# Patient Record
Sex: Male | Born: 1935 | Race: White | Hispanic: No | Smoking: Former smoker
Health system: Southern US, Community
[De-identification: ages and names within clinical notes are randomized; demographics above are authoritative.]

## PROBLEM LIST (undated history)

## (undated) DIAGNOSIS — F0390 Unspecified dementia without behavioral disturbance: Secondary | ICD-10-CM

## (undated) DIAGNOSIS — J449 Chronic obstructive pulmonary disease, unspecified: Secondary | ICD-10-CM

## (undated) DIAGNOSIS — I350 Nonrheumatic aortic (valve) stenosis: Secondary | ICD-10-CM

## (undated) DIAGNOSIS — N400 Enlarged prostate without lower urinary tract symptoms: Secondary | ICD-10-CM

## (undated) DIAGNOSIS — I4819 Other persistent atrial fibrillation: Secondary | ICD-10-CM

## (undated) DIAGNOSIS — H9190 Unspecified hearing loss, unspecified ear: Secondary | ICD-10-CM

## (undated) DIAGNOSIS — I10 Essential (primary) hypertension: Secondary | ICD-10-CM

## (undated) DIAGNOSIS — I4891 Unspecified atrial fibrillation: Secondary | ICD-10-CM

## (undated) HISTORY — PX: OTHER SURGICAL HISTORY: SHX169

## (undated) HISTORY — DX: Unspecified atrial fibrillation: I48.91

## (undated) HISTORY — DX: Nonrheumatic aortic (valve) stenosis: I35.0

## (undated) HISTORY — DX: Chronic obstructive pulmonary disease, unspecified: J44.9

## (undated) HISTORY — PX: TONSILLECTOMY: SUR1361

## (undated) HISTORY — DX: Unspecified hearing loss, unspecified ear: H91.90

---

## 2006-10-10 HISTORY — PX: CARDIOVERSION: SHX1299

## 2007-03-14 ENCOUNTER — Ambulatory Visit: Admission: RE | Admit: 2007-03-14 | Discharge: 2007-03-14 | Payer: Self-pay | Admitting: Otolaryngology

## 2007-04-18 ENCOUNTER — Ambulatory Visit (HOSPITAL_COMMUNITY): Admission: RE | Admit: 2007-04-18 | Discharge: 2007-04-18 | Payer: Self-pay | Admitting: Cardiology

## 2011-02-22 NOTE — Op Note (Signed)
NAMESAURABH, HETTICH NO.:  [id number]   MEDICAL RECORD NO.:  [id number]          PATIENT TYPE:  OIB   LOCATION:  2858                         FACILITY:  [facility]   PHYSICIAN:  Cristy Hilts. Jacinto Halim, MD       DATE OF BIRTH:  1935-05-17   DATE OF PROCEDURE:  04/18/2007  DATE OF DISCHARGE:                               OPERATIVE REPORT   PROCEDURE PERFORMED:  Direct current cardioversion.   INDICATION:  Mr. Gertie Baron is a 76 year old gentleman with history of  atrial fibrillation of unknown duration.  This was new diagnosis to this  gentleman.  History of hyperlipidemia who was has been ruled out for  myocardial ischemia by a stress test.  He has normal ejection fraction.  He was brought to the outpatient center for elective direct current  cardioversion after greater than 4 weeks of therapeutic INR.   PROCEDURE:  Using the 350 mg of intravenous Pentothal attaining deep  sedation, direct current cardioversion was performed x4.  Initially a  synchronized biphasic defibrillator was utilized delivering 100 joules  x1, and 20 joules x2, then 150 joules x1.  It was unsuccessful in spite  of these four attempts.  Hence, the patient was left alone in the atrial  fibrillation.  The patient tolerated the procedure.  No evident  complications.      Cristy Hilts. Jacinto Halim, MD  Electronically Signed     JRG/MEDQ  D:  04/18/2007  T:  04/18/2007  Job:  [id number]   cc:   Aida Puffer

## 2012-07-27 ENCOUNTER — Ambulatory Visit (INDEPENDENT_AMBULATORY_CARE_PROVIDER_SITE_OTHER): Payer: MEDICARE | Admitting: General Surgery

## 2012-07-27 ENCOUNTER — Other Ambulatory Visit (INDEPENDENT_AMBULATORY_CARE_PROVIDER_SITE_OTHER): Payer: Self-pay

## 2012-07-27 ENCOUNTER — Encounter (INDEPENDENT_AMBULATORY_CARE_PROVIDER_SITE_OTHER): Payer: Self-pay | Admitting: General Surgery

## 2012-07-27 VITALS — BP 118/82 | HR 72 | Temp 97.2°F | Resp 12 | Ht 72.0 in | Wt 236.0 lb

## 2012-07-27 DIAGNOSIS — K429 Umbilical hernia without obstruction or gangrene: Secondary | ICD-10-CM

## 2012-07-27 DIAGNOSIS — I4821 Permanent atrial fibrillation: Secondary | ICD-10-CM | POA: Insufficient documentation

## 2012-07-27 DIAGNOSIS — I4891 Unspecified atrial fibrillation: Secondary | ICD-10-CM

## 2012-07-27 HISTORY — DX: Umbilical hernia without obstruction or gangrene: K42.9

## 2012-07-27 NOTE — Patient Instructions (Addendum)
Scheduling will contact you once we receive clearance for surgery

## 2012-07-27 NOTE — Progress Notes (Signed)
Patient ID: Warren Knock., male   DOB: 1935/07/08, 77 y.o.   MRN: [id number]  Chief Complaint  Patient presents with  . Umbilical Hernia    HPI Warren Camino. is a 77 y.o. male.  Chief complaint umbilical hernia HPI Patient has a many year history of umbilical hernia.  Recently, it has begun to bother him.  He has had episodes where he has increased pain and firmness at the site.  Once, he had an episode of vomiting. He is usually able to push it back in throughout the day.  No other recent complaints. Past Medical History  Diagnosis Date  . Atrial fibrillation     No past surgical history on file.  No family history on file.  Social History History  Substance Use Topics  . Smoking status: Not on file  . Smokeless tobacco: Not on file  . Alcohol Use: Not on file    No Known Allergies  Current Outpatient Prescriptions  Medication Sig Dispense Refill  . warfarin (COUMADIN) 5 MG tablet 5 mg daily.         Review of Systems Review of Systems  Constitutional: Negative for fever, chills and unexpected weight change.  HENT: Negative for hearing loss, congestion, sore throat, trouble swallowing and voice change.   Eyes: Negative for visual disturbance.  Respiratory: Negative for cough and wheezing.   Cardiovascular: Negative for chest pain, palpitations and leg swelling.       Atrial fibrillation  Gastrointestinal: Positive for nausea, vomiting and abdominal pain. Negative for diarrhea, constipation, blood in stool, abdominal distention, anal bleeding and rectal pain.       See HPI  Genitourinary: Negative for hematuria and difficulty urinating.  Musculoskeletal: Negative for arthralgias.  Skin: Negative for rash and wound.  Neurological: Negative for seizures, syncope, weakness and headaches.  Hematological: Negative for adenopathy. Does not bruise/bleed easily.  Psychiatric/Behavioral: Negative for confusion.    Blood pressure 118/82, pulse 72, temperature  97.2 F (36.2 C), temperature source Oral, resp. rate 12, height 6' (1.829 m), weight 236 lb (107.049 kg).  Physical Exam Physical Exam  Constitutional: He is oriented to person, place, and time. He appears well-developed and well-nourished.  HENT:  Head: Normocephalic and atraumatic.  Mouth/Throat: No oropharyngeal exudate.  Eyes: Pupils are equal, round, and reactive to light. No scleral icterus.  Neck: Normal range of motion. Neck supple. No tracheal deviation present.  Cardiovascular: Normal heart sounds.   No murmur heard.      Irregular irregular rhythm  Pulmonary/Chest: Effort normal and breath sounds normal. No stridor. No respiratory distress. He has no wheezes. He has no rales.  Abdominal: Soft. Bowel sounds are normal. He exhibits no distension. There is no tenderness. There is no rebound.       Large umbilical hernia extends above umbilicus, reduces without pain, spontaneously recurs  Musculoskeletal: Normal range of motion.  Neurological: He is alert and oriented to person, place, and time.  Skin: Skin is warm.    Data Reviewed N/A  Assessment    Large umbilical hernia, increasingly symptomatic    Plan    I have offered umbilical hernia repair with mesh. Procedure, risks, and benefits D/W patient.  He is agreeable.  He will need to stop his Coumadin for 5 days prior to surgery.  We need clearance to do that from Dr. Clarene Duke, his primary care MD.  Dr. Jacinto Halim is his cardiologist but Dr. Clarene Duke is following his coumadin.  He is agreeable and  I answered his questions.       Warren Wagner E 07/27/2012, 8:42 AM

## 2012-07-30 ENCOUNTER — Telehealth (INDEPENDENT_AMBULATORY_CARE_PROVIDER_SITE_OTHER): Payer: Self-pay

## 2012-07-30 NOTE — Telephone Encounter (Signed)
I let the pt know Dr Clarene Duke gave the ok to stop his Coumadin 5 days prior to surgery.  I notified Dr Janee Morn to make sure he is ok to schedule.

## 2012-07-31 ENCOUNTER — Encounter (INDEPENDENT_AMBULATORY_CARE_PROVIDER_SITE_OTHER): Payer: Self-pay

## 2012-08-02 ENCOUNTER — Encounter (HOSPITAL_COMMUNITY): Payer: Self-pay | Admitting: Pharmacy Technician

## 2012-08-13 ENCOUNTER — Encounter (HOSPITAL_COMMUNITY): Admission: RE | Admit: 2012-08-13 | Payer: Medicare Other | Source: Ambulatory Visit

## 2012-08-17 ENCOUNTER — Encounter (HOSPITAL_COMMUNITY): Admission: RE | Payer: Self-pay | Source: Ambulatory Visit

## 2012-08-17 ENCOUNTER — Ambulatory Visit (HOSPITAL_COMMUNITY): Admission: RE | Admit: 2012-08-17 | Payer: Medicare Other | Source: Ambulatory Visit | Admitting: General Surgery

## 2012-08-17 SURGERY — REPAIR, HERNIA, UMBILICAL, ADULT
Anesthesia: General

## 2012-08-31 ENCOUNTER — Encounter (HOSPITAL_COMMUNITY): Payer: Self-pay

## 2012-08-31 ENCOUNTER — Ambulatory Visit (HOSPITAL_COMMUNITY)
Admission: RE | Admit: 2012-08-31 | Discharge: 2012-08-31 | Disposition: A | Discharge status: Home / Self Care | Payer: Medicare Other | Source: Ambulatory Visit | Attending: Anesthesiology | Admitting: Anesthesiology

## 2012-08-31 ENCOUNTER — Encounter (HOSPITAL_COMMUNITY)
Admission: RE | Admit: 2012-08-31 | Discharge: 2012-08-31 | Disposition: A | Discharge status: Home / Self Care | Payer: Medicare Other | Source: Ambulatory Visit | Attending: General Surgery | Admitting: General Surgery

## 2012-08-31 DIAGNOSIS — Z01818 Encounter for other preprocedural examination: Secondary | ICD-10-CM | POA: Insufficient documentation

## 2012-08-31 LAB — BASIC METABOLIC PANEL
CO2: 29 mEq/L (ref 19–32)
Chloride: 102 mEq/L (ref 96–112)
GFR calc Af Amer: 79 mL/min — ABNORMAL LOW (ref >90)
Potassium: 4.7 mEq/L (ref 3.5–5.1)
Sodium: 141 mEq/L (ref 135–145)

## 2012-08-31 LAB — CBC
HCT: 49.3 % (ref 39.0–52.0)
Hemoglobin: 16.6 g/dL (ref 13.0–17.0)
RBC: 5.51 MIL/uL (ref 4.22–5.81)
RDW: 12.6 % (ref 11.5–15.5)
WBC: 9.2 10*3/uL (ref 4.0–10.5)

## 2012-08-31 LAB — APTT: aPTT: 25 seconds (ref 24–37)

## 2012-08-31 LAB — PROTIME-INR: INR: 0.95 (ref 0.00–1.49)

## 2012-08-31 LAB — SURGICAL PCR SCREEN
MRSA, PCR: NEGATIVE
Staphylococcus aureus: NEGATIVE

## 2012-08-31 MED ORDER — CHLORHEXIDINE GLUCONATE 4 % EX LIQD: 1.0000 "application " | Freq: Once | CUTANEOUS | Status: DC

## 2012-08-31 NOTE — Progress Notes (Addendum)
Note from dr Jacinto Halim from 08 cardioversion. Not seen bry dr Jacinto Halim since. Followed by dr Fayrene Fearing little with respects to coumadin. Clearance by dr little in epic to stop coumadin 5 days prior to surgery. Chart given to anesthesia to review

## 2012-08-31 NOTE — Pre-Procedure Instructions (Addendum)
20 Warren Wagner.  08/31/2012   Your procedure is scheduled on:  09/10/12  Report to [facility] at 530 AM.  Call this number if you have problems the morning of surgery: [phone]   Remember:   Do not eat foodor drink:After Midnight.    Take these medicines the morning of surgery with A SIP OF WATER STOP coumadin per dr ,fish oil, multi vit 11.25.13   Do not wear jewelry,   Do not wear lotions, powders, or perfumes. You may not wear deodorant.  Do not shave 48 hours prior to surgery. Men may shave face and neck.  Do not bring valuables to the hospital.  Contacts, dentures or bridgework may not be worn into surgery.  Leave suitcase in the car. After surgery it may be brought to your room.  For patients admitted to the hospital, checkout time is 11:00 AM the day of discharge.   Patients discharged the day of surgery will not be allowed to drive home.  Name and phone number of your driver:  Tamera Reason [phone]  Special Instructions: Shower using CHG 2 nights before surgery and the night before surgery.  If you shower the day of surgery use CHG.  Use special wash - you have one bottle of CHG for all showers.  You should use approximately 1/3 of the bottle for each shower.   Please read over the following fact sheets that you were given: Pain Booklet, Coughing and Deep Breathing and MRSA Information

## 2012-09-03 NOTE — Consult Note (Signed)
Anesthesia chart review: Patient is a 77 year old male scheduled for repair of a large umbilical hernia by Dr. Janee Morn on 09/10/2012. History includes chronic atrial fibrillation diagnosed in 2008 s/p unsuccessful DCCV (Dr. Jacinto Halim), former smoker, obesity, hearing loss.  PCP is Dr. Aida Puffer.  Patient told his PAT RN that he is no longer followed by a Cardiologist.  Preoperative labs noted.  CXR on 08/31/12 showed: No acute cardiopulmonary disease. Stable mild enlargement of the cardiopericardial silhouette and ill- defined left basilar opacity which may reflect a prominent epicardial fat pad versus chronic atelectasis/scarring.   EKG on 08/31/12 showed afib, occasional PVC, anteroseptal infarct (age undetermined).  Overall, I think his EKG is stable when compared to his EKG on 11/28/11 from [facility].  By Dr. Verl Dicker notes from 04/18/07, patient was diagnosed with afib in 2008.  He subsequently had a work-up that revealed no myocardial ischemia and a normal EF.   Patient's afib is chronic and currently rate controlled, no ischemic cause was found at the time of his diagnosis in 2008.  Patient is now followed by his PCP Dr. Clarene Duke who cleared him for this procedure.  If no significant change in his status then anticipate patient can proceed as planned.    Shonna Chock, PA-C 09/03/12 [phone]

## 2012-09-09 MED ORDER — CEFAZOLIN SODIUM-DEXTROSE 2-3 GM-% IV SOLR
2.0000 g | INTRAVENOUS | Status: AC
  Administered 2012-09-10: 2 g via INTRAVENOUS
  Filled 2012-09-09: qty 50

## 2012-09-10 ENCOUNTER — Encounter (HOSPITAL_COMMUNITY): Payer: Self-pay | Admitting: Vascular Surgery

## 2012-09-10 ENCOUNTER — Encounter (HOSPITAL_COMMUNITY)
Admission: RE | Disposition: A | Discharge status: Home / Self Care | Payer: Self-pay | Source: Ambulatory Visit | Attending: General Surgery

## 2012-09-10 ENCOUNTER — Encounter (HOSPITAL_COMMUNITY): Payer: Self-pay | Admitting: General Practice

## 2012-09-10 ENCOUNTER — Ambulatory Visit (HOSPITAL_COMMUNITY): Payer: Medicare Other | Admitting: Vascular Surgery

## 2012-09-10 ENCOUNTER — Ambulatory Visit (HOSPITAL_COMMUNITY)
Admission: RE | Admit: 2012-09-10 | Discharge: 2012-09-11 | Disposition: A | Discharge status: Home / Self Care | Payer: Medicare Other | Source: Ambulatory Visit | Attending: General Surgery | Admitting: General Surgery

## 2012-09-10 DIAGNOSIS — Y9210 Unspecified residential institution as the place of occurrence of the external cause: Secondary | ICD-10-CM | POA: Insufficient documentation

## 2012-09-10 DIAGNOSIS — K420 Umbilical hernia with obstruction, without gangrene: Secondary | ICD-10-CM | POA: Insufficient documentation

## 2012-09-10 DIAGNOSIS — Z7901 Long term (current) use of anticoagulants: Secondary | ICD-10-CM | POA: Insufficient documentation

## 2012-09-10 DIAGNOSIS — K429 Umbilical hernia without obstruction or gangrene: Secondary | ICD-10-CM

## 2012-09-10 DIAGNOSIS — Y838 Other surgical procedures as the cause of abnormal reaction of the patient, or of later complication, without mention of misadventure at the time of the procedure: Secondary | ICD-10-CM | POA: Insufficient documentation

## 2012-09-10 DIAGNOSIS — I4891 Unspecified atrial fibrillation: Secondary | ICD-10-CM | POA: Insufficient documentation

## 2012-09-10 DIAGNOSIS — IMO0002 Reserved for concepts with insufficient information to code with codable children: Secondary | ICD-10-CM | POA: Insufficient documentation

## 2012-09-10 HISTORY — PX: INSERTION OF MESH: SHX5868

## 2012-09-10 HISTORY — PX: HERNIA REPAIR: SHX51

## 2012-09-10 HISTORY — PX: UMBILICAL HERNIA REPAIR: SHX196

## 2012-09-10 SURGERY — REPAIR, HERNIA, UMBILICAL, ADULT
Anesthesia: General | Site: Abdomen | Wound class: Clean

## 2012-09-10 MED ORDER — ROCURONIUM BROMIDE 100 MG/10ML IV SOLN
INTRAVENOUS | Status: DC | PRN
  Administered 2012-09-10: 50 mg via INTRAVENOUS

## 2012-09-10 MED ORDER — ONDANSETRON HCL 4 MG/2ML IJ SOLN: 4.0000 mg | Freq: Four times a day (QID) | INTRAMUSCULAR | Status: DC | PRN

## 2012-09-10 MED ORDER — FENTANYL CITRATE 0.05 MG/ML IJ SOLN
INTRAMUSCULAR | Status: DC | PRN
  Administered 2012-09-10: 100 ug via INTRAVENOUS

## 2012-09-10 MED ORDER — EPHEDRINE SULFATE 50 MG/ML IJ SOLN
INTRAMUSCULAR | Status: DC | PRN
  Administered 2012-09-10: 10 mg via INTRAVENOUS

## 2012-09-10 MED ORDER — MIDAZOLAM HCL 5 MG/5ML IJ SOLN
INTRAMUSCULAR | Status: DC | PRN
  Administered 2012-09-10 (×2): 1 mg via INTRAVENOUS

## 2012-09-10 MED ORDER — WARFARIN SODIUM 5 MG PO TABS
5.0000 mg | ORAL_TABLET | Freq: Every day | ORAL | Status: DC
Start: 2012-09-10 — End: 2012-09-11
  Administered 2012-09-10: 5 mg via ORAL
  Filled 2012-09-10 (×3): qty 1

## 2012-09-10 MED ORDER — BUPIVACAINE-EPINEPHRINE 0.5% -1:200000 IJ SOLN
INTRAMUSCULAR | Status: DC | PRN
  Administered 2012-09-10: 10 mL

## 2012-09-10 MED ORDER — GLYCOPYRROLATE 0.2 MG/ML IJ SOLN
INTRAMUSCULAR | Status: DC | PRN
  Administered 2012-09-10: .6 mg via INTRAVENOUS

## 2012-09-10 MED ORDER — ONDANSETRON HCL 4 MG/2ML IJ SOLN
INTRAMUSCULAR | Status: DC | PRN
  Administered 2012-09-10: 4 mg via INTRAVENOUS

## 2012-09-10 MED ORDER — LIDOCAINE HCL (CARDIAC) 20 MG/ML IV SOLN
INTRAVENOUS | Status: DC | PRN
  Administered 2012-09-10: 80 mg via INTRAVENOUS

## 2012-09-10 MED ORDER — OXYCODONE-ACETAMINOPHEN 5-325 MG PO TABS: 1.0000 | ORAL_TABLET | ORAL | Status: DC | PRN

## 2012-09-10 MED ORDER — 0.9 % SODIUM CHLORIDE (POUR BTL) OPTIME
TOPICAL | Status: DC | PRN
  Administered 2012-09-10: 1000 mL

## 2012-09-10 MED ORDER — OXYCODONE HCL 5 MG/5ML PO SOLN: 5.0000 mg | Freq: Once | ORAL | Status: DC | PRN

## 2012-09-10 MED ORDER — PROPOFOL 10 MG/ML IV BOLUS
INTRAVENOUS | Status: DC | PRN
  Administered 2012-09-10: 50 mg via INTRAVENOUS

## 2012-09-10 MED ORDER — HYDROMORPHONE HCL PF 1 MG/ML IJ SOLN: 1.0000 mg | INTRAMUSCULAR | Status: DC | PRN

## 2012-09-10 MED ORDER — POTASSIUM CHLORIDE 2 MEQ/ML IV SOLN
INTRAVENOUS | Status: DC
  Administered 2012-09-10: 20:00:00 via INTRAVENOUS
  Filled 2012-09-10 (×2): qty 1000

## 2012-09-10 MED ORDER — HYDROMORPHONE HCL PF 1 MG/ML IJ SOLN
0.2500 mg | INTRAMUSCULAR | Status: DC | PRN
Start: 2012-09-10 — End: 2012-09-10

## 2012-09-10 MED ORDER — LACTATED RINGERS IV SOLN
INTRAVENOUS | Status: DC | PRN
  Administered 2012-09-10 (×2): via INTRAVENOUS

## 2012-09-10 MED ORDER — ONDANSETRON HCL 4 MG PO TABS: 4.0000 mg | ORAL_TABLET | Freq: Four times a day (QID) | ORAL | Status: DC | PRN

## 2012-09-10 MED ORDER — NEOSTIGMINE METHYLSULFATE 1 MG/ML IJ SOLN
INTRAMUSCULAR | Status: DC | PRN
  Administered 2012-09-10: 4 mg via INTRAVENOUS

## 2012-09-10 MED ORDER — OXYCODONE HCL 5 MG PO TABS
5.0000 mg | ORAL_TABLET | Freq: Once | ORAL | Status: DC | PRN
Start: 2012-09-10 — End: 2012-09-10

## 2012-09-10 MED ORDER — BUPIVACAINE-EPINEPHRINE (PF) 0.5% -1:200000 IJ SOLN
INTRAMUSCULAR | Status: AC
  Filled 2012-09-10: qty 10

## 2012-09-10 MED ORDER — HEPARIN SODIUM (PORCINE) 5000 UNIT/ML IJ SOLN
5000.0000 [IU] | Freq: Three times a day (TID) | INTRAMUSCULAR | Status: DC
  Administered 2012-09-11: 5000 [IU] via SUBCUTANEOUS
  Filled 2012-09-10 (×4): qty 1

## 2012-09-10 SURGICAL SUPPLY — 45 items
ADH SKN CLS APL DERMABOND .7 (GAUZE/BANDAGES/DRESSINGS) ×1
ADH SKN CLS LQ APL DERMABOND (GAUZE/BANDAGES/DRESSINGS) ×1
BLADE SURG ROTATE 9660 (MISCELLANEOUS) ×1 IMPLANT
CANISTER SUCTION 2500CC (MISCELLANEOUS) ×1 IMPLANT
CHLORAPREP W/TINT 26ML (MISCELLANEOUS) ×2 IMPLANT
CLOTH BEACON ORANGE TIMEOUT ST (SAFETY) ×2 IMPLANT
COVER SURGICAL LIGHT HANDLE (MISCELLANEOUS) ×2 IMPLANT
DERMABOND ADHESIVE PROPEN (GAUZE/BANDAGES/DRESSINGS) ×1
DERMABOND ADVANCED (GAUZE/BANDAGES/DRESSINGS) ×1
DERMABOND ADVANCED .7 DNX12 (GAUZE/BANDAGES/DRESSINGS) ×1 IMPLANT
DERMABOND ADVANCED .7 DNX6 (GAUZE/BANDAGES/DRESSINGS) IMPLANT
DRAPE PED LAPAROTOMY (DRAPES) ×2 IMPLANT
DRAPE UTILITY 15X26 W/TAPE STR (DRAPE) ×4 IMPLANT
ELECT CAUTERY BLADE 6.4 (BLADE) ×1 IMPLANT
ELECT REM PT RETURN 9FT ADLT (ELECTROSURGICAL) ×2
ELECTRODE REM PT RTRN 9FT ADLT (ELECTROSURGICAL) ×1 IMPLANT
GLOVE BIO SURGEON STRL SZ8 (GLOVE) ×2 IMPLANT
GLOVE BIOGEL PI IND STRL 7.0 (GLOVE) IMPLANT
GLOVE BIOGEL PI IND STRL 8 (GLOVE) ×1 IMPLANT
GLOVE BIOGEL PI INDICATOR 7.0 (GLOVE) ×2
GLOVE BIOGEL PI INDICATOR 8 (GLOVE) ×1
GLOVE ECLIPSE 6.5 STRL STRAW (GLOVE) ×1 IMPLANT
GLOVE SURG SS PI 7.0 STRL IVOR (GLOVE) ×1 IMPLANT
GOWN PREVENTION PLUS XLARGE (GOWN DISPOSABLE) ×2 IMPLANT
GOWN STRL NON-REIN LRG LVL3 (GOWN DISPOSABLE) ×3 IMPLANT
KIT BASIN OR (CUSTOM PROCEDURE TRAY) ×2 IMPLANT
KIT ROOM TURNOVER OR (KITS) ×2 IMPLANT
LIGASURE IMPACT 36 18CM CVD LR (INSTRUMENTS) ×1 IMPLANT
NDL HYPO 25GX1X1/2 BEV (NEEDLE) ×1 IMPLANT
NEEDLE HYPO 25GX1X1/2 BEV (NEEDLE) ×2 IMPLANT
NS IRRIG 1000ML POUR BTL (IV SOLUTION) ×2 IMPLANT
PACK GENERAL/GYN (CUSTOM PROCEDURE TRAY) ×2 IMPLANT
PAD ARMBOARD 7.5X6 YLW CONV (MISCELLANEOUS) ×2 IMPLANT
PATCH VENTRAL MEDIUM 6.4 (Mesh Specialty) ×1 IMPLANT
SPECIMEN JAR LARGE (MISCELLANEOUS) ×1 IMPLANT
SUT MNCRL AB 4-0 PS2 18 (SUTURE) ×2 IMPLANT
SUT PROLENE 0 CT 1 30 (SUTURE) IMPLANT
SUT PROLENE 2 0 CT2 30 (SUTURE) ×3 IMPLANT
SUT VIC AB 2-0 CT1 27 (SUTURE) ×4
SUT VIC AB 2-0 CT1 TAPERPNT 27 (SUTURE) ×1 IMPLANT
SUT VIC AB 3-0 SH 27 (SUTURE) ×2
SUT VIC AB 3-0 SH 27XBRD (SUTURE) ×1 IMPLANT
SYR CONTROL 10ML LL (SYRINGE) ×2 IMPLANT
TOWEL OR 17X24 6PK STRL BLUE (TOWEL DISPOSABLE) ×2 IMPLANT
TOWEL OR 17X26 10 PK STRL BLUE (TOWEL DISPOSABLE) ×2 IMPLANT

## 2012-09-10 NOTE — Anesthesia Procedure Notes (Signed)
Procedure Name: MAC Date/Time: 09/10/2012 7:29 AM Performed by: Kirt Boys P Preoxygenation: Pre-oxygenation with 100% oxygen Intubation Type: IV induction Ventilation: Mask ventilation without difficulty and Oral airway inserted - appropriate to patient size Laryngoscope Size: Mac and 4 Grade View: Grade II Tube type: Oral Number of attempts: 2 Airway Equipment and Method: Stylet Placement Confirmation: ETT inserted through vocal cords under direct vision,  positive ETCO2 and breath sounds checked- equal and bilateral Secured at: 23 cm Tube secured with: Tape Dental Injury: Teeth and Oropharynx as per pre-operative assessment

## 2012-09-10 NOTE — Anesthesia Postprocedure Evaluation (Signed)
Anesthesia Post Note  Patient: Warren Wagner.  Procedure(s) Performed: Procedure(s) (LRB): HERNIA REPAIR UMBILICAL ADULT (N/A) INSERTION OF MESH (N/A)  Anesthesia type: General  Patient location: PACU  Post pain: Pain level controlled and Adequate analgesia  Post assessment: Post-op Vital signs reviewed, Patient's Cardiovascular Status Stable, Respiratory Function Stable, Patent Airway and Pain level controlled  Last Vitals:  Filed Vitals:   09/10/12 0835  BP:   Pulse:   Temp: 36.2 C  Resp:     Post vital signs: Reviewed and stable  Level of consciousness: awake, alert  and oriented  Complications: No apparent anesthesia complications

## 2012-09-10 NOTE — Transfer of Care (Signed)
Immediate Anesthesia Transfer of Care Note  Patient: Warren Wagner.  Procedure(s) Performed: Procedure(s) (LRB) with comments: HERNIA REPAIR UMBILICAL ADULT (N/A) INSERTION OF MESH (N/A)  Patient Location: PACU  Anesthesia Type:General  Level of Consciousness: awake, alert , oriented and patient cooperative  Airway & Oxygen Therapy: Patient Spontanous Breathing and Patient connected to nasal cannula oxygen  Post-op Assessment: Report given to PACU RN, Post -op Vital signs reviewed and stable and Patient moving all extremities X 4  Post vital signs: Reviewed and stable  Complications: No apparent anesthesia complications

## 2012-09-10 NOTE — Preoperative (Signed)
Beta Blockers   Reason not to administer Beta Blockers:Not Applicable 

## 2012-09-10 NOTE — Interval H&P Note (Signed)
History and Physical Interval Note:  09/10/2012 7:00 AM  Warren Wagner.  has presented today for surgery, with the diagnosis of Large Umbilical Hernia  The various methods of treatment have been discussed with the patient and family. After consideration of risks, benefits and other options for treatment, the patient has consented to  Procedure(s) (LRB) with comments: HERNIA REPAIR UMBILICAL ADULT (N/A) - Repair Large Umbilical Hernia with Mesh INSERTION OF MESH (N/A) as a surgical intervention .  The patient's history has been reviewed, patient re-examined, no change in status, stable for surgery.  I have reviewed the patient's chart and labs.  Questions were answered to the patient's satisfaction.     [name]

## 2012-09-10 NOTE — Op Note (Signed)
09/10/2012  8:27 AM  PATIENT:  Warren Wagner.  77 y.o. male  PRE-OPERATIVE DIAGNOSIS:  Large Umbilical Hernia  POST-OPERATIVE DIAGNOSIS:  Large Umbilical Hernia  PROCEDURE:  Procedure(s): HERNIA REPAIR UMBILICAL ADULT INSERTION OF MESH  SURGEON:  Surgeon(s): [name], MD  PHYSICIAN ASSISTANT:   ASSISTANTS: none   ANESTHESIA:   local and general  EBL:  Total I/O In: 1000 [I.V.:1000] Out: -   BLOOD ADMINISTERED:none  DRAINS: none   SPECIMEN:  Excision  DISPOSITION OF SPECIMEN:  PATHOLOGY  COUNTS:  YES  DICTATION: .Dragon Dictation  Patient presents for repair of large umbilical hernia. He was identified in the preop holding area. Informed consent was obtained. He received intravenous antibiotics. He was brought to the operating room and general endotracheal anesthesia was a Optician, dispensing by the anesthesia staff. Abdomen was prepped and draped in sterile fashion. Time out procedure was done. Infraumbilical region was infiltrated with quarter percent Marcaine with epinephrine. Infraumbilical incision was made. Subcutaneous tissues were dissected down. The hernia sac was encountered. It was circumferentially dissected. It was entered and contained only omentum. The sac was dissected free off of the umbilical skin and excised. It was discarded. The omentum was chronically incarcerated. A portion of it was excised using LigaSure. 2-0 Vicryl sutures were also used to hemostasis. That portion the omentum was sent to pathology. The remainder was checked for good hemostasis and then reduced easily back into the abdomen. The fascia was circumferentially cleared off. Hemostasis was ensured. The hernia was then repaired with a 6.4 cm proceed mesh. It was inserted and laid nicely flat against the abdominal wall.The superior and inferior leaflets were tacked to the fascia with interrupted 2-0 Prolene sutures. Some the tissues medial and lateral closed over the top of the mesh as well as  Prolene. Subcutaneous tissues were irrigated. Umbilical skin was tacked down to the tissues over the fascia using 2-0 Vicryl. There was excellent hemostasis.Subcutaneous tissues were closed with running 3-0 Vicryl. Skin was closed with running 4-0 Monocryl by Dermabond. All counts were correct.Patient was taken recovery room in stable condition. There were no apparent competitions.  PATIENT DISPOSITION:  PACU - hemodynamically stable.   Delay start of Pharmacological VTE agent (>24hrs) due to surgical blood loss or risk of bleeding:  no  Violeta Gelinas, MD, MPH, FACS Pager: [phone]  12/2/20138:27 AM

## 2012-09-10 NOTE — Progress Notes (Signed)
UR completed 

## 2012-09-10 NOTE — H&P (Signed)
Chief Complaint   Patient presents with   .  Umbilical Hernia    HPI  Warren Wagner. is a 77 y.o. male. Chief complaint umbilical hernia  HPI  Patient has a many year history of umbilical hernia. Recently, it has begun to bother him. He has had episodes where he has increased pain and firmness at the site. Once, he had an episode of vomiting. He is usually able to push it back in throughout the day. No other recent complaints.  Past Medical History   Diagnosis  Date   .  Atrial fibrillation     No past surgical history on file.  No family history on file.  Social History  History   Substance Use Topics   .  Smoking status:  Not on file   .  Smokeless tobacco:  Not on file   .  Alcohol Use:  Not on file    No Known Allergies  Current Outpatient Prescriptions   Medication  Sig  Dispense  Refill   .  warfarin (COUMADIN) 5 MG tablet  5 mg daily.      Review of Systems  Review of Systems  Constitutional: Negative for fever, chills and unexpected weight change.  HENT: Negative for hearing loss, congestion, sore throat, trouble swallowing and voice change.  Eyes: Negative for visual disturbance.  Respiratory: Negative for cough and wheezing.  Cardiovascular: Negative for chest pain, palpitations and leg swelling.  Atrial fibrillation  Gastrointestinal: Positive for nausea, vomiting and abdominal pain. Negative for diarrhea, constipation, blood in stool, abdominal distention, anal bleeding and rectal pain.  See HPI  Genitourinary: Negative for hematuria and difficulty urinating.  Musculoskeletal: Negative for arthralgias.  Skin: Negative for rash and wound.  Neurological: Negative for seizures, syncope, weakness and headaches.  Hematological: Negative for adenopathy. Does not bruise/bleed easily.  Psychiatric/Behavioral: Negative for confusion.   Blood pressure 118/82, pulse 72, temperature 97.2 F (36.2 C), temperature source Oral, resp. rate 12, height 6' (1.829 m),  weight 236 lb (107.049 kg).  Physical Exam  Physical Exam  Constitutional: He is oriented to person, place, and time. He appears well-developed and well-nourished.  HENT:  Head: Normocephalic and atraumatic.  Mouth/Throat: No oropharyngeal exudate.  Eyes: Pupils are equal, round, and reactive to light. No scleral icterus.  Neck: Normal range of motion. Neck supple. No tracheal deviation present.  Cardiovascular: Normal heart sounds.  No murmur heard. Irregular irregular rhythm  Pulmonary/Chest: Effort normal and breath sounds normal. No stridor. No respiratory distress. He has no wheezes. He has no rales.  Abdominal: Soft. Bowel sounds are normal. He exhibits no distension. There is no tenderness. There is no rebound.  Large umbilical hernia extends above umbilicus, reduces without pain, spontaneously recurs  Musculoskeletal: Normal range of motion.  Neurological: He is alert and oriented to person, place, and time.  Skin: Skin is warm.   Data Reviewed  N/A  Assessment   Large umbilical hernia, increasingly symptomatic   Plan   I have offered umbilical hernia repair with mesh. Procedure, risks, and benefits D/W patient. He is agreeable. He will need to stop his Coumadin for 5 days prior to surgery. We need clearance to do that from Dr. Clarene Duke, his primary care MD. Dr. Jacinto Halim is his cardiologist but Dr. Clarene Duke is following his coumadin. He is agreeable and I answered his questions.   Update - rescheduled due to wife's death.  He has been wearing a back brace for support. Violeta Gelinas, MD, MPH,  FACS Pager: [phone]

## 2012-09-10 NOTE — Anesthesia Preprocedure Evaluation (Addendum)
Anesthesia Evaluation  Patient identified by MRN, date of birth, ID band Patient awake    Reviewed: Allergy & Precautions, H&P , NPO status , Patient's Chart, lab work & pertinent test results  Airway Mallampati: II TM Distance: >3 FB Neck ROM: full    Dental  (+) Dental Advisory Given   Pulmonary former smoker,          Cardiovascular + dysrhythmias Atrial Fibrillation     Neuro/Psych    GI/Hepatic   Endo/Other    Renal/GU      Musculoskeletal   Abdominal   Peds  Hematology   Anesthesia Other Findings   Reproductive/Obstetrics                          Anesthesia Physical Anesthesia Plan  ASA: II  Anesthesia Plan: General   Post-op Pain Management:    Induction: Intravenous  Airway Management Planned: LMA and Oral ETT  Additional Equipment:   Intra-op Plan:   Post-operative Plan: Extubation in OR  Informed Consent: I have reviewed the patients History and Physical, chart, labs and discussed the procedure including the risks, benefits and alternatives for the proposed anesthesia with the patient or authorized representative who has indicated his/her understanding and acceptance.   Dental advisory given  Plan Discussed with: CRNA, Surgeon and Anesthesiologist  Anesthesia Plan Comments:        Anesthesia Quick Evaluation

## 2012-09-11 ENCOUNTER — Encounter (HOSPITAL_COMMUNITY): Payer: Self-pay | Admitting: General Surgery

## 2012-09-11 LAB — PROTIME-INR
INR: 1.09 (ref 0.00–1.49)
Prothrombin Time: 14 s (ref 11.6–15.2)

## 2012-09-11 MED ORDER — OXYCODONE-ACETAMINOPHEN 5-325 MG PO TABS: 1.0000 | ORAL_TABLET | Freq: Four times a day (QID) | ORAL | Status: DC | PRN

## 2012-09-11 NOTE — Progress Notes (Signed)
Patient discharged to home in care of daughter. Medications and instructions reviewed with patient and daughter and all questions answered. Dressing changed prior to discharge and supplies given to patient for daily changes. IV d/c'd with cath intact and dressing CDI. Assessment unchanged from this am. Patient is to follow up with Dr. Janee Morn on 12.18.13.

## 2012-09-11 NOTE — Progress Notes (Signed)
1 Day Post-Op  Subjective: Mild soreness, did not need pain medication  Objective: Vital signs in last 24 hours: Temp:  [97.2 F (36.2 C)-98.5 F (36.9 C)] 98.2 F (36.8 C) (12/03 0535) Pulse Rate:  [55-91] 80  (12/03 0535) Resp:  [14-18] 18  (12/03 0535) BP: (102-149)/(67-97) 102/67 mmHg (12/03 0535) SpO2:  [90 %-100 %] 98 % (12/03 0535) Weight:  [105.28 kg (232 lb 1.6 oz)] 105.28 kg (232 lb 1.6 oz) (12/03 0700) Last BM Date: 09/10/12  Intake/Output from previous day: 12/02 0701 - 12/03 0700 In: 2575 [P.O.:360; I.V.:2215] Out: -  Intake/Output this shift:    contusion of skin above umbilicus as seen in OR after dissection of sac with some seroma,, mild serosanguinous drainage on gauze, soft  Lab Results:  No results found for this basename: WBC:2,HGB:2,HCT:2,PLT:2 in the last 72 hours BMET No results found for this basename: NA:2,K:2,CL:2,CO2:2,GLUCOSE:2,BUN:2,CREATININE:2,CALCIUM:2 in the last 72 hours PT/INR  Basename 09/11/12 0550 09/10/12 1108  LABPROT 14.0 14.1  INR 1.09 1.10   ABG No results found for this basename: PHART:2,PCO2:2,PO2:2,HCO3:2 in the last 72 hours  Studies/Results: No results found.  Anti-infectives: Anti-infectives     Start     Dose/Rate Route Frequency Ordered Stop   09/09/12 1148   ceFAZolin (ANCEF) IVPB 2 g/50 mL premix        2 g 100 mL/hr over 30 Minutes Intravenous On call to O.R. 09/09/12 1148 09/10/12 0730          Assessment/Plan: s/p Procedure(s) (LRB) with comments: HERNIA REPAIR UMBILICAL ADULT (N/A) INSERTION OF MESH (N/A) Discharge  LOS: 1 day    Micajah Dennin E 09/11/2012

## 2012-09-11 NOTE — Addendum Note (Signed)
Addendum  created 09/11/12 1052 by Adair Laundry, CRNA   Modules edited:Charges VN

## 2012-09-11 NOTE — Discharge Summary (Signed)
Physician Discharge Summary  Patient ID: Warren Wagner. MRN: [id number] DOB/AGE: November 08, 1934 77 y.o.  Admit date: 09/10/2012 Discharge date: 09/11/2012  Admission Diagnoses:  Discharge Diagnoses:  Active Problems:  * No active hospital problems. *    Discharged Condition: good  Hospital Course: Patient underwent repair of large UH with mesh.  Moderate seroma.  HD stable and only mild pain post-op.  D/C home on POD#1.  Consults: None  Significant Diagnostic Studies: n/a  Treatments: surgery  Discharge Exam: Blood pressure 102/67, pulse 80, temperature 98.2 F (36.8 C), temperature source Oral, resp. rate 18, height 6' (1.829 m), weight 105.28 kg (232 lb 1.6 oz), SpO2 98.00%. see today's progress note  Disposition:   Discharge Orders    Future Appointments: Provider: Department: Dept Phone: Center:   09/26/2012 11:25 AM [name], MD [facility], [place] [phone] None     Future Orders Please Complete By Expires   Diet - low sodium heart healthy      Increase activity slowly      Discharge instructions      Comments:   CCS _______[facility]  UMBILICAL OR INGUINAL HERNIA REPAIR: POST OP INSTRUCTIONS  Always review your discharge instruction sheet given to you by the facility where your surgery was performed. IF YOU HAVE DISABILITY OR FAMILY LEAVE FORMS, YOU MUST BRING THEM TO THE OFFICE FOR PROCESSING.   DO NOT GIVE THEM TO YOUR DOCTOR.  A  prescription for pain medication may be given to you upon discharge.  Take your pain medication as prescribed, if needed.  If narcotic pain medicine is not needed, then you may take acetaminophen (Tylenol) or ibuprofen (Advil) as needed. Take your usually prescribed medications unless otherwise directed. If you need a refill on your pain medication, please contact your pharmacy.  They will contact our office to request authorization. Prescriptions will not be filled after 5 pm or on  week-ends. You should follow a light diet the first 24 hours after arrival home, such as soup and crackers, etc.  Be sure to include lots of fluids daily.  Resume your normal diet the day after surgery. Most patients will experience some swelling and bruising around the umbilicus or in the groin and scrotum.  Ice packs and reclining will help.  Swelling and bruising can take several days to resolve.  It is common to experience some constipation if taking pain medication after surgery.  Increasing fluid intake and taking a stool softener (such as Colace) will usually help or prevent this problem from occurring.  A mild laxative (Milk of Magnesia or Miralax) should be taken according to package directions if there are no bowel movements after 48 hours. Unless discharge instructions indicate otherwise, you may remove your bandages 24-48 hours after surgery, and you may shower at that time.  You may have steri-strips (small skin tapes) in place directly over the incision.  These strips should be left on the skin for 7-10 days.  If your surgeon used skin glue on the incision, you may shower in 24 hours.  The glue will flake off over the next 2-3 weeks.  Any sutures or staples will be removed at the office during your follow-up visit. ACTIVITIES:  You may resume regular (light) daily activities beginning the next day-such as daily self-care, walking, climbing stairs-gradually increasing activities as tolerated.  You may have sexual intercourse when it is comfortable.  Refrain from any heavy lifting or straining until approved by your doctor. You  may drive when you are no longer taking prescription pain medication, you can comfortably wear a seatbelt, and you can safely maneuver your car and apply brakes. RETURN TO WORK:  __________________________________________________________ [name] should see your doctor in the office for a follow-up appointment approximately 2-3 weeks after your surgery.  Make sure that you call  for this appointment within a day or two after you arrive home to insure a convenient appointment time. OTHER INSTRUCTIONS:  __________________________________________________________________________________________________________________________________________________________________________________________  WHEN TO CALL YOUR DOCTOR: Fever over 101.0 Inability to urinate Nausea and/or vomiting Extreme swelling or bruising Continued bleeding from incision. Increased pain, redness, or drainage from the incision  The clinic staff is available to answer your questions during regular business hours.  Please don't hesitate to call and ask to speak to one of the nurses for clinical concerns.  If you have a medical emergency, go to the nearest emergency room or call 911.  A surgeon from [facility] is always on call at the hospital   [address], [place], [place]  [address] ?  [address] [address], [place], [place]   [address] [phone] ? [phone] ? FAX [phone] Web site: [email or web address]   Driving Restrictions      Comments:   No driving for 1 week   Lifting restrictions      Comments:   No lifting over 10lbs for 6 weeks   Discharge wound care:      Comments:   Keep area covered with gauze and change daily until dry       Medication List     As of 09/11/2012  7:44 AM    TAKE these medications         calcium carbonate 200 MG capsule   Take 250 mg by mouth 2 (two) times daily with a meal.      fish oil-omega-3 fatty acids 1000 MG capsule   Take 1 g by mouth daily.      multivitamin with minerals tablet   Take 1 tablet by mouth daily.      oxyCODONE-acetaminophen 5-325 MG per tablet   Commonly known as: PERCOCET/ROXICET   Take 1-2 tablets by mouth every 6 (six) hours as needed.      warfarin 5 MG tablet   Commonly known as: COUMADIN   Take 5 mg by mouth daily.         SignedLiz Malady 09/11/2012, 7:44 AM

## 2012-09-26 ENCOUNTER — Ambulatory Visit (INDEPENDENT_AMBULATORY_CARE_PROVIDER_SITE_OTHER): Payer: Medicare Other | Admitting: General Surgery

## 2012-09-26 ENCOUNTER — Encounter (INDEPENDENT_AMBULATORY_CARE_PROVIDER_SITE_OTHER): Payer: Self-pay | Admitting: General Surgery

## 2012-09-26 VITALS — BP 126/88 | HR 70 | Temp 97.8°F | Resp 18 | Ht 72.0 in | Wt 228.0 lb

## 2012-09-26 DIAGNOSIS — K429 Umbilical hernia without obstruction or gangrene: Secondary | ICD-10-CM

## 2012-09-26 NOTE — Progress Notes (Signed)
Subjective:     Patient ID: Warren Knock., male   DOB: [birth year], 77 y.o.   MRN: [id number]  HPI Patient status post repair of large umbilical hernia with mesh. He's been doing well. He has been walking up to 4 miles a day. He is no longer taking pain medication.  Review of Systems     Objective:   Physical Exam Abdomen is soft and nontender, incision is well-healed without signs of infection, there is some fullness in the area where his large hernia sac was dissected out. There are no overlying skin changes.    Assessment:     Coming along well after repair of large umbilical hernia with mesh    Plan:     Continue binder as tolerated. I feel the area above will soften over time. I would like to see him back next month.

## 2012-10-24 ENCOUNTER — Encounter (INDEPENDENT_AMBULATORY_CARE_PROVIDER_SITE_OTHER): Payer: Self-pay | Admitting: General Surgery

## 2012-10-24 ENCOUNTER — Ambulatory Visit (INDEPENDENT_AMBULATORY_CARE_PROVIDER_SITE_OTHER): Payer: Medicare Other | Admitting: General Surgery

## 2012-10-24 VITALS — BP 110/82 | HR 72 | Temp 97.0°F | Resp 12 | Wt 231.0 lb

## 2012-10-24 DIAGNOSIS — K429 Umbilical hernia without obstruction or gangrene: Secondary | ICD-10-CM

## 2012-10-24 NOTE — Patient Instructions (Signed)
Continue walking. May return to normal activities including lifting Monday.

## 2012-10-24 NOTE — Progress Notes (Signed)
Subjective:     Patient ID: Warren Knock., male   DOB: 1935-03-17, 78 y.o.   MRN: [id number]  HPI Patient status post repair of large umbilical hernia on 09/10/2012. He is doing well. He is no longer taking pain medication. He still has some swelling above his umbilicus. He is walking and wears his binder then.  He occasionally has mild discomfort. He is eating and moving his bowels without difficulty.  Review of Systems     Objective:   Physical Exam On exam, his incision is well-healed, there remained some bulky palpable mass above his umbilicus. This is somewhat smaller than on his last visit. There is some overlying mild ecchymosis consistent with resolving hematoma. There is no discomfort on examination and palpation    Assessment:     Status post repair of large umbilical hernia with mesh    Plan:     Likely resolving scar tissue/hematoma. He may return to normal activities this coming Monday. I would like to see him back in 4-6 weeks to make sure this area continues to improve. We'll see him back sooner as needed.

## 2012-11-28 ENCOUNTER — Ambulatory Visit (INDEPENDENT_AMBULATORY_CARE_PROVIDER_SITE_OTHER): Payer: Medicare Other | Admitting: General Surgery

## 2012-11-28 ENCOUNTER — Encounter (INDEPENDENT_AMBULATORY_CARE_PROVIDER_SITE_OTHER): Payer: Self-pay | Admitting: General Surgery

## 2012-11-28 VITALS — BP 110/78 | HR 78 | Temp 96.6°F | Resp 12 | Ht 72.0 in | Wt 232.5 lb

## 2012-11-28 DIAGNOSIS — K429 Umbilical hernia without obstruction or gangrene: Secondary | ICD-10-CM

## 2012-11-28 NOTE — Progress Notes (Signed)
Subjective:     Patient ID: Warren Knock., male   DOB: 03-16-1935, 78 y.o.   MRN: [id number]  HPI Patient is status post large umbilical hernia repair with mesh.  On followup he seemed to have a hematoma above his umbilicus. This has not resolved. He is eating fine. He is moving his bowels without difficulty. Is not having pain. Denies skin changes.  Review of Systems     Objective:   Physical Exam Abdomen is soft and nontender. Indeed, he is a supraumbilical mass over the hernia repair site. It has not resolved and may be slightly larger. It does not reduce.No overlying skin changes. The infraumbilical incision remains clean dry and intact. There is no evidence of cellulitis or any infection.    Assessment:     Supraumbilical mass status post repair of large umbilical hernia. Possible recurrence containing omental fat versus hematoma    Plan:     CT scan abdomen and pelvis. We'll arrange and evaluate afterwards. He is hoping to avoid further surgery. We spoke at length regarding possibilities and plans.

## 2012-11-30 ENCOUNTER — Ambulatory Visit
Admission: RE | Admit: 2012-11-30 | Discharge: 2012-11-30 | Disposition: A | Discharge status: Home / Self Care | Payer: Medicare Other | Source: Ambulatory Visit | Attending: General Surgery | Admitting: General Surgery

## 2012-11-30 DIAGNOSIS — K429 Umbilical hernia without obstruction or gangrene: Secondary | ICD-10-CM

## 2012-11-30 MED ORDER — IOHEXOL 300 MG/ML  SOLN
125.0000 mL | Freq: Once | INTRAMUSCULAR | Status: AC | PRN
  Administered 2012-11-30: 125 mL via INTRAVENOUS

## 2012-12-03 ENCOUNTER — Telehealth (INDEPENDENT_AMBULATORY_CARE_PROVIDER_SITE_OTHER): Payer: Self-pay

## 2012-12-03 NOTE — Telephone Encounter (Signed)
I called the pt and let him know that his ct showed no recurrent hernia but a seroma.  I explained to him what this is and that Dr Janee Morn can remove the fluid in the office if it's bothering him.  The pt said it bothers him some and would like to try that.  We will work on a time and let him know when to come in.

## 2012-12-03 NOTE — Telephone Encounter (Signed)
After speaking to Dr Janee Morn, he offered to see him tomorrow at 4pm.  I confirmed with the pt and he will come in then.

## 2012-12-04 ENCOUNTER — Ambulatory Visit (INDEPENDENT_AMBULATORY_CARE_PROVIDER_SITE_OTHER): Payer: Medicare Other | Admitting: General Surgery

## 2012-12-04 ENCOUNTER — Encounter (INDEPENDENT_AMBULATORY_CARE_PROVIDER_SITE_OTHER): Payer: Self-pay | Admitting: General Surgery

## 2012-12-04 VITALS — BP 126/82 | HR 76 | Temp 98.3°F | Resp 16 | Ht 72.0 in | Wt 234.4 lb

## 2012-12-04 DIAGNOSIS — T888XXA Other specified complications of surgical and medical care, not elsewhere classified, initial encounter: Secondary | ICD-10-CM

## 2012-12-04 DIAGNOSIS — IMO0002 Reserved for concepts with insufficient information to code with codable children: Secondary | ICD-10-CM | POA: Insufficient documentation

## 2012-12-04 NOTE — Progress Notes (Signed)
Subjective:     Patient ID: Warren Wagner., male   DOB: 1935/04/03, 78 y.o.   MRN: [id number]  HPI Patient with CT scan of the abdomen and pelvis to evaluate supraumbilical fluid collection after repair of large umbilical hernia with mesh. There is no evidence of hernia recurrence. This appears benign, fluid collection. He presents for aspiration.  Review of Systems     Objective:   Physical Exam Abdomen is soft and nontender. Supraumbilical fluid collection is unchanged. No overlying erythema. No cellulitis. Procedure: The area was prepped in a sterile fashion. 1% lidocaine with epinephrine was injected for local anesthesia. 18-gauge needle was then inserted evacuating a 30 cc liquefied hematoma. Fluid did not appear cloudy. There is no purulence.    Assessment:     Hematoma status post repair of large umbilical hernia with mesh, likely due to anticoagulation with Coumadin    Plan:     Aspirated as above, pressure dressing was applied. He will return if it recurs.

## 2014-08-23 ENCOUNTER — Emergency Department (HOSPITAL_COMMUNITY)
Admission: EM | Admit: 2014-08-23 | Discharge: 2014-08-23 | Disposition: A | Discharge status: Home / Self Care | Payer: Medicare Other | Attending: Emergency Medicine | Admitting: Emergency Medicine

## 2014-08-23 ENCOUNTER — Encounter (HOSPITAL_COMMUNITY): Payer: Self-pay

## 2014-08-23 DIAGNOSIS — Z87891 Personal history of nicotine dependence: Secondary | ICD-10-CM | POA: Insufficient documentation

## 2014-08-23 DIAGNOSIS — Z7901 Long term (current) use of anticoagulants: Secondary | ICD-10-CM | POA: Diagnosis not present

## 2014-08-23 DIAGNOSIS — R509 Fever, unspecified: Secondary | ICD-10-CM | POA: Diagnosis not present

## 2014-08-23 DIAGNOSIS — H9190 Unspecified hearing loss, unspecified ear: Secondary | ICD-10-CM | POA: Diagnosis not present

## 2014-08-23 DIAGNOSIS — I4891 Unspecified atrial fibrillation: Secondary | ICD-10-CM | POA: Insufficient documentation

## 2014-08-23 DIAGNOSIS — N390 Urinary tract infection, site not specified: Secondary | ICD-10-CM | POA: Diagnosis not present

## 2014-08-23 DIAGNOSIS — Z79899 Other long term (current) drug therapy: Secondary | ICD-10-CM | POA: Insufficient documentation

## 2014-08-23 DIAGNOSIS — R350 Frequency of micturition: Secondary | ICD-10-CM | POA: Diagnosis present

## 2014-08-23 LAB — BASIC METABOLIC PANEL
Anion gap: 13 (ref 5–15)
BUN: 18 mg/dL (ref 6–23)
CALCIUM: 9.3 mg/dL (ref 8.4–10.5)
CO2: 25 meq/L (ref 19–32)
Chloride: 100 mEq/L (ref 96–112)
Creatinine, Ser: 1.16 mg/dL (ref 0.50–1.35)
GFR calc Af Amer: 68 mL/min — ABNORMAL LOW (ref >90)
GFR calc non Af Amer: 59 mL/min — ABNORMAL LOW (ref >90)
GLUCOSE: 135 mg/dL — AB (ref 70–99)
Potassium: 4.8 mEq/L (ref 3.7–5.3)
SODIUM: 138 meq/L (ref 137–147)

## 2014-08-23 LAB — CBC WITH DIFFERENTIAL/PLATELET
Basophils Absolute: 0 10*3/uL (ref 0.0–0.1)
Basophils Relative: 0 % (ref 0–1)
EOS PCT: 0 % (ref 0–5)
Eosinophils Absolute: 0 10*3/uL (ref 0.0–0.7)
HEMATOCRIT: 46 % (ref 39.0–52.0)
HEMOGLOBIN: 15.4 g/dL (ref 13.0–17.0)
LYMPHS ABS: 0.5 10*3/uL — AB (ref 0.7–4.0)
LYMPHS PCT: 3 % — AB (ref 12–46)
MCH: 30.1 pg (ref 26.0–34.0)
MCHC: 33.5 g/dL (ref 30.0–36.0)
MCV: 89.8 fL (ref 78.0–100.0)
MONOS PCT: 9 % (ref 3–12)
Monocytes Absolute: 1.3 10*3/uL — ABNORMAL HIGH (ref 0.1–1.0)
Neutro Abs: 13.6 10*3/uL — ABNORMAL HIGH (ref 1.7–7.7)
Neutrophils Relative %: 88 % — ABNORMAL HIGH (ref 43–77)
PLATELETS: 137 10*3/uL — AB (ref 150–400)
RBC: 5.12 MIL/uL (ref 4.22–5.81)
RDW: 12.6 % (ref 11.5–15.5)
WBC: 15.4 10*3/uL — AB (ref 4.0–10.5)

## 2014-08-23 LAB — URINALYSIS, ROUTINE W REFLEX MICROSCOPIC
BILIRUBIN URINE: NEGATIVE
GLUCOSE, UA: NEGATIVE mg/dL
Ketones, ur: NEGATIVE mg/dL
Nitrite: POSITIVE — AB
PH: 5.5 (ref 5.0–8.0)
Protein, ur: 100 mg/dL — AB
SPECIFIC GRAVITY, URINE: 1.025 (ref 1.005–1.030)
UROBILINOGEN UA: 1 mg/dL (ref 0.0–1.0)

## 2014-08-23 LAB — URINE MICROSCOPIC-ADD ON

## 2014-08-23 MED ORDER — CEPHALEXIN 500 MG PO CAPS: 500.0000 mg | ORAL_CAPSULE | Freq: Four times a day (QID) | ORAL | Status: DC

## 2014-08-23 MED ORDER — DEXTROSE 5 % IV SOLN
1.0000 g | Freq: Once | INTRAVENOUS | Status: AC
  Administered 2014-08-23: 1 g via INTRAVENOUS
  Filled 2014-08-23: qty 10

## 2014-08-23 MED ORDER — SODIUM CHLORIDE 0.9 % IV BOLUS (SEPSIS)
1000.0000 mL | Freq: Once | INTRAVENOUS | Status: AC
  Administered 2014-08-23: 1000 mL via INTRAVENOUS

## 2014-08-23 NOTE — ED Notes (Signed)
Pt to be discharged once fluids completed, per verbal order from PA

## 2014-08-23 NOTE — ED Notes (Addendum)
Pt states weakness and increase urination.  Symptoms started on Friday.  Feels like he isn't emptying his bladder.  Pt states he had fever yesterday but no fever today.  Also having burning with urination.

## 2014-08-23 NOTE — Discharge Instructions (Signed)

## 2014-08-23 NOTE — ED Provider Notes (Signed)
CSN: [id number]     Arrival date & time 08/23/14  1228 History   First MD Initiated Contact with Patient 08/23/14 1503     Chief Complaint  Patient presents with  . Weakness  . Urinary Frequency     (Consider location/radiation/quality/duration/timing/severity/associated sxs/prior Treatment) HPI Comments: Patient with history of A-fib on coumadin, presents to the ED with a chief complaint of dysuria, frequency, and fatigue.  Patient states that his symptoms started yesterday.  He states that he has a moderate amount of pain with urination.  He reports having to urinate more frequently than normal, but feels like he is not emptying his bladder completely. He also complains of running a fever to 101 yesterday, but states that he took aspirin with relief. He denies any abdominal pain, back pain, or flank pain. He denies any nausea, vomiting, or diarrhea.  States that he has had a UTI in the past.  He denies any chest pain, SOB, or other health problems.  The history is provided by the patient. No language interpreter was used.    Past Medical History  Diagnosis Date  . Hearing loss   . Atrial fibrillation    Past Surgical History  Procedure Laterality Date  . Cardioversion  08  . Hernia repair  09/10/2012    large umbilical hernia  . Tonsillectomy      "maybe" (09/10/2012)  . Umbilical hernia repair  09/10/2012    Procedure: HERNIA REPAIR UMBILICAL ADULT;  Surgeon: Liz MaladyBurke E Thompson, MD;  [facility];  Service: General;  Laterality: N/A;  . Insertion of mesh  09/10/2012    Procedure: INSERTION OF MESH;  Surgeon: Liz MaladyBurke E Thompson, MD;  [facility];  Service: General;  Laterality: N/A;   History reviewed. No pertinent family history. History  Substance Use Topics  . Smoking status: Former Smoker -- 1.50 packs/day for 10 years    Types: Cigarettes    Quit date: 07/27/1982  . Smokeless tobacco: Never Used  . Alcohol Use: No    Review of Systems  Constitutional: Positive for  fever. Negative for chills.  Respiratory: Negative for shortness of breath.   Cardiovascular: Negative for chest pain.  Gastrointestinal: Negative for nausea, vomiting, diarrhea and constipation.  Genitourinary: Positive for dysuria and frequency.  All other systems reviewed and are negative.     Allergies  Review of patient's allergies indicates no known allergies.  Home Medications   Prior to Admission medications   Medication Sig Start Date End Date Taking? Authorizing Provider  aspirin EC 81 MG tablet Take 81 mg by mouth every 4 (four) hours as needed for fever.    Yes Historical Provider, MD  fish oil-omega-3 fatty acids 1000 MG capsule Take 1 g by mouth daily.   Yes Historical Provider, MD  Multiple Vitamins-Minerals (MULTIVITAMIN WITH MINERALS) tablet Take 1 tablet by mouth daily.   Yes Historical Provider, MD  warfarin (COUMADIN) 5 MG tablet Take 5 mg by mouth daily.  07/07/12  Yes Historical Provider, MD   BP 133/70 mmHg  Pulse 106  Temp(Src) 98.8 F (37.1 C) (Oral)  Resp 20  SpO2 93% Physical Exam  Constitutional: He is oriented to person, place, and time. He appears well-developed and well-nourished.  HENT:  Head: Normocephalic and atraumatic.  Eyes: Conjunctivae and EOM are normal. Pupils are equal, round, and reactive to light. Right eye exhibits no discharge. Left eye exhibits no discharge. No scleral icterus.  Neck: Normal range of motion. Neck supple. No JVD present.  Cardiovascular: Normal rate, regular rhythm and normal heart sounds.  Exam reveals no gallop and no friction rub.   No murmur heard. Normal rate on my exam  Pulmonary/Chest: Effort normal and breath sounds normal. No respiratory distress. He has no wheezes. He has no rales. He exhibits no tenderness.  Abdominal: Soft. He exhibits no distension and no mass. There is no tenderness. There is no rebound and no guarding.  No focal abdominal tenderness, no RLQ tenderness or pain at McBurney's point, no  RUQ tenderness or Murphy's sign, no left-sided abdominal tenderness, no fluid wave, or signs of peritonitis   Musculoskeletal: Normal range of motion. He exhibits no edema or tenderness.  Neurological: He is alert and oriented to person, place, and time.  Skin: Skin is warm and dry.  Psychiatric: He has a normal mood and affect. His behavior is normal. Judgment and thought content normal.  Nursing note and vitals reviewed.   ED Course  Procedures (including critical care time) Results for orders placed or performed during the hospital encounter of 08/23/14  Urinalysis, Routine w reflex microscopic  Result Value Ref Range   Color, Urine AMBER (A) YELLOW   APPearance CLOUDY (A) CLEAR   Specific Gravity, Urine 1.025 1.005 - 1.030   pH 5.5 5.0 - 8.0   Glucose, UA NEGATIVE NEGATIVE mg/dL   Hgb urine dipstick LARGE (A) NEGATIVE   Bilirubin Urine NEGATIVE NEGATIVE   Ketones, ur NEGATIVE NEGATIVE mg/dL   Protein, ur 409100 (A) NEGATIVE mg/dL   Urobilinogen, UA 1.0 0.0 - 1.0 mg/dL   Nitrite POSITIVE (A) NEGATIVE   Leukocytes, UA MODERATE (A) NEGATIVE  CBC with Differential  Result Value Ref Range   WBC 15.4 (H) 4.0 - 10.5 K/uL   RBC 5.12 4.22 - 5.81 MIL/uL   Hemoglobin 15.4 13.0 - 17.0 g/dL   HCT 81.146.0 91.439.0 - 78.252.0 %   MCV 89.8 78.0 - 100.0 fL   MCH 30.1 26.0 - 34.0 pg   MCHC 33.5 30.0 - 36.0 g/dL   RDW 95.612.6 21.311.5 - 08.615.5 %   Platelets 137 (L) 150 - 400 K/uL   Neutrophils Relative % 88 (H) 43 - 77 %   Neutro Abs 13.6 (H) 1.7 - 7.7 K/uL   Lymphocytes Relative 3 (L) 12 - 46 %   Lymphs Abs 0.5 (L) 0.7 - 4.0 K/uL   Monocytes Relative 9 3 - 12 %   Monocytes Absolute 1.3 (H) 0.1 - 1.0 K/uL   Eosinophils Relative 0 0 - 5 %   Eosinophils Absolute 0.0 0.0 - 0.7 K/uL   Basophils Relative 0 0 - 1 %   Basophils Absolute 0.0 0.0 - 0.1 K/uL  Basic metabolic panel  Result Value Ref Range   Sodium 138 137 - 147 mEq/L   Potassium 4.8 3.7 - 5.3 mEq/L   Chloride 100 96 - 112 mEq/L   CO2 25 19 - 32  mEq/L   Glucose, Bld 135 (H) 70 - 99 mg/dL   BUN 18 6 - 23 mg/dL   Creatinine, Ser 5.781.16 0.50 - 1.35 mg/dL   Calcium 9.3 8.4 - 46.910.5 mg/dL   GFR calc non Af Amer 59 (L) >90 mL/min   GFR calc Af Amer 68 (L) >90 mL/min   Anion gap 13 5 - 15  Urine microscopic-add on  Result Value Ref Range   Squamous Epithelial / LPF RARE RARE   WBC, UA TOO NUMEROUS TO COUNT <3 WBC/hpf   RBC / HPF 21-50 <3 RBC/hpf  Bacteria, UA MANY (A) RARE   No results found.    EKG Interpretation None      MDM   Final diagnoses:  UTI (lower urinary tract infection)    Patient with UTI.  Afebrile.  Non-toxic appearing.  Will treat with rocephin and discharge on keflex.  Bladder scan shows post-void residual volume of 55-97 ml on 5 attempts.  No evidence for urinary retention.  4:40 PM Patient given fluids and rocephin.  Patient seen by and discussed with Dr. Patria Mane.  Prior CT from 1 year ago is reviewed.  No kidney stones on prior CT.  Patient has symptoms consistent with UTI.  No flank pain or back pain.  Will discharge with keflex x 1 week.  DC to home with primary care follow-up.  Roxy Horseman, PA-C 08/23/14 1644  Lyanne Co, MD 08/23/14 [phone]

## 2014-08-23 NOTE — ED Notes (Signed)
Bladder scan showed 97 ml. PA notified.

## 2014-08-25 LAB — URINE CULTURE

## 2014-08-26 ENCOUNTER — Telehealth (HOSPITAL_BASED_OUTPATIENT_CLINIC_OR_DEPARTMENT_OTHER): Payer: Self-pay | Admitting: Emergency Medicine

## 2014-08-26 NOTE — Telephone Encounter (Signed)
Post ED Visit - Positive Culture Follow-up  Culture report reviewed by antimicrobial stewardship pharmacist: []  Warren Wagner, Pharm.D., BCPS []  Warren Wagner, Pharm.D., BCPS []  Warren Wagner, Pharm.D., BCPS []  Sentinel ButteMinh Wagner, [address] BoulevardPharm[address]D., BCPS, AAHIVP []  Warren Wagner, Pharm.D., BCPS, AAHIVP [x]  Warren Wagner, [address]Pharm[address]   Positive urine culture  E. Coli Treated with cephalexin, organism sensitive to the same and no further patient follow-up is required at this time.  Warren Wagner, Warren Wagner 08/26/2014, 2:17 PM

## 2015-06-26 ENCOUNTER — Other Ambulatory Visit: Payer: Self-pay | Admitting: Family Medicine

## 2015-06-26 DIAGNOSIS — N5089 Other specified disorders of the male genital organs: Secondary | ICD-10-CM

## 2015-06-30 ENCOUNTER — Ambulatory Visit
Admission: RE | Admit: 2015-06-30 | Discharge: 2015-06-30 | Disposition: A | Discharge status: Home / Self Care | Payer: Medicare Other | Source: Ambulatory Visit | Attending: Family Medicine | Admitting: Family Medicine

## 2015-06-30 DIAGNOSIS — N5089 Other specified disorders of the male genital organs: Secondary | ICD-10-CM

## 2019-05-28 ENCOUNTER — Other Ambulatory Visit: Payer: Self-pay

## 2019-05-28 ENCOUNTER — Ambulatory Visit
Admission: EM | Admit: 2019-05-28 | Discharge: 2019-05-28 | Disposition: A | Discharge status: Home / Self Care | Payer: Medicare Other | Attending: Physician Assistant | Admitting: Physician Assistant

## 2019-05-28 DIAGNOSIS — L739 Follicular disorder, unspecified: Secondary | ICD-10-CM

## 2019-05-28 MED ORDER — MUPIROCIN 2 % EX OINT: 1.0000 "application " | TOPICAL_OINTMENT | Freq: Two times a day (BID) | CUTANEOUS | 0 refills | Status: DC

## 2019-05-28 NOTE — ED Triage Notes (Signed)
Pt c/o rash to rt wrist x1week with blister popping up

## 2019-05-28 NOTE — Discharge Instructions (Signed)
Start bactroban as directed. Warm compress 15-20 mins at a time. Monitor for spreading redness, increase warmth, fever, pain, follow up for reevaluation needed.

## 2019-05-28 NOTE — ED Provider Notes (Signed)
[facility]    CSN: [id number] Arrival date & time: 05/28/19  1419     History   Chief Complaint Chief Complaint  Patient presents with  . Rash    HPI Warren Wagner. is a 84 y.o. male.   84 year old male with history of afib on coumadin comes in with daughter for 1 week history of rash to the right wrist. Denies pain, itching, burning. Has surrounding erythema without spreading, warmth. Denies fever, chills. Denies changes in hygiene product. Has not tried anything for the symptoms.      Past Medical History:  Diagnosis Date  . Atrial fibrillation ([place])   . Hearing loss     Patient Active Problem List   Diagnosis Date Noted  . Hematoma complicating a procedure 12/04/2012  . Umbilical hernia 08/65/7846  . Atrial fibrillation ([place]) 07/27/2012    Past Surgical History:  Procedure Laterality Date  . CARDIOVERSION  08  . HERNIA REPAIR  96/11/9526   large umbilical hernia  . INSERTION OF MESH  09/10/2012   Procedure: INSERTION OF MESH;  Surgeon: Zenovia Jarred, MD;  Location: [place];  Service: General;  Laterality: N/A;  . TONSILLECTOMY     "maybe" (09/10/2012)  . UMBILICAL HERNIA REPAIR  09/10/2012   Procedure: HERNIA REPAIR UMBILICAL ADULT;  Surgeon: Zenovia Jarred, MD;  Location: [place];  Service: General;  Laterality: N/A;       Home Medications    Prior to Admission medications   Medication Sig Start Date End Date Taking? Authorizing Provider  fish oil-omega-3 fatty acids 1000 MG capsule Take 1 g by mouth daily.    [provider]  Multiple Vitamins-Minerals (MULTIVITAMIN WITH MINERALS) tablet Take 1 tablet by mouth daily.    [provider]  mupirocin ointment (BACTROBAN) 2 % Apply 1 application topically 2 (two) times daily. 05/28/19   Tasia Catchings, Antha Niday V, PA-C  warfarin (COUMADIN) 5 MG tablet Take 5 mg by mouth daily.  07/07/12   [provider]    Family History No family history on file.  Social History Social  History   Tobacco Use  . Smoking status: Former Smoker    Packs/day: 1.50    Years: 10.00    Pack years: 15.00    Types: Cigarettes    Quit date: 07/27/1982    Years since quitting: 36.8  . Smokeless tobacco: Never Used  Substance Use Topics  . Alcohol use: No  . Drug use: No     Allergies   Patient has no known allergies.   Review of Systems Review of Systems  Reason unable to perform ROS: See HPI as above.     Physical Exam Triage Vital Signs ED Triage Vitals [05/28/19 1433]  Enc Vitals Group     BP (!) 147/85     Pulse Rate 91     Resp 20     Temp 98 F (36.7 C)     Temp Source Oral     SpO2 93 %     Weight      Height      Head Circumference      Peak Flow      Pain Score 0     Pain Loc      Pain Edu?      Excl. in [place]?    No data found.  Updated Vital Signs BP (!) 147/85 (BP Location: Left Arm)   Pulse 91   Temp 98 F (36.7  C) (Oral)   Resp 20   SpO2 93%   Physical Exam Constitutional:      General: He is not in acute distress.    Appearance: He is well-developed. He is not ill-appearing, toxic-appearing or diaphoretic.  HENT:     Head: Normocephalic and atraumatic.  Eyes:     Conjunctiva/sclera: Conjunctivae normal.     Pupils: Pupils are equal, round, and reactive to light.  Cardiovascular:     Rate and Rhythm: Normal rate and regular rhythm.  Pulmonary:     Effort: Pulmonary effort is normal. No respiratory distress.     Breath sounds: Normal breath sounds.     Comments: Lungs clear to auscultation bilaterally without adventitious lung sounds. Skin:    General: Skin is warm and dry.     Comments: Rash to the radial wrist. One folliculitis with surrounding erythema. Few scars seen with surrounding erythema, no warmth, induration. No tenderness to palpation.   Neurological:     Mental Status: He is alert and oriented to person, place, and time.      UC Treatments / Results  Labs (all labs ordered are listed, but only abnormal  results are displayed) Labs Reviewed - No data to display  EKG   Radiology No results found.  Procedures Procedures (including critical care time)  Medications Ordered in UC Medications - No data to display  Initial Impression / Assessment and Plan / UC Course  I have reviewed the triage vital signs and the nursing notes.  Pertinent labs & imaging results that were available during my care of the patient were reviewed by me and considered in my medical decision making (see chart for details).    Discussed possible folliculitis causing symptoms. Will start bactroban as directed. Avoid new hygiene product. Warm compress as discussed. Return precautions given. Patient expresses understanding and agrees to plan.  Final Clinical Impressions(s) / UC Diagnoses   Final diagnoses:  Folliculitis   ED Prescriptions    Medication Sig Dispense Auth. Provider   mupirocin ointment (BACTROBAN) 2 % Apply 1 application topically 2 (two) times daily. 22 g Threasa AlphaYu, Bert Givans V, PA-C        Breanna Shorkey V, [place] JerseyPA-C 05/28/19 1454

## 2019-11-02 ENCOUNTER — Emergency Department (HOSPITAL_BASED_OUTPATIENT_CLINIC_OR_DEPARTMENT_OTHER): Payer: Medicare Other

## 2019-11-02 ENCOUNTER — Other Ambulatory Visit: Payer: Self-pay

## 2019-11-02 ENCOUNTER — Encounter (HOSPITAL_BASED_OUTPATIENT_CLINIC_OR_DEPARTMENT_OTHER): Payer: Self-pay

## 2019-11-02 ENCOUNTER — Emergency Department (HOSPITAL_BASED_OUTPATIENT_CLINIC_OR_DEPARTMENT_OTHER)
Admission: EM | Admit: 2019-11-02 | Discharge: 2019-11-02 | Disposition: A | Discharge status: Home / Self Care | Payer: Medicare Other | Attending: Emergency Medicine | Admitting: Emergency Medicine

## 2019-11-02 DIAGNOSIS — Z79899 Other long term (current) drug therapy: Secondary | ICD-10-CM | POA: Insufficient documentation

## 2019-11-02 DIAGNOSIS — L0390 Cellulitis, unspecified: Secondary | ICD-10-CM

## 2019-11-02 DIAGNOSIS — R2242 Localized swelling, mass and lump, left lower limb: Secondary | ICD-10-CM | POA: Diagnosis present

## 2019-11-02 DIAGNOSIS — Z7901 Long term (current) use of anticoagulants: Secondary | ICD-10-CM | POA: Insufficient documentation

## 2019-11-02 DIAGNOSIS — L03116 Cellulitis of left lower limb: Secondary | ICD-10-CM | POA: Diagnosis not present

## 2019-11-02 DIAGNOSIS — Z87891 Personal history of nicotine dependence: Secondary | ICD-10-CM | POA: Diagnosis not present

## 2019-11-02 DIAGNOSIS — R600 Localized edema: Secondary | ICD-10-CM

## 2019-11-02 LAB — CBC WITH DIFFERENTIAL/PLATELET
Abs Immature Granulocytes: 0.02 10*3/uL (ref 0.00–0.07)
Basophils Absolute: 0.1 10*3/uL (ref 0.0–0.1)
Basophils Relative: 1 %
Eosinophils Absolute: 0.3 10*3/uL (ref 0.0–0.5)
Eosinophils Relative: 4 %
HCT: 49.5 % (ref 39.0–52.0)
Hemoglobin: 15.9 g/dL (ref 13.0–17.0)
Immature Granulocytes: 0 %
Lymphocytes Relative: 20 %
Lymphs Abs: 1.6 10*3/uL (ref 0.7–4.0)
MCH: 29.8 pg (ref 26.0–34.0)
MCHC: 32.1 g/dL (ref 30.0–36.0)
MCV: 92.7 fL (ref 80.0–100.0)
Monocytes Absolute: 0.8 10*3/uL (ref 0.1–1.0)
Monocytes Relative: 10 %
Neutro Abs: 5.2 10*3/uL (ref 1.7–7.7)
Neutrophils Relative %: 65 %
Platelets: 181 10*3/uL (ref 150–400)
RBC: 5.34 MIL/uL (ref 4.22–5.81)
RDW: 13.1 % (ref 11.5–15.5)
WBC: 7.9 10*3/uL (ref 4.0–10.5)
nRBC: 0 % (ref 0.0–0.2)

## 2019-11-02 LAB — COMPREHENSIVE METABOLIC PANEL
ALT: 31 U/L (ref 0–44)
AST: 35 U/L (ref 15–41)
Albumin: 3.9 g/dL (ref 3.5–5.0)
Alkaline Phosphatase: 73 U/L (ref 38–126)
Anion gap: 5 (ref 5–15)
BUN: 23 mg/dL (ref 8–23)
CO2: 27 mmol/L (ref 22–32)
Calcium: 9.2 mg/dL (ref 8.9–10.3)
Chloride: 106 mmol/L (ref 98–111)
Creatinine, Ser: 1.3 mg/dL — ABNORMAL HIGH (ref 0.61–1.24)
GFR calc Af Amer: 59 mL/min — ABNORMAL LOW (ref >60)
GFR calc non Af Amer: 51 mL/min — ABNORMAL LOW (ref >60)
Glucose, Bld: 113 mg/dL — ABNORMAL HIGH (ref 70–99)
Potassium: 5.1 mmol/L (ref 3.5–5.1)
Sodium: 138 mmol/L (ref 135–145)
Total Bilirubin: 0.7 mg/dL (ref 0.3–1.2)
Total Protein: 7.1 g/dL (ref 6.5–8.1)

## 2019-11-02 LAB — BRAIN NATRIURETIC PEPTIDE: B Natriuretic Peptide: 84.3 pg/mL (ref 0.0–100.0)

## 2019-11-02 LAB — PROTIME-INR
INR: 1.8 — ABNORMAL HIGH (ref 0.8–1.2)
Prothrombin Time: 21.2 seconds — ABNORMAL HIGH (ref 11.4–15.2)

## 2019-11-02 LAB — TROPONIN I (HIGH SENSITIVITY): Troponin I (High Sensitivity): 9 ng/L (ref <18)

## 2019-11-02 MED ORDER — CLINDAMYCIN HCL 300 MG PO CAPS: 300.0000 mg | ORAL_CAPSULE | Freq: Three times a day (TID) | ORAL | 0 refills | Status: AC

## 2019-11-02 NOTE — Discharge Instructions (Addendum)
Your chest x-ray showed no obvious infection, there might be trace/small pleural effusion (fluid in lung) but blood work showed no major signs of heart failure.  I recommend that you follow-up with your primary care doctor for an echocardiogram of your heart for further work-up.  DVT study showed no blood clot in your left leg.  I suspect that you have some venous stasis disease in your lower legs and now you have developed a skin infection in the superficial part of your left leg and will start antibiotics.  Your INR was 1.8 today.  Please continue your medication/coumadin and have it rechecked with your primary care doctor on Monday as well.  Please return to the ED if you have any worsening symptoms.  No concern for systemic infection at this time.

## 2019-11-02 NOTE — ED Provider Notes (Addendum)
[facility] EMERGENCY DEPARTMENT Provider Note   CSN: [id number] Arrival date & time: 11/02/19  1636     History Chief Complaint  Patient presents with  . Leg Swelling    Warren Wagner Warren Kutzer. is a 85 y.o. male.  The history is provided by the patient.  Illness Location:  Left leg Quality:  Swelling Severity:  Mild Onset quality:  Gradual Timing:  Constant Progression:  Worsening Chronicity:  New Context:  Left leg swelling for weeks but worsening redness to shin area. Right leg swelling as well during that time but not as bad. No major SOB. On warfarin for afib. No hx of clots. No diabetes. Relieved by:  Nothing Worsened by:  Nothing  Associated symptoms: no abdominal pain, no chest pain, no congestion, no cough, no ear pain, no fever, no rash, no shortness of breath, no sore throat and no vomiting        Past Medical History:  Diagnosis Date  . Atrial fibrillation (HCC)   . Hearing loss     Patient Active Problem List   Diagnosis Date Noted  . Hematoma complicating a procedure 12/04/2012  . Umbilical hernia 07/27/2012  . Atrial fibrillation (HCC) 07/27/2012    Past Surgical History:  Procedure Laterality Date  . CARDIOVERSION  08  . HERNIA REPAIR  09/10/2012   large umbilical hernia  . INSERTION OF MESH  09/10/2012   Procedure: INSERTION OF MESH;  Surgeon: [name], MD;  Location: [facility] OR;  Service: General;  Laterality: N/A;  . TONSILLECTOMY     "maybe" (09/10/2012)  . UMBILICAL HERNIA REPAIR  09/10/2012   Procedure: HERNIA REPAIR UMBILICAL ADULT;  Surgeon: [name], MD;  Location: [facility] OR;  Service: General;  Laterality: N/A;       No family history on file.  Social History   Tobacco Use  . Smoking status: Former Smoker    Packs/day: 1.50    Years: 10.00    Pack years: 15.00    Types: Cigarettes    Quit date: 07/27/1982    Years since quitting: 37.2  . Smokeless tobacco: Never Used  Substance Use Topics  . Alcohol use:  No  . Drug use: No    Home Medications Prior to Admission medications   Medication Sig Start Date End Date Taking? Authorizing Provider  clindamycin (CLEOCIN) 300 MG capsule Take 1 capsule (300 mg total) by mouth 3 (three) times daily for 10 days. 11/02/19 11/12/19  Morayma Godown, DO  fish oil-omega-3 fatty acids 1000 MG capsule Take 1 g by mouth daily.    [provider]  Multiple Vitamins-Minerals (MULTIVITAMIN WITH MINERALS) tablet Take 1 tablet by mouth daily.    [provider]  mupirocin ointment (BACTROBAN) 2 % Apply 1 application topically 2 (two) times daily. 05/28/19   Cathie Hoops, Amy V, PA-C  warfarin (COUMADIN) 5 MG tablet Take 5 mg by mouth daily.  07/07/12   [provider]    Allergies    Patient has no known allergies.  Review of Systems   Review of Systems  Constitutional: Negative for chills and fever.  HENT: Negative for congestion, ear pain and sore throat.   Eyes: Negative for pain and visual disturbance.  Respiratory: Negative for cough and shortness of breath.   Cardiovascular: Positive for leg swelling (Left greater than right). Negative for chest pain and palpitations.  Gastrointestinal: Negative for abdominal pain and vomiting.  Genitourinary: Negative for dysuria and hematuria.  Musculoskeletal: Negative for arthralgias  and back pain.  Skin: Positive for color change and wound. Negative for rash.  Neurological: Negative for seizures and syncope.  All other systems reviewed and are negative.   Physical Exam Updated Vital Signs  ED Triage Vitals  Enc Vitals Group     BP 11/02/19 1656 (!) 151/91     Pulse Rate 11/02/19 1656 89     Resp 11/02/19 1656 18     Temp 11/02/19 1656 98.7 F (37.1 C)     Temp Source 11/02/19 1656 Oral     SpO2 11/02/19 1656 96 %     Weight 11/02/19 1653 240 lb (108.9 kg)     Height 11/02/19 1653 6' (1.829 m)     Head Circumference --      Peak Flow --      Pain Score 11/02/19 1653 3     Pain Loc --       Pain Edu? --      Excl. in [place]? --     Physical Exam Vitals and nursing note reviewed.  Constitutional:      Appearance: He is well-developed.  HENT:     Head: Normocephalic and atraumatic.     Nose: Nose normal.     Mouth/Throat:     Mouth: Mucous membranes are moist.  Eyes:     Extraocular Movements: Extraocular movements intact.     Conjunctiva/sclera: Conjunctivae normal.     Pupils: Pupils are equal, round, and reactive to light.  Cardiovascular:     Rate and Rhythm: Normal rate and regular rhythm.     Pulses: Normal pulses.     Heart sounds: Normal heart sounds. No murmur.  Pulmonary:     Effort: Pulmonary effort is normal. No respiratory distress.     Breath sounds: Normal breath sounds.  Abdominal:     Palpations: Abdomen is soft.     Tenderness: There is no abdominal tenderness.  Musculoskeletal:        General: Normal range of motion.     Cervical back: Normal range of motion and neck supple.     Right lower leg: Edema present.     Left lower leg: Edema present.     Comments: Left greater than right leg swelling with what appears to be venous stasis changes  Skin:    General: Skin is warm and dry.     Capillary Refill: Capillary refill takes less than 2 seconds.     Comments: Skin breakdown over the left shin with no major warmth but there is some erythema around to superficial skin ulcers, left leg more swollen than the right leg but also has some venous stasis changes  Neurological:     General: No focal deficit present.     Mental Status: He is alert.     ED Results / Procedures / Treatments   Labs (all labs ordered are listed, but only abnormal results are displayed) Labs Reviewed  COMPREHENSIVE METABOLIC PANEL - Abnormal; Notable for the following components:      Result Value   Glucose, Bld 113 (*)    Creatinine, Ser 1.30 (*)    GFR calc non Af Amer 51 (*)    GFR calc Af Amer 59 (*)    All other components within normal limits  PROTIME-INR -  Abnormal; Notable for the following components:   Prothrombin Time 21.2 (*)    INR 1.8 (*)    All other components within normal limits  CBC WITH DIFFERENTIAL/PLATELET  BRAIN NATRIURETIC  PEPTIDE  TROPONIN I (HIGH SENSITIVITY)    EKG EKG Interpretation  Date/Time:  Saturday November 02 2019 17:14:15 EST Ventricular Rate:  77 PR Interval:    QRS Duration: 98 QT Interval:  383 QTC Calculation: 434 R Axis:   88 Text Interpretation: Atrial fibrillation Ventricular tachycardia, unsustained Borderline right axis deviation Confirmed by Virgina Norfolk [phone]) on 11/02/2019 5:26:18 PM   Radiology US Venous Img Lower  Left (DVT Study)  Result Date: 11/02/2019 CLINICAL DATA:  Lower extremity pain. EXAM: LEFT LOWER EXTREMITY VENOUS DOPPLER ULTRASOUND TECHNIQUE: Gray-scale sonography with graded compression, as well as color Doppler and duplex ultrasound were performed to evaluate the lower extremity deep venous systems from the level of the common femoral vein and including the common femoral, femoral, profunda femoral, popliteal and calf veins including the posterior tibial, peroneal and gastrocnemius veins when visible. The superficial great saphenous vein was also interrogated. Spectral Doppler was utilized to evaluate flow at rest and with distal augmentation maneuvers in the common femoral, femoral and popliteal veins. COMPARISON:  None. FINDINGS: Contralateral Common Femoral Vein: Respiratory phasicity is normal and symmetric with the symptomatic side. No evidence of thrombus. Normal compressibility. Common Femoral Vein: No evidence of thrombus. Normal compressibility, respiratory phasicity and response to augmentation. Saphenofemoral Junction: No evidence of thrombus. Normal compressibility and flow on color Doppler imaging. Profunda Femoral Vein: No evidence of thrombus. Normal compressibility and flow on color Doppler imaging. Femoral Vein: No evidence of thrombus. Normal compressibility,  respiratory phasicity and response to augmentation. Popliteal Vein: No evidence of thrombus. Normal compressibility, respiratory phasicity and response to augmentation. Calf Veins: The calf veins were suboptimally evaluated. Superficial Great Saphenous Vein: No evidence of thrombus. Normal compressibility. Venous Reflux:  Not evaluated on this exam. Other Findings:  Lower extremity edema is noted. IMPRESSION: 1. No DVT identified, however the calf veins were suboptimally evaluated. 2. Nonspecific lower extremity edema is noted. Electronically Signed   By: Katherine Mantle M.D.   On: 11/02/2019 18:22   DG Chest Portable 1 View  Result Date: 11/02/2019 CLINICAL DATA:  Edema EXAM: PORTABLE CHEST 1 VIEW COMPARISON:  08/31/2012 FINDINGS: The heart size is stable from prior study. There is a hazy ill-defined opacity involving the left lower lung zone. There is no pneumothorax. An azygos lobe is noted. There is no acute osseous abnormality. IMPRESSION: Left lower lung zone opacity favored to represent a small left-sided pleural effusion with adjacent atelectasis. Electronically Signed   By: Katherine Mantle M.D.   On: 11/02/2019 17:34    Procedures Procedures (including critical care time)  Medications Ordered in ED Medications - No data to display  ED Course  I have reviewed the triage vital signs and the nursing notes.  Pertinent labs & imaging results that were available during my care of the patient were reviewed by me and considered in my medical decision making (see chart for details).    MDM Rules/Calculators/A&P  Eiden Bagot. is an 85 year old male with history of atrial fibrillation on Coumadin who presents to the ED with leg swelling.  Left leg were swollen than the right.  This has occurred over the last several weeks.  Left leg now more swollen than the right over the last several days with overlying skin breakdown over the shin.  Patient is on Coumadin.  It appears that he has  venous stasis disease in both legs and maybe now some mild erythema around a couple superficial ulcers of the left shin.  Could be a  cellulitis at this time.  Has good pulses in his feet.  Denies any major shortness of breath.  Normal vitals.  No history of heart failure.  Will evaluate for volume overload with chest x-ray, BNP, troponin, CBC, CMP.  Will get DVT study for left lower extremity.  Anticipate possibly antibiotics and close follow-up with primary care doctor if work-up is unremarkable.  BNP unremarkable.  Chest x-ray with maybe trace pleural effusion but otherwise no major signs of infection or volume overload.  No significant leukocytosis, anemia or electrolyte abnormality.  INR is 1.8.  Patient to take Coumadin today and will have it rechecked on Monday with PCP.  DVT study showed no blood clot.  Overall suspect venous stasis with now superficial cellulitis of the left shin.  Will start antibiotics.  We will have him reevaluated by his primary care doctor on Monday as well.  Given return precautions.  No concern for systemic infection given no fever, no leukocytosis.  Given return precautions.  As patient was awaiting discharge, family did state that patient has been on some Keflex for possible infection to left lower leg but will switch this to clindamycin.  No signs of sepsis at this time.  Was given strict return precautions as he may need IV antibiotics if develops a fever but at this time can trial clindamycin and have close follow-up with primary care doctor.  This chart was dictated using voice recognition software.  Despite best efforts to proofread,  errors can occur which can change the documentation meaning.    Final Clinical Impression(s) / ED Diagnoses Final diagnoses:  Cellulitis, unspecified cellulitis site  Leg edema    Rx / DC Orders ED Discharge Orders         Ordered    clindamycin (CLEOCIN) 300 MG capsule  3 times daily     11/02/19 1826           Virgina Norfolk, DO 11/02/19 1826    Virgina Norfolk, DO 11/02/19 [phone]

## 2019-11-02 NOTE — ED Notes (Signed)
Dr. Lockie Mola speaking with pt's daughter Maralyn Sago at pt request

## 2019-11-02 NOTE — ED Triage Notes (Signed)
Pt arrives ambulatory to ED with reports of left leg swelling over several weeks, states that it has gotten worse over the past few days. LLE is red, swollen, and has multiple wounds.

## 2019-11-07 ENCOUNTER — Telehealth: Payer: Self-pay | Admitting: Cardiology

## 2019-11-07 NOTE — Telephone Encounter (Signed)
NOTED IN APPT NOTES PT NEEDS ASSISTANCE /.CY

## 2019-11-07 NOTE — Telephone Encounter (Signed)
New Message:        Daughter called and said she will need to come in with pt for his appt with Dr M[place] Lions. Pt have problem walking and can not hear.

## 2019-11-10 DIAGNOSIS — Z7189 Other specified counseling: Secondary | ICD-10-CM | POA: Insufficient documentation

## 2019-11-10 DIAGNOSIS — L03115 Cellulitis of right lower limb: Secondary | ICD-10-CM | POA: Insufficient documentation

## 2019-11-10 NOTE — Progress Notes (Signed)
Cardiology Office Note   Date:  11/12/2019   ID:  Warren Wagner., DOB 1935/02/18, MRN [id number]  PCP:  Aida Puffer, MD  Cardiologist:   No primary care provider on file. Referring:  Self  Chief Complaint  Patient presents with  . Leg Swelling      History of Present Illness: Warren Wagner. is a 85 y.o. male who presents for evaluation of lower extremity swelling  He has atrial fib and has been on warfarin. He is new to me. He was in the the ED last week.  I reviewed these records for this visit.   He had right leg swelling and had was treated for possible cellulitis.  Lower extremity left Doppler was negative for DVT.  Chest x-ray showed no effusion.  His EKG had A. fib with some wide-complex that was probably A. fib with aberrancy.  He has had a history of atrial fibrillation for years and I see that he had a cardioversion in 2008.  He was followed briefly by another cardiology group but has not been seen in a long time.  He has never had an MI or history of heart failure.  However, he has been getting increasing lower extremity swelling probably 4 weeks.  This is probably been even longer.  He is relatively sedentary.  He walks with a cane for balance.  He walks to the grocery store holding onto a cart.  He says he will occasionally do the treadmill but it does not sound like he has done this recently.  He will get short of breath walking about 50 yards up an incline from his mailbox.  He denies any chest pressure, neck or arm discomfort.  He does not have any resting shortness of breath, PND or orthopnea.  He has had no cough fevers or chills.   Past Medical History:  Diagnosis Date  . Atrial fibrillation (HCC)   . Hearing loss     Past Surgical History:  Procedure Laterality Date  . CARDIOVERSION  08  . HERNIA REPAIR  09/10/2012   large umbilical hernia  . INSERTION OF MESH  09/10/2012   Procedure: INSERTION OF MESH;  Surgeon: [name], MD;  Location:  [facility] OR;  Service: General;  Laterality: N/A;  . TONSILLECTOMY     "maybe" (09/10/2012)  . UMBILICAL HERNIA REPAIR  09/10/2012   Procedure: HERNIA REPAIR UMBILICAL ADULT;  Surgeon: [name], MD;  Location: [facility] OR;  Service: General;  Laterality: N/A;     Current Outpatient Medications  Medication Sig Dispense Refill  . clindamycin (CLEOCIN) 300 MG capsule Take 1 capsule (300 mg total) by mouth 3 (three) times daily for 10 days. 30 capsule 0  . fish oil-omega-3 fatty acids 1000 MG capsule Take 1 g by mouth daily.    . Multiple Vitamins-Minerals (MULTIVITAMIN WITH MINERALS) tablet Take 1 tablet by mouth daily.    . mupirocin ointment (BACTROBAN) 2 % Apply 1 application topically 2 (two) times daily. 22 g 0  . warfarin (COUMADIN) 5 MG tablet Take 5 mg by mouth daily.     . furosemide (LASIX) 40 MG tablet Take 1 tablet (40 mg total) by mouth daily. 90 tablet 3   No current facility-administered medications for this visit.    Allergies:   Patient has no known allergies.    Social History:  The patient  reports that he quit smoking about 37 years ago. His smoking use included cigarettes. He has  a 15.00 pack-year smoking history. He has never used smokeless tobacco. He reports that he does not drink alcohol or use drugs.   Family History:  The patient's family history is not on file.   He cannot give a family history.    ROS:  Please see the history of present illness.   Otherwise, review of systems are positive for none.   All other systems are reviewed and negative.    PHYSICAL EXAM: VS:  BP 132/84   Pulse 73   Ht 6' (1.829 m)   Wt 250 lb 12.8 oz (113.8 kg)   BMI 34.01 kg/m  , BMI Body mass index is 34.01 kg/m. GENERAL:  Well appearing HEENT:  Pupils equal round and reactive, fundi not visualized, oral mucosa unremarkable NECK:  No jugular venous distention, waveform within normal limits, carotid upstroke brisk and symmetric, no bruits, no thyromegaly LYMPHATICS:  No cervical,  inguinal adenopathy LUNGS:  Clear to auscultation bilaterally BACK:  No CVA tenderness CHEST:  Unremarkable HEART:  PMI not displaced or sustained,S1 and S2 within normal limits, no S3,  no clicks, no rubs, no murmurs, irregular ABD:  Flat, positive bowel sounds normal in frequency in pitch, no bruits, no rebound, no guarding, no midline pulsatile mass, no hepatomegaly, no splenomegaly EXT:  2 plus pulses throughout, moderate left greater than right lower extremity edema, no cyanosis no clubbing, venous stasis ulcer on the left pretibial SKIN:  No rashes no nodules NEURO:  Cranial nerves II through XII grossly intact, motor grossly intact throughout PSYCH:  Cognitively intact, oriented to person place and time    EKG:  EKG is ordered today. The ekg ordered today demonstrates atrial fibrillation, rate 73, axis within normal limits, intervals within normal limits, premature ventricular contraction.   Recent Labs: 11/02/2019: ALT 31; B Natriuretic Peptide 84.3; BUN 23; Creatinine, Ser 1.30; Hemoglobin 15.9; Platelets 181; Potassium 5.1; Sodium 138    Lipid Panel No results found for: CHOL, TRIG, HDL, CHOLHDL, VLDL, LDLCALC, LDLDIRECT    Wt Readings from Last 3 Encounters:  11/12/19 250 lb 12.8 oz (113.8 kg)  11/02/19 240 lb (108.9 kg)  12/04/12 234 lb 6.4 oz (106.3 kg)      Other studies Reviewed: Additional studies/ records that were reviewed today include: ED records. Review of the above records demonstrates:  Please see elsewhere in the note.     ASSESSMENT AND PLAN:  CHRONIC ATRIAL FIB:    The patient has chronic atrial fibrillation with what looks like reasonable rate control.  He tolerates anticoagulation.   Mr. Warren Wagner. has a CHA2DS2 - VASc score of 2.  No change in therapy.  His INR is followed by his primary provider..    CELLULITIS: He had this treated and I am not convinced that there is any active ongoing infection but he needs management of his lower  extremity edema.  LEG SWELLING: I am going to check an echocardiogram given the leg swelling and his pleural effusion.  I am going to start Lasix 40 mg daily.  Of note he had some mild renal insufficiency so he is to get blood work at his primary office on Monday with results.  He can come back to see [place] when he gets the echo.  We talked about salt and fluid restriction.  We talked about keeping his feet elevated.  His daughter is a nurse so she was able to help with these instructions.  Current medicines are reviewed at length with the  patient today.  The patient does not have concerns regarding medicines.  The following changes have been made:  no change  Labs/ tests ordered today include:   Orders Placed This Encounter  Procedures  . Basic metabolic panel  . EKG 12-Lead  . ECHOCARDIOGRAM COMPLETE     Disposition:   FU with APP in about 10 days when he gets his echo.   Signed, Minus Breeding, MD  11/12/2019 3:20 PM    [facility]

## 2019-11-12 ENCOUNTER — Other Ambulatory Visit: Payer: Self-pay

## 2019-11-12 ENCOUNTER — Ambulatory Visit: Payer: Medicare Other | Admitting: Cardiology

## 2019-11-12 ENCOUNTER — Encounter: Payer: Self-pay | Admitting: Cardiology

## 2019-11-12 VITALS — BP 132/84 | HR 73 | Ht 72.0 in | Wt 250.8 lb

## 2019-11-12 DIAGNOSIS — L03115 Cellulitis of right lower limb: Secondary | ICD-10-CM

## 2019-11-12 DIAGNOSIS — I482 Chronic atrial fibrillation, unspecified: Secondary | ICD-10-CM | POA: Diagnosis not present

## 2019-11-12 DIAGNOSIS — Z7189 Other specified counseling: Secondary | ICD-10-CM | POA: Diagnosis not present

## 2019-11-12 MED ORDER — FUROSEMIDE 40 MG PO TABS: 40.0000 mg | ORAL_TABLET | Freq: Every day | ORAL | 3 refills | Status: DC

## 2019-11-12 NOTE — Patient Instructions (Addendum)
Medication Instructions:  Lasix 40mg  daily *If you need a refill on your cardiac medications before your next appointment, please call your pharmacy*  Lab Work: Your physician recommends that you return for lab work on Monday with your primary care provider (BMP)  Testing/Procedures: Your physician has requested that you have an echocardiogram. Echocardiography is a painless test that uses sound waves to create images of your heart. It provides your doctor with information about the size and shape of your heart and how well your heart's chambers and valves are working. This procedure takes approximately one hour. There are no restrictions for this procedure. [address]  Follow-Up: At [facility], you and your health needs are our priority.  As part of our continuing mission to provide you with exceptional heart care, we have created designated Provider Care Teams.  These Care Teams include your primary Cardiologist (physician) and Advanced Practice Providers (APPs -  Physician Assistants and Nurse Practitioners) who all work together to provide you with the care you need, when you need it.  Your next appointment:   10 days with an APP, preferably on day of Echo

## 2019-11-13 DIAGNOSIS — I83012 Varicose veins of right lower extremity with ulcer of calf: Secondary | ICD-10-CM | POA: Insufficient documentation

## 2019-11-13 DIAGNOSIS — I878 Other specified disorders of veins: Secondary | ICD-10-CM | POA: Insufficient documentation

## 2019-11-13 DIAGNOSIS — R600 Localized edema: Secondary | ICD-10-CM | POA: Insufficient documentation

## 2019-11-13 DIAGNOSIS — L97212 Non-pressure chronic ulcer of right calf with fat layer exposed: Secondary | ICD-10-CM | POA: Insufficient documentation

## 2019-11-21 ENCOUNTER — Ambulatory Visit (HOSPITAL_COMMUNITY): Payer: Medicare Other | Attending: Cardiology

## 2019-11-21 ENCOUNTER — Other Ambulatory Visit: Payer: Self-pay

## 2019-11-21 DIAGNOSIS — I482 Chronic atrial fibrillation, unspecified: Secondary | ICD-10-CM | POA: Insufficient documentation

## 2019-11-22 ENCOUNTER — Ambulatory Visit: Payer: Medicare Other | Admitting: Family Medicine

## 2019-11-22 ENCOUNTER — Encounter: Payer: Self-pay | Admitting: Cardiology

## 2019-11-22 VITALS — BP 135/84 | HR 87 | Temp 97.2°F | Ht 72.0 in | Wt 245.0 lb

## 2019-11-22 DIAGNOSIS — I77819 Aortic ectasia, unspecified site: Secondary | ICD-10-CM

## 2019-11-22 DIAGNOSIS — I482 Chronic atrial fibrillation, unspecified: Secondary | ICD-10-CM

## 2019-11-22 DIAGNOSIS — R600 Localized edema: Secondary | ICD-10-CM | POA: Diagnosis not present

## 2019-11-22 DIAGNOSIS — I890 Lymphedema, not elsewhere classified: Secondary | ICD-10-CM | POA: Insufficient documentation

## 2019-11-22 MED ORDER — METOPROLOL TARTRATE 50 MG PO TABS: ORAL_TABLET | ORAL | 0 refills | Status: DC

## 2019-11-22 NOTE — Patient Instructions (Addendum)
Medication Instructions:  Your physician recommends that you continue on your current medications as directed. Please refer to the Current Medication list given to you today. DO NOT PICK UP METOPROLOL UNTIL YOUR CORONARY CT HAS BEEN SCHEDULED *If you need a refill on your cardiac medications before your next appointment, please call your pharmacy*  Lab Work: Your physician recommends that you return for lab work in: 7 days before Coronary CT If you have labs (blood work) drawn today and your tests are completely normal, you will receive your results only by: Marland Kitchen MyChart Message (if you have MyChart) OR . A paper copy in the mail If you have any lab test that is abnormal or we need to change your treatment, we will call you to review the results.  Testing/Procedures: See below  Follow-Up: At [facility], you and your health needs are our priority.  As part of our continuing mission to provide you with exceptional heart care, we have created designated Provider Care Teams.  These Care Teams include your primary Cardiologist (physician) and Advanced Practice Providers (APPs -  Physician Assistants and Nurse Practitioners) who all work together to provide you with the care you need, when you need it.  Your next appointment:   6 month(s)  The format for your next appointment:   In Person  Provider:   [name], MD  Other Instructions    INSTRUCTIONS FOR CORONARY CTA  Please arrive at the [facility] main entrance of [facility] at (30-45 minutes prior to test start time)  [facility] [address] [place], [place] [address] [phone]  Proceed to the [facility] Radiology Department (First Floor).  Please follow these instructions carefully (unless otherwise directed): PLEASE HAVE LABS - BMP  AT LEAST ONE WEEK PRIOR TO TEST  On the Night Before the Test: Drink plenty of water. Do not consume any caffeinated/decaffeinated beverages or chocolate  12 hours prior to your test. Do not take any antihistamines 12 hours prior to your test.  On the Day of the Test: Drink plenty of water. Do not drink any water within one hour of the test. Do not eat any food 4 hours prior to the test. You may take your regular medications prior to the test. Take 50 mg of Lopressor (Metoprolol) one hour before the test.  After the Test: Drink plenty of water. After receiving IV contrast, you may experience a mild flushed feeling. This is normal. On occasion, you may experience a mild rash up to 24 hours after the test. This is not dangerous. If this occurs, you can take Benadryl 25 mg and increase your fluid intake. If you experience trouble breathing, this can be serious. If it is severe call 911 IMMEDIATELY. If it is mild, please call our office.

## 2019-11-22 NOTE — Progress Notes (Signed)
Cardiology Office Note  Date: 11/22/2019   ID: Warren Wagner., DOB 04/13/1935, MRN [id number]  PCP:  Warren Puffer, MD  Cardiologist:  No primary care provider on file. Electrophysiologist:  None   Chief Complaint  Patient presents with  . Follow-up    Lower extremity edema, dilated ascending aorta, atrial fibrillation.    History of Present Illness: Warren Wagner. is a 85 y.o. male recently saw Dr. Jens Wagner November 12, 2019 for evaluation of lower extremity swelling.  He has a history of chronic atrial fibrillation on Coumadin.  He had been seen the previous week in the emergency room for right leg swelling was treated for cellulitis.  He had a lower extremity venous duplex study and was negative for DVT.  Chest x-ray showed no effusion.  EKG in the emergency room showed atrial fibrillation with some wide-complex was likely A. fib with aberrancy.  Chronic atrial fibrillation.  Previous cardioversion 2018.  He had shortness of breath on walking 50 yards.  Recently seen by vascular surgery [facility] and diagnosed with venous stasis ulcer of left calf with fat layer exposed, unspecified whether varicose veins present. Venous stasis of lower extremity, Bilateral leg edema .  Plans are to have bilateral lower extremity venous reflux studies to rule out any venous stasis issues.  He was started on Unna boot with Aquacel dressing over his ulcer of the left lower extremity.  He was to return in 1 week for [employer] change as well as the venous reflux studies.    Recent echocardiogram on 11/21/2019 showed normal LV function, moderate LVH, moderately dilated ascending aorta of 46 mm, gradient 18, AVA 1 cm; mild MR; severe LAE, EF 55 to 60%.  Mild elevation of pulmonary artery systolic pressure.  Mild MR, moderate aortic stenosis.  CTA or MRI and was suggested to assess the dilated ascending aorta.  Recent lower venous studies done today demonstrate bilateral deep  system reflux in the femoral veins, right also has popliteal vein reflux, his bilateral greater saphenous veins are both competent in their entirety.  ABIs showed normal ankle waveforms of his bilateral lower extremities.  Ankle pressures were invalid due to calcified vessels but do have triphasic waveforms.  Patient is wearing Unna boots today and will be following up with wound care secondary to venous stasis ulcers.  After wounds are healed patient is to start compression therapy with knee-high compression stockings.  Patient was being referred to lymphedema clinic to hopefully help him obtain possible lymphedema pumps.  Patient denies any anginal or exertional symptoms today.  Denies palpitations or arrhythmias but is in chronic atrial fibrillation.  No orthostatic symptoms i.e. syncopal or near syncopal episodes, CVA or TIA-like symptoms, dyspeptic symptoms, or blood in stool or urine.  Denies any claudication-like symptoms.  He is walking with a cane today.   Past Medical History:  Diagnosis Date  . Atrial fibrillation (HCC)   . Hearing loss     Past Surgical History:  Procedure Laterality Date  . CARDIOVERSION  08  . HERNIA REPAIR  09/10/2012   large umbilical hernia  . INSERTION OF MESH  09/10/2012   Procedure: INSERTION OF MESH;  Surgeon: [name], MD;  Location: [facility] OR;  Service: General;  Laterality: N/A;  . TONSILLECTOMY     "maybe" (09/10/2012)  . UMBILICAL HERNIA REPAIR  09/10/2012   Procedure: HERNIA REPAIR UMBILICAL ADULT;  Surgeon: [name], MD;  Location: [facility] OR;  Service: General;  Laterality: N/A;    Current Outpatient Medications  Medication Sig Dispense Refill  . fish oil-omega-3 fatty acids 1000 MG capsule Take 1 g by mouth daily.    . furosemide (LASIX) 40 MG tablet Take 1 tablet (40 mg total) by mouth daily. 90 tablet 3  . Multiple Vitamins-Minerals (MULTIVITAMIN WITH MINERALS) tablet Take 1 tablet by mouth daily.    . mupirocin ointment (BACTROBAN) 2  % Apply 1 application topically 2 (two) times daily. 22 g 0  . warfarin (COUMADIN) 5 MG tablet Take 5 mg by mouth daily.     . metoprolol tartrate (LOPRESSOR) 50 MG tablet Take 1 hour prior to test 1 tablet 0   No current facility-administered medications for this visit.   Allergies:  Patient has no known allergies.   Social History: The patient  reports that he quit smoking about 37 years ago. His smoking use included cigarettes. He has a 15.00 pack-year smoking history. He has never used smokeless tobacco. He reports that he does not drink alcohol or use drugs.   Family History: The patient's family history is not on file.   ROS:  Please see the history of present illness. Otherwise, complete review of systems is positive for none.  All other systems are reviewed and negative.   Physical Exam: VS:  BP 135/84   Pulse 87   Temp (!) 97.2 F (36.2 C)   Ht 6' (1.829 m)   Wt 245 lb (111.1 kg)   SpO2 98%   BMI 33.23 kg/m , BMI Body mass index is 33.23 kg/m.  Wt Readings from Last 3 Encounters:  11/22/19 245 lb (111.1 kg)  11/12/19 250 lb 12.8 oz (113.8 kg)  11/02/19 240 lb (108.9 kg)    General: Patient appears comfortable at rest. HEENT: Conjunctiva and lids normal, oropharynx clear with moist mucosa. Neck: Supple, no elevated JVP or carotid bruits, no thyromegaly. Lungs: Clear to auscultation, nonlabored breathing at rest. Cardiac: Regular rate and rhythm, no S3 or significant systolic murmur, no pericardial rub. Abdomen: Soft, nontender, no hepatomegaly, bowel sounds present, no guarding or rebound. Extremities: No pitting edema, distal pulses 2+. Skin: Warm and dry. Musculoskeletal: No kyphosis. Neuropsychiatric: Alert and oriented x3, affect grossly appropriate.  ECG:  NONE  Recent Labwork: 11/02/2019: ALT 31; AST 35; B Natriuretic Peptide 84.3; BUN 23; Creatinine, Ser 1.30; Hemoglobin 15.9; Platelets 181; Potassium 5.1; Sodium 138  No results found for: CHOL, TRIG, HDL,  CHOLHDL, VLDL, LDLCALC, LDLDIRECT  Other Studies Reviewed Today:  Echocardiogram November 21, 2019 1. Normal LV systolic function; moderate LVH; moderately dilated ascending aorta (46 mm; suggest CTA or MRA to further assess); calcified aortic valve with moderate AS (mean gradient 18 mmHg; AVA 1 cm2); mild MR; severe LAE. 2. Left ventricular ejection fraction, by estimation, is 55 to 60%. The left ventricle has normal function. The left ventrical has no regional wall motion abnormalities. There is moderately increased left ventricular hypertrophy. Left ventricular diastolic function could not be evaluated. 3. Right ventricular systolic function is normal. The right ventricular size is normal. There is mildly elevated pulmonary artery systolic pressure. 4. Left atrial size was severely dilated. 5. The mitral valve is normal in structure and function. Mild mitral valve regurgitation. No evidence of mitral stenosis. 6. The aortic valve is normal in structure and function. Aortic valve regurgitation is not visualized. Moderate aortic valve stenosis. 7. Aortic dilatation noted. There is moderate dilatation of the aortic root measuring 46 mm. 8. The inferior  vena cava is normal in size with greater than 50% respiratory variability, suggesting right atrial pressure of 3 mmHg.  Low venous Doppler study 11/02/2019 IMPRESSION: 1. No DVT identified, however the calf veins were suboptimally evaluated. 2. Nonspecific lower extremity edema is noted.   Vascular study results:  BLE ABIs show normal ankle waveforms of his bilateral lower extremities Ankle pressures are invalid due to calcified vessels but do have triphasic waveforms  Bilateral lower extremity venous reflux studies reveal bilateral deep system reflux in the femoral veins, right also has popliteal vein reflux, his bilateral greater saphenous veins are both competent in their entirety  These findings were discussed in detail with the  patient and his daughter in clinic today.    Assessment and Plan:  1. Chronic atrial fibrillation (HCC)   2. Bilateral lower extremity edema   3. Aortic dilatation (HCC)    1. Chronic atrial fibrillation [facility]) Patient denies any palpitations or arrhythmias.  Heart rate today 87 and irregularly irregular.  Continue Coumadin 5 mg daily.  2. Bilateral lower extremity edema Patient recently had lower venous Doppler studies for venous insufficiency revealing bilateral deep system reflux in the femoral veins, right also has popliteal vein reflux.  His bilateral great saphenous veins are both competent in their entirety.  ABIs showed normal waveforms of bilateral lower extremities.  Ankle pressures were invalid due to calcified vessels but did have triphasic waveforms.  Patient will be starting with wound clinic for treatment of bilateral lower extremity venous stasis ulcers.  He is to start wearing compression stockings after wound care.  He is being referred to the lymphedema clinic for possible lymphedema therapy/pump.  Continue Lasix 40 mg daily for lower extremity edema.  3. Aortic dilation Echocardiogram on 11/21/2019 demonstrated moderate dilatation of the aortic root measuring 46 mm.  Recommendations were to obtain CTA or MRA for evaluation.  Get CTA chest to evaluate aortic dilation.  Medication Adjustments/Labs and Tests Ordered: Current medicines are reviewed at length with the patient today.  Concerns regarding medicines are outlined above.    Patient Instructions  Medication Instructions:  Your physician recommends that you continue on your current medications as directed. Please refer to the Current Medication list given to you today. DO NOT PICK UP METOPROLOL UNTIL YOUR CORONARY CT HAS BEEN SCHEDULED *If you need a refill on your cardiac medications before your next appointment, please call your pharmacy*  Lab Work: Your physician recommends that you return for lab work in: 7 days  before Coronary CT If you have labs (blood work) drawn today and your tests are completely normal, you will receive your results only by: Marland Kitchen MyChart Message (if you have MyChart) OR . A paper copy in the mail If you have any lab test that is abnormal or we need to change your treatment, we will call you to review the results.  Testing/Procedures: See below  Follow-Up: At [facility], you and your health needs are our priority.  As part of our continuing mission to provide you with exceptional heart care, we have created designated Provider Care Teams.  These Care Teams include your primary Cardiologist (physician) and Advanced Practice Providers (APPs -  Physician Assistants and Nurse Practitioners) who all work together to provide you with the care you need, when you need it.  Your next appointment:   6 month(s)  The format for your next appointment:   In Person  Provider:   [name], MD  Other Instructions    INSTRUCTIONS  FOR CORONARY CTA  Please arrive at the [facility] main entrance of [facility] at (30-45 minutes prior to test start time)  [facility] [address] [place], [place] [address] [phone]  Proceed to the [facility] Radiology Department (First Floor).  Please follow these instructions carefully (unless otherwise directed): PLEASE HAVE LABS - BMP  AT LEAST ONE WEEK PRIOR TO TEST  On the Night Before the Test: Drink plenty of water. Do not consume any caffeinated/decaffeinated beverages or chocolate 12 hours prior to your test. Do not take any antihistamines 12 hours prior to your test.  On the Day of the Test: Drink plenty of water. Do not drink any water within one hour of the test. Do not eat any food 4 hours prior to the test. You may take your regular medications prior to the test. Take 50 mg of Lopressor (Metoprolol) one hour before the test.  After the Test: Drink plenty of water. After receiving IV  contrast, you may experience a mild flushed feeling. This is normal. On occasion, you may experience a mild rash up to 24 hours after the test. This is not dangerous. If this occurs, you can take Benadryl 25 mg and increase your fluid intake. If you experience trouble breathing, this can be serious. If it is severe call 911 IMMEDIATELY. If it is mild, please call our office.         Signed, Rennis Harding, NP 11/22/2019 4:31 PM    [facility] at [facility] [address] [place], [place], [place] [address] Phone: [phone]; Fax: [phone]

## 2019-12-04 ENCOUNTER — Ambulatory Visit: Payer: Medicare Other | Admitting: Adult Health

## 2019-12-05 ENCOUNTER — Telehealth: Payer: Self-pay | Admitting: *Deleted

## 2019-12-05 LAB — BASIC METABOLIC PANEL
BUN/Creatinine Ratio: 16 (ref 10–24)
BUN: 18 mg/dL (ref 8–27)
CO2: 23 mmol/L (ref 20–29)
Calcium: 10.1 mg/dL (ref 8.6–10.2)
Chloride: 101 mmol/L (ref 96–106)
Creatinine, Ser: 1.14 mg/dL (ref 0.76–1.27)
GFR calc Af Amer: 69 mL/min/{1.73_m2} (ref >59)
GFR calc non Af Amer: 60 mL/min/{1.73_m2} (ref >59)
Glucose: 89 mg/dL (ref 65–99)
Potassium: 4.4 mmol/L (ref 3.5–5.2)
Sodium: 142 mmol/L (ref 134–144)

## 2019-12-05 NOTE — Telephone Encounter (Signed)
Patient informed. Copy sent to PCP °

## 2019-12-05 NOTE — Telephone Encounter (Signed)
-----   Message from Netta Neat., NP sent at 12/05/2019  1:45 PM EST ----- Please call patient and tell him all of his lab work was normal.

## 2019-12-13 ENCOUNTER — Ambulatory Visit (INDEPENDENT_AMBULATORY_CARE_PROVIDER_SITE_OTHER)
Admission: RE | Admit: 2019-12-13 | Discharge: 2019-12-13 | Disposition: A | Discharge status: Home / Self Care | Payer: Medicare Other | Source: Ambulatory Visit | Attending: Family Medicine | Admitting: Family Medicine

## 2019-12-13 ENCOUNTER — Other Ambulatory Visit: Payer: Self-pay

## 2019-12-13 DIAGNOSIS — I77819 Aortic ectasia, unspecified site: Secondary | ICD-10-CM | POA: Diagnosis not present

## 2019-12-13 DIAGNOSIS — I482 Chronic atrial fibrillation, unspecified: Secondary | ICD-10-CM

## 2019-12-13 DIAGNOSIS — R600 Localized edema: Secondary | ICD-10-CM | POA: Diagnosis not present

## 2019-12-13 MED ORDER — IOHEXOL 350 MG/ML SOLN
100.0000 mL | Freq: Once | INTRAVENOUS | Status: AC | PRN
  Administered 2019-12-13: 100 mL via INTRAVENOUS

## 2019-12-17 ENCOUNTER — Telehealth: Payer: Self-pay | Admitting: *Deleted

## 2019-12-17 NOTE — Telephone Encounter (Signed)
mychart message sent

## 2019-12-17 NOTE — Telephone Encounter (Signed)
-----   Message from Netta Neat., NP sent at 12/15/2019  9:35 PM EST ----- Please call patient and tell him he has a mild uncomplicated dilation of the aorta. Tell him the guidelines suggest a yearly exam  to make sure the dilation does not change.

## 2020-02-25 ENCOUNTER — Telehealth: Payer: Self-pay | Admitting: Cardiology

## 2020-02-25 NOTE — Telephone Encounter (Signed)
Contact patient 02/25/20 to schedule appointment, left voicemail °

## 2020-03-02 ENCOUNTER — Ambulatory Visit (INDEPENDENT_AMBULATORY_CARE_PROVIDER_SITE_OTHER): Payer: Medicare Other | Admitting: Ophthalmology

## 2020-03-02 ENCOUNTER — Encounter (INDEPENDENT_AMBULATORY_CARE_PROVIDER_SITE_OTHER): Payer: Self-pay | Admitting: Ophthalmology

## 2020-03-02 ENCOUNTER — Other Ambulatory Visit: Payer: Self-pay

## 2020-03-02 DIAGNOSIS — H353133 Nonexudative age-related macular degeneration, bilateral, advanced atrophic without subfoveal involvement: Secondary | ICD-10-CM | POA: Diagnosis not present

## 2020-03-02 DIAGNOSIS — H43813 Vitreous degeneration, bilateral: Secondary | ICD-10-CM | POA: Diagnosis not present

## 2020-03-02 DIAGNOSIS — H353113 Nonexudative age-related macular degeneration, right eye, advanced atrophic without subfoveal involvement: Secondary | ICD-10-CM | POA: Insufficient documentation

## 2020-03-02 DIAGNOSIS — H353222 Exudative age-related macular degeneration, left eye, with inactive choroidal neovascularization: Secondary | ICD-10-CM | POA: Insufficient documentation

## 2020-03-02 DIAGNOSIS — H353221 Exudative age-related macular degeneration, left eye, with active choroidal neovascularization: Secondary | ICD-10-CM

## 2020-03-02 MED ORDER — BEVACIZUMAB CHEMO INJECTION 1.25MG/0.05ML SYRINGE FOR KALEIDOSCOPE
1.2500 mg | INTRAVITREAL | Status: AC | PRN
  Administered 2020-03-02: 1.25 mg via INTRAVITREAL

## 2020-03-02 NOTE — Progress Notes (Signed)
03/02/2020     CHIEF COMPLAINT Patient presents for Retina Follow Up   HISTORY OF PRESENT ILLNESS: Warren Kley. is a 85 y.o. 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  Duration of 3 months.  Since onset it is stable.          Comments    3 month follow up - OCT OU, Possible Avastin OS Patient denies change in vision and overall has no complaints.         Last edited by Berenice Bouton on 03/02/2020  9:32 AM. (History)      Referring physician: Aida Puffer, MD [address] [place],  [place] [address]  HISTORICAL INFORMATION:   Selected notes from the MEDICAL RECORD NUMBER       CURRENT MEDICATIONS: No current outpatient medications on file. (Ophthalmic Drugs)   No current facility-administered medications for this visit. (Ophthalmic Drugs)   Current Outpatient Medications (Other)  Medication Sig  . fish oil-omega-3 fatty acids 1000 MG capsule Take 1 g by mouth daily.  . furosemide (LASIX) 40 MG tablet Take 1 tablet (40 mg total) by mouth daily.  . metoprolol tartrate (LOPRESSOR) 50 MG tablet Take 1 hour prior to test  . Multiple Vitamins-Minerals (MULTIVITAMIN WITH MINERALS) tablet Take 1 tablet by mouth daily.  . mupirocin ointment (BACTROBAN) 2 % Apply 1 application topically 2 (two) times daily.  Marland Kitchen warfarin (COUMADIN) 5 MG tablet Take 5 mg by mouth daily.    No current facility-administered medications for this visit. (Other)      REVIEW OF SYSTEMS:    ALLERGIES No Known Allergies  PAST MEDICAL HISTORY Past Medical History:  Diagnosis Date  . Atrial fibrillation (HCC)   . Hearing loss    Past Surgical History:  Procedure Laterality Date  . CARDIOVERSION  08  . HERNIA REPAIR  09/10/2012   large umbilical hernia  . INSERTION OF MESH  09/10/2012   Procedure: INSERTION OF MESH;  Surgeon: [name], MD;  Location: [facility] OR;  Service: General;  Laterality: N/A;    . TONSILLECTOMY     "maybe" (09/10/2012)  . UMBILICAL HERNIA REPAIR  09/10/2012   Procedure: HERNIA REPAIR UMBILICAL ADULT;  Surgeon: [name], MD;  Location: [facility] OR;  Service: General;  Laterality: N/A;    FAMILY HISTORY History reviewed. No pertinent family history.  SOCIAL HISTORY Social History   Tobacco Use  . Smoking status: Former Smoker    Packs/day: 1.50    Years: 10.00    Pack years: 15.00    Types: Cigarettes    Quit date: 07/27/1982    Years since quitting: 37.6  . Smokeless tobacco: Never Used  Substance Use Topics  . Alcohol use: No  . Drug use: No         OPHTHALMIC EXAM: Base Eye Exam    Visual Acuity (Snellen - Linear)      Right Left   Dist cc 20/50-1 CF @ 5'     Dist ph cc 20/40-2 NI   Correction: Glasses       Tonometry (Tonopen, 9:37 AM)      Right Left   Pressure 10 12       Pupils      Pupils Dark Light Shape React APD   Right PERRL 4 3 Round Slow None   Left PERRL  4 3 Round Slow None       Visual Fields (Counting fingers)      Left Right    Full Full       Extraocular Movement      Right Left    Full Full       Neuro/Psych    Oriented x3: Yes   Mood/Affect: Normal       Dilation    Both eyes: 1.0% Mydriacyl, 2.5% Phenylephrine @ 9:37 AM        Slit Lamp and Fundus Exam    External Exam      Right Left   External Normal Normal       Slit Lamp Exam      Right Left   Lids/Lashes Normal Normal   Conjunctiva/Sclera White and quiet White and quiet   Cornea Clear Clear   Anterior Chamber Deep and quiet Deep and quiet   Iris Round and reactive Round and reactive   Lens Posterior chamber intraocular lens Posterior chamber intraocular lens   Vitreous Normal Normal          IMAGING AND PROCEDURES  Imaging and Procedures for 03/02/20  OCT, Retina - OU - Both Eyes       Right Eye Quality was good. Scan locations included subfoveal. Central Foveal Thickness: 285. Progression has been stable. Findings  include abnormal foveal contour, retinal drusen , pigment epithelial detachment.   Left Eye Quality was good. Scan locations included subfoveal. Central Foveal Thickness: 206. Progression has been stable. Findings include abnormal foveal contour, pigment epithelial detachment, retinal drusen .                 ASSESSMENT/PLAN:  No problem-specific Assessment & Plan notes found for this encounter.    No diagnosis found.  1.  2.  3.  Ophthalmic Meds Ordered this visit:  No orders of the defined types were placed in this encounter.      No follow-ups on file.  There are no Patient Instructions on file for this visit.   Explained the diagnoses, plan, and follow up with the patient and they expressed understanding.  Patient expressed understanding of the importance of proper follow up care.   Clent Demark Jaye Saal M.D. Diseases & Surgery of the Retina and Vitreous Retina & Diabetic [place] 03/02/20     Abbreviations: M myopia (nearsighted); A astigmatism; H hyperopia (farsighted); P presbyopia; Mrx spectacle prescription;  CTL contact lenses; OD right eye; OS left eye; OU both eyes  XT exotropia; ET esotropia; PEK punctate epithelial keratitis; PEE punctate epithelial erosions; DES dry eye syndrome; MGD meibomian gland dysfunction; ATs artificial tears; PFAT's preservative free artificial tears; [place] nuclear sclerotic cataract; PSC posterior subcapsular cataract; ERM epi-retinal membrane; PVD posterior vitreous detachment; RD retinal detachment; DM diabetes mellitus; DR diabetic retinopathy; NPDR non-proliferative diabetic retinopathy; PDR proliferative diabetic retinopathy; CSME clinically significant macular edema; DME diabetic macular edema; dbh dot blot hemorrhages; CWS cotton wool spot; POAG primary open angle glaucoma; C/D cup-to-disc ratio; HVF humphrey visual field; GVF goldmann visual field; OCT optical coherence tomography; IOP intraocular pressure; BRVO Branch retinal  vein occlusion; CRVO central retinal vein occlusion; CRAO central retinal artery occlusion; BRAO branch retinal artery occlusion; RT retinal tear; SB scleral buckle; PPV pars plana vitrectomy; VH Vitreous hemorrhage; PRP panretinal laser photocoagulation; IVK intravitreal kenalog; VMT vitreomacular traction; MH Macular hole;  NVD neovascularization of the disc; NVE neovascularization elsewhere; AREDS age related eye disease study; ARMD age related macular degeneration; POAG primary  open angle glaucoma; EBMD epithelial/anterior basement membrane dystrophy; ACIOL anterior chamber intraocular lens; IOL intraocular lens; PCIOL posterior chamber intraocular lens; Phaco/IOL phacoemulsification with intraocular lens placement; [place] photorefractive keratectomy; LASIK laser assisted in situ keratomileusis; HTN hypertension; DM diabetes mellitus; COPD chronic obstructive pulmonary disease

## 2020-03-02 NOTE — Assessment & Plan Note (Signed)
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

## 2020-05-25 NOTE — Progress Notes (Signed)
Cardiology Office Note   Date:  05/26/2020   ID:  Warren Knock., DOB [birth year], MRN [id number]  PCP:  Aida Puffer, MD  Cardiologist:   [name], MD Referring:  Self  Chief Complaint  Patient presents with  . Atrial Fibrillation      History of Present Illness: Warren Kelley. is a 85 y.o. male who presents for evaluation of lower extremity swelling.  He has atrial fib and has been on warfarin.   At the last visit I ordered an echo.  LV function is OK.  There were no significant valvular abnormalities. He has aortic root enlargement.     Since he was last seen he has had no new complaints.  He likes to sleep on the couch because he likes to fall asleep watching TV.  He has his feet down a lot.  He eats a lot of processed food.  He does have lower extremity swelling.  We put him previously on Lasix and he thinks this has helped.  He is not having any discomfort associated with this.  He is not having any wounds or bleeding.  He does get some shortness of breath at night with some coughing when he lies down but he is not describing PND or orthopnea.  Is not having any chest pain.  He is not bothered by his fibrillation and tolerates anticoagulation without any evidence of bleeding.   Past Medical History:  Diagnosis Date  . Atrial fibrillation (HCC)   . Hearing loss     Past Surgical History:  Procedure Laterality Date  . CARDIOVERSION  08  . HERNIA REPAIR  09/10/2012   large umbilical hernia  . INSERTION OF MESH  09/10/2012   Procedure: INSERTION OF MESH;  Surgeon: [name], MD;  Location: [facility] OR;  Service: General;  Laterality: N/A;  . TONSILLECTOMY     "maybe" (09/10/2012)  . UMBILICAL HERNIA REPAIR  09/10/2012   Procedure: HERNIA REPAIR UMBILICAL ADULT;  Surgeon: [name], MD;  Location: [facility] OR;  Service: General;  Laterality: N/A;     Current Outpatient Medications  Medication Sig Dispense Refill  . fish oil-omega-3 fatty acids 1000  MG capsule Take 1 g by mouth daily.    . Multiple Vitamins-Minerals (MULTIVITAMIN WITH MINERALS) tablet Take 1 tablet by mouth daily.    Marland Kitchen warfarin (COUMADIN) 5 MG tablet Take 5 mg by mouth daily.     . furosemide (LASIX) 40 MG tablet Take 1 tablet (40 mg total) by mouth daily. 90 tablet 3   No current facility-administered medications for this visit.    Allergies:   Patient has no known allergies.    ROS:  Please see the history of present illness.   Otherwise, review of systems are positive for none.   All other systems are reviewed and negative.    PHYSICAL EXAM: VS:  BP 140/76   Pulse 77   Ht 6' (1.829 m)   Wt 247 lb (112 kg)   SpO2 97%   BMI 33.50 kg/m  , BMI Body mass index is 33.5 kg/m. GENERAL:  Well appearing NECK:  No jugular venous distention, waveform within normal limits, carotid upstroke brisk and symmetric, no bruits, no thyromegaly LUNGS:  Clear to auscultation bilaterally CHEST:  Unremarkable HEART:  PMI not displaced or sustained,S1 and S2 within normal limits, no S3, no clicks, no rubs, 2 out of 6 apical systolic murmur radiating slightly at the aortic outflow tract, no  diastolic murmurs, irregular ABD:  Flat, positive bowel sounds normal in frequency in pitch, no bruits, no rebound, no guarding, no midline pulsatile mass, no hepatomegaly, no splenomegaly EXT:  2 plus pulses throughout, moderate left greater than right leg edema with chronic venous stasis changes edema, no cyanosis no clubbing     EKG:  EKG is  ordered today. The ekg ordered today demonstrates atrial fibrillation, rate 77, axis rightward, intervals within normal limits, premature ventricular contraction.  No change from previous  Recent Labs: 11/02/2019: ALT 31; B Natriuretic Peptide 84.3; Hemoglobin 15.9; Platelets 181 12/04/2019: BUN 18; Creatinine, Ser 1.14; Potassium 4.4; Sodium 142    Lipid Panel No results found for: CHOL, TRIG, HDL, CHOLHDL, VLDL, LDLCALC, LDLDIRECT    Wt Readings  from Last 3 Encounters:  05/26/20 247 lb (112 kg)  11/22/19 245 lb (111.1 kg)  11/12/19 250 lb 12.8 oz (113.8 kg)      Other studies Reviewed: Additional studies/ records that were reviewed today include: NA. Review of the above records demonstrates:  Please see elsewhere in the note.     ASSESSMENT AND PLAN:  CHRONIC ATRIAL FIB:    Mr. Warren Wagner. has a CHA2DS2 - VASc score of 2.   No change in therapy.  His INR is followed by his primary provider.  LEG SWELLING:    We talked again extensively about salt and fluid restriction.  We talked about keeping his feet elevated.  He should stay on his current dose of Lasix.  I will ask his primary provider to draw a CBC and a basic metabolic profile when I see him in a few days for his INR check.   AORTIC ROOT ENLARGEMENT: This was 43 mm.  I will follow this probably with repeat imaging in the future although I am not sure that he will ever need to have replacement or would be a candidate with his advancing age.  COVID EDUCATION: He has had his vaccines.  Current medicines are reviewed at length with the patient today.  The patient does not have concerns regarding medicines.  The following changes have been made:   None  Labs/ tests ordered today include: None  Orders Placed This Encounter  Procedures  . EKG 12-Lead     Disposition:   FU with APP in one year.    Signed, [name], MD  05/26/2020 4:52 PM    [facility]

## 2020-05-26 ENCOUNTER — Other Ambulatory Visit: Payer: Self-pay

## 2020-05-26 ENCOUNTER — Encounter: Payer: Self-pay | Admitting: Cardiology

## 2020-05-26 ENCOUNTER — Ambulatory Visit: Payer: Medicare Other | Admitting: Cardiology

## 2020-05-26 VITALS — BP 140/76 | HR 77 | Ht 72.0 in | Wt 247.0 lb

## 2020-05-26 DIAGNOSIS — M7989 Other specified soft tissue disorders: Secondary | ICD-10-CM | POA: Diagnosis not present

## 2020-05-26 DIAGNOSIS — Z7189 Other specified counseling: Secondary | ICD-10-CM

## 2020-05-26 DIAGNOSIS — I482 Chronic atrial fibrillation, unspecified: Secondary | ICD-10-CM | POA: Diagnosis not present

## 2020-05-26 NOTE — Patient Instructions (Addendum)
Medication Instructions:  No changes *If you need a refill on your cardiac medications before your next appointment, please call your pharmacy*  Lab Work: None ordered at this visit  Testing/Procedures: None ordered at this visit  Follow-Up: At [facility], you and your health needs are our priority.  As part of our continuing mission to provide you with exceptional heart care, we have created designated Provider Care Teams.  These Care Teams include your primary Cardiologist (physician) and Advanced Practice Providers (APPs -  Physician Assistants and Nurse Practitioners) who all work together to provide you with the care you need, when you need it.  Your next appointment:   12 month(s)  The format for your next appointment:   In Person  Provider:   Any available PA or NP  Other Instructions Please have Dr. Fredirick Maudlin office draw a BMP and CBC when INR is checked next.

## 2020-06-01 ENCOUNTER — Encounter (INDEPENDENT_AMBULATORY_CARE_PROVIDER_SITE_OTHER): Payer: Medicare Other | Admitting: Ophthalmology

## 2020-06-29 ENCOUNTER — Ambulatory Visit (INDEPENDENT_AMBULATORY_CARE_PROVIDER_SITE_OTHER): Payer: Medicare Other | Admitting: Ophthalmology

## 2020-06-29 ENCOUNTER — Other Ambulatory Visit: Payer: Self-pay

## 2020-06-29 ENCOUNTER — Encounter (INDEPENDENT_AMBULATORY_CARE_PROVIDER_SITE_OTHER): Payer: Self-pay | Admitting: Ophthalmology

## 2020-06-29 DIAGNOSIS — H353221 Exudative age-related macular degeneration, left eye, with active choroidal neovascularization: Secondary | ICD-10-CM | POA: Diagnosis not present

## 2020-06-29 DIAGNOSIS — H353133 Nonexudative age-related macular degeneration, bilateral, advanced atrophic without subfoveal involvement: Secondary | ICD-10-CM

## 2020-06-29 DIAGNOSIS — H353222 Exudative age-related macular degeneration, left eye, with inactive choroidal neovascularization: Secondary | ICD-10-CM | POA: Diagnosis not present

## 2020-06-29 NOTE — Assessment & Plan Note (Signed)
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

## 2020-06-29 NOTE — Assessment & Plan Note (Signed)
Table macular configuration in each eye with pigment epithelial detachments, no CNVM

## 2020-06-29 NOTE — Progress Notes (Signed)
06/29/2020     CHIEF COMPLAINT Patient presents for Retina Follow Up   HISTORY OF PRESENT ILLNESS: Warren Tsang. is a 85 y.o. male who presents to the clinic today for:   HPI    Retina Follow Up    Patient presents with  Dry AMD.  In both eyes.  Severity is moderate.  Duration of 4 months.  Since onset it is stable.  I, the attending physician,  performed the HPI with the patient and updated documentation appropriately.          Comments    4 Month f\u OU. OCT  Pt states vision is about the same. Denies floaters and FOL.        Last edited by Elyse Jarvis on 06/29/2020 10:47 AM. (History)      Referring physician: Aida Puffer, MD [address] [place],  [place] [address]  HISTORICAL INFORMATION:   Selected notes from the MEDICAL RECORD NUMBER       CURRENT MEDICATIONS: No current outpatient medications on file. (Ophthalmic Drugs)   No current facility-administered medications for this visit. (Ophthalmic Drugs)   Current Outpatient Medications (Other)  Medication Sig  . fish oil-omega-3 fatty acids 1000 MG capsule Take 1 g by mouth daily.  . furosemide (LASIX) 40 MG tablet Take 1 tablet (40 mg total) by mouth daily.  . Multiple Vitamins-Minerals (MULTIVITAMIN WITH MINERALS) tablet Take 1 tablet by mouth daily.  Marland Kitchen warfarin (COUMADIN) 5 MG tablet Take 5 mg by mouth daily.    No current facility-administered medications for this visit. (Other)      REVIEW OF SYSTEMS:    ALLERGIES No Known Allergies  PAST MEDICAL HISTORY Past Medical History:  Diagnosis Date  . Atrial fibrillation (HCC)   . Hearing loss    Past Surgical History:  Procedure Laterality Date  . CARDIOVERSION  08  . HERNIA REPAIR  09/10/2012   large umbilical hernia  . INSERTION OF MESH  09/10/2012   Procedure: INSERTION OF MESH;  Surgeon: [name], MD;  Location: [facility] OR;  Service: General;  Laterality: N/A;  . TONSILLECTOMY     "maybe" (09/10/2012)  . UMBILICAL HERNIA  REPAIR  09/10/2012   Procedure: HERNIA REPAIR UMBILICAL ADULT;  Surgeon: [name], MD;  Location: [facility] OR;  Service: General;  Laterality: N/A;    FAMILY HISTORY History reviewed. No pertinent family history.  SOCIAL HISTORY Social History   Tobacco Use  . Smoking status: Former Smoker    Packs/day: 1.50    Years: 10.00    Pack years: 15.00    Types: Cigarettes    Quit date: 07/27/1982    Years since quitting: 37.9  . Smokeless tobacco: Never Used  Substance Use Topics  . Alcohol use: No  . Drug use: No         OPHTHALMIC EXAM: Base Eye Exam    Visual Acuity (Snellen - Linear)      Right Left   Dist cc 20/80 E Card @ 4'   Dist ph cc 20/40 -2 NI   Correction: Glasses       Tonometry (Tonopen, 10:51 AM)      Right Left   Pressure 11 14       Pupils      Pupils Dark Light Shape React APD   Right PERRL 4 3 Round Slow None   Left PERRL 4 3 Round Sluggish None       Visual Fields (Counting  fingers)      Left Right    Full Full       Neuro/Psych    Oriented x3: Yes   Mood/Affect: Normal       Dilation    Both eyes: 1.0% Mydriacyl, 2.5% Phenylephrine @ 10:51 AM        Slit Lamp and Fundus Exam    External Exam      Right Left   External Normal Normal       Slit Lamp Exam      Right Left   Lids/Lashes Normal Normal   Conjunctiva/Sclera White and quiet White and quiet   Cornea Clear Clear   Anterior Chamber Deep and quiet Deep and quiet   Iris Round and reactive Round and reactive   Lens Posterior chamber intraocular lens Posterior chamber intraocular lens   Anterior Vitreous Normal Normal       Fundus Exam      Right Left   Posterior Vitreous Posterior vitreous detachment Posterior vitreous detachment   Disc Normal Normal   C/D Ratio 0.25 0.3   Macula Soft drusen, Retinal pigment epithelial mottling Soft drusen, Retinal pigment epithelial mottling   Vessels Normal Normal   Periphery Normal Normal          IMAGING AND PROCEDURES    Imaging and Procedures for 06/29/20  OCT, Retina - OU - Both Eyes       Right Eye Quality was good. Scan locations included subfoveal. Central Foveal Thickness: 259. Progression has been stable. Findings include abnormal foveal contour, retinal drusen , pigment epithelial detachment.   Left Eye Quality was good. Scan locations included subfoveal. Central Foveal Thickness: 206. Progression has been stable. Findings include abnormal foveal contour, pigment epithelial detachment, retinal drusen .   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 Exudative age-related macular degeneration of left eye with active choroidal neovascularization (HCC)  H35.3221 OCT, Retina - OU - Both Eyes  2. 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  2.  3.  Ophthalmic Meds Ordered this visit:  No orders of the defined types were placed in this encounter.      Return in about 6 months (around 12/27/2020) for DILATE OU, OCT.  There are no Patient Instructions on file for this visit.   Explained the diagnoses, plan, and follow up with the patient and they expressed understanding.  Patient expressed understanding of the  importance of proper follow up care.   Alford Highland Thecla Forgione M.D. Diseases & Surgery of the Retina and Vitreous [facility] 06/29/20     Abbreviations: M myopia (nearsighted); A astigmatism; H hyperopia (farsighted); P presbyopia; Mrx spectacle prescription;  CTL contact lenses; OD right eye; OS left eye; OU both eyes  XT exotropia; ET esotropia; PEK  punctate epithelial keratitis; PEE punctate epithelial erosions; DES dry eye syndrome; MGD meibomian gland dysfunction; ATs artificial tears; PFAT's preservative free artificial tears; NSC nuclear sclerotic cataract; PSC posterior subcapsular cataract; ERM epi-retinal membrane; PVD posterior vitreous detachment; RD retinal detachment; DM diabetes mellitus; DR diabetic retinopathy; NPDR non-proliferative diabetic retinopathy; PDR proliferative diabetic retinopathy; CSME clinically significant macular edema; DME diabetic macular edema; dbh dot blot hemorrhages; CWS cotton wool spot; POAG primary open angle glaucoma; C/D cup-to-disc ratio; HVF humphrey visual field; GVF goldmann visual field; OCT optical coherence tomography; IOP intraocular pressure; BRVO Branch retinal vein occlusion; CRVO central retinal vein occlusion; CRAO central retinal artery occlusion; BRAO branch retinal artery occlusion; RT retinal tear; SB scleral buckle; PPV pars plana vitrectomy; VH Vitreous hemorrhage; PRP panretinal laser photocoagulation; IVK intravitreal kenalog; VMT vitreomacular traction; MH Macular hole;  NVD neovascularization of the disc; NVE neovascularization elsewhere; AREDS age related eye disease study; ARMD age related macular degeneration; POAG primary open angle glaucoma; EBMD epithelial/anterior basement membrane dystrophy; ACIOL anterior chamber intraocular lens; IOL intraocular lens; PCIOL posterior chamber intraocular lens; Phaco/IOL phacoemulsification with intraocular lens placement; PRK photorefractive keratectomy; LASIK laser assisted in situ  keratomileusis; HTN hypertension; DM diabetes mellitus; COPD chronic obstructive pulmonary disease

## 2020-06-29 NOTE — Patient Instructions (Signed)
To notify the office promptly if new onset visual acuity distortion or decline

## 2020-11-11 ENCOUNTER — Other Ambulatory Visit: Payer: Self-pay | Admitting: *Deleted

## 2020-11-11 DIAGNOSIS — I77819 Aortic ectasia, unspecified site: Secondary | ICD-10-CM

## 2020-11-19 ENCOUNTER — Other Ambulatory Visit: Payer: Self-pay | Admitting: *Deleted

## 2020-11-19 DIAGNOSIS — I482 Chronic atrial fibrillation, unspecified: Secondary | ICD-10-CM

## 2020-11-20 ENCOUNTER — Telehealth: Payer: Self-pay | Admitting: Family Medicine

## 2020-11-20 NOTE — Telephone Encounter (Signed)
Pre-cert Verification for the following procedure    CT ANGIO CHEST AORTA W/CM OR WO/CM  DATE- 11/26/2020  LOCATION:  [facility]

## 2020-11-26 ENCOUNTER — Ambulatory Visit (HOSPITAL_COMMUNITY): Payer: Medicare Other

## 2020-12-14 ENCOUNTER — Ambulatory Visit (HOSPITAL_COMMUNITY)
Admission: RE | Admit: 2020-12-14 | Discharge: 2020-12-14 | Disposition: A | Discharge status: Home / Self Care | Payer: Medicare Other | Source: Ambulatory Visit | Attending: Family Medicine | Admitting: Family Medicine

## 2020-12-14 ENCOUNTER — Other Ambulatory Visit: Payer: Self-pay

## 2020-12-14 DIAGNOSIS — I77819 Aortic ectasia, unspecified site: Secondary | ICD-10-CM | POA: Diagnosis present

## 2020-12-14 LAB — POCT I-STAT CREATININE: Creatinine, Ser: 1 mg/dL (ref 0.61–1.24)

## 2020-12-14 MED ORDER — IOHEXOL 350 MG/ML SOLN
84.0000 mL | Freq: Once | INTRAVENOUS | Status: AC | PRN
  Administered 2020-12-14: 84 mL via INTRAVENOUS

## 2020-12-18 ENCOUNTER — Telehealth: Payer: Self-pay | Admitting: *Deleted

## 2020-12-18 DIAGNOSIS — M7989 Other specified soft tissue disorders: Secondary | ICD-10-CM

## 2020-12-18 DIAGNOSIS — I482 Chronic atrial fibrillation, unspecified: Secondary | ICD-10-CM

## 2020-12-18 DIAGNOSIS — K7689 Other specified diseases of liver: Secondary | ICD-10-CM

## 2020-12-18 NOTE — Telephone Encounter (Signed)
-----   Message from Netta Neat., NP sent at 12/15/2020  8:16 AM EST ----- ISTAT creatinine looked good

## 2020-12-18 NOTE — Telephone Encounter (Signed)
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 Tell him we will do yearly CT scans to check. Also there appears to be some nodularity on the liver which could be seen with cirrhosis. Tell him we will order liver function test to check. He may need to be referred to gastroenterology specialist if liver function tests are abnormal. Please go ahead and order LFTs on the patient please.

## 2020-12-18 NOTE — Addendum Note (Signed)
Addended by: Eustace Moore on: 12/18/2020 08:13 AM   Modules accepted: Orders

## 2020-12-18 NOTE — Telephone Encounter (Signed)
Patient informed and verbalized understanding. Will have lab done at [facility] office. Copy sent to PCP

## 2020-12-18 NOTE — Telephone Encounter (Signed)
Patient's daughter [name] informed and verbalized understanding of plan.

## 2020-12-23 LAB — HEPATIC FUNCTION PANEL
ALT: 29 IU/L (ref 0–44)
AST: 32 IU/L (ref 0–40)
Albumin: 4 g/dL (ref 3.6–4.6)
Alkaline Phosphatase: 80 IU/L (ref 44–121)
Bilirubin Total: 0.8 mg/dL (ref 0.0–1.2)
Bilirubin, Direct: 0.21 mg/dL (ref 0.00–0.40)
Total Protein: 6.6 g/dL (ref 6.0–8.5)

## 2020-12-28 ENCOUNTER — Encounter (INDEPENDENT_AMBULATORY_CARE_PROVIDER_SITE_OTHER): Payer: Self-pay | Admitting: Ophthalmology

## 2020-12-28 ENCOUNTER — Other Ambulatory Visit: Payer: Self-pay

## 2020-12-28 ENCOUNTER — Ambulatory Visit (INDEPENDENT_AMBULATORY_CARE_PROVIDER_SITE_OTHER): Payer: Medicare Other | Admitting: Ophthalmology

## 2020-12-28 DIAGNOSIS — H353222 Exudative age-related macular degeneration, left eye, with inactive choroidal neovascularization: Secondary | ICD-10-CM | POA: Diagnosis not present

## 2020-12-28 DIAGNOSIS — H353133 Nonexudative age-related macular degeneration, bilateral, advanced atrophic without subfoveal involvement: Secondary | ICD-10-CM | POA: Diagnosis not present

## 2020-12-28 DIAGNOSIS — H43813 Vitreous degeneration, bilateral: Secondary | ICD-10-CM

## 2020-12-28 DIAGNOSIS — H353221 Exudative age-related macular degeneration, left eye, with active choroidal neovascularization: Secondary | ICD-10-CM | POA: Diagnosis not present

## 2020-12-28 NOTE — Assessment & Plan Note (Signed)

## 2020-12-28 NOTE — Progress Notes (Signed)
12/28/2020     CHIEF COMPLAINT Patient presents for Retina Follow Up (6 Mo F/U OU//Pt sts OS VA "may be a little better." Pt reports stable VA OD. No new symptoms reported OU.)   HISTORY OF PRESENT ILLNESS: Warren Wagner. is a 86 y.o. male who presents to the clinic today for:   HPI    Retina Follow Up    Patient presents with  Wet AMD.  In left eye.  This started 6 months ago.  Severity is moderate.  Duration of 6 months.  Since onset it is stable. Additional comments: 6 Mo F/U OU  Pt sts OS VA "may be a little better." Pt reports stable VA OD. No new symptoms reported OU.       Last edited by Ileana Roup, COA on 12/28/2020 10:43 AM. (History)      Referring physician: Aida Puffer, MD [address] [place],  [place] [address]  HISTORICAL INFORMATION:   Selected notes from the MEDICAL RECORD NUMBER       CURRENT MEDICATIONS: No current outpatient medications on file. (Ophthalmic Drugs)   No current facility-administered medications for this visit. (Ophthalmic Drugs)   Current Outpatient Medications (Other)  Medication Sig  . fish oil-omega-3 fatty acids 1000 MG capsule Take 1 g by mouth daily.  . furosemide (LASIX) 40 MG tablet Take 1 tablet (40 mg total) by mouth daily.  . Multiple Vitamins-Minerals (MULTIVITAMIN WITH MINERALS) tablet Take 1 tablet by mouth daily.  Marland Kitchen warfarin (COUMADIN) 5 MG tablet Take 5 mg by mouth daily.    No current facility-administered medications for this visit. (Other)      REVIEW OF SYSTEMS:    ALLERGIES No Known Allergies  PAST MEDICAL HISTORY Past Medical History:  Diagnosis Date  . Atrial fibrillation (HCC)   . Hearing loss    Past Surgical History:  Procedure Laterality Date  . CARDIOVERSION  08  . HERNIA REPAIR  09/10/2012   large umbilical hernia  . INSERTION OF MESH  09/10/2012   Procedure: INSERTION OF MESH;  Surgeon: [name], MD;  Location: [facility] OR;  Service: General;  Laterality: N/A;  .  TONSILLECTOMY     "maybe" (09/10/2012)  . UMBILICAL HERNIA REPAIR  09/10/2012   Procedure: HERNIA REPAIR UMBILICAL ADULT;  Surgeon: [name], MD;  Location: [facility] OR;  Service: General;  Laterality: N/A;    FAMILY HISTORY History reviewed. No pertinent family history.  SOCIAL HISTORY Social History   Tobacco Use  . Smoking status: Former Smoker    Packs/day: 1.50    Years: 10.00    Pack years: 15.00    Types: Cigarettes    Quit date: 07/27/1982    Years since quitting: 38.4  . Smokeless tobacco: Never Used  Substance Use Topics  . Alcohol use: No  . Drug use: No         OPHTHALMIC EXAM: Base Eye Exam    Visual Acuity (ETDRS)      Right Left   Dist cc 20/40 E card @ 4'   Dist ph cc NI NI   Correction: Glasses       Tonometry (Tonopen, 10:43 AM)      Right Left   Pressure 13 15       Pupils      Dark Light Shape React APD   Right 5 4 Round Slow None   Left 3.5 3.5 Round Minimal None       Visual Fields (  Counting fingers)      Left Right    Full Full       Extraocular Movement      Right Left    Full Full       Neuro/Psych    Oriented x3: Yes   Mood/Affect: Normal       Dilation    Both eyes: 1.0% Mydriacyl, 2.5% Phenylephrine @ 10:48 AM        Slit Lamp and Fundus Exam    External Exam      Right Left   External Normal Normal       Slit Lamp Exam      Right Left   Lids/Lashes Normal Normal   Conjunctiva/Sclera White and quiet White and quiet   Cornea Clear Clear   Anterior Chamber Deep and quiet Deep and quiet   Iris Round and reactive Round and reactive   Lens Posterior chamber intraocular lens Posterior chamber intraocular lens   Anterior Vitreous Normal Normal       Fundus Exam      Right Left   Posterior Vitreous Posterior vitreous detachment Posterior vitreous detachment   Disc Normal Normal   C/D Ratio 0.25 0.3   Macula Soft drusen, Retinal pigment epithelial mottling Soft drusen, Retinal pigment epithelial mottling    Vessels Normal Normal   Periphery Normal Normal          IMAGING AND PROCEDURES  Imaging and Procedures for 12/28/20  OCT, Retina - OU - Both Eyes       Right Eye Quality was good. Scan locations included subfoveal. Central Foveal Thickness: 260. Progression has been stable. Findings include inner retinal atrophy, outer retinal atrophy, central retinal atrophy.   Left Eye Quality was good. Scan locations included subfoveal. Central Foveal Thickness: 181. Progression has been stable. Findings include abnormal foveal contour, retinal drusen , outer retinal atrophy, central retinal atrophy, inner retinal atrophy.   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  An informational brochure was given to the patient.  All the patient's questions were answered.  The patient was asked to return if new or different flashes or floaters develops.   Patient was instructed to contact office immediately if any changes were noticed. I explained to the patient that vitreous inside the eye is similar to jello inside a bowl. As the jello melts it can start to pull away from the bowl, similarly the vitreous throughout our lives can begin to pull away from the retina. That process is called a posterior vitreous detachment. In some cases, the vitreous can tug hard enough on the retina to form a retinal tear. I discussed with the patient the signs and symptoms of a retinal detachment.  Do not rub the  eye.      ICD-10-CM   1. Exudative age-related macular degeneration of left eye with active choroidal neovascularization (HCC)  H35.3221 OCT, Retina - OU - Both Eyes  2. Exudative age-related macular degeneration of left eye with inactive choroidal  neovascularization (HCC)  H35.3222   3. 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 Posterior vitreous detachment of both eyes  H43.813     1.  Patient instructed to contact the office promptly for new onset visual acuity declines or distortions in either eye  2.  3.  Ophthalmic Meds Ordered this visit:  No orders of the defined types were placed in this encounter.      Return in about 6 months (around 06/30/2021) for DILATE OU, OCT.  There are no Patient Instructions on file for this visit.   Explained the diagnoses, plan, and follow up with the patient and they expressed understanding.  Patient expressed understanding of the importance of proper follow up care.   Alford Highland [name] M.D. Diseases & Surgery of the Retina and Vitreous [facility] 12/28/20     Abbreviations: M myopia (nearsighted); A astigmatism; H hyperopia (farsighted); P presbyopia; Mrx spectacle prescription;  CTL contact lenses; OD right eye; OS left eye; OU both eyes  XT exotropia; ET esotropia; PEK punctate epithelial keratitis; PEE punctate epithelial erosions; DES dry eye syndrome; MGD meibomian gland dysfunction; ATs artificial tears; PFAT's preservative free artificial tears; NSC nuclear sclerotic cataract; PSC posterior subcapsular cataract; ERM epi-retinal membrane; PVD posterior vitreous detachment; RD retinal detachment; DM diabetes mellitus; DR diabetic retinopathy; NPDR non-proliferative diabetic retinopathy; PDR proliferative diabetic retinopathy; CSME clinically significant macular edema; DME diabetic macular edema; dbh dot blot hemorrhages; CWS cotton wool spot; POAG  primary open angle glaucoma; C/D cup-to-disc ratio; HVF humphrey visual field; GVF goldmann visual field; OCT optical coherence tomography; IOP intraocular pressure; BRVO Branch retinal vein occlusion; CRVO central retinal vein occlusion; CRAO central retinal artery occlusion; BRAO branch retinal artery occlusion; RT retinal tear; SB scleral buckle; PPV pars plana vitrectomy; VH Vitreous hemorrhage; PRP panretinal laser photocoagulation; IVK intravitreal kenalog; VMT vitreomacular traction; MH Macular hole;  NVD neovascularization of the disc; NVE neovascularization elsewhere; AREDS age related eye disease study; ARMD age related macular degeneration; POAG primary open angle glaucoma; EBMD epithelial/anterior basement membrane dystrophy; ACIOL anterior chamber intraocular lens; IOL intraocular lens; PCIOL posterior chamber intraocular lens; Phaco/IOL phacoemulsification with intraocular lens placement; PRK photorefractive keratectomy; LASIK laser assisted in situ keratomileusis; HTN hypertension; DM diabetes mellitus; COPD chronic obstructive pulmonary disease

## 2020-12-28 NOTE — Assessment & Plan Note (Signed)
No signs of new CNVM

## 2020-12-28 NOTE — Assessment & Plan Note (Signed)
No signs of recurrence 

## 2021-01-11 ENCOUNTER — Ambulatory Visit (INDEPENDENT_AMBULATORY_CARE_PROVIDER_SITE_OTHER): Payer: Medicare Other | Admitting: Internal Medicine

## 2021-01-11 ENCOUNTER — Other Ambulatory Visit: Payer: Self-pay

## 2021-01-11 ENCOUNTER — Encounter: Payer: Self-pay | Admitting: Internal Medicine

## 2021-01-11 VITALS — BP 136/84 | HR 71 | Temp 97.3°F | Resp 20 | Wt 242.0 lb

## 2021-01-11 DIAGNOSIS — Z7689 Persons encountering health services in other specified circumstances: Secondary | ICD-10-CM

## 2021-01-11 DIAGNOSIS — I4891 Unspecified atrial fibrillation: Secondary | ICD-10-CM | POA: Diagnosis not present

## 2021-01-11 DIAGNOSIS — I10 Essential (primary) hypertension: Secondary | ICD-10-CM

## 2021-01-11 DIAGNOSIS — Z7901 Long term (current) use of anticoagulants: Secondary | ICD-10-CM | POA: Diagnosis not present

## 2021-01-11 NOTE — Progress Notes (Signed)
  Subjective:    Warren Wagner. - 86 y.o. male MRN [id number]  Date of birth: Jun 18, 1935  HPI  Warren Wagner. is to establish care. Patient has a PMH significant for Afib on Coumadin. Was previously going to Dr. Clarene Duke with [facility] but he is nearing retirement. Recently diagnosed with HTN and started on Metoprolol.   ROS per HPI   Health Maintenance:  Health Maintenance Due  Topic Date Due  . COVID-19 Vaccine (1) Never done  . TETANUS/TDAP  Never done  . PNA vac Low Risk Adult (1 of 2 - PCV13) Never done     Past Medical History: Patient Active Problem List   Diagnosis Date Noted  . 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 Posterior vitreous detachment of both eyes 03/02/2020  . Hematoma complicating a procedure 12/04/2012  . Umbilical hernia 07/27/2012  . Atrial fibrillation (HCC) 07/27/2012      Social History   reports that he quit smoking about 38 years ago. His smoking use included cigarettes. He has a 15.00 pack-year smoking history. He has never used smokeless tobacco. He reports that he does not drink alcohol and does not use drugs.   Family History  family history is not on file.   Medications: reviewed and updated   Objective:   Physical Exam BP 136/84   Pulse 71   Temp (!) 97.3 F (36.3 C)   Resp 20   Wt 242 lb (109.8 kg)   SpO2 94%   BMI 32.82 kg/m  Physical Exam Constitutional:      General: He is not in acute distress.    Appearance: He is not diaphoretic.  Cardiovascular:     Rate and Rhythm: Normal rate.  Pulmonary:     Effort: Pulmonary effort is normal. No respiratory distress.  Musculoskeletal:        General: Normal range of motion.  Skin:    General: Skin is warm and dry.  Neurological:     Mental Status: He is alert  and oriented to person, place, and time.  Psychiatric:        Mood and Affect: Affect normal.        Judgment: Judgment normal.         Assessment & Plan:   1. Encounter to establish care Reviewed patient's PMH, social history, surgical history, and medications.  Is overdue for annual exam, screening blood work, and health maintenance topics. Have asked patient to return for visit to address these items.   2. Atrial fibrillation, unspecified type (HCC) 3.On Coumadin for atrial fibrillation [facility]) Will refer to Butch Penny, PharmD to begin INR monitoring.   4. Essential hypertension BP well controlled. Asymptomatic. Continue current regimen.  Counseled on blood pressure goal of less than 130/80, low-sodium, DASH diet, medication compliance, 150 minutes of moderate intensity exercise per week. Discussed medication compliance, adverse effects.     Marcy Siren, D.O. 01/11/2021, 3:00 PM Primary Care at [facility]

## 2021-01-11 NOTE — Progress Notes (Signed)
Concerns with SOB And high BP

## 2021-01-18 ENCOUNTER — Ambulatory Visit: Payer: Medicare Other | Admitting: Pharmacist

## 2021-02-16 ENCOUNTER — Other Ambulatory Visit: Payer: Self-pay | Admitting: Cardiology

## 2021-02-17 ENCOUNTER — Other Ambulatory Visit: Payer: Self-pay

## 2021-02-17 DIAGNOSIS — I4891 Unspecified atrial fibrillation: Secondary | ICD-10-CM

## 2021-02-19 ENCOUNTER — Other Ambulatory Visit: Payer: Self-pay | Admitting: Cardiology

## 2021-02-19 ENCOUNTER — Other Ambulatory Visit: Payer: Self-pay

## 2021-02-19 ENCOUNTER — Ambulatory Visit (INDEPENDENT_AMBULATORY_CARE_PROVIDER_SITE_OTHER): Payer: Medicare Other

## 2021-02-19 DIAGNOSIS — Z7901 Long term (current) use of anticoagulants: Secondary | ICD-10-CM | POA: Diagnosis not present

## 2021-02-19 DIAGNOSIS — I4891 Unspecified atrial fibrillation: Secondary | ICD-10-CM | POA: Diagnosis not present

## 2021-02-19 LAB — POCT INR: INR: 2 (ref 2.0–3.0)

## 2021-02-19 MED ORDER — WARFARIN SODIUM 5 MG PO TABS: ORAL_TABLET | ORAL | 1 refills | Status: DC

## 2021-02-19 NOTE — Patient Instructions (Signed)
Take 1 tablet Daily.  Recheck INR 1 week.  A full discussion of the nature of anticoagulants has been carried out.  A benefit risk analysis has been presented to the patient, so that they understand the justification for choosing anticoagulation at this time. The need for frequent and regular monitoring, precise dosage adjustment and compliance is stressed.  Side effects of potential bleeding are discussed.  The patient should avoid any OTC items containing aspirin or ibuprofen, and should avoid great swings in general diet.  Avoid alcohol consumption.  [phone]

## 2021-03-01 ENCOUNTER — Ambulatory Visit (INDEPENDENT_AMBULATORY_CARE_PROVIDER_SITE_OTHER): Payer: Medicare Other

## 2021-03-01 ENCOUNTER — Other Ambulatory Visit: Payer: Self-pay

## 2021-03-01 DIAGNOSIS — Z7901 Long term (current) use of anticoagulants: Secondary | ICD-10-CM | POA: Diagnosis not present

## 2021-03-01 DIAGNOSIS — I4891 Unspecified atrial fibrillation: Secondary | ICD-10-CM

## 2021-03-01 LAB — POCT INR: INR: 3.2 — AB (ref 2.0–3.0)

## 2021-03-01 NOTE — Patient Instructions (Signed)
Take 1 tablet Daily.  Recheck INR 4 weeks.  Eat [employer].  [phone]

## 2021-04-09 ENCOUNTER — Ambulatory Visit (INDEPENDENT_AMBULATORY_CARE_PROVIDER_SITE_OTHER): Payer: Medicare Other

## 2021-04-09 ENCOUNTER — Other Ambulatory Visit: Payer: Self-pay

## 2021-04-09 DIAGNOSIS — Z7901 Long term (current) use of anticoagulants: Secondary | ICD-10-CM

## 2021-04-09 DIAGNOSIS — I4891 Unspecified atrial fibrillation: Secondary | ICD-10-CM

## 2021-04-09 LAB — POCT INR: INR: 5.6 — AB (ref 2.0–3.0)

## 2021-04-09 NOTE — Patient Instructions (Signed)
Hold Saturday, Sunday and Monday and then decrease to 1 tablet Daily except 0.5 tablet on Monday and Friday.  Recheck INR 2 weeks.  Eat [employer].  [phone]

## 2021-04-21 ENCOUNTER — Other Ambulatory Visit: Payer: Self-pay

## 2021-04-21 ENCOUNTER — Ambulatory Visit (INDEPENDENT_AMBULATORY_CARE_PROVIDER_SITE_OTHER): Payer: Medicare Other

## 2021-04-21 DIAGNOSIS — I4891 Unspecified atrial fibrillation: Secondary | ICD-10-CM

## 2021-04-21 DIAGNOSIS — Z7901 Long term (current) use of anticoagulants: Secondary | ICD-10-CM

## 2021-04-21 LAB — POCT INR: INR: 2.7 (ref 2.0–3.0)

## 2021-04-21 NOTE — Patient Instructions (Signed)
Continue taking 1 tablet Daily except 0.5 tablet on Monday and Friday.  Recheck INR 4 weeks.   [phone]

## 2021-05-07 ENCOUNTER — Ambulatory Visit
Admission: RE | Admit: 2021-05-07 | Discharge: 2021-05-07 | Disposition: A | Discharge status: Home / Self Care | Payer: Medicare Other | Source: Ambulatory Visit

## 2021-05-07 ENCOUNTER — Other Ambulatory Visit: Payer: Self-pay

## 2021-05-07 VITALS — BP 125/86 | HR 72 | Temp 97.7°F | Resp 18

## 2021-05-07 DIAGNOSIS — H109 Unspecified conjunctivitis: Secondary | ICD-10-CM | POA: Diagnosis not present

## 2021-05-07 DIAGNOSIS — B9689 Other specified bacterial agents as the cause of diseases classified elsewhere: Secondary | ICD-10-CM

## 2021-05-07 HISTORY — DX: Benign prostatic hyperplasia without lower urinary tract symptoms: N40.0

## 2021-05-07 HISTORY — DX: Essential (primary) hypertension: I10

## 2021-05-07 MED ORDER — CIPROFLOXACIN HCL 0.3 % OP SOLN: 1.0000 [drp] | OPHTHALMIC | 0 refills | Status: DC

## 2021-05-07 NOTE — ED Triage Notes (Signed)
Pt c/o redness to eye. States has allergies and eye can be pink/red at baseline but has become worst. States it itches. There is appreciable drainage green/yellow-tint that is viscous.

## 2021-05-07 NOTE — ED Provider Notes (Signed)
[facility]    CSN: [id number] Arrival date & time: 05/07/21  1709      History   Chief Complaint Chief Complaint  Patient presents with   Eye Drainage   appt 5    HPI Warren Wagner. is a 86 y.o. male.   Patient presents today accompanied by his daughter who helps provide the majority of history.  Reports a weeklong history of eye irritation and drainage.  He denies any recent illness or congestion symptoms.  He does have a history of macular degeneration as well as visual disturbance and states this is at baseline; denies any recent worsening of vision or ocular pain.  He has not tried any over-the-counter medication for symptom management.  He denies any foreign body sensation.  He is not wearing any glasses and does not wear contact lenses.  He is followed by ophthalmology but has not seen them recently as he was not improving with previous treatment plan.  He does have a history of allergies and often has similar symptoms but states current symptoms are more extreme than previous episodes of this condition.  Denies any known sick contacts.   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 03/02/2020   Posterior vitreous detachment of both eyes 03/02/2020   Atrial fibrillation (HCC) 07/27/2012    Past Surgical History:  Procedure Laterality Date   CARDIOVERSION  08   HERNIA REPAIR  09/10/2012   large umbilical hernia   INSERTION OF MESH  09/10/2012   Procedure: INSERTION OF MESH;  Surgeon:  [name], MD;  Location: [facility] OR;  Service: General;  Laterality: N/A;   TONSILLECTOMY     "maybe" (09/10/2012)   UMBILICAL HERNIA REPAIR  09/10/2012   Procedure: HERNIA REPAIR UMBILICAL ADULT;  Surgeon: [name], MD;  Location: [facility] OR;  Service: General;  Laterality: N/A;       Home Medications    Prior to Admission medications   Medication Sig Start Date End Date Taking? Authorizing Provider  cetirizine (ZYRTEC) 10 MG tablet Take 10 mg by mouth daily.   Yes [provider]  ciprofloxacin (CILOXAN) 0.3 % ophthalmic solution Place 1 drop into both eyes every 2 (two) hours. Administer 1 drop, every 2 hours, while awake, for 2 days. Then 1 drop, every 4 hours, while awake, for the next 5 days. 05/07/21  Yes Maggy Wyble K, PA-C  finasteride (PROSCAR) 5 MG tablet Take 5 mg by mouth daily.   Yes [provider]  fish oil-omega-3 fatty acids 1000 MG capsule Take 1 g by mouth daily.    [provider]  furosemide (LASIX) 40 MG tablet Take 1 tablet by mouth once daily 02/19/21   [name], MD  metoprolol succinate (TOPROL-XL) 50 MG 24 hr tablet Take 50 mg by mouth daily. Take with or immediately following a meal.    [provider]  Multiple Vitamins-Minerals (MULTIVITAMIN WITH MINERALS) tablet Take 1 tablet by mouth daily.    [provider]  warfarin (COUMADIN) 5 MG tablet  Take 1-2 tablets or as prescribed by [facility] 02/19/21   [name], MD    Family History History reviewed. No pertinent family history.  Social History Social History   Tobacco Use   Smoking status: Former    Packs/day: 1.50    Years: 10.00    Pack years: 15.00    Types: Cigarettes    Quit date: 07/27/1982    Years since quitting: 38.8   Smokeless tobacco: Never  Substance Use Topics   Alcohol use: No   Drug use: No     Allergies   Patient has no known allergies.   Review of Systems Review of Systems  Constitutional:  Negative for  activity change, appetite change, fatigue and fever.  HENT:  Negative for congestion, sinus pressure, sneezing and sore throat.   Eyes:  Positive for discharge, redness, itching and visual disturbance (chronic). Negative for photophobia.  Respiratory:  Negative for cough and shortness of breath.   Cardiovascular:  Negative for chest pain.  Gastrointestinal:  Negative for abdominal pain, diarrhea, nausea and vomiting.  Musculoskeletal:  Negative for arthralgias and myalgias.  Neurological:  Negative for dizziness, light-headedness and headaches.    Physical Exam Triage Vital Signs ED Triage Vitals [05/07/21 1815]  Enc Vitals Group     BP 125/86     Pulse Rate 72     Resp 18     Temp 97.7 F (36.5 C)     Temp Source Oral     SpO2 94 %     Weight      Height      Head Circumference      Peak Flow      Pain Score 0     Pain Loc      Pain Edu?      Excl. in GC?    No data found.  Updated Vital Signs BP 125/86 (BP Location: Left Arm)   Pulse 72   Temp 97.7 F (36.5 C) (Oral)   Resp 18   SpO2 94%   Visual Acuity Right Eye Distance:   Left Eye Distance:   Bilateral Distance:    Right Eye Near:   Left Eye Near:    Bilateral Near:     Physical Exam Vitals reviewed.  Constitutional:      General: He is awake.     Appearance: Normal appearance. He is normal weight. He is not ill-appearing.     Comments: Very pleasant male appears stated age in no acute distress sitting comfortably in exam room  HENT:     Head: Normocephalic and atraumatic.     Right Ear: External ear normal.     Left Ear: External ear normal.     Nose: Nose normal.     Mouth/Throat:     Pharynx: Uvula midline. No oropharyngeal exudate or posterior oropharyngeal erythema.  Eyes:     General: Lids are normal.     Extraocular Movements: Extraocular movements intact.     Conjunctiva/sclera:     Right eye: Right conjunctiva is injected.     Left eye: Left conjunctiva is injected.     Pupils: Pupils  are equal, round, and reactive to light.     Funduscopic exam:    Right eye: No hemorrhage.        Left eye: No hemorrhage.     Comments: Purulent drainage noted bilaterally  Cardiovascular:     Rate and Rhythm: Normal rate and regular rhythm.     Heart sounds:  Normal heart sounds, S1 normal and S2 normal. No murmur heard. Pulmonary:     Effort: Pulmonary effort is normal. No accessory muscle usage or respiratory distress.     Breath sounds: Normal breath sounds. No stridor. No wheezing, rhonchi or rales.     Comments: Clear to auscultation bilaterally Abdominal:     General: Bowel sounds are normal.     Palpations: Abdomen is soft.     Tenderness: There is no abdominal tenderness.  Lymphadenopathy:     Head:     Right side of head: No submental, submandibular or tonsillar adenopathy.     Left side of head: No submental, submandibular or tonsillar adenopathy.     Cervical: No cervical adenopathy.  Neurological:     Mental Status: He is alert.  Psychiatric:        Behavior: Behavior is cooperative.     UC Treatments / Results  Labs (all labs ordered are listed, but only abnormal results are displayed) Labs Reviewed - No data to display  EKG   Radiology No results found.  Procedures Procedures (including critical care time)  Medications Ordered in UC Medications - No data to display  Initial Impression / Assessment and Plan / UC Course  I have reviewed the triage vital signs and the nursing notes.  Pertinent labs & imaging results that were available during my care of the patient were reviewed by me and considered in my medical decision making (see chart for details).      Patient treated for bacterial conjunctivitis given clinical presentation.  Fluorescein stain was not applied as patient has no ocular pain or foreign body sensation.  He was prescribed ciprofloxacin and instructed to use these drops multiple times per day as directed.  He can use artificial tears  for additional symptom relief but was discouraged from using vasoconstricting medication.  Recommend he follow-up with ophthalmologist if symptoms do not improve.  Discussed alarm symptoms that warrant emergent evaluation.  Strict return precautions given to which patient expressed understanding.  Final Clinical Impressions(s) / UC Diagnoses   Final diagnoses:  Bacterial conjunctivitis of both eyes     Discharge Instructions      Use eyedrops as prescribed.  Use artificial tears for additional symptom relief.  Use a warm compress to clean eyes.  Do not use any vasoconstricting medications such as Visine.  If symptoms not improving or if anything worsens including changes in vision, increased congestion, nausea, vomiting, headache you need to be reevaluated immediately.     ED Prescriptions     Medication Sig Dispense Auth. Provider   ciprofloxacin (CILOXAN) 0.3 % ophthalmic solution Place 1 drop into both eyes every 2 (two) hours. Administer 1 drop, every 2 hours, while awake, for 2 days. Then 1 drop, every 4 hours, while awake, for the next 5 days. 5 mL Evadean Sproule K, PA-C      PDMP not reviewed this encounter.   Jeani Hawking, PA-C 05/07/21 1844

## 2021-05-07 NOTE — Discharge Instructions (Addendum)
Use eyedrops as prescribed.  Use artificial tears for additional symptom relief.  Use a warm compress to clean eyes.  Do not use any vasoconstricting medications such as Visine.  If symptoms not improving or if anything worsens including changes in vision, increased congestion, nausea, vomiting, headache you need to be reevaluated immediately.

## 2021-05-18 ENCOUNTER — Other Ambulatory Visit: Payer: Self-pay

## 2021-05-18 ENCOUNTER — Ambulatory Visit: Payer: Medicare Other

## 2021-05-18 DIAGNOSIS — I4891 Unspecified atrial fibrillation: Secondary | ICD-10-CM | POA: Diagnosis not present

## 2021-05-18 DIAGNOSIS — Z7901 Long term (current) use of anticoagulants: Secondary | ICD-10-CM | POA: Diagnosis not present

## 2021-05-18 LAB — POCT INR: INR: 3.3 — AB (ref 2.0–3.0)

## 2021-05-18 NOTE — Patient Instructions (Signed)
Take 0.5 tablet Wednesday only and then Continue taking 1 tablet Daily except 0.5 tablet on Monday and Friday.  Recheck INR 4 weeks.   [phone]

## 2021-06-09 IMAGING — US US EXTREM LOW VENOUS*L*
1 series · 13 of 24 positions shown · non-contrast
Comparison: None.

CLINICAL DATA: Lower extremity pain.



[Series 1: us extrem low venous*left* · 13 of 34 slices shown]
[im 1/34]
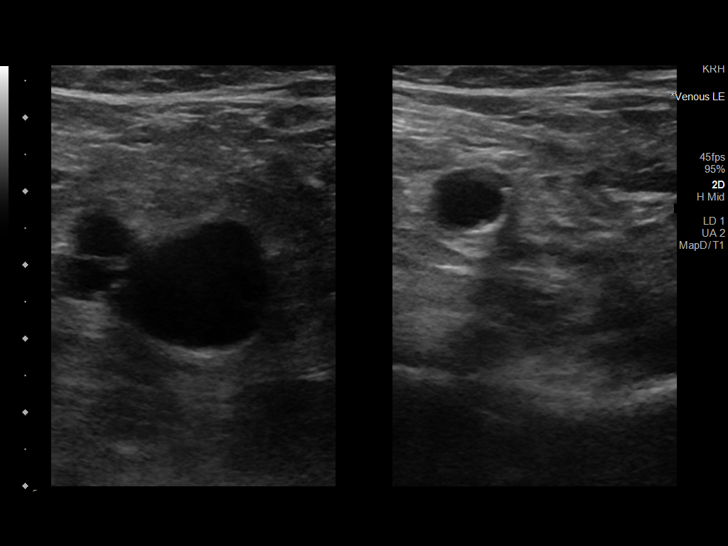
[im 3/34]
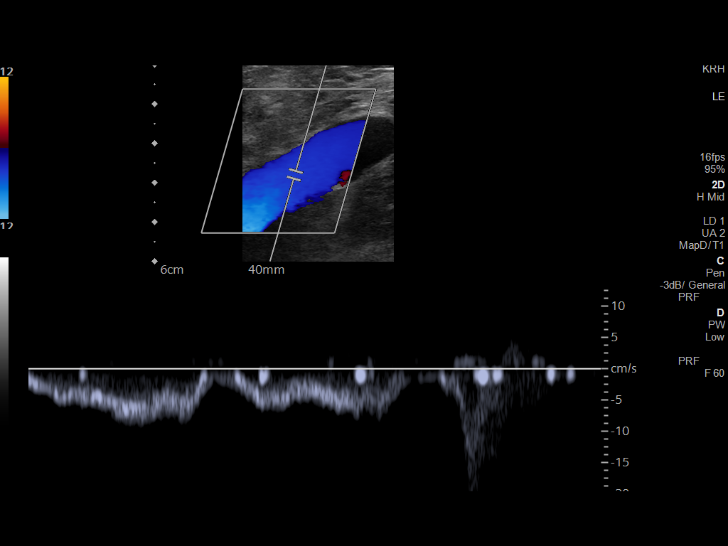
[im 6/34]
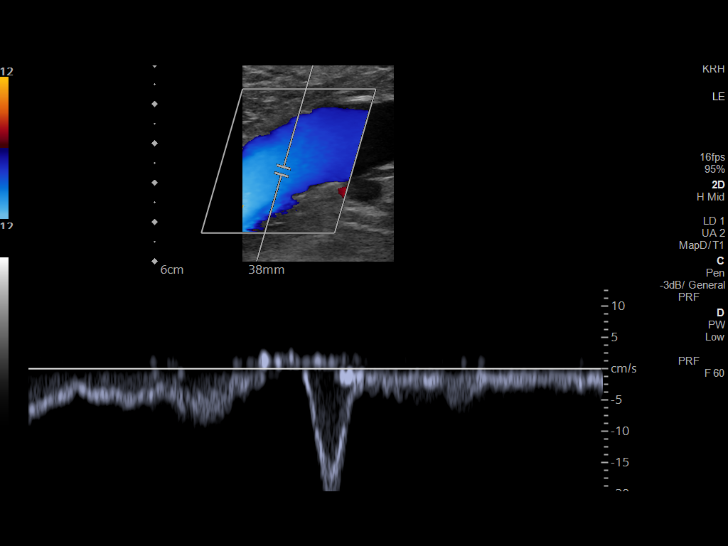
[im 9/34]
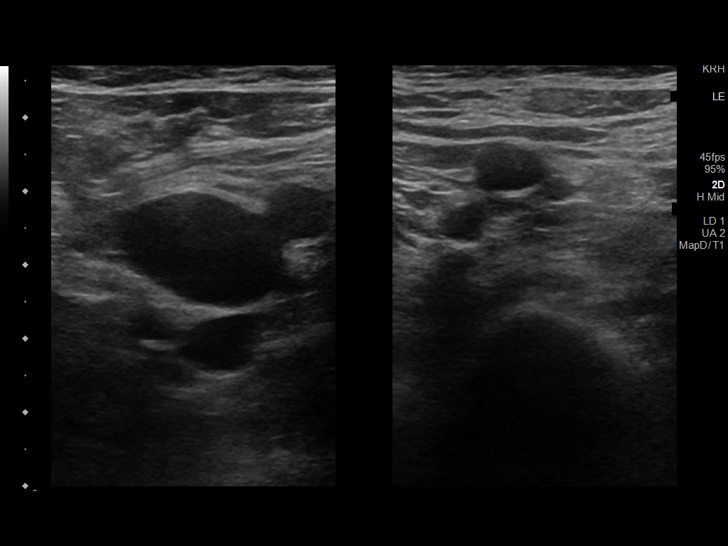
[im 12/34]
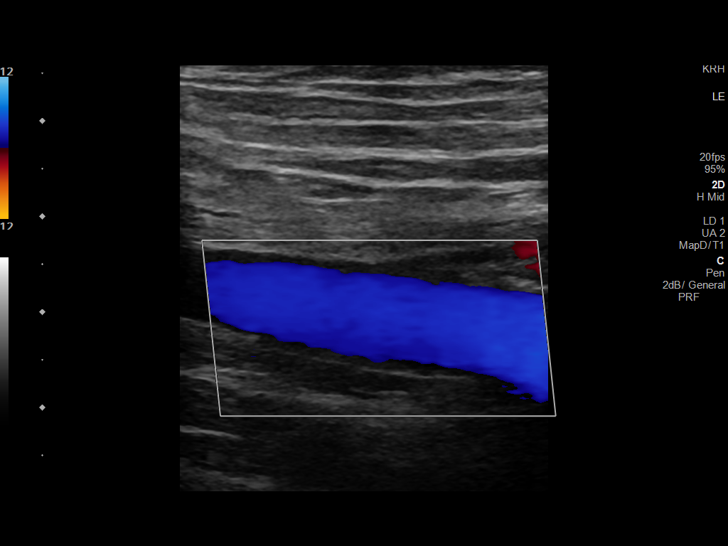
[im 15/34]
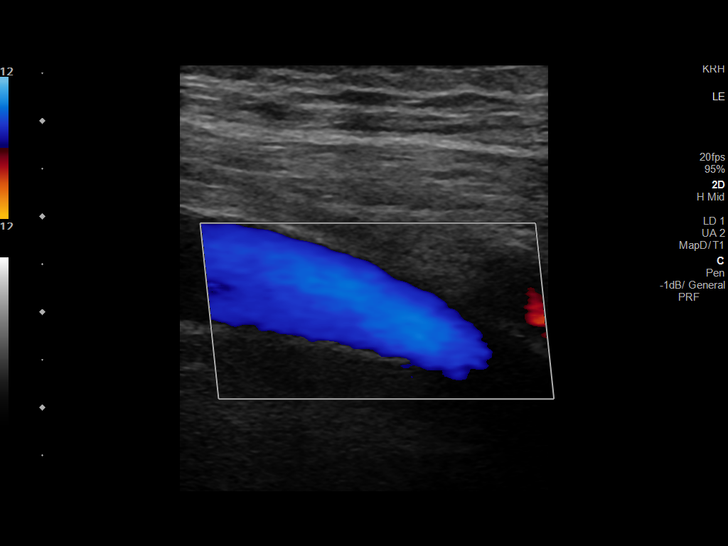
[im 18/34]
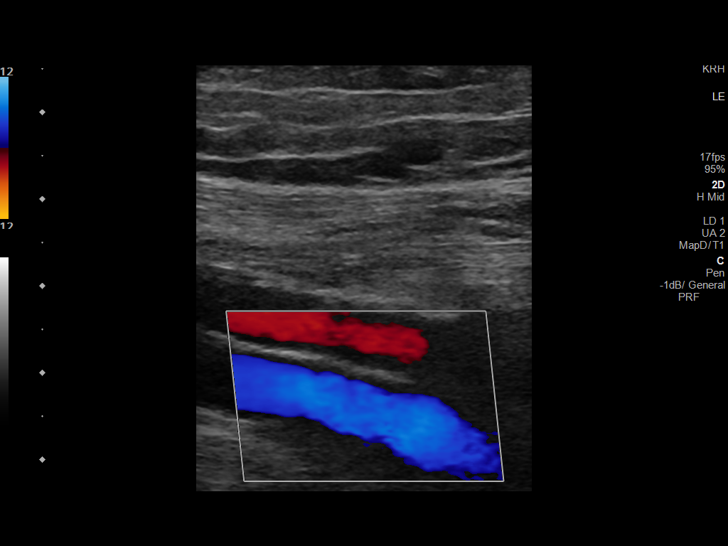
[im 19/34]
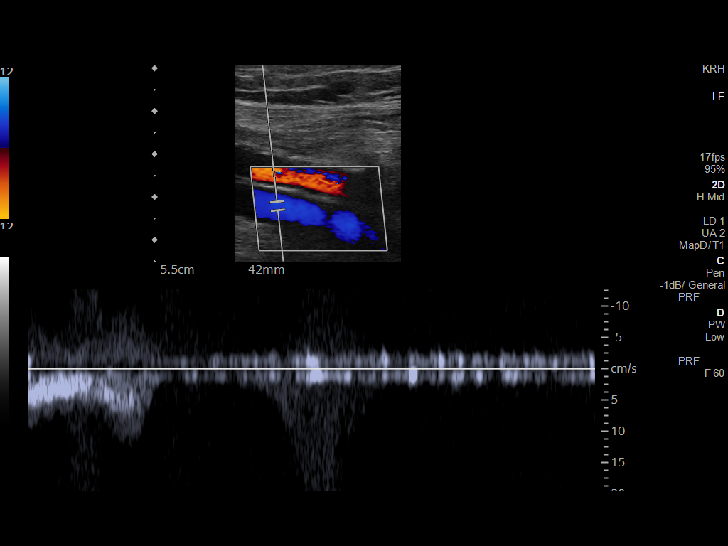
[im 22/34]
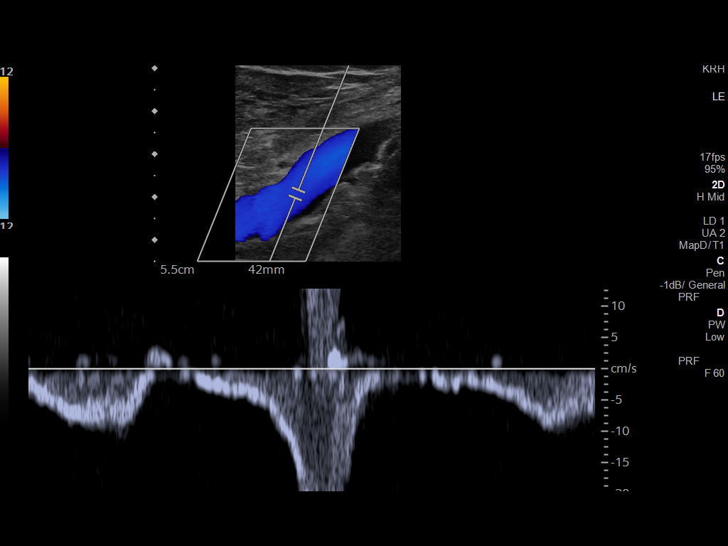
[im 25/34]
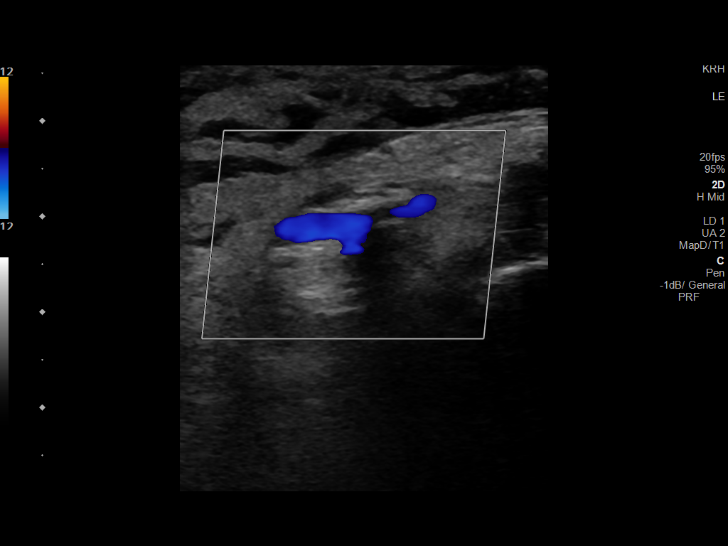
[im 28/34]
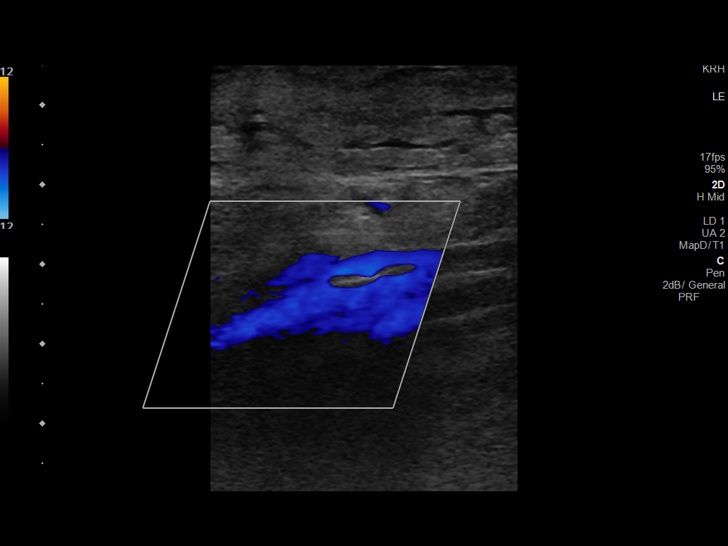
[im 31/34]
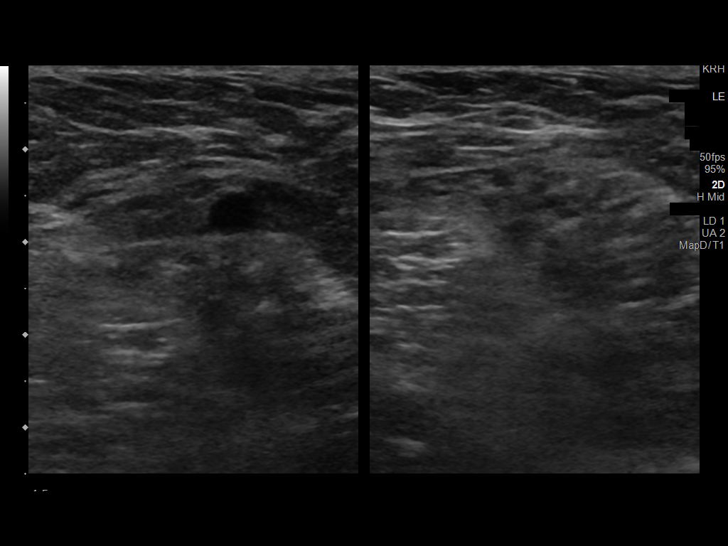
[im 34/34]
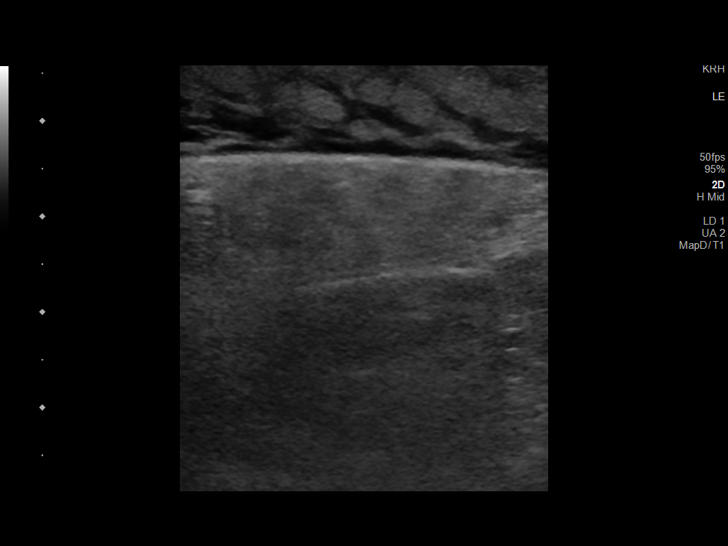

[13 of 24 positions shown; findings below may reference images not displayed]

FINDINGS: Contralateral Common Femoral Vein: Respiratory phasicity is normal
and symmetric with the symptomatic side. No evidence of thrombus.
Normal compressibility.

Common Femoral Vein: No evidence of thrombus. Normal
compressibility, respiratory phasicity and response to augmentation.

Saphenofemoral Junction: No evidence of thrombus. Normal
compressibility and flow on color Doppler imaging.

Profunda Femoral Vein: No evidence of thrombus. Normal
compressibility and flow on color Doppler imaging.

Femoral Vein: No evidence of thrombus. Normal compressibility,
respiratory phasicity and response to augmentation.

Popliteal Vein: No evidence of thrombus. Normal compressibility,
respiratory phasicity and response to augmentation.

Calf Veins: The calf veins were suboptimally evaluated.

Superficial Great Saphenous Vein: No evidence of thrombus. Normal
compressibility.

Venous Reflux:  Not evaluated on this exam.

Other Findings:  Lower extremity edema is noted.
IMPRESSION: 1. No DVT identified, however the calf veins were suboptimally
evaluated.
2. Nonspecific lower extremity edema is noted.

## 2021-06-15 ENCOUNTER — Ambulatory Visit: Payer: Medicare Other

## 2021-06-15 ENCOUNTER — Other Ambulatory Visit: Payer: Self-pay

## 2021-06-15 DIAGNOSIS — I4891 Unspecified atrial fibrillation: Secondary | ICD-10-CM

## 2021-06-15 DIAGNOSIS — Z7901 Long term (current) use of anticoagulants: Secondary | ICD-10-CM | POA: Diagnosis not present

## 2021-06-15 LAB — POCT INR: INR: 2.7 (ref 2.0–3.0)

## 2021-06-15 NOTE — Patient Instructions (Signed)
Continue taking 1 tablet Daily except 0.5 tablet on Monday and Friday.  Recheck INR 6 weeks.   [phone]

## 2021-06-16 NOTE — Progress Notes (Signed)
Cardiology Office Note:    Date:  06/17/2021   ID:  Warren Wagner., DOB 12/21/1934, MRN [id number]  PCP:  [name] Referring MD: [name]*    [facility]  Cardiologist:  [name], MD   Reason for visit: 1 year follow-up  History of Present Illness:    Willaim Wagner. is a 86 y.o. male with a hx of atrial fibrillation on warfarin adequate, lower extremity edema who is here for his annual follow-up.  He was last seen by Dr. [name] in August 2021 for lower extremity swelling.  He was noted to eat a lot of processed foods.  Dr. [name] discussed salt and fluid restriction.  He was asymptomatic from his A. fib.  Patient comes in with his daughter today.  Daughter stays with him 5 nights per week.  She helps with his groceries and housework.  They have no particular complaints today.  The patient started taking over arranging his medications for the past month.  He was not taking Lasix regularly previously but now taking it 3 times per week.  She states his leg swelling is similar to 6 months ago.  Denies weeping or blisters.  The patient is mostly sedentary.  He can walk with a cane.  He sleeps in the loveseat at a 45 degree angle.  He is not using compression stockings or wraps.  Weight stable from 1 year ago.  May have coughing if he reclines further.  No PND.  Has a scale but does not weigh self daily.  Otherwise, he is taking Coumadin with no bleeding.  His INR 2 days ago was therapeutic.  Denies lightheadedness and syncope.  No palpitations or chest pain.   Past Medical History:  Diagnosis Date   Atrial fibrillation (HCC)    Hearing loss    Hypertension    Prostate enlargement    Umbilical hernia 07/27/2012    Past Surgical History:  Procedure Laterality Date   CARDIOVERSION  08   HERNIA REPAIR  09/10/2012   large umbilical hernia   INSERTION OF MESH  09/10/2012   Procedure: INSERTION OF MESH;  Surgeon:  [name], MD;  Location: [facility] OR;  Service: General;  Laterality: N/A;   TONSILLECTOMY     "maybe" (09/10/2012)   UMBILICAL HERNIA REPAIR  09/10/2012   Procedure: HERNIA REPAIR UMBILICAL ADULT;  Surgeon: [name], MD;  Location: [facility] OR;  Service: General;  Laterality: N/A;    Current Medications: Current Meds  Medication Sig   cetirizine (ZYRTEC) 10 MG tablet Take 10 mg by mouth daily.   finasteride (PROSCAR) 5 MG tablet Take 5 mg by mouth daily.   fish oil-omega-3 fatty acids 1000 MG capsule Take 1 g by mouth daily.   metoprolol succinate (TOPROL-XL) 50 MG 24 hr tablet Take 50 mg by mouth daily. Take with or immediately following a meal.   Multiple Vitamins-Minerals (MULTIVITAMIN WITH MINERALS) tablet Take 1 tablet by mouth daily.   warfarin (COUMADIN) 5 MG tablet Take 1-2 tablets or as prescribed by [facility]   [DISCONTINUED] furosemide (LASIX) 40 MG tablet Take 1 tablet by mouth once daily     Allergies:   Patient has no known allergies.   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  Three children.    Social Determinants of Health   Financial Resource Strain: Not on file  Food Insecurity: Not on file  Transportation Needs: Not on file  Physical Activity: Not on file  Stress: Not on file  Social Connections: Not on file     Family History: The patient's family history is not on  file.  ROS:   Please see the history of present illness.     EKGs/Labs/Other Studies Reviewed:    EKG:  The ekg ordered today demonstrates atrial fibrillation with slow ventricular response, heart rate 55, QRS duration 96 ms, septal infarct.  Recent Labs: 12/14/2020: Creatinine, Ser 1.00 12/22/2020: ALT 29  Recent Lipid Panel No results found for: CHOL, TRIG, HDL, CHOLHDL, VLDL, LDLCALC, LDLDIRECT  Physical Exam:    VS:  BP 122/80 (BP Location: Left Arm, Patient Position: Sitting, Cuff Size: Large)   Pulse (!) 55   Ht 6' (1.829 m)   Wt 240 lb (108.9 kg)   SpO2 96%   BMI 32.55 kg/m     Wt Readings from Last 3 Encounters:  06/17/21 240 lb (108.9 kg)  01/11/21 242 lb (109.8 kg)  05/26/20 247 lb (112 kg)     GEN: Elderly, obese  in no acute distress HEENT: Normal NECK: JVD at low neck at 90 degrees; No carotid bruits CARDIAC: Irreg irreg RESPIRATORY:  Clear to auscultation without rales, wheezing or rhonchi  ABDOMEN: Soft, non-tender, non-distended MUSCULOSKELETAL: 2+  LLE, 1+ RLE edema to knees bilaterally; no weeping or blisters.  Positive ruddy appearance SKIN: Warm and dry NEUROLOGIC:  Alert and oriented PSYCHIATRIC:  Normal affect   ASSESSMENT AND PLAN   Chronic atrial fibrillation, asymptomatic -2D echo 2021 with normal LV function and no significant valvular abnormalities, moderate LVH moderate AS severe LAE -Continue Coumadin with goal INR 2-3; managed by the Coumadin clinic -Continue Toprol.  Heart rate controlled.  Aortic stenosis -Moderate AS on echo 2021, no aortic regurgitation visualized.  Mean gradient 18mmHg, peak 34mmHg, VTI 0.98cm2 -No angina or syncope.  Fluid status stable. -At his age would not do routine surveillance unless symptoms progress.  Lower extremity edema, stable -Left greater than right.  Stable per patient and daughter. -Worsened by sedentary lifestyle, obesity and processed food intake. -Recommend increasing Lasix to 40 mg daily  (currently taking 3 times per week.) -Check BMET today. -Given information about salty 6. -Recommend Ace wraps daily, can remove at night. -Elevate legs when able. -Continue monitoring for weeping and blistering.  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  Disposition - Follow-up in 1 year unless symptoms worsen.    Medication Adjustments/Labs and Tests Ordered: Current medicines are reviewed at length with the patient today.  Concerns regarding medicines are outlined above.  Orders Placed This Encounter  Procedures   Basic metabolic panel   EKG 12-Lead   Meds ordered this encounter  Medications   furosemide (LASIX) 40 MG tablet    Sig: Take 1 tablet (40 mg total) by mouth daily.    Dispense:  90 tablet    Refill:  3    Patient Instructions  Medication Instructions:  Start Lasix 40 mg (1 Tablet Daily). *If you need a refill on your cardiac medications before your next appointment, please call your pharmacy*   Lab Work: BMET  (Today) If you have labs (blood work) drawn today and your tests are completely normal, you will receive your results only by: MyChart Message (if you have MyChart) OR A paper copy in the mail If you have any lab test that is abnormal or we need to change your treatment, we will call you to review the results.   Testing/Procedures: No Testing   Follow-Up: At [facility], you and your health needs are our priority.  As part of our continuing mission to provide you with exceptional heart care, we have created designated Provider Care Teams.  These Care Teams include your primary Cardiologist (physician) and Advanced Practice Providers (APPs -  Physician Assistants and Nurse Practitioners) who all work together to provide you with the care you need, when you need it.  We recommend signing up for the patient portal called "MyChart".  Sign up information is provided on this After Visit Summary.  MyChart is used to  connect with patients for Virtual Visits (Telemedicine).  Patients are able to view lab/test results, encounter notes, upcoming appointments, etc.  Non-urgent messages can be sent to your provider as well.   To learn more about what you can do with MyChart, go to [email or web address].    Your next appointment:   1 year(s)  The format for your next appointment:   In Person  Provider:   [name], MD   Other Instructions Elevate and Wrap Legs Daily . Recommend walking 15 minutes Daily.    Heart Failure Action Plan A heart failure action plan helps you understand what to do when you have symptoms of heart failure. Your action plan is a color-coded plan that lists the symptoms to watch for and indicates what actions to take. If you have symptoms in the red zone, you need medical care right away. If you have symptoms in the yellow zone, you are having problems. If you have symptoms in the green zone, you are doing well. Follow the plan that was created by you and your health care provider. Review your plan each time you visit your health care provider. Red zone These signs and symptoms mean you should get medical help right away: You have trouble breathing when resting. You have a dry cough that is getting worse. You have swelling or pain in your legs or abdomen that is getting worse. You suddenly gain more than 2-3 lb (0.9-1.4 kg) in 24 hours, or more than 5 lb (2.3 kg) in a week. This amount may be more or less depending on your condition. You have trouble staying awake or you feel confused. You have chest pain. You do not have an appetite. You pass out. You have worsening sadness or depression. If you have any of these symptoms, call your local emergency services (911 in the [place] right away. Do not drive yourself to the hospital. Yellow zone These signs and symptoms mean your condition may be getting worse and you should make some changes: You have trouble breathing when  you are active, or you need to sleep with your head raised on  extra pillows to help you breathe. You have swelling in your legs or abdomen. You gain 2-3 lb (0.9-1.4 kg) in 24 hours, or 5 lb (2.3 kg) in a week. This amount may be more or less depending on your condition. You get tired easily. You have trouble sleeping. You have a dry cough. If you have any of these symptoms: Contact your health care provider within the next day. Your health care provider may adjust your medicines. Green zone These signs mean you are doing well and can continue what you are doing: You do not have shortness of breath. You have very little swelling or no new swelling. Your weight is stable (no gain or loss). You have a normal activity level. You do not have chest pain or any other new symptoms. Follow these instructions at home: Take over-the-counter and prescription medicines only as told by your health care provider. Weigh yourself daily. Your target weight is __________ lb (__________ kg). Call your health care provider if you gain more than __________ lb (__________ kg) in 24 hours, or more than __________ lb (__________ kg) in a week. Health care provider name: _____________________________________________________ Health care provider phone number: _____________________________________________________ Eat a heart-healthy diet. Work with a diet and nutrition specialist (dietitian) to create an eating plan that is best for you. Keep all follow-up visits. This is important. Where to find more information American Heart Association: www.heart.org Summary A heart failure action plan helps you understand what to do when you have symptoms of heart failure. Follow the action plan that was created by you and your health care provider. Get help right away if you have any symptoms in the red zone. This information is not intended to replace advice given to you by your health care provider. Make sure you discuss  any questions you have with your health care provider. Document Revised: 05/11/2020 Document Reviewed: 05/11/2020 Elsevier Patient Education  2022 Elsevier Inc.    Other Instructions   Signed, Bernette Mayers  06/17/2021 10:01 AM    [facility]

## 2021-06-17 ENCOUNTER — Other Ambulatory Visit: Payer: Self-pay

## 2021-06-17 ENCOUNTER — Ambulatory Visit: Payer: Medicare Other | Admitting: Physician Assistant

## 2021-06-17 ENCOUNTER — Encounter: Payer: Self-pay | Admitting: Physician Assistant

## 2021-06-17 VITALS — BP 122/80 | HR 55 | Ht 72.0 in | Wt 240.0 lb

## 2021-06-17 DIAGNOSIS — I4891 Unspecified atrial fibrillation: Secondary | ICD-10-CM

## 2021-06-17 DIAGNOSIS — M7989 Other specified soft tissue disorders: Secondary | ICD-10-CM

## 2021-06-17 DIAGNOSIS — I77819 Aortic ectasia, unspecified site: Secondary | ICD-10-CM

## 2021-06-17 DIAGNOSIS — I350 Nonrheumatic aortic (valve) stenosis: Secondary | ICD-10-CM

## 2021-06-17 DIAGNOSIS — I482 Chronic atrial fibrillation, unspecified: Secondary | ICD-10-CM

## 2021-06-17 DIAGNOSIS — Z7901 Long term (current) use of anticoagulants: Secondary | ICD-10-CM

## 2021-06-17 MED ORDER — FUROSEMIDE 40 MG PO TABS
40.0000 mg | ORAL_TABLET | Freq: Every day | ORAL | 3 refills | Status: DC
Start: 2021-06-17 — End: 2022-07-27

## 2021-06-17 NOTE — Patient Instructions (Signed)
Medication Instructions:  Start Lasix 40 mg (1 Tablet Daily). *If you need a refill on your cardiac medications before your next appointment, please call your pharmacy*   Lab Work: BMET  (Today) If you have labs (blood work) drawn today and your tests are completely normal, you will receive your results only by: MyChart Message (if you have MyChart) OR A paper copy in the mail If you have any lab test that is abnormal or we need to change your treatment, we will call you to review the results.   Testing/Procedures: No Testing   Follow-Up: At [facility], you and your health needs are our priority.  As part of our continuing mission to provide you with exceptional heart care, we have created designated Provider Care Teams.  These Care Teams include your primary Cardiologist (physician) and Advanced Practice Providers (APPs -  Physician Assistants and Nurse Practitioners) who all work together to provide you with the care you need, when you need it.  We recommend signing up for the patient portal called "MyChart".  Sign up information is provided on this After Visit Summary.  MyChart is used to connect with patients for Virtual Visits (Telemedicine).  Patients are able to view lab/test results, encounter notes, upcoming appointments, etc.  Non-urgent messages can be sent to your provider as well.   To learn more about what you can do with MyChart, go to [email or web address].    Your next appointment:   1 year(s)  The format for your next appointment:   In Person  Provider:   [name], MD   Other Instructions Elevate and Wrap Legs Daily . Recommend walking 15 minutes Daily.    Heart Failure Action Plan A heart failure action plan helps you understand what to do when you have symptoms of heart failure. Your action plan is a color-coded plan that lists the symptoms to watch for and indicates what actions to take. If you have symptoms in the red zone, you need medical  care right away. If you have symptoms in the yellow zone, you are having problems. If you have symptoms in the green zone, you are doing well. Follow the plan that was created by you and your health care provider. Review your plan each time you visit your health care provider. Red zone These signs and symptoms mean you should get medical help right away: You have trouble breathing when resting. You have a dry cough that is getting worse. You have swelling or pain in your legs or abdomen that is getting worse. You suddenly gain more than 2-3 lb (0.9-1.4 kg) in 24 hours, or more than 5 lb (2.3 kg) in a week. This amount may be more or less depending on your condition. You have trouble staying awake or you feel confused. You have chest pain. You do not have an appetite. You pass out. You have worsening sadness or depression. If you have any of these symptoms, call your local emergency services (911 in the [place] right away. Do not drive yourself to the hospital. Yellow zone These signs and symptoms mean your condition may be getting worse and you should make some changes: You have trouble breathing when you are active, or you need to sleep with your head raised on extra pillows to help you breathe. You have swelling in your legs or abdomen. You gain 2-3 lb (0.9-1.4 kg) in 24 hours, or 5 lb (2.3 kg) in a week. This amount may be more or less depending on your  condition. You get tired easily. You have trouble sleeping. You have a dry cough. If you have any of these symptoms: Contact your health care provider within the next day. Your health care provider may adjust your medicines. Green zone These signs mean you are doing well and can continue what you are doing: You do not have shortness of breath. You have very little swelling or no new swelling. Your weight is stable (no gain or loss). You have a normal activity level. You do not have chest pain or any other new symptoms. Follow these  instructions at home: Take over-the-counter and prescription medicines only as told by your health care provider. Weigh yourself daily. Your target weight is __________ lb (__________ kg). Call your health care provider if you gain more than __________ lb (__________ kg) in 24 hours, or more than __________ lb (__________ kg) in a week. Health care provider name: _____________________________________________________ Health care provider phone number: _____________________________________________________ Eat a heart-healthy diet. Work with a diet and nutrition specialist (dietitian) to create an eating plan that is best for you. Keep all follow-up visits. This is important. Where to find more information American Heart Association: www.heart.org Summary A heart failure action plan helps you understand what to do when you have symptoms of heart failure. Follow the action plan that was created by you and your health care provider. Get help right away if you have any symptoms in the red zone. This information is not intended to replace advice given to you by your health care provider. Make sure you discuss any questions you have with your health care provider. Document Revised: 05/11/2020 Document Reviewed: 05/11/2020 Elsevier Patient Education  2022 [employer].    Other Instructions

## 2021-06-18 ENCOUNTER — Telehealth: Payer: Self-pay

## 2021-06-18 LAB — BASIC METABOLIC PANEL
BUN/Creatinine Ratio: 14 (ref 10–24)
BUN: 16 mg/dL (ref 8–27)
CO2: 27 mmol/L (ref 20–29)
Calcium: 10.2 mg/dL (ref 8.6–10.2)
Chloride: 102 mmol/L (ref 96–106)
Creatinine, Ser: 1.14 mg/dL (ref 0.76–1.27)
Glucose: 84 mg/dL (ref 65–99)
Potassium: 4.9 mmol/L (ref 3.5–5.2)
Sodium: 144 mmol/L (ref 134–144)
eGFR: 63 mL/min/{1.73_m2} (ref >59)

## 2021-06-18 NOTE — Telephone Encounter (Addendum)
Spoke with patients Daughter regarding results.----- Message from Cannon Kettle, PA-C sent at 06/18/2021  9:15 AM EDT ----- Your kidney function and electrolytes are normal.  This is great news.  You are safe to take lasix every day to help your leg swelling.

## 2021-07-03 ENCOUNTER — Other Ambulatory Visit: Payer: Self-pay | Admitting: Cardiology

## 2021-07-06 ENCOUNTER — Other Ambulatory Visit: Payer: Self-pay

## 2021-07-06 ENCOUNTER — Ambulatory Visit (INDEPENDENT_AMBULATORY_CARE_PROVIDER_SITE_OTHER): Payer: Medicare Other | Admitting: Ophthalmology

## 2021-07-06 ENCOUNTER — Encounter (INDEPENDENT_AMBULATORY_CARE_PROVIDER_SITE_OTHER): Payer: Self-pay | Admitting: Ophthalmology

## 2021-07-06 DIAGNOSIS — H353113 Nonexudative age-related macular degeneration, right eye, advanced atrophic without subfoveal involvement: Secondary | ICD-10-CM | POA: Diagnosis not present

## 2021-07-06 DIAGNOSIS — H353124 Nonexudative age-related macular degeneration, left eye, advanced atrophic with subfoveal involvement: Secondary | ICD-10-CM

## 2021-07-06 DIAGNOSIS — H353222 Exudative age-related macular degeneration, left eye, with inactive choroidal neovascularization: Secondary | ICD-10-CM | POA: Diagnosis not present

## 2021-07-06 DIAGNOSIS — H353133 Nonexudative age-related macular degeneration, bilateral, advanced atrophic without subfoveal involvement: Secondary | ICD-10-CM | POA: Diagnosis not present

## 2021-07-06 DIAGNOSIS — H43813 Vitreous degeneration, bilateral: Secondary | ICD-10-CM | POA: Diagnosis not present

## 2021-07-06 NOTE — Assessment & Plan Note (Signed)
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

## 2021-07-06 NOTE — Assessment & Plan Note (Signed)
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 

## 2021-07-06 NOTE — Progress Notes (Signed)
07/06/2021     CHIEF COMPLAINT Patient presents for  Chief Complaint  Patient presents with   Retina Follow Up      HISTORY OF PRESENT ILLNESS: Warren Wagner. is a 86 y.o. male who presents to the clinic today for:   HPI     Retina Follow Up   Patient presents with  Wet AMD.  In left eye.  This started 6 months ago.  Severity is mild.  Duration of 6 months.  Since onset it is stable.        Comments   6 month fu OU and OCT Pt states VA OU stable since last visit. Pt denies FOL, floaters, or ocular pain OU.  Pt states, "It seems to me that my vision is doing pretty good."       Last edited by Demetrios Loll, COA on 07/06/2021 10:57 AM.      Referring physician: Jethro Bolus, MD [address] [place],  [place] [address]  HISTORICAL INFORMATION:   Selected notes from the MEDICAL RECORD NUMBER       CURRENT MEDICATIONS: Current Outpatient Medications (Ophthalmic Drugs)  Medication Sig   ciprofloxacin (CILOXAN) 0.3 % ophthalmic solution Place 1 drop into both eyes every 2 (two) hours. Administer 1 drop, every 2 hours, while awake, for 2 days. Then 1 drop, every 4 hours, while awake, for the next 5 days. (Patient not taking: Reported on 06/17/2021)   No current facility-administered medications for this visit. (Ophthalmic Drugs)   Current Outpatient Medications (Other)  Medication Sig   cetirizine (ZYRTEC) 10 MG tablet Take 10 mg by mouth daily.   finasteride (PROSCAR) 5 MG tablet Take 5 mg by mouth daily.   fish oil-omega-3 fatty acids 1000 MG capsule Take 1 g by mouth daily.   furosemide (LASIX) 40 MG tablet Take 1 tablet (40 mg total) by mouth daily.   metoprolol succinate (TOPROL-XL) 50 MG 24 hr tablet Take 50 mg by mouth daily. Take with or immediately following a meal.   Multiple Vitamins-Minerals (MULTIVITAMIN WITH MINERALS) tablet Take 1 tablet by mouth daily.   warfarin (COUMADIN) 5 MG tablet TAKE 1 TO 2 TABLETS BY MOUTH AS DIRECTED BY COUMADIN  CLINIC   No current facility-administered medications for this visit. (Other)      REVIEW OF SYSTEMS:    ALLERGIES No Known Allergies  PAST MEDICAL HISTORY Past Medical History:  Diagnosis Date   Atrial fibrillation (HCC)    Hearing loss    Hypertension    Prostate enlargement    Umbilical hernia 07/27/2012   Past Surgical History:  Procedure Laterality Date   CARDIOVERSION  08   HERNIA REPAIR  09/10/2012   large umbilical hernia   INSERTION OF MESH  09/10/2012   Procedure: INSERTION OF MESH;  Surgeon: [name], MD;  Location: [facility] OR;  Service: General;  Laterality: N/A;   TONSILLECTOMY     "maybe" (09/10/2012)   UMBILICAL HERNIA REPAIR  09/10/2012   Procedure: HERNIA REPAIR UMBILICAL ADULT;  Surgeon: [name], MD;  Location: [facility] OR;  Service: General;  Laterality: N/A;    FAMILY HISTORY History reviewed. No pertinent family history.  SOCIAL HISTORY Social History   Tobacco Use   Smoking status: Former    Packs/day: 1.50    Years: 10.00    Pack years: 15.00    Types: Cigarettes    Quit date: 07/27/1982    Years since quitting: 38.9   Smokeless tobacco: Never  Substance Use Topics   Alcohol use: No   Drug use: No         OPHTHALMIC EXAM:  Base Eye Exam     Visual Acuity (ETDRS)       Right Left   Dist cc 20/50 -1 CF @ 3'   Dist ph cc NI NI    Correction: Glasses         Tonometry (Tonopen, 11:01 AM)       Right Left   Pressure 15 15         Pupils       Pupils Dark Light Shape React APD   Right PERRL 5 4 Round Brisk None   Left PERRL 4 4 Round Minimal None         Visual Fields (Counting fingers)       Left Right    Full Full         Extraocular Movement       Right Left    Full Full         Neuro/Psych     Oriented x3: Yes   Mood/Affect: Normal         Dilation     Both eyes: 1.0% Mydriacyl, 2.5% Phenylephrine @ 11:01 AM           Slit Lamp and Fundus Exam     External Exam        Right Left   External Normal Normal         Slit Lamp Exam       Right Left   Lids/Lashes Normal Normal   Conjunctiva/Sclera White and quiet White and quiet   Cornea Clear Clear   Anterior Chamber Deep and quiet Deep and quiet   Iris Round and reactive Round and reactive   Lens Posterior chamber intraocular lens Posterior chamber intraocular lens   Anterior Vitreous Normal Normal         Fundus Exam       Right Left   Posterior Vitreous Posterior vitreous detachment Posterior vitreous detachment   Disc Normal Normal   C/D Ratio 0.25 0.3   Macula Soft drusen nearly confluent and subfoveal, Retinal pigment epithelial mottling Soft drusen, Retinal pigment epithelial mottling, Geographic atrophy subfoveal   Vessels Normal Normal   Periphery Normal Normal            IMAGING AND PROCEDURES  Imaging and Procedures for 07/06/21  OCT, Retina - OU - Both Eyes       Right Eye Quality was good. Scan locations included subfoveal. Central Foveal Thickness: 260. Progression has been stable. Findings include inner retinal atrophy, outer retinal atrophy, central retinal atrophy.   Left Eye Quality was good. Scan locations included subfoveal. Central Foveal Thickness: 181. Progression has been stable. Findings include abnormal foveal contour, retinal drusen , outer retinal atrophy, central retinal atrophy, inner retinal atrophy.   Notes No signs of active CNVM OU             ASSESSMENT/PLAN:  Posterior vitreous detachment of both eyes  The nature of posterior vitreous detachment was discussed with the patient as well as its physiology, its age prevalence, and its possible implication regarding retinal breaks and detachment.  An informational brochure was offered to the patient.  All the patient's questions were answered.  The patient was asked to return if new or different flashes or floaters develops.   Patient was instructed to contact office immediately if any new  changes were noticed.  I explained to the patient that vitreous inside the eye is similar to jello inside a bowl. As the jello melts it can start to pull away from the bowl, similarly the vitreous throughout our lives can begin to pull away from the retina. That process is called a posterior vitreous detachment. In some cases, the vitreous can tug hard enough on the retina to form a retinal tear. I discussed with the patient the signs and symptoms of a retinal detachment.  Do not rub the eye.    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 Posterior vitreous detachment of both eyes  H43.813 OCT, Retina - OU - Both Eyes    4. Advanced nonexudative age-related macular degeneration of right eye without subfoveal involvement  H35.3113     5. Advanced nonexudative age-related macular degeneration of left eye with subfoveal involvement  H35.3124       1.  OU with stable dry AMD.  Atrophic change in the fovea account for poor acuity yet no signs of CNVM developing or redeveloping in either eye  2.  Patient to report new onset visual acuity declines or distortions in either eye  3.  Ophthalmic Meds Ordered this visit:  No orders of the defined  types were placed in this encounter.      Return in about 6 months (around 01/03/2022) for DILATE OU, COLOR FP, OCT.  There are no Patient Instructions on file for this visit.   Explained the diagnoses, plan, and follow up with the patient and they expressed understanding.  Patient expressed understanding of the importance of proper follow up care.   Alford Highland Ahtziry Saathoff M.D. Diseases & Surgery of the Retina and Vitreous [facility] 07/06/21     Abbreviations: M myopia (nearsighted); A astigmatism; H hyperopia (farsighted); P presbyopia; Mrx spectacle prescription;  CTL contact lenses; OD right eye; OS left eye; OU both eyes  XT exotropia; ET esotropia; PEK punctate epithelial keratitis; PEE punctate epithelial erosions; DES dry eye syndrome; MGD meibomian gland dysfunction; ATs artificial tears; PFAT's preservative free artificial tears; NSC nuclear sclerotic cataract; PSC posterior subcapsular cataract; ERM epi-retinal membrane; PVD posterior vitreous detachment; RD retinal detachment; DM diabetes mellitus; DR diabetic retinopathy; NPDR non-proliferative diabetic retinopathy; PDR proliferative diabetic retinopathy; CSME clinically significant macular edema; DME diabetic macular edema; dbh dot blot hemorrhages; CWS cotton wool spot; POAG primary open angle glaucoma; C/D cup-to-disc ratio; HVF humphrey visual field; GVF goldmann visual field; OCT optical coherence tomography; IOP intraocular pressure; BRVO Branch retinal vein occlusion; CRVO central retinal vein occlusion; CRAO central retinal artery occlusion; BRAO branch retinal artery occlusion; RT retinal tear; SB scleral buckle; PPV pars plana vitrectomy; VH Vitreous hemorrhage; PRP panretinal laser photocoagulation; IVK intravitreal kenalog; VMT vitreomacular traction; MH Macular hole;  NVD neovascularization of the disc; NVE neovascularization elsewhere; AREDS  age related eye disease study; ARMD age related macular degeneration;  POAG primary open angle glaucoma; EBMD epithelial/anterior basement membrane dystrophy; ACIOL anterior chamber intraocular lens; IOL intraocular lens; PCIOL posterior chamber intraocular lens; Phaco/IOL phacoemulsification with intraocular lens placement; PRK photorefractive keratectomy; LASIK laser assisted in situ keratomileusis; HTN hypertension; DM diabetes mellitus; COPD chronic obstructive pulmonary disease

## 2021-07-06 NOTE — Assessment & Plan Note (Signed)

## 2021-07-06 NOTE — Assessment & Plan Note (Signed)
Perifoveal atrophy and confluent drusen subfoveal

## 2021-07-27 ENCOUNTER — Other Ambulatory Visit: Payer: Self-pay

## 2021-07-27 ENCOUNTER — Ambulatory Visit (INDEPENDENT_AMBULATORY_CARE_PROVIDER_SITE_OTHER): Payer: Medicare Other

## 2021-07-27 DIAGNOSIS — Z5181 Encounter for therapeutic drug level monitoring: Secondary | ICD-10-CM | POA: Diagnosis not present

## 2021-07-27 DIAGNOSIS — I4891 Unspecified atrial fibrillation: Secondary | ICD-10-CM

## 2021-07-27 LAB — POCT INR: INR: 2.7 (ref 2.0–3.0)

## 2021-07-27 NOTE — Patient Instructions (Signed)
Description   Continue taking 1 tablet Daily except 0.5 tablet on Monday and Friday.  Recheck INR 6 weeks.   [phone]

## 2021-09-07 ENCOUNTER — Ambulatory Visit (INDEPENDENT_AMBULATORY_CARE_PROVIDER_SITE_OTHER): Payer: Medicare Other

## 2021-09-07 ENCOUNTER — Other Ambulatory Visit: Payer: Self-pay

## 2021-09-07 DIAGNOSIS — I4891 Unspecified atrial fibrillation: Secondary | ICD-10-CM

## 2021-09-07 DIAGNOSIS — Z7901 Long term (current) use of anticoagulants: Secondary | ICD-10-CM

## 2021-09-07 DIAGNOSIS — Z5181 Encounter for therapeutic drug level monitoring: Secondary | ICD-10-CM | POA: Diagnosis not present

## 2021-09-07 LAB — POCT INR: INR: 3.4 — AB (ref 2.0–3.0)

## 2021-09-07 NOTE — Patient Instructions (Signed)
Description   Only take 0.5 tablet tomorrow and then continue taking 1 tablet Daily except 0.5 tablet on Monday and Friday.  Recheck INR 6 weeks.   Coumadin Clinic [phone]

## 2021-09-15 ENCOUNTER — Other Ambulatory Visit: Payer: Self-pay | Admitting: Cardiology

## 2021-10-19 ENCOUNTER — Ambulatory Visit (INDEPENDENT_AMBULATORY_CARE_PROVIDER_SITE_OTHER): Payer: Medicare Other

## 2021-10-19 ENCOUNTER — Other Ambulatory Visit: Payer: Self-pay

## 2021-10-19 DIAGNOSIS — Z7901 Long term (current) use of anticoagulants: Secondary | ICD-10-CM

## 2021-10-19 DIAGNOSIS — Z5181 Encounter for therapeutic drug level monitoring: Secondary | ICD-10-CM | POA: Diagnosis not present

## 2021-10-19 DIAGNOSIS — I4891 Unspecified atrial fibrillation: Secondary | ICD-10-CM | POA: Diagnosis not present

## 2021-10-19 LAB — POCT INR: INR: 5 — AB (ref 2.0–3.0)

## 2021-10-19 NOTE — Patient Instructions (Signed)
Description   Hold tomorrow's dose and Thursdays dose then continue taking 1 tablet Daily except 0.5 tablet on Monday and Friday.  Recheck INR 2 weeks.   Coumadin Clinic [phone]

## 2021-11-02 ENCOUNTER — Other Ambulatory Visit: Payer: Self-pay

## 2021-11-02 ENCOUNTER — Ambulatory Visit: Payer: Medicare Other

## 2021-11-02 DIAGNOSIS — Z5181 Encounter for therapeutic drug level monitoring: Secondary | ICD-10-CM | POA: Diagnosis not present

## 2021-11-02 DIAGNOSIS — Z7901 Long term (current) use of anticoagulants: Secondary | ICD-10-CM | POA: Diagnosis not present

## 2021-11-02 DIAGNOSIS — I4891 Unspecified atrial fibrillation: Secondary | ICD-10-CM | POA: Diagnosis not present

## 2021-11-02 LAB — POCT INR: INR: 2.6 (ref 2.0–3.0)

## 2021-11-02 NOTE — Patient Instructions (Signed)
Description   Continue taking 1 tablet Daily except 0.5 tablet on Monday and Friday.  Recheck INR 4 weeks.   Coumadin Clinic [phone]

## 2021-11-30 ENCOUNTER — Other Ambulatory Visit: Payer: Self-pay

## 2021-11-30 ENCOUNTER — Ambulatory Visit: Payer: Medicare Other

## 2021-11-30 DIAGNOSIS — Z5181 Encounter for therapeutic drug level monitoring: Secondary | ICD-10-CM | POA: Diagnosis not present

## 2021-11-30 DIAGNOSIS — I4891 Unspecified atrial fibrillation: Secondary | ICD-10-CM

## 2021-11-30 LAB — POCT INR: INR: 3.6 — AB (ref 2.0–3.0)

## 2021-11-30 MED ORDER — WARFARIN SODIUM 5 MG PO TABS: ORAL_TABLET | ORAL | 0 refills | Status: DC

## 2021-11-30 NOTE — Telephone Encounter (Signed)
Prescription refill request received for warfarin Lov: 06/17/21 Warren Wagner)  Next INR check: 12/28/21 Warfarin tablet strength: 5mg   Appropriate dose and refill sent to requested pharmacy.

## 2021-11-30 NOTE — Patient Instructions (Addendum)
Description   Hold today's dose and then continue taking 1 tablet Daily except 0.5 tablet on Monday and Friday.  Recheck INR 4 weeks.   Coumadin Clinic [phone]

## 2021-12-18 ENCOUNTER — Ambulatory Visit (INDEPENDENT_AMBULATORY_CARE_PROVIDER_SITE_OTHER): Payer: Medicare Other

## 2021-12-18 ENCOUNTER — Other Ambulatory Visit: Payer: Self-pay

## 2021-12-18 ENCOUNTER — Ambulatory Visit
Admission: RE | Admit: 2021-12-18 | Discharge: 2021-12-18 | Disposition: A | Discharge status: Home / Self Care | Payer: Medicare Other | Source: Ambulatory Visit | Attending: Internal Medicine | Admitting: Internal Medicine

## 2021-12-18 VITALS — BP 119/75 | HR 68 | Temp 97.8°F | Resp 18

## 2021-12-18 DIAGNOSIS — R053 Chronic cough: Secondary | ICD-10-CM | POA: Diagnosis not present

## 2021-12-18 DIAGNOSIS — R062 Wheezing: Secondary | ICD-10-CM | POA: Diagnosis not present

## 2021-12-18 DIAGNOSIS — R059 Cough, unspecified: Secondary | ICD-10-CM

## 2021-12-18 MED ORDER — PREDNISONE 20 MG PO TABS: 20.0000 mg | ORAL_TABLET | Freq: Every day | ORAL | 0 refills | Status: AC

## 2021-12-18 MED ORDER — BENZONATATE 100 MG PO CAPS: 100.0000 mg | ORAL_CAPSULE | Freq: Three times a day (TID) | ORAL | 0 refills | Status: DC | PRN

## 2021-12-18 NOTE — ED Provider Notes (Signed)
[facility]    CSN: [id number] Arrival date & time: 12/18/21  1248      History   Chief Complaint Chief Complaint  Patient presents with   Cough    HPI Warren Wagner. is a 87 y.o. male.   Patient presents with cough that has been present for approximately 1 week.  Caregiver who is present in room reports that he has a runny nose but he has a chronic runny nose.  Denies any fevers.  Patient denies shortness of breath.  Patient was taking Robitussin with minimal improvement.  Patient denies chest pain, shortness of breath, sore throat, ear pain, nausea, vomiting, diarrhea, abdominal pain.  Denies history of asthma or COPD.   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 03/02/2020   Posterior vitreous detachment of both eyes 03/02/2020   Atrial fibrillation (HCC) 07/27/2012    Past Surgical History:  Procedure Laterality Date   CARDIOVERSION  08   HERNIA REPAIR  09/10/2012   large umbilical hernia   INSERTION OF MESH  09/10/2012   Procedure: INSERTION OF MESH;  Surgeon: [name], MD;  Location: [facility] OR;  Service: General;  Laterality: N/A;   TONSILLECTOMY     "maybe" (09/10/2012)   UMBILICAL HERNIA REPAIR  09/10/2012   Procedure: HERNIA REPAIR UMBILICAL ADULT;  Surgeon: [name], MD;  Location: [facility] OR;  Service: General;  Laterality: N/A;       Home  Medications    Prior to Admission medications   Medication Sig Start Date End Date Taking? Authorizing Provider  benzonatate (TESSALON) 100 MG capsule Take 1 capsule (100 mg total) by mouth every 8 (eight) hours as needed for cough. 12/18/21  Yes , Acie Fredrickson, FNP  cetirizine (ZYRTEC) 10 MG tablet Take 10 mg by mouth daily.   Yes [provider]  ciprofloxacin (CILOXAN) 0.3 % ophthalmic solution Place 1 drop into both eyes every 2 (two) hours. Administer 1 drop, every 2 hours, while awake, for 2 days. Then 1 drop, every 4 hours, while awake, for the next 5 days. 05/07/21  Yes Raspet, Erin K, PA-C  finasteride (PROSCAR) 5 MG tablet Take 5 mg by mouth daily.   Yes [provider]  fish oil-omega-3 fatty acids 1000 MG capsule Take 1 g by mouth daily.   Yes [provider]  furosemide (LASIX) 40 MG tablet Take 1 tablet (40 mg total) by mouth daily. 06/17/21  Yes Juanda Crumble K, PA-C  metoprolol succinate (TOPROL-XL) 50 MG 24 hr tablet Take 50 mg by mouth daily. Take with or immediately following a meal.   Yes [provider]  Multiple Vitamins-Minerals (MULTIVITAMIN WITH MINERALS) tablet Take 1 tablet by mouth daily.   Yes [provider]  predniSONE (DELTASONE) 20 MG tablet Take 1 tablet (20 mg total) by mouth daily for 5 days. 12/18/21 12/23/21 Yes , Rolly Salter E, FNP  warfarin (COUMADIN) 5 MG tablet TAKE 1 TO  2 TABLETS BY MOUTH ONCE DAILY AS  DIRECTED 11/30/21  Yes Lanier Prude, MD    Family History History reviewed. No pertinent family history.  Social History Social History   Tobacco Use   Smoking status: Former    Packs/day: 1.50    Years: 10.00    Pack years: 15.00    Types: Cigarettes    Quit date: 07/27/1982    Years since quitting: 39.4   Smokeless tobacco: Never  Substance Use Topics   Alcohol use: No   Drug use: No     Allergies   Patient has no known allergies.   Review of Systems Review of Systems Per  HPI  Physical Exam Triage Vital Signs ED Triage Vitals  Enc Vitals Group     BP 12/18/21 1308 119/75     Pulse Rate 12/18/21 1308 68     Resp 12/18/21 1308 18     Temp 12/18/21 1308 97.8 F (36.6 C)     Temp Source 12/18/21 1308 Oral     SpO2 12/18/21 1308 92 %     Weight --      Height --      Head Circumference --      Peak Flow --      Pain Score 12/18/21 1310 0     Pain Loc --      Pain Edu? --      Excl. in GC? --    No data found.  Updated Vital Signs BP 119/75 (BP Location: Left Arm)    Pulse 68    Temp 97.8 F (36.6 C) (Oral)    Resp 18    SpO2 97%   Visual Acuity Right Eye Distance:   Left Eye Distance:   Bilateral Distance:    Right Eye Near:   Left Eye Near:    Bilateral Near:     Physical Exam Constitutional:      General: He is not in acute distress.    Appearance: Normal appearance. He is not toxic-appearing or diaphoretic.  HENT:     Head: Normocephalic and atraumatic.     Right Ear: Tympanic membrane and ear canal normal.     Left Ear: Tympanic membrane and ear canal normal.     Nose: Congestion present.     Mouth/Throat:     Mouth: Mucous membranes are moist.     Pharynx: No posterior oropharyngeal erythema.  Eyes:     Extraocular Movements: Extraocular movements intact.     Conjunctiva/sclera: Conjunctivae normal.     Pupils: Pupils are equal, round, and reactive to light.  Cardiovascular:     Rate and Rhythm: Normal rate and regular rhythm.     Pulses: Normal pulses.     Heart sounds: Normal heart sounds.  Pulmonary:     Effort: Pulmonary effort is normal. No respiratory distress.     Breath sounds: No stridor. Wheezing present. No rhonchi or rales.  Abdominal:     General: Abdomen is flat. Bowel sounds are normal.     Palpations: Abdomen is soft.  Musculoskeletal:        General: Normal range of motion.     Cervical back: Normal range of motion.  Skin:    General: Skin is warm and dry.  Neurological:     General: No focal  deficit present.     Mental Status: He is alert and oriented to person, place, and time. Mental status is at baseline.  Psychiatric:  Mood and Affect: Mood normal.        Behavior: Behavior normal.     UC Treatments / Results  Labs (all labs ordered are listed, but only abnormal results are displayed) Labs Reviewed - No data to display  EKG   Radiology DG Chest 2 View  Result Date: 12/18/2021 CLINICAL DATA:  Cough EXAM: CHEST - 2 VIEW COMPARISON:  11/02/2019, 12/14/2020 FINDINGS: Stable cardiomediastinal contours. Streaky left basilar opacities, favor scarring or atelectasis. Right lung is clear. No pleural effusion or pneumothorax. Degenerative changes of the thoracic spine. IMPRESSION: Streaky left basilar opacities, favor scarring or atelectasis. Electronically Signed   By: Duanne GuessNicholas  Plundo D.O.   On: 12/18/2021 13:45    Procedures Procedures (including critical care time)  Medications Ordered in UC Medications - No data to display  Initial Impression / Assessment and Plan / UC Course  I have reviewed the triage vital signs and the nursing notes.  Pertinent labs & imaging results that were available during my care of the patient were reviewed by me and considered in my medical decision making (see chart for details).     Chest x-ray was negative for any pneumonia.  Suspect viral process to patient's symptoms which could be a viral bronchitis.  Limited options on management for patient's wheezing on exam and symptoms given that patient takes warfarin and metoprolol.  Discussed prescribing albuterol inhaler as well as prednisone with patient and caregiver and the risk associated with abnormal INR and decreasing the effects of beta-blocker.  Patient and caregiver voiced understanding and wished to proceed with low-dose and short course of prednisone.  I do think this is reasonable as benefits outweigh risks given wheezing on physical exam.  Patient and caregiver were advised  to follow-up very closely with PCP and/or cardiologist to have INR checked as prednisone was started today. Discussed Strict return and ER precautions.  Patient and caregiver verbalized understanding and were agreeable with plan. Final Clinical Impressions(s) / UC Diagnoses   Final diagnoses:  Persistent cough  Wheezing     Discharge Instructions      You have been prescribed 2 medications to help alleviate symptoms.  Please follow-up with doctor In the next few days to have an INR check.    ED Prescriptions     Medication Sig Dispense Auth. Provider   benzonatate (TESSALON) 100 MG capsule Take 1 capsule (100 mg total) by mouth every 8 (eight) hours as needed for cough. 21 capsule [place], PrattHaley E, [place]   predniSONE (DELTASONE) 20 MG tablet Take 1 tablet (20 mg total) by mouth daily for 5 days. 5 tablet [place], Acie FredricksonHaley E, [place]NP      PDMP not reviewed this encounter.   Gustavus [place]ound, Eliyah Mcshea E, [place] 12/18/21 1423

## 2021-12-18 NOTE — Discharge Instructions (Signed)
You have been prescribed 2 medications to help alleviate symptoms.  Please follow-up with doctor In the next few days to have an INR check. ?

## 2021-12-18 NOTE — ED Triage Notes (Signed)
Patient c/o non-productive cough x 1 week, no other sx's.  Patient has been taken OTC Robitussin for cough. ?

## 2021-12-21 ENCOUNTER — Other Ambulatory Visit: Payer: Self-pay

## 2021-12-21 ENCOUNTER — Ambulatory Visit (INDEPENDENT_AMBULATORY_CARE_PROVIDER_SITE_OTHER): Payer: Medicare Other

## 2021-12-21 DIAGNOSIS — Z7901 Long term (current) use of anticoagulants: Secondary | ICD-10-CM

## 2021-12-21 DIAGNOSIS — Z5181 Encounter for therapeutic drug level monitoring: Secondary | ICD-10-CM | POA: Diagnosis not present

## 2021-12-21 DIAGNOSIS — I4891 Unspecified atrial fibrillation: Secondary | ICD-10-CM | POA: Diagnosis not present

## 2021-12-21 LAB — POCT INR: INR: 3.2 — AB (ref 2.0–3.0)

## 2021-12-21 NOTE — Patient Instructions (Addendum)
Description   ?Hold today's dose and then continue taking 1 tablet Daily except 0.5 tablet on Monday, Wednesday and Friday.  Recheck INR 2 weeks.  ? ?Coumadin Clinic [phone] ?  ?   ?

## 2021-12-28 NOTE — Progress Notes (Signed)
? ?New Patient Office Visit ? ?Subjective:  ?Patient ID: Warren Leas., male    DOB: [birth year]  Age: 87 y.o. MRN: [id number] ? ?CC:  ?Chief Complaint  ?Patient presents with  ? New Patient (Initial Visit)  ? ? ?HPI ?Warren Leas. presents to establish care. Patient is accompanied by his daughter. Patient has past medical hx of atrial fibrillation, hypertension and BPH. Patient is on anticoagulant therapy with warfarin and followed by the a-fib clinic. Takes Lasix 40 mg for lymphedema. Takes metoprolol 50 mg for elevated blood pressure. Takes finasteride 5 mg for BPH. Takes Zyrtec for allergies. Patient reports has trouble with falling and staying asleep. Has tried melatonin with minimal success. Denies caffeine use. Does report several naps during the day.  ? ?Past Medical History:  ?Diagnosis Date  ? Atrial fibrillation ([facility])   ? Hearing loss   ? Hypertension   ? Prostate enlargement   ? Umbilical hernia [id number]  ? ? ?Past Surgical History:  ?Procedure Laterality Date  ? CARDIOVERSION  08  ? HERNIA REPAIR  0000000  ? large umbilical hernia  ? INSERTION OF MESH  09/10/2012  ? Procedure: INSERTION OF MESH;  Surgeon: Zenovia Jarred, MD;  Location: [facility];  Service: General;  Laterality: N/A;  ? TONSILLECTOMY    ? "maybe" (09/10/2012)  ? UMBILICAL HERNIA REPAIR  09/10/2012  ? Procedure: HERNIA REPAIR UMBILICAL ADULT;  Surgeon: Zenovia Jarred, MD;  Location: [facility];  Service: General;  Laterality: N/A;  ? ? ?Family History  ?Problem Relation Age of Onset  ? Heart disease Mother   ? Stroke Father   ? ? ?Social History  ? ?Socioeconomic History  ? Marital status: Widowed  ?  Spouse name: Not on file  ? Number of children: Not on file  ? Years of education: Not on file  ? Highest education level: Not on file  ?Occupational History  ? Not on file  ?Tobacco Use  ? Smoking status: Former  ?  Packs/day: 1.50  ?  Years: 10.00  ?  Pack years: 15.00  ?  Types: Cigarettes  ?  Quit date: 07/27/1982  ?  Years  since quitting: 39.4  ? Smokeless tobacco: Never  ?Substance and Sexual Activity  ? Alcohol use: No  ? Drug use: No  ? Sexual activity: Not Currently  ?  Birth control/protection: None  ?Other Topics Concern  ? Not on file  ?Social History Narrative  ? 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  Three children.   ? ?Social Determinants of Health  ? ?Financial Resource Strain: Not on file  ?Food Insecurity: Not on file  ?Transportation Needs: Not on file  ?Physical Activity: Not on file  ?Stress: Not on file  ?Social Connections: Not on file  ?Intimate Partner Violence: Not on file  ? ? ?ROS ?Review of Systems ?Review of Systems:  ?A fourteen system review of systems was performed and found to be positive as per HPI. ? ?Objective:  ? ?Today's Vitals: BP 122/77   Pulse 86   Temp 97.7 ?F (36.5 ?C)   Ht 6' (1.829 m)   Wt 220 lb (99.8 kg)   SpO2 98%   BMI 29.84 kg/m?  ? ?Physical Exam ?General:  Pleasant and cooperative, appropriate for stated age.  ?Neuro:  Alert and oriented,  extra-ocular muscles intact  ?HEENT:  Normocephalic, atraumatic, neck supple  ?Skin:  no gross rash, warm, pink. ?Cardiac:  IRR, S1 S2 ?Respiratory: CTA B/L  ?  Vascular:  Ext warm, no cyanosis apprec.; cap RF less 2 sec. ?Psych:  No HI/SI, judgement and insight good, Euthymic mood. Full Affect. ? ?Assessment & Plan:  ? ?Problem List Items Addressed This Visit   ? ?  ? Cardiovascular and Mediastinum  ? Atrial fibrillation ([place])  ?  -Followed by cardiology/a-fib clinic. ?-On warfarin and beta blocker (metoprolol succinate 50 mg). ?  ?  ? Relevant Medications  ? metoprolol succinate (TOPROL-XL) 50 MG 24 hr tablet  ? Essential hypertension - Primary  ?  -BP in office stable. Continue metoprolol succinate 50 mg. Provided refill. Will continue to monitor. ?  ?  ? Relevant Medications  ? metoprolol succinate (TOPROL-XL) 50 MG 24 hr tablet  ?  ? Genitourinary  ? Benign prostatic hyperplasia  ?  -Stable. ?-Will continue finasteride 5 mg. ?-Will continue to  monitor. ?  ?  ?  ? Other  ? Lymphedema  ?  -Followed by Vascular. ?-Recommend to continue Lasix 40 mg. ?-Will collect CMP for medication monitoring with lab visit. ? ?  ?  ? Insomnia  ?  -Discussed with patient good sleep hygiene including avoiding daytime naps. Will trial low dose of trazodone. Advised to let me know if unable to tolerate medication. Will continue to monitor. ?  ?  ? Relevant Medications  ? traZODone (DESYREL) 50 MG tablet  ? Environmental and seasonal allergies  ?  -Stable. ?-Continue Zyrtec 10 mg.  ?-Will continue to monitor. ?  ?  ? ?Other Visit Diagnoses   ? ? Encounter to establish care      ? ?  ? ? ? ?Outpatient Encounter Medications as of 12/29/2021  ?Medication Sig  ? cetirizine (ZYRTEC) 10 MG tablet Take 10 mg by mouth daily.  ? finasteride (PROSCAR) 5 MG tablet Take 5 mg by mouth daily.  ? fish oil-omega-3 fatty acids 1000 MG capsule Take 1 g by mouth daily.  ? furosemide (LASIX) 40 MG tablet Take 1 tablet (40 mg total) by mouth daily.  ? Multiple Vitamins-Minerals (MULTIVITAMIN WITH MINERALS) tablet Take 1 tablet by mouth daily.  ? traZODone (DESYREL) 50 MG tablet Take 0.5-1 tablets (25-50 mg total) by mouth at bedtime as needed for sleep.  ? warfarin (COUMADIN) 5 MG tablet TAKE 1 TO 2 TABLETS BY MOUTH ONCE DAILY AS  DIRECTED  ? [DISCONTINUED] metoprolol succinate (TOPROL-XL) 50 MG 24 hr tablet Take 50 mg by mouth daily. Take with or immediately following a meal.  ? metoprolol succinate (TOPROL-XL) 50 MG 24 hr tablet Take 1 tablet (50 mg total) by mouth daily. Take with or immediately following a meal.  ? [DISCONTINUED] benzonatate (TESSALON) 100 MG capsule Take 1 capsule (100 mg total) by mouth every 8 (eight) hours as needed for cough.  ? [DISCONTINUED] ciprofloxacin (CILOXAN) 0.3 % ophthalmic solution Place 1 drop into both eyes every 2 (two) hours. Administer 1 drop, every 2 hours, while awake, for 2 days. Then 1 drop, every 4 hours, while awake, for the next 5 days.  ? ?No  facility-administered encounter medications on file as of 12/29/2021.  ? ? ?Follow-up: Return in about 3 months (around 03/31/2022) for HTN, edema,insomnia; lab visit in 1-2 weeks for FBW.  ? ?Lorrene Reid, PA-C ? ?

## 2021-12-29 ENCOUNTER — Encounter: Payer: Self-pay | Admitting: Physician Assistant

## 2021-12-29 ENCOUNTER — Other Ambulatory Visit: Payer: Self-pay

## 2021-12-29 ENCOUNTER — Ambulatory Visit (INDEPENDENT_AMBULATORY_CARE_PROVIDER_SITE_OTHER): Payer: Medicare Other | Admitting: Physician Assistant

## 2021-12-29 VITALS — BP 122/77 | HR 86 | Temp 97.7°F | Ht 72.0 in | Wt 220.0 lb

## 2021-12-29 DIAGNOSIS — N400 Enlarged prostate without lower urinary tract symptoms: Secondary | ICD-10-CM

## 2021-12-29 DIAGNOSIS — Z7689 Persons encountering health services in other specified circumstances: Secondary | ICD-10-CM | POA: Diagnosis not present

## 2021-12-29 DIAGNOSIS — I712 Thoracic aortic aneurysm, without rupture, unspecified: Secondary | ICD-10-CM | POA: Insufficient documentation

## 2021-12-29 DIAGNOSIS — I890 Lymphedema, not elsewhere classified: Secondary | ICD-10-CM | POA: Diagnosis not present

## 2021-12-29 DIAGNOSIS — I10 Essential (primary) hypertension: Secondary | ICD-10-CM

## 2021-12-29 DIAGNOSIS — I4891 Unspecified atrial fibrillation: Secondary | ICD-10-CM

## 2021-12-29 DIAGNOSIS — J3089 Other allergic rhinitis: Secondary | ICD-10-CM | POA: Insufficient documentation

## 2021-12-29 DIAGNOSIS — G4700 Insomnia, unspecified: Secondary | ICD-10-CM | POA: Insufficient documentation

## 2021-12-29 MED ORDER — METOPROLOL SUCCINATE ER 50 MG PO TB24: 50.0000 mg | ORAL_TABLET | Freq: Every day | ORAL | 1 refills | Status: DC

## 2021-12-29 MED ORDER — TRAZODONE HCL 50 MG PO TABS
25.0000 mg | ORAL_TABLET | Freq: Every evening | ORAL | 1 refills | Status: DC | PRN
Start: 2021-12-29 — End: 2022-03-04

## 2021-12-29 NOTE — Assessment & Plan Note (Signed)
-  Followed by Vascular. ?-Recommend to continue Lasix 40 mg. ?-Will collect CMP for medication monitoring with lab visit. ? ?

## 2021-12-29 NOTE — Assessment & Plan Note (Signed)
-  Stable. ?-Will continue finasteride 5 mg. ?-Will continue to monitor. ?

## 2021-12-29 NOTE — Assessment & Plan Note (Signed)
-  BP in office stable. Continue metoprolol succinate 50 mg. Provided refill. Will continue to monitor. ?

## 2021-12-29 NOTE — Assessment & Plan Note (Signed)
-  Followed by Retina specialist. ?

## 2021-12-29 NOTE — Assessment & Plan Note (Signed)
-  Stable. ?-Continue Zyrtec 10 mg.  ?-Will continue to monitor. ?

## 2021-12-29 NOTE — Patient Instructions (Signed)

## 2021-12-29 NOTE — Assessment & Plan Note (Signed)
-  Followed by cardiology/a-fib clinic. ?-On warfarin and beta blocker (metoprolol succinate 50 mg). ?

## 2021-12-29 NOTE — Assessment & Plan Note (Signed)
-  Discussed with patient good sleep hygiene including avoiding daytime naps. Will trial low dose of trazodone. Advised to let me know if unable to tolerate medication. Will continue to monitor. ?

## 2022-01-03 ENCOUNTER — Encounter (INDEPENDENT_AMBULATORY_CARE_PROVIDER_SITE_OTHER): Payer: Self-pay | Admitting: Ophthalmology

## 2022-01-03 ENCOUNTER — Ambulatory Visit (INDEPENDENT_AMBULATORY_CARE_PROVIDER_SITE_OTHER): Payer: Medicare Other | Admitting: Ophthalmology

## 2022-01-03 ENCOUNTER — Encounter (INDEPENDENT_AMBULATORY_CARE_PROVIDER_SITE_OTHER): Payer: Medicare Other | Admitting: Ophthalmology

## 2022-01-03 ENCOUNTER — Other Ambulatory Visit: Payer: Self-pay

## 2022-01-03 DIAGNOSIS — H353113 Nonexudative age-related macular degeneration, right eye, advanced atrophic without subfoveal involvement: Secondary | ICD-10-CM

## 2022-01-03 DIAGNOSIS — H353222 Exudative age-related macular degeneration, left eye, with inactive choroidal neovascularization: Secondary | ICD-10-CM

## 2022-01-03 DIAGNOSIS — H353124 Nonexudative age-related macular degeneration, left eye, advanced atrophic with subfoveal involvement: Secondary | ICD-10-CM

## 2022-01-03 NOTE — Assessment & Plan Note (Signed)
Subfoveal geographic AMD accounts for acuity ?

## 2022-01-03 NOTE — Assessment & Plan Note (Signed)
Perifoveal dry AMD present, may be  candidate for injections in the future when medication approved ?

## 2022-01-03 NOTE — Progress Notes (Signed)
? ? ?01/03/2022 ? ?  ? ?CHIEF COMPLAINT ?Patient presents for  ?Chief Complaint  ?Patient presents with  ? Macular Degeneration  ? ? ? ? ?HISTORY OF PRESENT ILLNESS: ?Warren KnockSamuel W Tomkins Jr. is a 87 y.o. male who presents to the clinic today for:  ? ?HPI   ?6 mos fu OU OCT FP. ?Patient states vision is stable and unchanged since last visit. Denies any new floaters or FOL. ? ?Last edited by Nelva NayKronstein, Anna N on 01/03/2022 10:18 AM.  ?  ? ? ?Referring physician: ?Pa, [facility] ?[address] ?[place],  [place] [phone] ? ?HISTORICAL INFORMATION:  ? ?Selected notes from the MEDICAL RECORD NUMBER ?  ?   ? ?CURRENT MEDICATIONS: ?No current outpatient medications on file. (Ophthalmic Drugs)  ? ?No current facility-administered medications for this visit. (Ophthalmic Drugs)  ? ?Current Outpatient Medications (Other)  ?Medication Sig  ? cetirizine (ZYRTEC) 10 MG tablet Take 10 mg by mouth daily.  ? finasteride (PROSCAR) 5 MG tablet Take 5 mg by mouth daily.  ? fish oil-omega-3 fatty acids 1000 MG capsule Take 1 g by mouth daily.  ? furosemide (LASIX) 40 MG tablet Take 1 tablet (40 mg total) by mouth daily.  ? metoprolol succinate (TOPROL-XL) 50 MG 24 hr tablet Take 1 tablet (50 mg total) by mouth daily. Take with or immediately following a meal.  ? Multiple Vitamins-Minerals (MULTIVITAMIN WITH MINERALS) tablet Take 1 tablet by mouth daily.  ? traZODone (DESYREL) 50 MG tablet Take 0.5-1 tablets (25-50 mg total) by mouth at bedtime as needed for sleep.  ? warfarin (COUMADIN) 5 MG tablet TAKE 1 TO 2 TABLETS BY MOUTH ONCE DAILY AS  DIRECTED  ? ?No current facility-administered medications for this visit. (Other)  ? ? ? ? ?REVIEW OF SYSTEMS: ?ROS   ?Negative for: Constitutional, Gastrointestinal, Neurological, Skin, Genitourinary, Musculoskeletal, HENT, Endocrine, Cardiovascular, Eyes, Respiratory, Psychiatric, Allergic/Imm, Heme/Lymph ?Last edited by Edmon Crapeankin, Sigismund Cross A, MD on 01/03/2022 11:03 AM.  ?  ? ? ? ?ALLERGIES ?No  Known Allergies ? ?PAST MEDICAL HISTORY ?Past Medical History:  ?Diagnosis Date  ? Atrial fibrillation ([facility])   ? Hearing loss   ? Hypertension   ? Prostate enlargement   ? Umbilical hernia 07/27/2012  ? ?Past Surgical History:  ?Procedure Laterality Date  ? CARDIOVERSION  08  ? HERNIA REPAIR  09/10/2012  ? large umbilical hernia  ? INSERTION OF MESH  09/10/2012  ? Procedure: INSERTION OF MESH;  Surgeon: Liz MaladyBurke E Thompson, MD;  [facility];  Service: General;  Laterality: N/A;  ? TONSILLECTOMY    ? "maybe" (09/10/2012)  ? UMBILICAL HERNIA REPAIR  09/10/2012  ? Procedure: HERNIA REPAIR UMBILICAL ADULT;  Surgeon: Liz MaladyBurke E Thompson, MD;  [facility];  Service: General;  Laterality: N/A;  ? ? ?FAMILY HISTORY ?Family History  ?Problem Relation Age of Onset  ? Heart disease Mother   ? Stroke Father   ? ? ?SOCIAL HISTORY ?Social History  ? ?Tobacco Use  ? Smoking status: Former  ?  Packs/day: 1.50  ?  Years: 10.00  ?  Pack years: 15.00  ?  Types: Cigarettes  ?  Quit date: 07/27/1982  ?  Years since quitting: 39.4  ? Smokeless tobacco: Never  ?Substance Use Topics  ? Alcohol use: No  ? Drug use: No  ? ?  ? ?  ? ?OPHTHALMIC EXAM: ? ?Base Eye Exam   ? ? Visual Acuity (ETDRS)   ? ?   Right Left  ?  Dist [place] 20/40 -2 CF at 3'  ? Dist ph [place] NI NI  ? ?  ?  ? ? Tonometry (Tonopen, 10:21 AM)   ? ?   Right Left  ? Pressure 9 12  ? ?  ?  ? ? Pupils   ? ?   Pupils Dark Light APD  ? Right PERRL 3 2.5 None  ? Left PERRL 3 2 None  ? ?  ?  ? ? Extraocular Movement   ? ?   Right Left  ?  Full Full  ? ?  ?  ? ? Neuro/Psych   ? ? Oriented x3: Yes  ? Mood/Affect: Normal  ? ?  ?  ? ? Dilation   ? ? Both eyes: 1.0% Mydriacyl, 2.5% Phenylephrine @ 10:21 AM  ? ?  ?  ? ?  ? ?Slit Lamp and Fundus Exam   ? ? External Exam   ? ?   Right Left  ? External Normal Normal  ? ?  ?  ? ? Slit Lamp Exam   ? ?   Right Left  ? Lids/Lashes Normal Normal  ? Conjunctiva/Sclera White and quiet White and quiet  ? Cornea Clear Clear  ? Anterior Chamber Deep and quiet  Deep and quiet  ? Iris Round and reactive Round and reactive  ? Lens Posterior chamber intraocular lens Posterior chamber intraocular lens  ? Anterior Vitreous Normal Normal  ? ?  ?  ? ? Fundus Exam   ? ?   Right Left  ? Posterior Vitreous Posterior vitreous detachment Posterior vitreous detachment  ? Disc Normal Normal  ? C/D Ratio 0.25 0.3  ? Macula Soft drusen nearly confluent and subfoveal, Retinal pigment epithelial mottling, Geographic atrophy Soft drusen, Retinal pigment epithelial mottling, Geographic atrophy subfoveal  ? Vessels Normal Normal  ? Periphery Normal Normal  ? ?  ?  ? ?  ? ? ?IMAGING AND PROCEDURES  ?Imaging and Procedures for 01/03/22 ? ?OCT, Retina - OU - Both Eyes   ? ?   ?Right Eye ?Quality was good. Scan locations included subfoveal. Central Foveal Thickness: 260. Progression has been stable. Findings include inner retinal atrophy, outer retinal atrophy, central retinal atrophy.  ? ?Left Eye ?Quality was good. Scan locations included subfoveal. Central Foveal Thickness: 181. Progression has been stable. Findings include abnormal foveal contour, retinal drusen , outer retinal atrophy, central retinal atrophy, inner retinal atrophy.  ? ?Notes ?No signs of active CNVM OU ? ?  ? ?Color Fundus Photography Optos - OU - Both Eyes   ? ?   ?Right Eye ?Progression has improved. Disc findings include normal observations. Macula : geographic atrophy, drusen. Vessels : normal observations. Periphery : normal observations.  ? ?Left Eye ?Progression has been stable. Disc findings include normal observations. Macula : geographic atrophy, drusen. Vessels : normal observations. Periphery : normal observations.  ? ?Notes ?No signs of active CNVM OU. ? ?  ? ? ?  ?  ? ?  ?ASSESSMENT/PLAN: ? ?Advanced nonexudative age-related macular degeneration of left eye with subfoveal involvement ?Subfoveal geographic AMD accounts for acuity ? ?Advanced nonexudative age-related macular degeneration of right eye without  subfoveal involvement ?Perifoveal dry AMD present, may be  candidate for injections in the future when medication approved  ? ?  ICD-10-CM   ?1. 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)  H35.3222 OCT, Retina - OU - Both Eyes  ?  Color Fundus Photography Optos -  OU - Both Eyes  ?  ?2. Advanced nonexudative age-related macular degeneration of left eye with subfoveal involvement  H35.3124   ?  ?3. Advanced nonexudative age-related macular degeneration of right eye without subfoveal involvement  H35.3113   ?  ? ? ?1.  OU no sign of CNVM will continue to monitor stable overall ? ?2. ? ?3. ? ?Ophthalmic Meds Ordered this visit:  ?No orders of the defined types were placed in this encounter. ? ? ?  ? ?Return in about 6 months (around 07/06/2022) for DILATE OU, COLOR FP, OCT,, dry AMD possible injection. ? ?There are no Patient Instructions on file for this visit. ? ? ?Explained the diagnoses, plan, and follow up with the patient and they expressed understanding.  Patient expressed understanding of the importance of proper follow up care.  ? ?Alford Highland. Stanislawa Gaffin M.D. ?Diseases & Surgery of the Retina and Vitreous ?[facility] ?01/03/22 ? ? ? ? ?Abbreviations: ?M myopia (nearsighted); A astigmatism; H hyperopia (farsighted); P presbyopia; Mrx spectacle prescription;  CTL contact lenses; OD right eye; OS left eye; OU both eyes  XT exotropia; ET esotropia; PEK punctate epithelial keratitis; PEE punctate epithelial erosions; DES dry eye syndrome; MGD meibomian gland dysfunction; ATs artificial tears; PFAT's preservative free artificial tears; NSC nuclear sclerotic cataract; PSC posterior subcapsular cataract; ERM epi-retinal membrane; PVD posterior vitreous detachment; RD retinal detachment; DM diabetes mellitus; DR diabetic retinopathy; NPDR non-proliferative diabetic retinopathy; PDR proliferative diabetic retinopathy; CSME clinically significant  macular edema; DME diabetic macular edema; dbh dot blot hemorrhages; CWS cotton wool spot; POAG primary open angle glaucoma; C/D cup-to-disc ratio; HVF humphrey visual field; GVF goldmann visual field; OCT optical

## 2022-01-04 ENCOUNTER — Ambulatory Visit: Payer: Medicare Other

## 2022-01-04 DIAGNOSIS — I4891 Unspecified atrial fibrillation: Secondary | ICD-10-CM | POA: Diagnosis not present

## 2022-01-04 DIAGNOSIS — Z5181 Encounter for therapeutic drug level monitoring: Secondary | ICD-10-CM | POA: Diagnosis not present

## 2022-01-04 DIAGNOSIS — Z7901 Long term (current) use of anticoagulants: Secondary | ICD-10-CM | POA: Diagnosis not present

## 2022-01-04 LAB — POCT INR: INR: 1.3 — AB (ref 2.0–3.0)

## 2022-01-04 NOTE — Patient Instructions (Signed)
Description   ?Take 1.5 tablets today and then START taking 1 tablet Daily except 0.5 tablet on Monday and Friday.  Recheck INR 1 week.  ? ?[facility] [phone] ?  ?   ?

## 2022-01-05 ENCOUNTER — Other Ambulatory Visit: Payer: Self-pay

## 2022-01-05 DIAGNOSIS — I10 Essential (primary) hypertension: Secondary | ICD-10-CM

## 2022-01-05 DIAGNOSIS — Z130 Encounter for screening for diseases of the blood and blood-forming organs and certain disorders involving the immune mechanism: Secondary | ICD-10-CM

## 2022-01-06 ENCOUNTER — Other Ambulatory Visit: Payer: Medicare Other

## 2022-01-06 DIAGNOSIS — Z130 Encounter for screening for diseases of the blood and blood-forming organs and certain disorders involving the immune mechanism: Secondary | ICD-10-CM

## 2022-01-06 DIAGNOSIS — Z1321 Encounter for screening for nutritional disorder: Secondary | ICD-10-CM | POA: Diagnosis not present

## 2022-01-06 DIAGNOSIS — Z13228 Encounter for screening for other metabolic disorders: Secondary | ICD-10-CM | POA: Diagnosis not present

## 2022-01-06 DIAGNOSIS — Z1329 Encounter for screening for other suspected endocrine disorder: Secondary | ICD-10-CM | POA: Diagnosis not present

## 2022-01-06 DIAGNOSIS — I10 Essential (primary) hypertension: Secondary | ICD-10-CM

## 2022-01-07 LAB — CBC WITH DIFFERENTIAL/PLATELET
Basophils Absolute: 0 10*3/uL (ref 0.0–0.2)
Basos: 1 %
EOS (ABSOLUTE): 0.2 10*3/uL (ref 0.0–0.4)
Eos: 2 %
Hematocrit: 45.2 % (ref 37.5–51.0)
Hemoglobin: 15.4 g/dL (ref 13.0–17.7)
Immature Grans (Abs): 0 10*3/uL (ref 0.0–0.1)
Immature Granulocytes: 0 %
Lymphocytes Absolute: 1 10*3/uL (ref 0.7–3.1)
Lymphs: 13 %
MCH: 29.8 pg (ref 26.6–33.0)
MCHC: 34.1 g/dL (ref 31.5–35.7)
MCV: 87 fL (ref 79–97)
Monocytes Absolute: 0.6 10*3/uL (ref 0.1–0.9)
Monocytes: 7 %
Neutrophils Absolute: 6.2 10*3/uL (ref 1.4–7.0)
Neutrophils: 77 %
Platelets: 195 10*3/uL (ref 150–450)
RBC: 5.17 x10E6/uL (ref 4.14–5.80)
RDW: 12.3 % (ref 11.6–15.4)
WBC: 8.1 10*3/uL (ref 3.4–10.8)

## 2022-01-07 LAB — COMPREHENSIVE METABOLIC PANEL
ALT: 32 IU/L (ref 0–44)
AST: 36 IU/L (ref 0–40)
Albumin/Globulin Ratio: 1.5 (ref 1.2–2.2)
Albumin: 4 g/dL (ref 3.6–4.6)
Alkaline Phosphatase: 98 IU/L (ref 44–121)
BUN/Creatinine Ratio: 15 (ref 10–24)
BUN: 18 mg/dL (ref 8–27)
Bilirubin Total: 0.6 mg/dL (ref 0.0–1.2)
CO2: 30 mmol/L — ABNORMAL HIGH (ref 20–29)
Calcium: 9.8 mg/dL (ref 8.6–10.2)
Chloride: 103 mmol/L (ref 96–106)
Creatinine, Ser: 1.18 mg/dL (ref 0.76–1.27)
Globulin, Total: 2.7 g/dL (ref 1.5–4.5)
Glucose: 100 mg/dL — ABNORMAL HIGH (ref 70–99)
Potassium: 4.7 mmol/L (ref 3.5–5.2)
Sodium: 147 mmol/L — ABNORMAL HIGH (ref 134–144)
Total Protein: 6.7 g/dL (ref 6.0–8.5)
eGFR: 61 mL/min/{1.73_m2} (ref >59)

## 2022-01-07 LAB — TSH: TSH: 4.11 u[IU]/mL (ref 0.450–4.500)

## 2022-01-07 LAB — LIPID PANEL
Chol/HDL Ratio: 3.6 ratio (ref 0.0–5.0)
Cholesterol, Total: 195 mg/dL (ref 100–199)
HDL: 54 mg/dL (ref >39)
LDL Chol Calc (NIH): 126 mg/dL — ABNORMAL HIGH (ref 0–99)
Triglycerides: 82 mg/dL (ref 0–149)
VLDL Cholesterol Cal: 15 mg/dL (ref 5–40)

## 2022-01-07 LAB — HEMOGLOBIN A1C
Est. average glucose Bld gHb Est-mCnc: 114 mg/dL
Hgb A1c MFr Bld: 5.6 % (ref 4.8–5.6)

## 2022-01-11 ENCOUNTER — Ambulatory Visit (INDEPENDENT_AMBULATORY_CARE_PROVIDER_SITE_OTHER): Payer: Medicare Other

## 2022-01-11 DIAGNOSIS — Z7901 Long term (current) use of anticoagulants: Secondary | ICD-10-CM

## 2022-01-11 DIAGNOSIS — I4891 Unspecified atrial fibrillation: Secondary | ICD-10-CM | POA: Diagnosis not present

## 2022-01-11 DIAGNOSIS — Z5181 Encounter for therapeutic drug level monitoring: Secondary | ICD-10-CM

## 2022-01-11 LAB — POCT INR: INR: 3.1 — AB (ref 2.0–3.0)

## 2022-01-11 NOTE — Patient Instructions (Signed)
Description   ?Only take 0.5 tablet tomorrow and then continue taking 1 tablet daily except 0.5 tablet on Monday and Friday.   ?Stay consistent with greens (1-2 times per week)  ?Recheck INR 2 weeks.  ?Coumadin Clinic [phone] ?  ?   ?

## 2022-01-19 DIAGNOSIS — C4441 Basal cell carcinoma of skin of scalp and neck: Secondary | ICD-10-CM | POA: Diagnosis not present

## 2022-01-19 DIAGNOSIS — L814 Other melanin hyperpigmentation: Secondary | ICD-10-CM | POA: Diagnosis not present

## 2022-01-19 DIAGNOSIS — Z85828 Personal history of other malignant neoplasm of skin: Secondary | ICD-10-CM | POA: Diagnosis not present

## 2022-01-19 DIAGNOSIS — L821 Other seborrheic keratosis: Secondary | ICD-10-CM | POA: Diagnosis not present

## 2022-01-19 DIAGNOSIS — C44319 Basal cell carcinoma of skin of other parts of face: Secondary | ICD-10-CM | POA: Diagnosis not present

## 2022-01-19 DIAGNOSIS — Z08 Encounter for follow-up examination after completed treatment for malignant neoplasm: Secondary | ICD-10-CM | POA: Diagnosis not present

## 2022-01-21 ENCOUNTER — Encounter: Payer: Self-pay | Admitting: Physician Assistant

## 2022-01-21 ENCOUNTER — Other Ambulatory Visit: Payer: Self-pay | Admitting: Physician Assistant

## 2022-01-21 MED ORDER — CETIRIZINE HCL 10 MG PO TABS: 10.0000 mg | ORAL_TABLET | Freq: Every day | ORAL | 1 refills | Status: DC

## 2022-01-21 MED ORDER — FINASTERIDE 5 MG PO TABS: 5.0000 mg | ORAL_TABLET | Freq: Every day | ORAL | 1 refills | Status: DC

## 2022-01-25 ENCOUNTER — Ambulatory Visit (INDEPENDENT_AMBULATORY_CARE_PROVIDER_SITE_OTHER): Payer: Medicare Other

## 2022-01-25 DIAGNOSIS — Z5181 Encounter for therapeutic drug level monitoring: Secondary | ICD-10-CM

## 2022-01-25 DIAGNOSIS — I4891 Unspecified atrial fibrillation: Secondary | ICD-10-CM | POA: Diagnosis not present

## 2022-01-25 DIAGNOSIS — Z7901 Long term (current) use of anticoagulants: Secondary | ICD-10-CM | POA: Diagnosis not present

## 2022-01-25 LAB — POCT INR: INR: 4.2 — AB (ref 2.0–3.0)

## 2022-01-25 NOTE — Patient Instructions (Signed)
Description   ?Hold today's dose and then START taking 1 tablet daily except 0.5 tablet on Monday, Wednesday and Friday.  ?Stay consistent with greens (1-2 times per week)  ?Recheck INR 2 weeks.  ?Coumadin Clinic [phone] ?  ?   ?

## 2022-02-02 ENCOUNTER — Emergency Department (HOSPITAL_COMMUNITY): Payer: Medicare Other

## 2022-02-02 ENCOUNTER — Observation Stay (HOSPITAL_COMMUNITY): Payer: Medicare Other

## 2022-02-02 ENCOUNTER — Inpatient Hospital Stay (HOSPITAL_COMMUNITY)
Admission: EM | Admit: 2022-02-02 | Discharge: 2022-02-05 | DRG: 557 | Disposition: A | Discharge status: Home Health | Payer: Medicare Other | Attending: Internal Medicine | Admitting: Internal Medicine

## 2022-02-02 ENCOUNTER — Encounter (HOSPITAL_COMMUNITY): Payer: Self-pay

## 2022-02-02 ENCOUNTER — Other Ambulatory Visit: Payer: Self-pay

## 2022-02-02 DIAGNOSIS — M6282 Rhabdomyolysis: Principal | ICD-10-CM | POA: Diagnosis present

## 2022-02-02 DIAGNOSIS — I088 Other rheumatic multiple valve diseases: Secondary | ICD-10-CM | POA: Diagnosis present

## 2022-02-02 DIAGNOSIS — I272 Pulmonary hypertension, unspecified: Secondary | ICD-10-CM | POA: Diagnosis not present

## 2022-02-02 DIAGNOSIS — R55 Syncope and collapse: Secondary | ICD-10-CM | POA: Diagnosis present

## 2022-02-02 DIAGNOSIS — I4819 Other persistent atrial fibrillation: Secondary | ICD-10-CM | POA: Diagnosis not present

## 2022-02-02 DIAGNOSIS — Y92009 Unspecified place in unspecified non-institutional (private) residence as the place of occurrence of the external cause: Secondary | ICD-10-CM | POA: Diagnosis not present

## 2022-02-02 DIAGNOSIS — W19XXXA Unspecified fall, initial encounter: Secondary | ICD-10-CM | POA: Diagnosis present

## 2022-02-02 DIAGNOSIS — Z20822 Contact with and (suspected) exposure to covid-19: Secondary | ICD-10-CM | POA: Diagnosis not present

## 2022-02-02 DIAGNOSIS — K5900 Constipation, unspecified: Secondary | ICD-10-CM | POA: Diagnosis not present

## 2022-02-02 DIAGNOSIS — J9811 Atelectasis: Secondary | ICD-10-CM | POA: Diagnosis not present

## 2022-02-02 DIAGNOSIS — I129 Hypertensive chronic kidney disease with stage 1 through stage 4 chronic kidney disease, or unspecified chronic kidney disease: Secondary | ICD-10-CM | POA: Diagnosis not present

## 2022-02-02 DIAGNOSIS — I493 Ventricular premature depolarization: Secondary | ICD-10-CM | POA: Diagnosis present

## 2022-02-02 DIAGNOSIS — G9341 Metabolic encephalopathy: Secondary | ICD-10-CM

## 2022-02-02 DIAGNOSIS — Z1611 Resistance to penicillins: Secondary | ICD-10-CM | POA: Diagnosis present

## 2022-02-02 DIAGNOSIS — N390 Urinary tract infection, site not specified: Secondary | ICD-10-CM | POA: Diagnosis not present

## 2022-02-02 DIAGNOSIS — Z7901 Long term (current) use of anticoagulants: Secondary | ICD-10-CM

## 2022-02-02 DIAGNOSIS — Z515 Encounter for palliative care: Secondary | ICD-10-CM | POA: Diagnosis not present

## 2022-02-02 DIAGNOSIS — I4891 Unspecified atrial fibrillation: Secondary | ICD-10-CM | POA: Diagnosis not present

## 2022-02-02 DIAGNOSIS — R0902 Hypoxemia: Secondary | ICD-10-CM | POA: Diagnosis not present

## 2022-02-02 DIAGNOSIS — I4821 Permanent atrial fibrillation: Secondary | ICD-10-CM | POA: Diagnosis present

## 2022-02-02 DIAGNOSIS — J90 Pleural effusion, not elsewhere classified: Secondary | ICD-10-CM | POA: Diagnosis not present

## 2022-02-02 DIAGNOSIS — R0989 Other specified symptoms and signs involving the circulatory and respiratory systems: Secondary | ICD-10-CM | POA: Diagnosis not present

## 2022-02-02 DIAGNOSIS — B964 Proteus (mirabilis) (morganii) as the cause of diseases classified elsewhere: Secondary | ICD-10-CM | POA: Diagnosis not present

## 2022-02-02 DIAGNOSIS — I878 Other specified disorders of veins: Secondary | ICD-10-CM | POA: Diagnosis present

## 2022-02-02 DIAGNOSIS — I350 Nonrheumatic aortic (valve) stenosis: Secondary | ICD-10-CM

## 2022-02-02 DIAGNOSIS — E8809 Other disorders of plasma-protein metabolism, not elsewhere classified: Secondary | ICD-10-CM | POA: Diagnosis not present

## 2022-02-02 DIAGNOSIS — I890 Lymphedema, not elsewhere classified: Secondary | ICD-10-CM | POA: Diagnosis not present

## 2022-02-02 DIAGNOSIS — K047 Periapical abscess without sinus: Secondary | ICD-10-CM | POA: Diagnosis present

## 2022-02-02 DIAGNOSIS — R531 Weakness: Secondary | ICD-10-CM

## 2022-02-02 DIAGNOSIS — N182 Chronic kidney disease, stage 2 (mild): Secondary | ICD-10-CM | POA: Diagnosis not present

## 2022-02-02 DIAGNOSIS — I10 Essential (primary) hypertension: Secondary | ICD-10-CM | POA: Diagnosis present

## 2022-02-02 DIAGNOSIS — H9190 Unspecified hearing loss, unspecified ear: Secondary | ICD-10-CM | POA: Diagnosis not present

## 2022-02-02 DIAGNOSIS — J9601 Acute respiratory failure with hypoxia: Secondary | ICD-10-CM

## 2022-02-02 DIAGNOSIS — R410 Disorientation, unspecified: Secondary | ICD-10-CM | POA: Diagnosis not present

## 2022-02-02 DIAGNOSIS — J449 Chronic obstructive pulmonary disease, unspecified: Secondary | ICD-10-CM | POA: Diagnosis not present

## 2022-02-02 DIAGNOSIS — R42 Dizziness and giddiness: Secondary | ICD-10-CM | POA: Diagnosis not present

## 2022-02-02 DIAGNOSIS — K567 Ileus, unspecified: Secondary | ICD-10-CM

## 2022-02-02 DIAGNOSIS — R0689 Other abnormalities of breathing: Secondary | ICD-10-CM | POA: Diagnosis not present

## 2022-02-02 DIAGNOSIS — R600 Localized edema: Secondary | ICD-10-CM | POA: Diagnosis present

## 2022-02-02 DIAGNOSIS — E860 Dehydration: Secondary | ICD-10-CM | POA: Diagnosis not present

## 2022-02-02 DIAGNOSIS — G319 Degenerative disease of nervous system, unspecified: Secondary | ICD-10-CM | POA: Diagnosis not present

## 2022-02-02 DIAGNOSIS — N400 Enlarged prostate without lower urinary tract symptoms: Secondary | ICD-10-CM | POA: Diagnosis present

## 2022-02-02 DIAGNOSIS — J441 Chronic obstructive pulmonary disease with (acute) exacerbation: Secondary | ICD-10-CM

## 2022-02-02 DIAGNOSIS — R61 Generalized hyperhidrosis: Secondary | ICD-10-CM | POA: Diagnosis not present

## 2022-02-02 DIAGNOSIS — Z87891 Personal history of nicotine dependence: Secondary | ICD-10-CM | POA: Diagnosis not present

## 2022-02-02 DIAGNOSIS — J320 Chronic maxillary sinusitis: Secondary | ICD-10-CM | POA: Diagnosis not present

## 2022-02-02 DIAGNOSIS — I6782 Cerebral ischemia: Secondary | ICD-10-CM | POA: Diagnosis not present

## 2022-02-02 DIAGNOSIS — R29818 Other symptoms and signs involving the nervous system: Secondary | ICD-10-CM | POA: Diagnosis not present

## 2022-02-02 LAB — RESP PANEL BY RT-PCR (FLU A&B, COVID) ARPGX2
Influenza A by PCR: NEGATIVE
Influenza B by PCR: NEGATIVE
SARS Coronavirus 2 by RT PCR: NEGATIVE

## 2022-02-02 LAB — I-STAT CHEM 8, ED
BUN: 21 mg/dL (ref 8–23)
Calcium, Ion: 1.1 mmol/L — ABNORMAL LOW (ref 1.15–1.40)
Chloride: 102 mmol/L (ref 98–111)
Creatinine, Ser: 1.2 mg/dL (ref 0.61–1.24)
Glucose, Bld: 112 mg/dL — ABNORMAL HIGH (ref 70–99)
HCT: 41 % (ref 39.0–52.0)
Hemoglobin: 13.9 g/dL (ref 13.0–17.0)
Potassium: 3.9 mmol/L (ref 3.5–5.1)
Sodium: 138 mmol/L (ref 135–145)
TCO2: 27 mmol/L (ref 22–32)

## 2022-02-02 LAB — PROTIME-INR
INR: 2.4 — ABNORMAL HIGH (ref 0.8–1.2)
Prothrombin Time: 25.7 seconds — ABNORMAL HIGH (ref 11.4–15.2)

## 2022-02-02 LAB — COMPREHENSIVE METABOLIC PANEL
ALT: 39 U/L (ref 0–44)
AST: 124 U/L — ABNORMAL HIGH (ref 15–41)
Albumin: 3.1 g/dL — ABNORMAL LOW (ref 3.5–5.0)
Alkaline Phosphatase: 59 U/L (ref 38–126)
Anion gap: 9 (ref 5–15)
BUN: 19 mg/dL (ref 8–23)
CO2: 25 mmol/L (ref 22–32)
Calcium: 8.9 mg/dL (ref 8.9–10.3)
Chloride: 104 mmol/L (ref 98–111)
Creatinine, Ser: 1.29 mg/dL — ABNORMAL HIGH (ref 0.61–1.24)
GFR, Estimated: 55 mL/min — ABNORMAL LOW (ref >60)
Glucose, Bld: 113 mg/dL — ABNORMAL HIGH (ref 70–99)
Potassium: 3.7 mmol/L (ref 3.5–5.1)
Sodium: 138 mmol/L (ref 135–145)
Total Bilirubin: 1.2 mg/dL (ref 0.3–1.2)
Total Protein: 6.3 g/dL — ABNORMAL LOW (ref 6.5–8.1)

## 2022-02-02 LAB — DIFFERENTIAL
Abs Immature Granulocytes: 0.05 10*3/uL (ref 0.00–0.07)
Basophils Absolute: 0.1 10*3/uL (ref 0.0–0.1)
Basophils Relative: 1 %
Eosinophils Absolute: 0.1 10*3/uL (ref 0.0–0.5)
Eosinophils Relative: 1 %
Immature Granulocytes: 1 %
Lymphocytes Relative: 8 %
Lymphs Abs: 0.9 10*3/uL (ref 0.7–4.0)
Monocytes Absolute: 1.2 10*3/uL — ABNORMAL HIGH (ref 0.1–1.0)
Monocytes Relative: 11 %
Neutro Abs: 8.5 10*3/uL — ABNORMAL HIGH (ref 1.7–7.7)
Neutrophils Relative %: 78 %

## 2022-02-02 LAB — APTT: aPTT: 32 seconds (ref 24–36)

## 2022-02-02 LAB — CBC
HCT: 45.2 % (ref 39.0–52.0)
Hemoglobin: 14.8 g/dL (ref 13.0–17.0)
MCH: 29.7 pg (ref 26.0–34.0)
MCHC: 32.7 g/dL (ref 30.0–36.0)
MCV: 90.6 fL (ref 80.0–100.0)
Platelets: 148 10*3/uL — ABNORMAL LOW (ref 150–400)
RBC: 4.99 MIL/uL (ref 4.22–5.81)
RDW: 13.2 % (ref 11.5–15.5)
WBC: 10.7 10*3/uL — ABNORMAL HIGH (ref 4.0–10.5)
nRBC: 0 % (ref 0.0–0.2)

## 2022-02-02 LAB — PHOSPHORUS: Phosphorus: 3.6 mg/dL (ref 2.5–4.6)

## 2022-02-02 LAB — ETHANOL: Alcohol, Ethyl (B): <10 mg/dL (ref <10)

## 2022-02-02 LAB — CBG MONITORING, ED: Glucose-Capillary: 114 mg/dL — ABNORMAL HIGH (ref 70–99)

## 2022-02-02 LAB — MAGNESIUM: Magnesium: 1.8 mg/dL (ref 1.7–2.4)

## 2022-02-02 LAB — D-DIMER, QUANTITATIVE: D-Dimer, Quant: 0.35 ug/mL-FEU (ref 0.00–0.50)

## 2022-02-02 LAB — TROPONIN I (HIGH SENSITIVITY): Troponin I (High Sensitivity): 22 ng/L — ABNORMAL HIGH (ref <18)

## 2022-02-02 LAB — CK: Total CK: 1747 U/L — ABNORMAL HIGH (ref 49–397)

## 2022-02-02 LAB — BRAIN NATRIURETIC PEPTIDE: B Natriuretic Peptide: 94.6 pg/mL (ref 0.0–100.0)

## 2022-02-02 MED ORDER — SODIUM CHLORIDE 0.9 % IV SOLN
100.0000 mL/h | INTRAVENOUS | Status: DC
  Administered 2022-02-03: 100 mL/h via INTRAVENOUS

## 2022-02-02 MED ORDER — ALBUTEROL SULFATE (2.5 MG/3ML) 0.083% IN NEBU: 2.5000 mg | INHALATION_SOLUTION | RESPIRATORY_TRACT | Status: DC | PRN

## 2022-02-02 MED ORDER — WARFARIN - PHARMACIST DOSING INPATIENT: Freq: Every day | Status: DC

## 2022-02-02 MED ORDER — SODIUM CHLORIDE 0.9 % IV BOLUS
500.0000 mL | Freq: Once | INTRAVENOUS | Status: AC
  Administered 2022-02-02: 500 mL via INTRAVENOUS

## 2022-02-02 NOTE — Assessment & Plan Note (Signed)
Chronic partially could be secondary to low albumin would benefit from nutritional consult ?

## 2022-02-02 NOTE — H&P (Signed)
? ? ?[facility]. [id number] DOB: [birth year] DOA: 02/02/2022 ?  ?  ?PCP: Lorrene Reid, PA-C   ?Outpatient Specialists:   ?CARDS: Dr. Percival Spanish ?  ? ?Patient arrived to ER on 02/02/22 at 1530 ?Referred by Attending Dorie Rank, MD ? ? ?Patient coming from:   ? home Lives     With family ?  ? ?Chief Complaint:   ?Chief Complaint  ?Patient presents with  ? Fall  ? Weakness  ? ? ?HPI: ?Warren Marcus. is a 87 y.o. male with medical history significant of  HTN, A.fib on coumadin ? Venous statis, lymphedema,  ? ?Presented with   near syncope ?Presents with generalized weakness and fall he lowered himself on the grown. He was walking ot the bathroom ?Even yesterday he was fatigued, progresssivley getting worse, to family it seems he was about to pass out ?Family called 911. In ER he has denied most complaints ?On coumadin for a.fib ? Have been wheezing a bit more lately ?Used to smoke quit 50 years ago ?No CP no fever ?But he felt sweaty and shaky ? ? Initial COVID TEST  ?NEGATIVE  ? ?Lab Results  ?Component Value Date  ? [place] NEGATIVE 02/02/2022  ? ?  ?Regarding pertinent Chronic problems:   ?   ? ? HTN on toprol ?Last echo 2021 no CHF aortic root enlargement.  ?    ? A. Fib -  - CHA2DS2 vas score    3   ?  ? current  on anticoagulation with  Coumadin     ?       -  Rate control:  Currently controlled with  Toprolol,    ?  ? ? CKD stage II - baseline Cr 1.2 ?CrCl cannot be calculated (Unknown ideal weight.). ? ?Lab Results  ?Component Value Date  ? CREATININE 1.20 02/02/2022  ? CREATININE 1.29 (H) 02/02/2022  ? CREATININE 1.18 01/06/2022  ? ?    ?While in ER: ?Clinical Course as of 02/02/22 2108  ?Wed Feb 02, 2022  ?1843 Comprehensive metabolic panel(!) ?Creatinine increased compared to previous [JK]  ?[place](!) ?Therapeutic range [JK]  ?1843 CBC(!) ?White blood cell count slightly increased [JK]  ?[place] ?Head CT without acute findings, periapical lucency noted, patient  is without dental pain [JK]  ?1843 DG Chest Portable 1 View ?Small small pleural effusions noted [JK]  ?2106 D/w Dr Roel Cluck [JK]  ?  ?Clinical Course User Index ?[JK] Dorie Rank, MD  ? ?Ordered ? ?CT HEAD  NON acute ? ?CXR - bilateral pleural effusion ?  ? ?Following Medications were ordered in ER: ?Medications  ?sodium chloride 0.9 % bolus 500 mL (0 mLs Intravenous Stopped 02/02/22 1848)  ?  Followed by  ?0.9 %  sodium chloride infusion (has no administration in time range)  ?   ?ED Triage Vitals  ?Enc Vitals Group  ?   BP 02/02/22 1544 115/74  ?   Pulse Rate 02/02/22 1544 73  ?   Resp 02/02/22 1544 20  ?   Temp 02/02/22 1544 98.3 ?F (36.8 ?C)  ?   Temp Source 02/02/22 1544 Oral  ?   SpO2 02/02/22 1544 96 %  ?   Weight --   ?   Height --   ?   Head Circumference --   ?   Peak Flow --   ?   Pain Score 02/02/22 1545 0  ?   Pain Loc --   ?  Pain Edu? --   ?   Excl. in [place]? --   ?[id number]    ? _________________________________________ ?Significant initial  Findings: ?Abnormal Labs Reviewed  ?PROTIME-INR - Abnormal; Notable for the following components:  ?    Result Value  ? Prothrombin Time 25.7 (*)   ? INR 2.4 (*)   ? All other components within normal limits  ?CBC - Abnormal; Notable for the following components:  ? WBC 10.7 (*)   ? Platelets 148 (*)   ? All other components within normal limits  ?DIFFERENTIAL - Abnormal; Notable for the following components:  ? Neutro Abs 8.5 (*)   ? Monocytes Absolute 1.2 (*)   ? All other components within normal limits  ?COMPREHENSIVE METABOLIC PANEL - Abnormal; Notable for the following components:  ? Glucose, Bld 113 (*)   ? Creatinine, Ser 1.29 (*)   ? Total Protein 6.3 (*)   ? Albumin 3.1 (*)   ? AST 124 (*)   ? GFR, Estimated 55 (*)   ? All other components within normal limits  ?CBG MONITORING, ED - Abnormal; Notable for the following components:  ? Glucose-Capillary 114 (*)   ? All other components within normal limits  ?I-STAT CHEM 8, ED - Abnormal; Notable for the  following components:  ? Glucose, Bld 112 (*)   ? Calcium, Ion 1.10 (*)   ? All other components within normal limits  ? ?  ?_________________________ ?Troponin 22 ordered ?ECG: Ordered ?Personally reviewed by me showing: ?HR : 113 ?Rhythm: Atrial fibrillation ?Ventricular bigeminy ?Anterior infarct, old ?bigeminy is new since prior tracing ?QTC 373 ? ?The recent clinical data is shown below. ?Vitals:  ? 02/02/22 1937 02/02/22 2000 02/02/22 2015 02/02/22 2031  ?BP: 119/75 121/68 119/74   ?Pulse: 69 63 60   ?Resp: (!) 24 (!) 27 (!) 21   ?Temp:      ?TempSrc:      ?SpO2: 97% 96% 98% 98%  ? ? ?WBC ? ?   ?Component Value Date/Time  ? WBC 10.7 (H) 02/02/2022 1551  ? LYMPHSABS 0.9 02/02/2022 1551  ? LYMPHSABS 1.0 01/06/2022 0909  ? MONOABS 1.2 (H) 02/02/2022 1551  ? EOSABS 0.1 02/02/2022 1551  ? EOSABS 0.2 01/06/2022 0909  ? BASOSABS 0.1 02/02/2022 1551  ? BASOSABS 0.0 01/06/2022 0909  ?  ? ?Lactic Acid, Venous ?No results found for: LATICACIDVEN ?  ? ? UA  ordered  ? ?Results for orders placed or performed during the hospital encounter of 02/02/22  ?Resp Panel by RT-PCR (Flu A&B, Covid) Nasopharyngeal Swab     Status: None  ? Collection Time: 02/02/22  4:01 PM  ? Specimen: Nasopharyngeal Swab; Nasopharyngeal(NP) swabs in vial transport medium  ?Result Value Ref Range Status  ? SARS Coronavirus 2 by RT PCR NEGATIVE NEGATIVE Final  ?     ? Influenza A by PCR NEGATIVE NEGATIVE Final  ? Influenza B by PCR NEGATIVE NEGATIVE Final  ?     ? _______________________________________ ?Hospitalist was called for admission for   Near syncope ?  ?   ?The following Work up has been ordered so far: ? ?Orders Placed This Encounter  ?Procedures  ? Resp Panel by RT-PCR (Flu A&B, Covid) Nasopharyngeal Swab  ? CT HEAD WO CONTRAST  ? DG Chest Portable 1 View  ? Ethanol  ? Protime-INR  ? APTT  ? CBC  ? Differential  ? Comprehensive metabolic panel  ? Urine rapid drug screen (hosp performed)  ? Urinalysis, Routine w  reflex microscopic  ? Brain  natriuretic peptide  ? D-dimer, quantitative  ? Diet NPO time specified  ? Vital signs  ? Cardiac Monitoring  ? Modified Stroke Scale (mNIHSS) Document mNIHSS assessment every 2 hours for a total of 12 hours  ? Swallow screen  ? Initiate Carrier Fluid Protocol  ? If O2 sat If O2 Sat < 94%, administer O2 at 2 liters/minute via nasal cannula.  ? Consult to hospitalist  ? Pulse oximetry, continuous  ? CBG monitoring, ED  ? I-stat chem 8, ED  ? EKG 12-Lead  ? ED EKG  ? [place]ine lock IV  ?  ? ?OTHER Significant initial  Findings: ? ?labs showing: ? ?  ?Recent Labs  ?Lab 02/02/22 ?1551 02/02/22 ?1619  ?NA 138 138  ?K 3.7 3.9  ?CO2 25  --   ?GLUCOSE 113* 112*  ?BUN 19 21  ?CREATININE 1.29* 1.20  ?CALCIUM 8.9  --   ? ? ?Cr  stable,  ?Lab Results  ?Component Value Date  ? CREATININE 1.20 02/02/2022  ? CREATININE 1.29 (H) 02/02/2022  ? CREATININE 1.18 01/06/2022  ? ? ?Recent Labs  ?Lab 02/02/22 ?1551  ?AST 124*  ?ALT 39  ?ALKPHOS 59  ?BILITOT 1.2  ?PROT 6.3*  ?ALBUMIN 3.1*  ? ?Lab Results  ?Component Value Date  ? CALCIUM 8.9 02/02/2022  ?  ?   ?Plt: ?Lab Results  ?Component Value Date  ? PLT 148 (L) 02/02/2022  ?  ?COVID-19 Labs ? ?Recent Labs  ?  02/02/22 ?1551  ?DDIMER 0.35  ? ? ?Lab Results  ?Component Value Date  ? [place] NEGATIVE 02/02/2022  ? ? ?   ?Recent Labs  ?Lab 02/02/22 ?1551 02/02/22 ?1619  ?WBC 10.7*  --   ?NEUTROABS 8.5*  --   ?HGB 14.8 13.9  ?HCT 45.2 41.0  ?MCV 90.6  --   ?PLT 148*  --   ? ? ?HG/HCT stable,  ?   ?Component Value Date/Time  ? HGB 13.9 02/02/2022 1619  ? HGB 15.4 01/06/2022 0909  ? HCT 41.0 02/02/2022 1619  ? HCT 45.2 01/06/2022 0909  ? MCV 90.6 02/02/2022 1551  ? MCV 87 01/06/2022 0909  ?  ?Cardiac Panel (last 3 results) ?No results for input(s): CKTOTAL, CKMB, TROPONINI, RELINDX in the last 72 hours. ? .car ?BNP (last 3 results) ?Recent Labs  ?  02/02/22 ?1551  ?BNP 94.6  ?  ? ?DM  labs:  ?HbA1C: ?Recent Labs  ?  01/06/22 ?0909  ?HGBA1C 5.6  ? ?  ?  ?CBG (last 3)  ?Recent Labs  ?   02/02/22 ?1546  ?GLUCAP 114*  ? ?   ?Cultures: ?   ?Component Value Date/Time  ? [place], CLEAN CATCH 08/23/2014 1355  ? [place] NONE 08/23/2014 1355  ? CULT  08/23/2014 1355  ?  ESCHERICHIA COLI ?Performe

## 2022-02-02 NOTE — Progress Notes (Signed)
ANTICOAGULATION CONSULT NOTE - Initial Consult ? ?Pharmacy Consult for warfarin ?Indication: atrial fibrillation ? ?No Known Allergies ? ?Patient Measurements: ?Weight: 100 kg  ? ?Vital Signs: ?Temp: 98.3 ?F (36.8 ?C) (04/26 1544) ?Temp Source: Oral (04/26 1544) ?BP: 119/74 (04/26 2015) ?Pulse Rate: 60 (04/26 2015) ? ?Labs: ?Recent Labs  ?  02/02/22 ?1551 02/02/22 ?1619  ?HGB 14.8 13.9  ?HCT 45.2 41.0  ?PLT 148*  --   ?APTT 32  --   ?LABPROT 25.7*  --   ?INR 2.4*  --   ?CREATININE 1.29* 1.20  ? ? ?CrCl cannot be calculated (Unknown ideal weight.). ? ? ?Medical History: ?Past Medical History:  ?Diagnosis Date  ? Atrial fibrillation ([place])   ? Hearing loss   ? Hypertension   ? Prostate enlargement   ? Umbilical hernia [id number]  ? ? ?Medications:  ?(Not in a hospital admission)  ? ?Assessment: ?40 YOM who presented to the ED with acute onset of weakness and change in mental status concerning for TIA. Pt is on warfarin PTA for h/o AFib.  ? ?INR therapeutic at 2.4 on admission. Last dose of warfarin was earlier this morning.  ? ?Home warfarin dose: 2.5 mg on Mon, Wed, Fri; 5 mg on all other days.  ? ?Goal of Therapy:  ?INR 2-3 ?Monitor platelets by anticoagulation protocol: Yes ?  ?Plan:  ?-No further warfarin dose today ?-F/u INR in AM and dose warfarin accordingly  ?-Monitor for s/s of bleeding  ? ?Albertina Parr, PharmD., BCCCP ?Clinical Pharmacist ?Please refer to AMION for unit-specific pharmacist  ? ? ? ?

## 2022-02-02 NOTE — ED Provider Notes (Signed)
?[facility] ?Provider Note ? ? ?CSN: [id number] ?Arrival date & time: 02/02/22  1530 ? ?  ? ?History ? ?Chief Complaint  ?Patient presents with  ? Fall  ? Weakness  ? ? ?Warren Wagner. is a 87 y.o. male. ? ? ?Fall ? ?Weakness ?Associated symptoms: no fever   ? ?Patient presents to the ED for evaluation of weakness and confusion.  Family states for the last couple days they have noticed that he has been a little bit weaker than usual.  He has had some more difficulty walking.  They have also noticed some increasing leg swelling.  Daughter states that today he stood up to go to the bathroom and he looked somewhat confused, he was diaphoretic, and had trouble walking.  He seemed to be having difficulty moving his legs.  He looked as if he was going to pass out but eventually he made it to the bathroom.  He ended up taking longer than usual to go to the bathroom.  Family ended up calling 911.  Patient himself denies any complaints.  He states he feels fine.  He is not having any headache.  No chest pain or abdominal pain.  He denies feeling short of breath. ? ?Home Medications ?Prior to Admission medications   ?Medication Sig Start Date End Date Taking? Authorizing Provider  ?cetirizine (ZYRTEC) 10 MG tablet Take 1 tablet (10 mg total) by mouth daily. 01/21/22   Lorrene Reid, PA-C  ?finasteride (PROSCAR) 5 MG tablet Take 1 tablet (5 mg total) by mouth daily. 01/21/22   Lorrene Reid, PA-C  ?fish oil-omega-3 fatty acids 1000 MG capsule Take 1 g by mouth daily.    [provider]  ?furosemide (LASIX) 40 MG tablet Take 1 tablet (40 mg total) by mouth daily. 06/17/21   Warren Lacy, PA-C  ?metoprolol succinate (TOPROL-XL) 50 MG 24 hr tablet Take 1 tablet (50 mg total) by mouth daily. Take with or immediately following a meal. 12/29/21   Lorrene Reid, PA-C  ?Multiple Vitamins-Minerals (MULTIVITAMIN WITH MINERALS) tablet Take 1 tablet by mouth daily.    [provider]  ?traZODone (DESYREL) 50 MG tablet Take 0.5-1 tablets (25-50 mg total) by mouth at bedtime as needed for sleep. 12/29/21   Lorrene Reid, PA-C  ?warfarin (COUMADIN) 5 MG tablet TAKE 1 TO 2 TABLETS BY MOUTH ONCE DAILY AS  DIRECTED 11/30/21   Vickie Epley, MD  ?   ? ?Allergies    ?Patient has no known allergies.   ? ?Review of Systems   ?Review of Systems  ?Constitutional:  Negative for fever.  ?Neurological:  Positive for weakness.  ? ?Physical Exam ?Updated Vital Signs ?BP 119/75   Pulse 69   Temp 98.3 ?F (36.8 ?C) (Oral)   Resp (!) 24   SpO2 97%  ?Physical Exam ?Vitals and nursing note reviewed.  ?Constitutional:   ?   Appearance: He is well-developed. He is not diaphoretic.  ?HENT:  ?   Head: Normocephalic and atraumatic.  ?   Right Ear: External ear normal.  ?   Left Ear: External ear normal.  ?Eyes:  ?   General: No scleral icterus.    ?   Right eye: No discharge.     ?   Left eye: No discharge.  ?   Conjunctiva/sclera: Conjunctivae normal.  ?Neck:  ?   Trachea: No tracheal deviation.  ?Cardiovascular:  ?   Rate and Rhythm: Normal rate and regular rhythm.  ?  Pulmonary:  ?   Effort: Pulmonary effort is normal. No respiratory distress.  ?   Breath sounds: Normal breath sounds. No stridor. No wheezing or rales.  ?Abdominal:  ?   General: Bowel sounds are normal. There is no distension.  ?   Palpations: Abdomen is soft.  ?   Tenderness: There is no abdominal tenderness. There is no guarding or rebound.  ?Musculoskeletal:     ?   General: Swelling present. No tenderness or deformity.  ?   Cervical back: Neck supple.  ?   Right lower leg: Edema present.  ?   Left lower leg: Edema present.  ?   Comments: Edema bilateral lower extremities below the knee, left greater than right  ?Skin: ?   General: Skin is warm and dry.  ?   Findings: No rash.  ?Neurological:  ?   General: No focal deficit present.  ?   Mental Status: He is alert.  ?   GCS: GCS eye subscore is 4. GCS verbal subscore is 4. GCS  motor subscore is 6.  ?   Cranial Nerves: No cranial nerve deficit (no facial droop, extraocular movements intact, no slurred speech).  ?   Sensory: No sensory deficit.  ?   Motor: Weakness present. No abnormal muscle tone or seizure activity.  ?   Coordination: Coordination normal.  ?   Comments: Patient able lift both arms off the bed without difficulty, patient is able to move both lower extremities but has difficulty lifting either completely off the bed, answering questions appropriately, alert and oriented to person and place but not the date  ?Psychiatric:     ?   Mood and Affect: Mood normal.  ? ? ?ED Results / Procedures / Treatments   ?Labs ?(all labs ordered are listed, but only abnormal results are displayed) ?Labs Reviewed  ?PROTIME-INR - Abnormal; Notable for the following components:  ?    Result Value  ? Prothrombin Time 25.7 (*)   ? INR 2.4 (*)   ? All other components within normal limits  ?CBC - Abnormal; Notable for the following components:  ? WBC 10.7 (*)   ? Platelets 148 (*)   ? All other components within normal limits  ?DIFFERENTIAL - Abnormal; Notable for the following components:  ? Neutro Abs 8.5 (*)   ? Monocytes Absolute 1.2 (*)   ? All other components within normal limits  ?COMPREHENSIVE METABOLIC PANEL - Abnormal; Notable for the following components:  ? Glucose, Bld 113 (*)   ? Creatinine, Ser 1.29 (*)   ? Total Protein 6.3 (*)   ? Albumin 3.1 (*)   ? AST 124 (*)   ? GFR, Estimated 55 (*)   ? All other components within normal limits  ?CBG MONITORING, ED - Abnormal; Notable for the following components:  ? Glucose-Capillary 114 (*)   ? All other components within normal limits  ?I-STAT CHEM 8, ED - Abnormal; Notable for the following components:  ? Glucose, Bld 112 (*)   ? Calcium, Ion 1.10 (*)   ? All other components within normal limits  ?RESP PANEL BY RT-PCR (FLU A&B, COVID) [id number]  ?ETHANOL  ?APTT  ?BRAIN NATRIURETIC PEPTIDE  ?D-DIMER, QUANTITATIVE  ?RAPID URINE DRUG SCREEN,  HOSP PERFORMED  ?URINALYSIS, ROUTINE W REFLEX MICROSCOPIC  ? ? ?EKG ?EKG Interpretation ? ?Date/Time:  Wednesday February 02 2022 15:37:26 EDT ?Ventricular Rate:  113 ?PR Interval:    ?QRS Duration: 105 ?QT Interval:  367 ?QTC Calculation: 373 ?  R Axis:   113 ?Text Interpretation: Atrial fibrillation Ventricular bigeminy Anterior infarct, old bigeminy is new since? prior tracing Confirmed by Dorie Rank [phone]) on 02/02/2022 3:52:04 PM ? ?Radiology ?CT HEAD WO CONTRAST ? ?Result Date: 02/02/2022 ?CLINICAL DATA:  Neuro deficit.  Stroke suspected. EXAM: CT HEAD WITHOUT CONTRAST TECHNIQUE: Contiguous axial images were obtained from the base of the skull through the vertex without intravenous contrast. RADIATION DOSE REDUCTION: This exam was performed according to the departmental dose-optimization program which includes automated exposure control, adjustment of the mA and/or kV according to patient size and/or use of iterative reconstruction technique. COMPARISON:  None FINDINGS: Brain: No evidence of acute infarction, hemorrhage, hydrocephalus, extra-axial collection or mass lesion/mass effect. There is mild diffuse low-attenuation within the subcortical and periventricular white matter compatible with chronic microvascular disease. Prominence of the sulci and ventricles compatible with brain atrophy. Vascular: No hyperdense vessel or unexpected calcification. Skull: Normal. Negative for fracture or focal lesion. Sinuses/Orbits: Moderate, chronic mucoperiosteal thickening is identified involving the left maxillary sinus. Partial opacification of the frontal sinus. Other: There is a periapical lucency involving a left upper tooth for which periapical abscess cannot be excluded, image 86/5. IMPRESSION: 1. No acute intracranial abnormalities. 2. Chronic small vessel ischemic disease and brain atrophy. 3. Chronic left maxillary sinus inflammation. 4. Periapical lucency involving a left upper tooth for which periapical abscess  cannot be excluded. Electronically Signed   By: Kerby Moors M.D.   On: 02/02/2022 17:25  ? ?DG Chest Portable 1 View ? ?Result Date: 02/02/2022 ?CLINICAL DATA:  Diaphoresis, confusion, weakness. EXAM: PORTAB

## 2022-02-02 NOTE — Assessment & Plan Note (Signed)
Continue home meds Proscar ?

## 2022-02-02 NOTE — Assessment & Plan Note (Signed)
Will need PT OT assessment prior to discharge patient may be debilitated to look for any source of underlying infection.  Noted to be slightly hypoalbuminemic could be contributing to lymphedema.  And third spacing.  Could be intravascular slightly fluid down gentle fluids if needed. ?Obtain troponin noted bigeminy on EKG patient is already on Toprol monitor heart rate and BP ?Echogram ordered if abnormal may need cardiology consult ?

## 2022-02-02 NOTE — Assessment & Plan Note (Signed)
On Toprol currently blood pressure stable continue if BP allows ?

## 2022-02-02 NOTE — Assessment & Plan Note (Signed)
On anticoagulation with Coumadin therapeutic, rate controlled with Toprol continue if heart rate and blood pressure allows ?

## 2022-02-02 NOTE — Subjective & Objective (Signed)
Presents with generalized weakness and fall he lowered himself on the grown. He was walking ot the bathroom ?Even yesterday he was fatigued, progresssivley getting worse, to family it seems he was about to pass out ?Family called 911. In ER he has denied most complaints ?On coumadin for a.fib ?

## 2022-02-02 NOTE — ED Triage Notes (Signed)
Pt bib [facility] from home with complaints of generalized weakness and a fall where he lowered himself to the ground. Pt was on the floor when ems arrived but denies hitting his head or passing out. Pt has had increased leg swelling for a couple days. EMS vitals 130/70, 98% RA ?Pt does take warfarin for afib ?

## 2022-02-02 NOTE — ED Triage Notes (Signed)
Pt daughter arrives and states that she found him standing up to go to the bathroom, and "looked confused, got sweaty, and was not acting right". Pt daughter states that he has not been "normal" since yesterday ?

## 2022-02-03 ENCOUNTER — Observation Stay (HOSPITAL_COMMUNITY): Payer: Medicare Other

## 2022-02-03 ENCOUNTER — Encounter (HOSPITAL_COMMUNITY): Payer: Self-pay | Admitting: Internal Medicine

## 2022-02-03 DIAGNOSIS — I088 Other rheumatic multiple valve diseases: Secondary | ICD-10-CM | POA: Diagnosis not present

## 2022-02-03 DIAGNOSIS — I340 Nonrheumatic mitral (valve) insufficiency: Secondary | ICD-10-CM | POA: Diagnosis not present

## 2022-02-03 DIAGNOSIS — G9341 Metabolic encephalopathy: Secondary | ICD-10-CM | POA: Diagnosis not present

## 2022-02-03 DIAGNOSIS — Z515 Encounter for palliative care: Secondary | ICD-10-CM | POA: Diagnosis not present

## 2022-02-03 DIAGNOSIS — E8809 Other disorders of plasma-protein metabolism, not elsewhere classified: Secondary | ICD-10-CM | POA: Diagnosis not present

## 2022-02-03 DIAGNOSIS — Y92009 Unspecified place in unspecified non-institutional (private) residence as the place of occurrence of the external cause: Secondary | ICD-10-CM | POA: Diagnosis not present

## 2022-02-03 DIAGNOSIS — N182 Chronic kidney disease, stage 2 (mild): Secondary | ICD-10-CM | POA: Diagnosis not present

## 2022-02-03 DIAGNOSIS — J90 Pleural effusion, not elsewhere classified: Secondary | ICD-10-CM | POA: Diagnosis not present

## 2022-02-03 DIAGNOSIS — B964 Proteus (mirabilis) (morganii) as the cause of diseases classified elsewhere: Secondary | ICD-10-CM | POA: Diagnosis not present

## 2022-02-03 DIAGNOSIS — Z20822 Contact with and (suspected) exposure to covid-19: Secondary | ICD-10-CM | POA: Diagnosis not present

## 2022-02-03 DIAGNOSIS — M6282 Rhabdomyolysis: Secondary | ICD-10-CM | POA: Diagnosis present

## 2022-02-03 DIAGNOSIS — Z7189 Other specified counseling: Secondary | ICD-10-CM

## 2022-02-03 DIAGNOSIS — J9601 Acute respiratory failure with hypoxia: Secondary | ICD-10-CM | POA: Diagnosis not present

## 2022-02-03 DIAGNOSIS — J449 Chronic obstructive pulmonary disease, unspecified: Secondary | ICD-10-CM | POA: Diagnosis present

## 2022-02-03 DIAGNOSIS — R42 Dizziness and giddiness: Secondary | ICD-10-CM | POA: Diagnosis not present

## 2022-02-03 DIAGNOSIS — N390 Urinary tract infection, site not specified: Secondary | ICD-10-CM | POA: Diagnosis not present

## 2022-02-03 DIAGNOSIS — Z7901 Long term (current) use of anticoagulants: Secondary | ICD-10-CM | POA: Diagnosis not present

## 2022-02-03 DIAGNOSIS — Z87891 Personal history of nicotine dependence: Secondary | ICD-10-CM | POA: Diagnosis not present

## 2022-02-03 DIAGNOSIS — R531 Weakness: Secondary | ICD-10-CM | POA: Diagnosis not present

## 2022-02-03 DIAGNOSIS — K5900 Constipation, unspecified: Secondary | ICD-10-CM

## 2022-02-03 DIAGNOSIS — N281 Cyst of kidney, acquired: Secondary | ICD-10-CM | POA: Diagnosis not present

## 2022-02-03 DIAGNOSIS — I4891 Unspecified atrial fibrillation: Secondary | ICD-10-CM | POA: Diagnosis not present

## 2022-02-03 DIAGNOSIS — R55 Syncope and collapse: Secondary | ICD-10-CM

## 2022-02-03 DIAGNOSIS — H9190 Unspecified hearing loss, unspecified ear: Secondary | ICD-10-CM | POA: Diagnosis not present

## 2022-02-03 DIAGNOSIS — I4819 Other persistent atrial fibrillation: Secondary | ICD-10-CM | POA: Diagnosis not present

## 2022-02-03 DIAGNOSIS — W19XXXA Unspecified fall, initial encounter: Secondary | ICD-10-CM | POA: Diagnosis not present

## 2022-02-03 DIAGNOSIS — J9811 Atelectasis: Secondary | ICD-10-CM | POA: Diagnosis not present

## 2022-02-03 DIAGNOSIS — K7460 Unspecified cirrhosis of liver: Secondary | ICD-10-CM | POA: Diagnosis not present

## 2022-02-03 DIAGNOSIS — N400 Enlarged prostate without lower urinary tract symptoms: Secondary | ICD-10-CM | POA: Diagnosis not present

## 2022-02-03 DIAGNOSIS — I890 Lymphedema, not elsewhere classified: Secondary | ICD-10-CM | POA: Diagnosis not present

## 2022-02-03 DIAGNOSIS — I129 Hypertensive chronic kidney disease with stage 1 through stage 4 chronic kidney disease, or unspecified chronic kidney disease: Secondary | ICD-10-CM | POA: Diagnosis not present

## 2022-02-03 DIAGNOSIS — I272 Pulmonary hypertension, unspecified: Secondary | ICD-10-CM | POA: Diagnosis not present

## 2022-02-03 DIAGNOSIS — E860 Dehydration: Secondary | ICD-10-CM | POA: Diagnosis not present

## 2022-02-03 DIAGNOSIS — Z1611 Resistance to penicillins: Secondary | ICD-10-CM | POA: Diagnosis not present

## 2022-02-03 LAB — COMPREHENSIVE METABOLIC PANEL
ALT: 37 U/L (ref 0–44)
AST: 99 U/L — ABNORMAL HIGH (ref 15–41)
Albumin: 3 g/dL — ABNORMAL LOW (ref 3.5–5.0)
Alkaline Phosphatase: 57 U/L (ref 38–126)
Anion gap: 9 (ref 5–15)
BUN: 18 mg/dL (ref 8–23)
CO2: 25 mmol/L (ref 22–32)
Calcium: 8.6 mg/dL — ABNORMAL LOW (ref 8.9–10.3)
Chloride: 104 mmol/L (ref 98–111)
Creatinine, Ser: 1.26 mg/dL — ABNORMAL HIGH (ref 0.61–1.24)
GFR, Estimated: 56 mL/min — ABNORMAL LOW (ref >60)
Glucose, Bld: 111 mg/dL — ABNORMAL HIGH (ref 70–99)
Potassium: 4 mmol/L (ref 3.5–5.1)
Sodium: 138 mmol/L (ref 135–145)
Total Bilirubin: 1.5 mg/dL — ABNORMAL HIGH (ref 0.3–1.2)
Total Protein: 6.1 g/dL — ABNORMAL LOW (ref 6.5–8.1)

## 2022-02-03 LAB — RESPIRATORY PANEL BY PCR

## 2022-02-03 LAB — PROTIME-INR
INR: 2.3 — ABNORMAL HIGH (ref 0.8–1.2)
Prothrombin Time: 25.1 seconds — ABNORMAL HIGH (ref 11.4–15.2)

## 2022-02-03 LAB — CK
Total CK: 1566 U/L — ABNORMAL HIGH (ref 49–397)
Total CK: 1529 U/L — ABNORMAL HIGH (ref 49–397)

## 2022-02-03 LAB — RAPID URINE DRUG SCREEN, HOSP PERFORMED
Amphetamines: NOT DETECTED
Barbiturates: NOT DETECTED
Benzodiazepines: NOT DETECTED
Cocaine: NOT DETECTED
Opiates: NOT DETECTED
Tetrahydrocannabinol: NOT DETECTED

## 2022-02-03 LAB — CBC
HCT: 43.1 % (ref 39.0–52.0)
Hemoglobin: 14.3 g/dL (ref 13.0–17.0)
MCH: 30 pg (ref 26.0–34.0)
MCHC: 33.2 g/dL (ref 30.0–36.0)
MCV: 90.5 fL (ref 80.0–100.0)
Platelets: 138 10*3/uL — ABNORMAL LOW (ref 150–400)
RBC: 4.76 MIL/uL (ref 4.22–5.81)
RDW: 13.4 % (ref 11.5–15.5)
WBC: 10.6 10*3/uL — ABNORMAL HIGH (ref 4.0–10.5)
nRBC: 0 % (ref 0.0–0.2)

## 2022-02-03 LAB — ECHOCARDIOGRAM COMPLETE
AR max vel: 0.95 cm2
AV Area VTI: 0.82 cm2
AV Area mean vel: 0.91 cm2
AV Mean grad: 34.5 mmHg
AV Peak grad: 50.3 mmHg
Ao pk vel: 3.55 m/s
Calc EF: 54.2 %
Height: 72 in
MV M vel: 5.15 m/s
MV Peak grad: 106.1 mmHg
Radius: 0.4 cm
S' Lateral: 3.9 cm
Single Plane A2C EF: 48.8 %
Single Plane A4C EF: 56.8 %

## 2022-02-03 LAB — PREALBUMIN: Prealbumin: 15.1 mg/dL — ABNORMAL LOW (ref 18–38)

## 2022-02-03 LAB — URINALYSIS, ROUTINE W REFLEX MICROSCOPIC
Bilirubin Urine: NEGATIVE
Glucose, UA: NEGATIVE mg/dL
Ketones, ur: NEGATIVE mg/dL
Nitrite: NEGATIVE
Protein, ur: 30 mg/dL — AB
Specific Gravity, Urine: <1.005 — ABNORMAL LOW (ref 1.005–1.030)
pH: 8 (ref 5.0–8.0)

## 2022-02-03 LAB — URINALYSIS, MICROSCOPIC (REFLEX): WBC, UA: >50 WBC/hpf (ref 0–5)

## 2022-02-03 LAB — TROPONIN I (HIGH SENSITIVITY)
Troponin I (High Sensitivity): 22 ng/L — ABNORMAL HIGH (ref <18)
Troponin I (High Sensitivity): 26 ng/L — ABNORMAL HIGH (ref <18)

## 2022-02-03 LAB — TSH: TSH: 1.86 u[IU]/mL (ref 0.350–4.500)

## 2022-02-03 MED ORDER — HYDROCODONE-ACETAMINOPHEN 5-325 MG PO TABS
1.0000 | ORAL_TABLET | ORAL | Status: DC | PRN
  Administered 2022-02-03: 1 via ORAL
  Filled 2022-02-03: qty 1

## 2022-02-03 MED ORDER — LORATADINE 10 MG PO TABS
10.0000 mg | ORAL_TABLET | Freq: Every day | ORAL | Status: DC
  Administered 2022-02-03 – 2022-02-05 (×3): 10 mg via ORAL
  Filled 2022-02-03 (×3): qty 1

## 2022-02-03 MED ORDER — WARFARIN SODIUM 5 MG PO TABS
5.0000 mg | ORAL_TABLET | Freq: Once | ORAL | Status: AC
Start: 2022-02-03 — End: 2022-02-03
  Administered 2022-02-03: 5 mg via ORAL
  Filled 2022-02-03: qty 1

## 2022-02-03 MED ORDER — ACETAMINOPHEN 325 MG PO TABS
650.0000 mg | ORAL_TABLET | ORAL | Status: DC | PRN
  Administered 2022-02-03 (×2): 650 mg via ORAL
  Filled 2022-02-03: qty 2

## 2022-02-03 MED ORDER — POLYVINYL ALCOHOL 1.4 % OP SOLN
2.0000 [drp] | OPHTHALMIC | Status: DC | PRN
  Filled 2022-02-03: qty 15

## 2022-02-03 MED ORDER — SENNA 8.6 MG PO TABS
1.0000 | ORAL_TABLET | Freq: Two times a day (BID) | ORAL | Status: DC
  Administered 2022-02-03: 8.6 mg via ORAL
  Filled 2022-02-03: qty 1

## 2022-02-03 MED ORDER — SODIUM CHLORIDE 0.9 % IV SOLN
75.0000 mL/h | INTRAVENOUS | Status: DC
  Administered 2022-02-03: 75 mL/h via INTRAVENOUS

## 2022-02-03 MED ORDER — ACETAMINOPHEN 650 MG RE SUPP: 650.0000 mg | RECTAL | Status: DC | PRN

## 2022-02-03 MED ORDER — AZITHROMYCIN 250 MG PO TABS: 500.0000 mg | ORAL_TABLET | Freq: Every day | ORAL | Status: DC

## 2022-02-03 MED ORDER — SODIUM CHLORIDE 0.9 % IV SOLN
500.0000 mg | INTRAVENOUS | Status: AC
  Administered 2022-02-03: 500 mg via INTRAVENOUS
  Filled 2022-02-03: qty 5

## 2022-02-03 MED ORDER — BUDESONIDE 0.5 MG/2ML IN SUSP
2.0000 mg | Freq: Two times a day (BID) | RESPIRATORY_TRACT | Status: DC
  Administered 2022-02-03: 2 mg via RESPIRATORY_TRACT
  Filled 2022-02-03: qty 8

## 2022-02-03 MED ORDER — FINASTERIDE 5 MG PO TABS
5.0000 mg | ORAL_TABLET | Freq: Every day | ORAL | Status: DC
  Administered 2022-02-03 – 2022-02-05 (×3): 5 mg via ORAL
  Filled 2022-02-03 (×3): qty 1

## 2022-02-03 MED ORDER — ADULT MULTIVITAMIN W/MINERALS CH
1.0000 | ORAL_TABLET | Freq: Every day | ORAL | Status: DC
  Administered 2022-02-03 – 2022-02-05 (×3): 1 via ORAL
  Filled 2022-02-03 (×3): qty 1

## 2022-02-03 MED ORDER — SODIUM CHLORIDE 0.9 % IV SOLN
1.0000 g | INTRAVENOUS | Status: DC
  Administered 2022-02-03 – 2022-02-05 (×3): 1 g via INTRAVENOUS
  Filled 2022-02-03 (×3): qty 10

## 2022-02-03 MED ORDER — SENNOSIDES-DOCUSATE SODIUM 8.6-50 MG PO TABS
2.0000 | ORAL_TABLET | Freq: Two times a day (BID) | ORAL | Status: DC
  Administered 2022-02-03 – 2022-02-05 (×4): 2 via ORAL
  Filled 2022-02-03 (×5): qty 2

## 2022-02-03 MED ORDER — SODIUM CHLORIDE 0.9% FLUSH
3.0000 mL | Freq: Two times a day (BID) | INTRAVENOUS | Status: DC
  Administered 2022-02-03: 3 mL via INTRAVENOUS

## 2022-02-03 MED ORDER — HALOPERIDOL LACTATE 5 MG/ML IJ SOLN: 2.0000 mg | Freq: Once | INTRAMUSCULAR | Status: DC

## 2022-02-03 MED ORDER — BISACODYL 10 MG RE SUPP: 10.0000 mg | Freq: Every day | RECTAL | Status: DC | PRN

## 2022-02-03 MED ORDER — IOHEXOL 300 MG/ML  SOLN
100.0000 mL | Freq: Once | INTRAMUSCULAR | Status: AC | PRN
  Administered 2022-02-03: 100 mL via INTRAVENOUS

## 2022-02-03 MED ORDER — IPRATROPIUM-ALBUTEROL 0.5-2.5 (3) MG/3ML IN SOLN
3.0000 mL | Freq: Four times a day (QID) | RESPIRATORY_TRACT | Status: DC
  Administered 2022-02-03 – 2022-02-04 (×8): 3 mL via RESPIRATORY_TRACT
  Filled 2022-02-03 (×8): qty 3

## 2022-02-03 MED ORDER — ACETAMINOPHEN 325 MG PO TABS
650.0000 mg | ORAL_TABLET | Freq: Four times a day (QID) | ORAL | Status: DC | PRN
  Administered 2022-02-03: 650 mg via ORAL
  Filled 2022-02-03 (×2): qty 2

## 2022-02-03 MED ORDER — ALBUMIN HUMAN 25 % IV SOLN
25.0000 g | Freq: Four times a day (QID) | INTRAVENOUS | Status: AC
  Administered 2022-02-03 – 2022-02-05 (×8): 25 g via INTRAVENOUS
  Filled 2022-02-03 (×8): qty 100

## 2022-02-03 MED ORDER — SODIUM CHLORIDE 0.9 % IV SOLN
75.0000 mL/h | INTRAVENOUS | Status: DC
  Administered 2022-02-03 – 2022-02-04 (×3): 75 mL/h via INTRAVENOUS

## 2022-02-03 MED ORDER — POLYETHYLENE GLYCOL 3350 17 G PO PACK: 17.0000 g | PACK | Freq: Every day | ORAL | Status: DC | PRN

## 2022-02-03 MED ORDER — ENSURE ENLIVE PO LIQD
237.0000 mL | Freq: Two times a day (BID) | ORAL | Status: DC
  Administered 2022-02-04: 237 mL via ORAL

## 2022-02-03 MED ORDER — ACETAMINOPHEN 650 MG RE SUPP: 650.0000 mg | Freq: Four times a day (QID) | RECTAL | Status: DC | PRN

## 2022-02-03 MED ORDER — POLYETHYLENE GLYCOL 3350 17 G PO PACK
17.0000 g | PACK | Freq: Every day | ORAL | Status: DC
Start: 2022-02-03 — End: 2022-02-05
  Administered 2022-02-03 – 2022-02-04 (×2): 17 g via ORAL
  Filled 2022-02-03 (×2): qty 1

## 2022-02-03 NOTE — Progress Notes (Signed)
ANTICOAGULATION CONSULT NOTE  ? ?Pharmacy Consult for warfarin ?Indication: atrial fibrillation ? ?No Known Allergies ? ?Patient Measurements: ?Weight: 100 kg  ? ?Vital Signs: ?Temp: 98.1 ?F (36.7 ?C) (04/27 0747) ?Temp Source: Oral (04/27 0747) ?BP: 105/55 (04/27 0747) ?Pulse Rate: 47 (04/27 0747) ? ?Labs: ?Recent Labs  ?  02/02/22 ?1551 02/02/22 ?1619 02/02/22 ?2234 02/03/22 ?0054 02/03/22 ?[id number] 02/03/22 ?[id number]  ?HGB 14.8 13.9  --   --  14.3  --   ?HCT 45.2 41.0  --   --  43.1  --   ?PLT 148*  --   --   --  138*  --   ?APTT 32  --   --   --   --   --   ?LABPROT 25.7*  --   --   --  25.1*  --   ?INR 2.4*  --   --   --  2.3*  --   ?CREATININE 1.29* 1.20  --   --  1.26*  --   ?CKTOTAL  --   --  [id number]*  --  1,566*  --   ?TROPONINIHS  --   --  22* 22*  --  26*  ? ? ? ?CrCl cannot be calculated (Unknown ideal weight.). ? ? ?Medical History: ?Past Medical History:  ?Diagnosis Date  ? Atrial fibrillation ([place])   ? Hearing loss   ? Hypertension   ? Prostate enlargement   ? Umbilical hernia [id number]  ? ? ?Medications:  ?Medications Prior to Admission  ?Medication Sig Dispense Refill Last Dose  ? acetaminophen (TYLENOL) 500 MG tablet Take 1,000 mg by mouth every 6 (six) hours as needed for moderate pain or headache.   02/01/2022  ? calcium-vitamin D (OSCAL WITH D) 500-5 MG-MCG tablet Take 1 tablet by mouth daily with breakfast.   02/02/2022  ? cetirizine (ZYRTEC) 10 MG tablet Take 1 tablet (10 mg total) by mouth daily. 90 tablet 1 02/02/2022  ? docusate sodium (COLACE) 100 MG capsule Take 100 mg by mouth at bedtime as needed for mild constipation.   Past Week  ? finasteride (PROSCAR) 5 MG tablet Take 1 tablet (5 mg total) by mouth daily. 90 tablet 1 02/02/2022  ? fish oil-omega-3 fatty acids 1000 MG capsule Take 1 g by mouth daily.   02/02/2022  ? furosemide (LASIX) 40 MG tablet Take 1 tablet (40 mg total) by mouth daily. 90 tablet 3 02/02/2022  ? melatonin 5 MG TABS Take 5-10 mg by mouth at bedtime as needed (sleep).   Past Week   ? metoprolol succinate (TOPROL-XL) 50 MG 24 hr tablet Take 1 tablet (50 mg total) by mouth daily. Take with or immediately following a meal. 90 tablet 1 02/02/2022 at 0800  ? Multiple Vitamins-Minerals (MULTIVITAMIN WITH MINERALS) tablet Take 1 tablet by mouth daily.   02/02/2022  ? Multiple Vitamins-Minerals (OCUVITE PO) Take 1 tablet by mouth daily.   02/02/2022  ? sodium chloride (OCEAN) 0.65 % SOLN nasal spray Place 1 spray into both nostrils as needed for congestion.   Past Week  ? traZODone (DESYREL) 50 MG tablet Take 0.5-1 tablets (25-50 mg total) by mouth at bedtime as needed for sleep. (Patient taking differently: Take 50 mg by mouth at bedtime.) 30 tablet 1 02/01/2022  ? warfarin (COUMADIN) 5 MG tablet TAKE 1 TO 2 TABLETS BY MOUTH ONCE DAILY AS  DIRECTED (Patient taking differently: Take 2.5-5 mg by mouth See admin instructions. 2.5 mg on Monday, Wednesday, Friday ?5 mg  on Tuesday,  Tuesday, Thursday,Saturday and sunday) 85 tablet 0 02/02/2022 at 080  ? ? ?Assessment: ?71 YOM who presented to the ED with acute onset of weakness and change in mental status concerning for TIA. Pt is on warfarin PTA for h/o AFib.  ?-INR= 2.3 ?  ? ?Home warfarin dose: 2.5 mg on Mon, Wed, Fri; 5 mg on all other days.  ? ?Goal of Therapy:  ?INR 2-3 ?Monitor platelets by anticoagulation protocol: Yes ?  ?Plan:  ?-Warfarin 5mg  today ?-Daily PT/INR ? ?Hildred Laser, PharmD ?Clinical Pharmacist ?**Pharmacist phone directory can now be found on [email or web address] ([email or web address]).  Listed under [place]. ? ? ? ?

## 2022-02-03 NOTE — Progress Notes (Signed)
?  Echocardiogram ?2D Echocardiogram has been performed. ? ?Augustine Radar ?02/03/2022, 11:43 AM ?

## 2022-02-03 NOTE — Assessment & Plan Note (Signed)
Chronic stable, pt with low albumin ?

## 2022-02-03 NOTE — Assessment & Plan Note (Signed)
-   Continue finasteride 

## 2022-02-03 NOTE — Assessment & Plan Note (Addendum)
-   Noted on imaging ?- Continue bowel regimen.  Achieved adequate bowel movements prior to discharge ?

## 2022-02-03 NOTE — Consult Note (Addendum)
?Cardiology Consultation:  ? ?Patient ID: Warren KnockSamuel W Hunke Jr. ?MRN: [id number]; DOB: 12/13/1934 ? ?Admit date: 02/02/2022 ?Date of Consult: 02/03/2022 ? ?PCP:  Mayer MaskerAbonza, Maritza, PA-C ?  ?[facility] Providers ?Cardiologist:  Rollene RotundaJames Hochrein, MD   { ? ?Patient Profile:  ? ?Warren KnockSamuel W Killion Jr. is a 87 y.o. male with a hx of persistent A fib on coumadin, HTN, BPH, COPD,  hard of hearing, chronic leg edema, who is being seen 02/03/2022 for the evaluation of near syncope at the request of Dr Frederick PeersGirguis. ? ?History of Present Illness:  ? ?Mr. Keelan with above PMH presented to ER with c/o near syncope and generalized weakness.  ? ?Patient follows Dr. Antoine PocheHochrein outpatient since 11/2019, had a long history of persistent atrial fibrillation, underwent cardioversion in 2008. He is on Coumadin for anticoagulation historically for CHA2DS2-VASc score of 2.  Due to leg swelling, Echo obtained on 11/21/2019 with normal LV systolic function, EF 55-60%, moderate LVH, no RWMA, normal RV, moderately dilated ascending aorta 46 mm, calcified aortic valve with moderate aortic stenosis mean gradient 18 mmHg, AVA 1 cm2, mild MR, severe LAE. Lower extremity no-invasive workup suggest venous insufficiency + lymphedema.  He was started on Lasix 40 mg daily. He was last seen on 05/26/20 by Dr Antoine PocheHochrein in the office and was doing well.  ? ?Patient presented to ER on 02/02/22 with generalized weakness and a near-fall event.He was on the ground for a while until EMS arrived. He is very sleepy upon exam today, daughter who lives with him offered history. Daughter states patient was more confused and not acting right over the past 2-3 days. He was very weak that he could not ambulate. He was trying to go to the bathroom yesterday, could not follow commands and was too weak to stand, daughter therefore lowered him to the ground. He never passed out, never complained any chest pain, SOB, dizziness, or lost consciousness/syncope. He is afraid of falling in  general, does not do much activity at baseline, need assistance going to the bathroom /shower. He sometimes has memory issues. He sleeps in chair due to preference, does not have any significant orthopnea, PND, weight gain. Chronic leg edema seems unchanged and doing well on daily Lasix.  ? ?Work up at admission showed Cr 1.29, GFR 55. BNP 94. WBC 10700. PLT 148k. D dimer WNL. INR 2.4.  ?Flu and COVID negative. CK 1747. Hs trop 22 >22 >26. UA with pyuria. CXR showed basilar subsegmental atelectasis or scarring, possible small bilateral pleural effusions. CTH showed no acute findings, chronic small vessel ischemic disease and brain atrophy, chronic left maxillary sinus inflammation, ? periapical abscess. CTAP showed no evidence of bowel obstruction, stable cirrhosis, CAD, chronic bladder outlet obstruction. Echo today showed severe aortic stenosis VTI 0.82 cm2, mean gradient 34.265mmHg, V-max 3.5675m/s.  LVEF 55 to 60%.  No RWMA.  Mild LVH.  Indeterminate diastolic parameter.  Mildly reduced RV.  Moderately elevated PASP with RVSP estimate 50.3 mmHg.  Severely dilated LA.  Mildly dilated RA.  Moderate MR.  Moderate to severe PR.  Mild dilatation of aortic root 40 mm and mild dilatation of ascending aorta 41 mm. ? ?He was admitted to hospital medicine for dizziness, encephalopathy, rhabdomyolysis, and presumed UTI. He was given IVF and antibiotic. Cardiology is consulted for dizziness and near-syncope given worsening AS on Echo.  ? ? ? ?Past Medical History:  ?Diagnosis Date  ? Atrial fibrillation (HCC)   ? Hearing loss   ? Hypertension   ?  Prostate enlargement   ? Umbilical hernia 07/27/2012  ? ? ?Past Surgical History:  ?Procedure Laterality Date  ? CARDIOVERSION  10/10/2006  ? cataracts Bilateral   ? HERNIA REPAIR  09/10/2012  ? large umbilical hernia  ? INSERTION OF MESH  09/10/2012  ? Procedure: INSERTION OF MESH;  Surgeon: [name], MD;  Location: [facility] OR;  Service: General;  Laterality: N/A;  ? macular  degeneration Bilateral   ? TONSILLECTOMY    ? "maybe" (09/10/2012)  ? UMBILICAL HERNIA REPAIR  09/10/2012  ? Procedure: HERNIA REPAIR UMBILICAL ADULT;  Surgeon: [name], MD;  Location: [facility] OR;  Service: General;  Laterality: N/A;  ?  ? ?Home Medications:  ?Prior to Admission medications   ?Medication Sig Start Date End Date Taking? Authorizing Provider  ?acetaminophen (TYLENOL) 500 MG tablet Take 1,000 mg by mouth every 6 (six) hours as needed for moderate pain or headache.   Yes [provider]  ?calcium-vitamin D (OSCAL WITH D) 500-5 MG-MCG tablet Take 1 tablet by mouth daily with breakfast.   Yes [provider]  ?cetirizine (ZYRTEC) 10 MG tablet Take 1 tablet (10 mg total) by mouth daily. 01/21/22  Yes Mayer Masker, PA-C  ?docusate sodium (COLACE) 100 MG capsule Take 100 mg by mouth at bedtime as needed for mild constipation.   Yes [provider]  ?finasteride (PROSCAR) 5 MG tablet Take 1 tablet (5 mg total) by mouth daily. 01/21/22  Yes Mayer Masker, PA-C  ?fish oil-omega-3 fatty acids 1000 MG capsule Take 1 g by mouth daily.   Yes [provider]  ?furosemide (LASIX) 40 MG tablet Take 1 tablet (40 mg total) by mouth daily. 06/17/21  Yes Juanda Crumble K, PA-C  ?melatonin 5 MG TABS Take 5-10 mg by mouth at bedtime as needed (sleep).   Yes [provider]  ?metoprolol succinate (TOPROL-XL) 50 MG 24 hr tablet Take 1 tablet (50 mg total) by mouth daily. Take with or immediately following a meal. 12/29/21  Yes Mayer Masker, PA-C  ?Multiple Vitamins-Minerals (MULTIVITAMIN WITH MINERALS) tablet Take 1 tablet by mouth daily.   Yes [provider]  ?Multiple Vitamins-Minerals (OCUVITE PO) Take 1 tablet by mouth daily.   Yes [provider]  ?sodium chloride (OCEAN) 0.65 % SOLN nasal spray Place 1 spray into both nostrils as needed for congestion.   Yes [provider]  ?traZODone (DESYREL) 50 MG tablet Take 0.5-1 tablets (25-50 mg  total) by mouth at bedtime as needed for sleep. ?Patient taking differently: Take 50 mg by mouth at bedtime. 12/29/21  Yes Abonza, Kandis Cocking, PA-C  ?warfarin (COUMADIN) 5 MG tablet TAKE 1 TO 2 TABLETS BY MOUTH ONCE DAILY AS  DIRECTED ?Patient taking differently: Take 2.5-5 mg by mouth See admin instructions. 2.5 mg on Monday, Wednesday, Friday ?5 mg  on Tuesday, Tuesday, Thursday,Saturday and sunday 11/30/21  Yes Lanier Prude, MD  ? ? ?Inpatient Medications: ?Scheduled Meds: ? [START ON 02/04/2022] feeding supplement  237 mL Oral BID BM  ? finasteride  5 mg Oral Daily  ? haloperidol lactate  2 mg Intramuscular Once  ? ipratropium-albuterol  3 mL Nebulization Q6H  ? loratadine  10 mg Oral Daily  ? multivitamin with minerals  1 tablet Oral Daily  ? polyethylene glycol  17 g Oral Daily  ? senna-docusate  2 tablet Oral BID  ? sodium chloride flush  3 mL Intravenous Q12H  ? warfarin  5 mg Oral ONCE-1600  ? Warfarin - Pharmacist Dosing  Inpatient   Does not apply q1600  ? ?Continuous Infusions: ? sodium chloride Stopped (02/03/22 0227)  ? sodium chloride    ? albumin human    ? cefTRIAXone (ROCEPHIN)  IV    ? ?PRN Meds: ?acetaminophen **OR** acetaminophen, albuterol, bisacodyl, HYDROcodone-acetaminophen, polyethylene glycol, polyvinyl alcohol ? ?Allergies:   No Known Allergies ? ?Social History:   ?Social History  ? ?Socioeconomic History  ? Marital status: Widowed  ?  Spouse name: Not on file  ? Number of children: Not on file  ? Years of education: Not on file  ? Highest education level: Not on file  ?Occupational History  ? Not on file  ?Tobacco Use  ? Smoking status: Former  ?  Packs/day: 1.50  ?  Years: 10.00  ?  Pack years: 15.00  ?  Types: Cigarettes  ?  Quit date: 07/27/1982  ?  Years since quitting: 39.5  ? Smokeless tobacco: Never  ?Vaping Use  ? Vaping Use: Never used  ?Substance and Sexual Activity  ? Alcohol use: No  ? Drug use: No  ? Sexual activity: Not Currently  ?  Birth control/protection: None  ?Other  Topics Concern  ? Not on file  ?Social History Narrative  ? Lives with 2 daughters. Three children.   ? ?Social Determinants of Health  ? ?Financial Resource Strain: Not on file  ?Food Insecurity: Not on file

## 2022-02-03 NOTE — Assessment & Plan Note (Addendum)
May have component of COPD exacerbation given wheezing and reactive airway disease patient has history of smoking per family he has been having some extra cough and extra wheezing lately.  Start albuterol scheduled DuoNeb  Inhaled steroids ?If not improving may need burst of steroids ? ?

## 2022-02-03 NOTE — Assessment & Plan Note (Addendum)
-   Home regimen resumed ?- Caution with any further presyncope or hypotension ?

## 2022-02-03 NOTE — Assessment & Plan Note (Addendum)
-   CXR negative for acute findings  ?

## 2022-02-03 NOTE — Consult Note (Signed)
? ?Palliative Care Consult Note  ?                                ?Date: 02/03/2022  ? ?Patient Name: Warren Wagner.  ?DOB: 12-Oct-1934  MRN: [id number]  Age / Sex: 87 y.o., male  ?PCP: Lorrene Reid, PA-C ?Referring Physician: Dwyane Dee, MD ? ?Reason for Consultation: Establishing goals of care "medical power of attorney paperwork, MOST form" ? ?HPI/Patient Profile: 87 y.o. male  with past medical history of atrial fibrillation on Coumadin, venous stasis, hypertension, lymphedema, and CKD stage II who presented to the emergency department on 02/02/2022 with near syncope.  He was walking to the bathroom, became weak, and lowered himself to the ground.  CT head negative for acute intracranial abnormalities; did show chronic small vessel ischemic disease and brain atrophy.  KUB showed moderate stool in the colon and mild diffuse increased small and large bowel gas consistent with questionable ileus.  CK was elevated at 1747.  Also noted to have low albumin of 3.1. ?Admitted to [facility] with postural dizziness with presyncope and rhabdomyolysis.  ? ? ?Past Medical History:  ?Diagnosis Date  ? Atrial fibrillation ([place])   ? Hearing loss   ? Hypertension   ? Prostate enlargement   ? Umbilical hernia 37/07/6268  ? ? ?Subjective:  ? ?I have reviewed medical records including EPIC notes, labs and imaging, and went to see the patient at bedside.  Bedside echocardiogram is in process. ? ?I met with his daughter [name] in the [place] room to discuss diagnosis, prognosis, [place], EOL wishes, disposition, and options. ? ?I introduced Palliative Medicine as specialized medical care for people living with serious illness. It focuses on providing relief from the symptoms and stress of a serious illness.  ? ?Created space and opportunity for patient and family to explore thoughts and feelings regarding current medical situation. ? ?Values and goals of care important to patient and family  were attempted to be elicited. ? ?Questions and concerns addressed. Patient/family encouraged to call with questions or concerns.   ? ? ?Life Review: ?Mr. Schmader is originally from [place], [place].  He received a 4-year degree from [employer] and worked as a English as a second language teacher.  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  He has been widowed since 2013.  He has 3 daughters; Tommi Rumps, and Claiborne Billings (lives in [place]). ? ?Functional Status: ?Mr. Dobkins lived independently in his home in [place] up until approximately 1 month ago.  He currently lives with his daughter Judson Roch and her home in [place].  [name] also lives with them.  They have sold his house in [place].  At baseline, he is ambulatory with a cane and is able to perform basic ADLs.  He typically refuses extra help when his daughters offer.  Denny Peon notices her father has recently been sleeping more during the day.  ? ?Goals: ?At this time goal is to continue current medical interventions with watchful waiting. Patient and family are agreeable to continue to gain answers through test/procedures and will make stepwise decisions based off of that information.   ? ?Today's Discussion: ?Clearence Ped understanding that her father has chronic atrial fib and that there is a concern for possible congestive heart failure. ? ?We discussed his current illness and what it means in the larger context of  her ongoing co-morbidities.  Provided education on the natural trajectory of chronic illness, emphasizing that it is non-curable and progressive. Discussed that chronic illness results in decreased functional status over time, as patients do not usually return to previous baseline after having an illness or exacerbation.  ? ?We discussed current recommendation from PT/OT is for rehab with goal of improving patient's functional status. ? ?The difference between aggressive  medical intervention and comfort care was considered in light of the patient's goals of care. We did discuss code status. Provided education on DNR/DNI status and explained evidenced based poor outcomes in similar hospitalized patients, as the cause of the arrest is likely associated with chronic/terminal disease rather than a reversible acute cardio-pulmonary event. [name] seems to indicate that she agrees that DNR is appropriate; however this will need to be discussed with her father and her sister. ? ?A discussion was had today regarding advanced directives. Concepts specific to code status, artifical feeding and hydration, continued IV antibiotics and rehospitalization was had.  The MOST form was introduced and discussed. I also introduced the advanced directives packet and offered to assist patient with completing this during hospitalization.  ? ? ?Review of Systems  ?Constitutional:  Positive for activity change.  ? ?Objective:  ? ?Primary Diagnoses: ?Present on Admission: ? Atrial fibrillation ([place]) ? Essential hypertension ? Benign prostatic hyperplasia ? Rhabdomyolysis ? Bilateral leg edema ? COPD with acute exacerbation ([place]) ? Ileus ([place]) ? ? ?Physical Exam ?Vitals reviewed.  ?Constitutional:   ?   General: He is not in acute distress. ?Cardiovascular:  ?   Rate and Rhythm: Normal rate. Rhythm irregularly irregular.  ?   Comments: A-fib ?Pulmonary:  ?   Effort: Pulmonary effort is normal.  ?Neurological:  ?   Mental Status: He is alert and oriented to person, place, and time.  ?   Motor: Weakness present.  ? ? ?Vital Signs:  ?BP (!) 105/55 (BP Location: Left Arm)   Pulse (!) 47   Temp 98.1 ?F (36.7 ?C) (Oral)   Resp (!) 22   Ht 6' (1.829 m)   SpO2 95%   BMI 29.84 kg/m?  ? ?Palliative Assessment/Data: PPS 40% ? ? ? ? ?Assessment & Plan:  ? ?SUMMARY OF RECOMMENDATIONS   ?Full code full scope with ongoing discussion ?Continue all current interventions and work-up ?Follow-up meeting with patient and  daughters tomorrow at 46 am ? ?Primary Decision Maker: ?Patient currently makes his own decisions ?There is no HCPOA on file - have offered to assist with completion of HCPOA document during hospitalization ? ?Prognosis:  ?Unable to determine ? ?Discharge Planning:  ?SNF for rehab ? ? ? ?Thank you for allowing [place] to participate in the care of [name] ? ?MDM - [place] ? ? ?Signed by: ?Elie Confer, NP ?Palliative Medicine Team ? ?Team Phone # [phone]  ?For individual providers, please see AMION ? ? ? ? ? ? ? ? ? ? ? ? ? ? ? ?

## 2022-02-03 NOTE — Progress Notes (Signed)
Initial Nutrition Assessment ? ?DOCUMENTATION CODES:  ?Not applicable ? ?INTERVENTION:  ?Continue current diet as ordered ?Ensure Enlive po BID, each supplement provides 350 kcal and 20 grams of protein. ?MVI with minerals daily ?Request new measured weight ? ?NUTRITION DIAGNOSIS:  ?Inadequate oral intake related to decreased appetite as evidenced by per patient/family report. ? ?GOAL:  ?Patient will meet greater than or equal to 90% of their needs ? ?MONITOR:  ?PO intake, Supplement acceptance, Weight trends, Labs ? ?REASON FOR ASSESSMENT:  ?Consult ?Assessment of nutrition requirement/status ? ?ASSESSMENT:  ?87 y.o. male with hx of atrial fibrillation, CKD2, COPD, lymphedema, and HTN presented to ED from home with weakness and confusion.   ? ?Pt resting in bed at the time of assessment, NT about to perform pt care. Daughters at bedside assist with nutrition hx. Pt awake, but sometimes slow to respond to questions or did not answer at all.  ? ?Daughters report that appetite has been decreased from baseline since being admitted yesterday. State that normally, pt eats well and they have no concerns with intake. Pt denies any recent weight loss.  ? ?Will add nutrition supplement to augment intake until appetite returns to baseline.    ? ?Average Meal Intake: ?No intake recorded at this time ? ?Nutritionally Relevant Medications: ?Scheduled Meds: ? senna  1 tablet Oral BID  ? warfarin  5 mg Oral ONCE-1600  ? ?Continuous Infusions: ? sodium chloride 100 mL/hr (02/03/22 0227)  ? ?PRN Meds: bisacodyl, polyethylene glycol ? ?Labs Reviewed: ?Creatinine 1.26 ? ?NUTRITION - FOCUSED PHYSICAL EXAM: ?Flowsheet Row Most Recent Value  ?Orbital Region No depletion  ?Upper Arm Region No depletion  ?Thoracic and Lumbar Region No depletion  ?Buccal Region No depletion  ?Temple Region No depletion  ?Clavicle Bone Region No depletion  ?Clavicle and Acromion Bone Region No depletion  ?Scapular Bone Region No depletion  ?Dorsal Hand No  depletion  ?Patellar Region No depletion  ?Anterior Thigh Region No depletion  ?Posterior Calf Region No depletion  ?Edema (RD Assessment) Mild  [BLE]  ?Hair Reviewed  ?Eyes Reviewed  ?Mouth Reviewed  ?Skin Reviewed  ?Nails Reviewed  ? ?Diet Order:   ?Diet Order   ? ?       ?  Diet regular Room service appropriate? Yes; Fluid consistency: Thin  Diet effective now       ?  ? ?  ?  ? ?  ? ? ?EDUCATION NEEDS:  ?Not appropriate for education at this time ? ?Skin:  Skin Assessment: Reviewed RN Assessment ? ?Last BM:  prior to admission ? ?Height:  ?Ht Readings from Last 1 Encounters:  ?02/03/22 6' (1.829 m)  ? ? ?Weight:  ?Wt Readings from Last 1 Encounters:  ?12/29/21 99.8 kg  ? ? ?Ideal Body Weight:  80.9 kg ? ?BMI:  Body mass index is 29.84 kg/m?. ? ?Estimated Nutritional Needs:  ?Kcal:  2200-2400 kcal/d ?Protein:  110-120 g/d ?Fluid:  2.2-2.4L/d ? ? ?Greig Castilla, RD, LDN ?Clinical Dietitian ?RD pager # available in AMION  ?After hours/weekend pager # available in AMION ?

## 2022-02-03 NOTE — Assessment & Plan Note (Addendum)
Possible ileus, on KUB, pt with decreased BM will obtain CT to further evaluate, UA pending ?Keep NPO for now ?

## 2022-02-03 NOTE — Assessment & Plan Note (Addendum)
-   initial CK 1747 ?-Down trended to 567 at discharge ?-Responded well to fluids ?

## 2022-02-03 NOTE — ED Notes (Signed)
Patient transported to CT 

## 2022-02-03 NOTE — Assessment & Plan Note (Addendum)
-   echo does show some worsening AS and PV regurg ?-Treated with albumin and IV fluids during hospitalization ?

## 2022-02-03 NOTE — Progress Notes (Signed)
?Progress Note ? ? ? ?Warren Wagner.   ?DT:[id number]  ?DOB: 07-28-1935  ?DOA: 02/02/2022     0 ?PCP: Warren Reid, PA-C ? ?Initial CC: weakness, presyncope, confusion ? ?Hospital Course: ?Mr. Warren Wagner is an 87 yo male with PMH afib, HTN, BPH, hard of hearing who presented to the hospital with worsening weakness and confusion per family.  ?Further collateral information after admission was obtained and patient was noted to have sat in the car on Tuesday for approximately 2 hours as he did not wish to get out for an event that family was going to. The windows were open but he did appear to have sweat some during this.  Throughout the rest of the week he became progressively more weak and confused. ?Work-up on admission revealed elevated CK, 1747; urinalysis was suggestive of UTI. ?Abdominal x-ray was concerning for possible ileus or obstruction.  CT abdomen/pelvis ruled out any underlying obstruction and showed moderate stool burden. ?CT head is unremarkable for acute findings but revealed underlying chronic small vessel disease and brain atrophy.  Questionable periapical lucency involving left upper tooth which could not rule out periapical abscess. ?He was started on antibiotics, fluids, and admitted for further work-up and monitoring. ? ?Interval History:  ?Daughter present bedside this morning and provided further information that he had chose to sit in the car on Tuesday at a family function they were at.  The window was open but he was still noted to be hot and sweaty.  He had sat in the car for about 2 hours.  ?This morning he remains overtly confused and had difficulty following some commands with delayed mentation.  Speech was slowed and he had difficulty answering questions. ? 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 ?Acute metabolic encephalopathy ?- Likely multifactorial in setting of underlying presumed UTI as well as probable dehydration ?- Continue fluids ?- Continue Rocephin and follow-up urine culture ? ?Rhabdomyolysis ?- initial CK 1747 ?-Etiology appears to be possibly from sitting prolonged amount of time in the car while in the heat ?- CK is slowly downtrending; not high enough to cause renal failure ?- continue IVF ?- CK daily  ? ?Essential hypertension ?- Controlled on admission with some low/normal values ?- Hold antihypertensives for now ? ?Bilateral leg edema ?- echo does show some worsening AS and PV regurg ?- start albumin given hypoalb while giving IVF in setting of rhabdo and AMS ? ?Atrial fibrillation ([place]) ?- Rate currently controlled ?- On Coumadin at home.  Continue Coumadin per pharmacy ? ?Benign prostatic hyperplasia ?- Continue finasteride ? ?Constipation ?- Noted on imaging ?- Continue bowel regimen ? ?COPD (chronic obstructive pulmonary disease) ([place]) ?- CXR negative for acute findings  ?- continue duonebs ? ? ?Old records reviewed in assessment of this patient ? ?Antimicrobials: ?Azithromycin 02/03/22 x 1 ?Rocephin 4/27 >> current ? ?DVT prophylaxis:  ? ?warfarin (COUMADIN) tablet 5 mg  ? ?Code Status:   Code Status: Full Code ? ?Disposition Plan:  pending clinical course ?Status is: Obs ? ?Objective: ?Blood pressure (!) 139/124, pulse 93, temperature 99.1 ?F (37.3 ?C), temperature source Oral, resp. rate 18, height 6' (1.829 m), SpO2 96 %.  ?Examination:  ?Physical Exam ?Constitutional:   ?   Comments: Elderly gentleman  lying in bed grossly confused and follows some commands but with coaching  ?HENT:  ?   Head: Normocephalic and atraumatic.  ?   Mouth/Throat:  ?   Mouth: Mucous membranes are dry.  ?Eyes:  ?   Extraocular Movements: Extraocular movements intact.  ?Cardiovascular:  ?    Rate and Rhythm: Normal rate and regular rhythm.  ?   Heart sounds: Normal heart sounds.  ?Pulmonary:  ?   Effort: Pulmonary effort is normal. No respiratory distress.  ?   Breath sounds: Normal breath sounds.  ?Abdominal:  ?   General: Bowel sounds are normal. There is no distension.  ?   Palpations: Abdomen is soft.  ?   Tenderness: There is abdominal tenderness.  ?   Comments: Nonspecific TTP, no R/G  ?Musculoskeletal:     ?   General: Normal range of motion.  ?   Cervical back: Normal range of motion and neck supple.  ?Skin: ?   General: Skin is warm and dry.  ?Neurological:  ?   Mental Status: He is disoriented.  ?   Comments: Moves all 4 extremities and follows commands with difficulty  ?  ? ?Consultants:  ?Cardiology ? ?Procedures:  ? ? ?Data Reviewed: ?Results for orders placed or performed during the hospital encounter of 02/02/22 (from the past 24 hour(s))  ?CBG monitoring, ED     Status: Abnormal  ? Collection Time: 02/02/22  3:46 PM  ?Result Value Ref Range  ? Glucose-Capillary 114 (H) 70 - 99 mg/dL  ?Protime-INR     Status: Abnormal  ? Collection Time: 02/02/22  3:51 PM  ?Result Value Ref Range  ? Prothrombin Time 25.7 (H) 11.4 - 15.2 seconds  ? INR 2.4 (H) 0.8 - 1.2  ?APTT     Status: None  ? Collection Time: 02/02/22  3:51 PM  ?Result Value Ref Range  ? aPTT 32 24 - 36 seconds  ?CBC     Status: Abnormal  ? Collection Time: 02/02/22  3:51 PM  ?Result Value Ref Range  ? WBC 10.7 (H) 4.0 - 10.5 K/uL  ? RBC 4.99 4.22 - 5.81 MIL/uL  ? Hemoglobin 14.8 13.0 - 17.0 g/dL  ? HCT 45.2 39.0 - 52.0 %  ? MCV 90.6 80.0 - 100.0 fL  ? MCH 29.7 26.0 - 34.0 pg  ? MCHC 32.7 30.0 - 36.0 g/dL  ? RDW 13.2 11.5 - 15.5 %  ? Platelets 148 (L) 150 - 400 K/uL  ? nRBC 0.0 0.0 - 0.2 %  ?Differential     Status: Abnormal  ? Collection Time: 02/02/22  3:51 PM  ?Result Value Ref Range  ? Neutrophils Relative % 78 %  ? Neutro Abs 8.5 (H) 1.7 - 7.7 K/uL  ? Lymphocytes Relative 8 %  ? Lymphs Abs 0.9 0.7 - 4.0 K/uL  ? Monocytes Relative  11 %  ? Monocytes Absolute 1.2 (H) 0.1 - 1.0 K/uL  ? Eosinophils Relative 1 %  ? Eosinophils Absolute 0.1 0.0 - 0.5 K/uL  ? Basophils Relative 1 %  ? Basophils Absolute 0.1 0.0 - 0.1 K/uL  ? Immature Granulocytes 1 %  ? Abs Immature Granulocytes 0.05 0.00 - 0.07 K/uL  ?Comprehensive metabolic panel     Status: Abnormal  ? Collection Time: 02/02/22  3:51 PM  ?Result Value Ref Range  ? Sodium 138 135 - 145 mmol/L  ? Potassium 3.7 3.5 - 5.1 mmol/L  ? Chloride 104 98 - 111 mmol/L  ? CO2 25 22 -  32 mmol/L  ? Glucose, Bld 113 (H) 70 - 99 mg/dL  ? BUN 19 8 - 23 mg/dL  ? Creatinine, Ser 1.29 (H) 0.61 - 1.24 mg/dL  ? Calcium 8.9 8.9 - 10.3 mg/dL  ? Total Protein 6.3 (L) 6.5 - 8.1 g/dL  ? Albumin 3.1 (L) 3.5 - 5.0 g/dL  ? AST 124 (H) 15 - 41 U/L  ? ALT 39 0 - 44 U/L  ? Alkaline Phosphatase 59 38 - 126 U/L  ? Total Bilirubin 1.2 0.3 - 1.2 mg/dL  ? GFR, Estimated 55 (L) >60 mL/min  ? Anion gap 9 5 - 15  ?Brain natriuretic peptide     Status: None  ? Collection Time: 02/02/22  3:51 PM  ?Result Value Ref Range  ? B Natriuretic Peptide 94.6 0.0 - 100.0 pg/mL  ?D-dimer, quantitative     Status: None  ? Collection Time: 02/02/22  3:51 PM  ?Result Value Ref Range  ? D-Dimer, Quant 0.35 0.00 - 0.50 ug/mL-FEU  ?Respiratory (~20 pathogens) panel by PCR     Status: None  ? Collection Time: 02/02/22  4:00 PM  ? Specimen: Nasopharyngeal Swab; Respiratory  ?Result Value Ref Range  ? Adenovirus NOT DETECTED NOT DETECTED  ? Coronavirus 229E NOT DETECTED NOT DETECTED  ? Coronavirus HKU1 NOT DETECTED NOT DETECTED  ? Coronavirus NL63 NOT DETECTED NOT DETECTED  ? Coronavirus OC43 NOT DETECTED NOT DETECTED  ? Metapneumovirus NOT DETECTED NOT DETECTED  ? Rhinovirus / Enterovirus NOT DETECTED NOT DETECTED  ? Influenza A NOT DETECTED NOT DETECTED  ? Influenza B NOT DETECTED NOT DETECTED  ? Parainfluenza Virus 1 NOT DETECTED NOT DETECTED  ? Parainfluenza Virus 2 NOT DETECTED NOT DETECTED  ? Parainfluenza Virus 3 NOT DETECTED NOT DETECTED  ?  Parainfluenza Virus 4 NOT DETECTED NOT DETECTED  ? Respiratory Syncytial Virus NOT DETECTED NOT DETECTED  ? Bordetella pertussis NOT DETECTED NOT DETECTED  ? Bordetella Parapertussis NOT DETECTED NOT DETECTED  ? Chla

## 2022-02-03 NOTE — Assessment & Plan Note (Addendum)
-   etiology possibly from dehydration/rhabdo vs orthostasis vs cardiogenic ?- follow up echo (does shows severe AS, pulmonic valve regurg is mod/severe; findings appear progressed from 2021 echo) ?-Evaluated by cardiology inpatient, greatly appreciate assistance.  Not considered candidate for any intervention currently during this hospitalization and recommended for outpatient follow-up for repeat echo in about 6 months and follow-up with cardiology ?

## 2022-02-03 NOTE — Evaluation (Signed)
Clinical/Bedside Swallow Evaluation ?Patient Details  ?Name: Rolen Conger. ?MRN: [id number] ?Date of Birth: 08-14-1935 ? ?Today's Date: 02/03/2022 ?Time: SLP Start Time (ACUTE ONLY): 1000 SLP Stop Time (ACUTE ONLY): 1020 ?SLP Time Calculation (min) (ACUTE ONLY): 20 min ? ?Past Medical History:  ?Past Medical History:  ?Diagnosis Date  ? Atrial fibrillation ([facility])   ? Hearing loss   ? Hypertension   ? Prostate enlargement   ? Umbilical hernia 07/27/2012  ? ?Past Surgical History:  ?Past Surgical History:  ?Procedure Laterality Date  ? CARDIOVERSION  10/10/2006  ? cataracts Bilateral   ? HERNIA REPAIR  09/10/2012  ? large umbilical hernia  ? INSERTION OF MESH  09/10/2012  ? Procedure: INSERTION OF MESH;  Surgeon: [name], MD;  Location[facility] OR;  Service: General;  Laterality: N/A;  ? macular degeneration Bilateral   ? TONSILLECTOMY    ? "maybe" (09/10/2012)  ? UMBILICAL HERNIA REPAIR  09/10/2012  ? Procedure: HERNIA REPAIR UMBILICAL ADULT;  Surgeon: [name], MD;  Location: [facility] OR;  Service: General;  Laterality: N/A;  ? ?HPI:  ?Patient is an 87 y.o. male with PMH: lymphedema, HTN, afib on Coumadin, venous statis, who was admitted to the hospital on 4/26 with presyncope after having a fall at home during which he lowered himself to the ground. Per daughters he was fatigued, progressively getting worse and seemed like he was about to pass out, prompting them to call 911. CXR showed small bilateral pleural effusions, CT head did not show any acute intracranial abnormalities. He lives with his two daughters at baseline and home alone at times for a few hours.  ?  ?Assessment / Plan / Recommendation  ?Clinical Impression ? Patient is not currently presenting with clinical s/s of dysphagia as per this bedside/ clinical swallow evaluation. Swallow initiation was timely and no overt s/s aspiration, penetration or change in vitals observed when patient drinking straw sips of water. Patient and daughter both  deny any h/o or current difficulties with swallowing and although patient does have coughing at times, this does not appear to be related to eating or drinking. Patient's oxygen saturations remained in mid-90% and RR remained in range of 20-22. Voice was clear and no change in vocal quality. SLP is not recommending f/u intervention at this time but please reorder if patient exhibiting s/s concerning for aspiration/penetration or other difficulties related to swallowing. ?SLP Visit Diagnosis: Dysphagia, unspecified (R13.10) ?   ?Aspiration Risk ? No limitations  ?  ?Diet Recommendation Regular;Thin liquid  ? ?Medication Administration: Whole meds with liquid ?Supervision: Patient able to self feed ?Compensations: Slow rate;Small sips/bites ?Postural Changes: Seated upright at 90 degrees  ?  ?Other  Recommendations Oral Care Recommendations: Oral care BID   ? ?Recommendations for follow up therapy are one component of a multi-disciplinary discharge planning process, led by the attending physician.  Recommendations may be updated based on patient status, additional functional criteria and insurance authorization. ? ?Follow up Recommendations No SLP follow up  ? ? ?  ?Assistance Recommended at Discharge None  ?Functional Status Assessment Patient has had a recent decline in their functional status and demonstrates the ability to make significant improvements in function in a reasonable and predictable amount of time.  ?Frequency and Duration   N/A ?  ?  ?   ? ?Prognosis   N/A ? ?  ? ?Swallow Study   ?General Date of Onset: 02/03/22 ?HPI: Patient is an 87 y.o. male with PMH: lymphedema,  HTN, afib on Coumadin, venous statis, who was admitted to the hospital on 4/26 with presyncope after having a fall at home during which he lowered himself to the ground. Per daughters he was fatigued, progressively getting worse and seemed like he was about to pass out, prompting them to call 911. CXR showed small bilateral pleural  effusions, CT head did not show any acute intracranial abnormalities. He lives with his two daughters at baseline and home alone at times for a few hours. ?Type of Study: Bedside Swallow Evaluation ?Previous Swallow Assessment: none found ?Diet Prior to this Study: Regular;Thin liquids ?Temperature Spikes Noted: No ?Respiratory Status: Room air ?History of Recent Intubation: No ?Behavior/Cognition: Alert;Cooperative;Impulsive ?Oral Cavity Assessment: Within Functional Limits ?Oral Care Completed by SLP: No ?Oral Cavity - Dentition: Adequate natural dentition ?Vision: Functional for self-feeding ?Self-Feeding Abilities: Able to feed self ?Patient Positioning: Upright in chair ?Baseline Vocal Quality: Normal ?Volitional Cough: Strong ?Volitional Swallow: Able to elicit  ?  ?Oral/Motor/Sensory Function Overall Oral Motor/Sensory Function: Within functional limits   ?Ice Chips     ?Thin Liquid Thin Liquid: Within functional limits ?Presentation: Straw;Self Fed  ?  ?Nectar Thick     ?Honey Thick     ?Puree Puree: Not tested   ?Solid ? ? ?  Solid: Not tested  ? ?  ? ?Angela Nevin, MA, CCC-SLP ?Speech Therapy ? ? ? ? ?

## 2022-02-03 NOTE — Evaluation (Signed)
Occupational Therapy Evaluation ?Patient Details ?Name: Warren Wagner. ?MRN: [id number] ?DOB: [birth year] ?Today's Date: 02/03/2022 ? ? ?History of Present Illness 87 yo admitted 4/26 with presyncope and lowered himself to the ground at home. PMhx: lymphedema, HTN, Afib  ? ?Clinical Impression ?  ?Pt typically walks with a cane, sits to shower and can perform basic ADLs independently. He is assisted for IADLs. Pt has memory deficits at baseline. Pt feeling poorly at time of evaluation, with fever of 103 earlier. Limited evaluation. Pt, overall, requires set up to total assist for ADL and demonstrates very slow processing. Recommending SNF as pt does not have 24 hour supervision at home. Will follow acutely.  ?   ? ?Recommendations for follow up therapy are one component of a multi-disciplinary discharge planning process, led by the attending physician.  Recommendations may be updated based on patient status, additional functional criteria and insurance authorization.  ? ?Follow Up Recommendations ? Skilled nursing-short term rehab (<3 hours/day)  ?  ?Assistance Recommended at Discharge Frequent or constant Supervision/Assistance  ?Patient can return home with the following A lot of help with bathing/dressing/bathroom;Assistance with cooking/housework;Direct supervision/assist for medications management;Direct supervision/assist for financial management;Assist for transportation;Help with stairs or ramp for entrance;A little help with walking and/or transfers ? ?  ?Functional Status Assessment ? Patient has had a recent decline in their functional status and demonstrates the ability to make significant improvements in function in a reasonable and predictable amount of time.  ?Equipment Recommendations ? None recommended by OT  ?  ?Recommendations for Other Services   ? ? ?  ?Precautions / Restrictions Precautions ?Precautions: Fall ?Precaution Comments: macular degeneration  ? ?  ? ?Mobility Bed Mobility ?Overal  bed mobility: Needs Assistance ?Bed Mobility: Supine to Sit, Sit to Supine ?  ?  ?Supine to sit: HOB elevated ?Sit to supine: Min assist ?  ?General bed mobility comments: HOB up, multimodal cues to initiate ?  ? ?Transfers ?  ?  ?  ?  ?  ?  ?  ?  ?  ?General transfer comment: deferred, pt with 103 fever, feeling weak ?  ? ?  ?Balance Overall balance assessment: Needs assistance ?  ?Sitting balance-Leahy Scale: Fair ?  ?  ?  ?  ?  ?  ?  ?  ?  ?  ?  ?  ?  ?  ?  ?  ?   ? ?ADL either performed or assessed with clinical judgement  ? ?ADL Overall ADL's : Needs assistance/impaired ?Eating/Feeding: Set up;Sitting ?  ?Grooming: Set up;Sitting;Wash/dry hands ?  ?Upper Body Bathing: Moderate assistance;Sitting ?  ?Lower Body Bathing: Total assistance;Bed level ?  ?Upper Body Dressing : Moderate assistance;Sitting ?  ?Lower Body Dressing: Total assistance;Bed level ?  ?  ?  ?  ?  ?  ?  ?  ?   ? ? ? ?Vision Baseline Vision/History: 6 Macular Degeneration ?Ability to See in Adequate Light: 3 Highly impaired ?Patient Visual Report: Central vision impairment ?   ?   ?Perception   ?  ?Praxis   ?  ? ?Pertinent Vitals/Pain Pain Assessment ?Pain Assessment: No/denies pain  ? ? ? ?Hand Dominance Left ?  ?Extremity/Trunk Assessment Upper Extremity Assessment ?Upper Extremity Assessment: Generalized weakness ?  ?Lower Extremity Assessment ?Lower Extremity Assessment: Defer to PT evaluation ?  ?Cervical / Trunk Assessment ?Cervical / Trunk Assessment: Kyphotic ?  ?Communication Communication ?Communication: HOH ?  ?Cognition Arousal/Alertness: Awake/alert ?Behavior During Therapy: Flat affect ?Overall Cognitive  Status: Impaired/Different from baseline ?Area of Impairment: Following commands, Safety/judgement, Problem solving ?  ?  ?  ?  ?  ?  ?  ?  ?  ?  ?  ?Following Commands: Follows one step commands with increased time, Follows one step commands inconsistently ?Safety/Judgement: Decreased awareness of safety, Decreased awareness of  deficits ?  ?Problem Solving: Slow processing ?General Comments: daughter providing ADL information, pt with up to 10 second delay when answering questions ?  ?  ?General Comments    ? ?  ?Exercises   ?  ?Shoulder Instructions    ? ? ?Home Living Family/patient expects to be discharged to:: Private residence ?Living Arrangements: Children (2 daughters and a grandson) ?Available Help at Discharge: Family;Available PRN/intermittently ?Type of Home: House ?Home Access: Stairs to enter ?Entrance Stairs-Number of Steps: 3 ?  ?Home Layout: Two level;Able to live on main level with bedroom/bathroom ?  ?  ?Bathroom Shower/Tub: Walk-in shower ?  ?Bathroom Toilet: Handicapped height ?  ?  ?Home Equipment: Rollator (4 wheels);Cane - single point;Shower seat ?  ?Additional Comments: sleeps on the couch ?  ? ?  ?Prior Functioning/Environment Prior Level of Function : Needs assist ?  ?  ?  ?  ?  ?  ?Mobility Comments: cane in house, rollator in the community ?ADLs Comments: bathes 1x/wk on shower chair, usually wears briefs and manages toileting and dressing on his own. Family does medication management and homemaking ?  ? ?  ?  ?OT Problem List: Decreased strength;Decreased activity tolerance;Decreased knowledge of use of DME or AE;Obesity;Decreased cognition ?  ?   ?OT Treatment/Interventions: Self-care/ADL training;DME and/or AE instruction;Therapeutic activities;Patient/family education;Balance training  ?  ?OT Goals(Current goals can be found in the care plan section) Acute Rehab OT Goals ?OT Goal Formulation: With family ?Time For Goal Achievement: 02/17/22 ?Potential to Achieve Goals: Good ?ADL Goals ?Pt Will Perform Grooming: with supervision;standing ?Pt Will Perform Upper Body Dressing: with supervision;sitting ?Pt Will Perform Lower Body Dressing: with min assist;sit to/from stand ?Pt Will Transfer to Toilet: with supervision;ambulating;bedside commode ?Pt Will Perform Toileting - Clothing Manipulation and hygiene:  with min assist;sit to/from stand ?Additional ADL Goal #1: Pt will perform bed mobility with supervision in preparation for ADLs.  ?OT Frequency: Min 2X/week ?  ? ?Co-evaluation   ?  ?  ?  ?  ? ?  ?AM-PAC OT "6 Clicks" Daily Activity     ?Outcome Measure Help from another person eating meals?: A Little ?Help from another person taking care of personal grooming?: A Little ?Help from another person toileting, which includes using toliet, bedpan, or urinal?: A Lot ?Help from another person bathing (including washing, rinsing, drying)?: A Lot ?Help from another person to put on and taking off regular upper body clothing?: A Little ?Help from another person to put on and taking off regular lower body clothing?: Total ?6 Click Score: 14 ?  ?End of Session   ? ?Activity Tolerance: Treatment limited secondary to medical complications (Comment) (pt with fever) ?Patient left: in bed;with call bell/phone within reach;with bed alarm set;with nursing/sitter in room;with family/visitor present ? ?OT Visit Diagnosis: Muscle weakness (generalized) (M62.81);Other symptoms and signs involving cognitive function  ?              ?Time: 4163-8453 ?OT Time Calculation (min): 19 min ?Charges:  OT General Charges ?$OT Visit: 1 Visit ?OT Evaluation ?$OT Eval Moderate Complexity: 1 Mod ? ?Martie Round, OTR/L ?Acute Rehabilitation Services ?Pager: [phone] ?Office: [phone]  ? ?  Evern BioMayberry, Kasem Mozer Lynn ?02/03/2022, 4:35 PM ?

## 2022-02-03 NOTE — Evaluation (Addendum)
Physical Therapy Evaluation ?Patient Details ?Name: Warren Wagner. ?MRN: [id number] ?DOB: [birth year] ?Today's Date: 02/03/2022 ? ?History of Present Illness ? 87 yo admitted 4/26 with presyncope and lowered himself to the ground at home. PMhx: lymphedema, HTN, Afib  ?Clinical Impression ? Pt with flat affect, delayed responses and impaired cognition and mobility. Pt lives with daughters and at times is home a few hours alone. Pt normally able to walk and perform iADLs but currently requiring assist to stand and pivot, unable to walk but could not state why he would not attempt. Pt with decreased cognition, strength, balance and function who will benefit from acute therapy to maximize safety and independence.    ? ?Orthostatic BPs ? ?Supine 111/60  ?Sitting 108/54  ?   ?Standing 122/78  ?Standing after 3 min 148/52  ? ? ?   ? ?Recommendations for follow up therapy are one component of a multi-disciplinary discharge planning process, led by the attending physician.  Recommendations may be updated based on patient status, additional functional criteria and insurance authorization. ? ?Follow Up Recommendations Skilled nursing-short term rehab (<3 hours/day) if able to walk may return home ? ?  ?Assistance Recommended at Discharge Frequent or constant Supervision/Assistance  ?Patient can return home with the following ? A lot of help with walking and/or transfers;A little help with bathing/dressing/bathroom;Assistance with cooking/housework;Direct supervision/assist for financial management;Direct supervision/assist for medications management;Help with stairs or ramp for entrance;Assist for transportation ? ?  ?Equipment Recommendations BSC/3in1  ?Recommendations for Other Services ?    ?  ?Functional Status Assessment Patient has had a recent decline in their functional status and demonstrates the ability to make significant improvements in function in a reasonable and predictable amount of time.  ? ?  ?Precautions  / Restrictions Precautions ?Precautions: Fall ?Precaution Comments: limited vision  ? ?  ? ?Mobility ? Bed Mobility ?Overal bed mobility: Needs Assistance ?Bed Mobility: Supine to Sit ?  ?  ?Supine to sit: HOB elevated ?  ?  ?General bed mobility comments: HOb 25 degrees with physical assist to initiate transfer to move legs off bed and elevate trunk ?  ? ?Transfers ?Overall transfer level: Needs assistance ?  ?Transfers: Sit to/from Stand, Bed to chair/wheelchair/BSC ?Sit to Stand: Min assist ?  ?Step pivot transfers: Min assist ?  ?  ?  ?General transfer comment: initial attempt to rise from low surface pt with very limited effort and unable to rise, elevated surface with delayed response to initiation then pt with quick transition to standing. Lack of control of descent. pt with very short, shuffling steps with max cues for RW use. Pt stepped forward grossly 1' then denied attempting gait and stepped back to chair ?  ? ?Ambulation/Gait ?  ?  ?  ?  ?  ?  ?  ?General Gait Details: pt denied attempting ? ?Stairs ?  ?  ?  ?  ?  ? ?Wheelchair Mobility ?  ? ?Modified [name] (Stroke Patients Only) ?  ? ?  ? ?Balance Overall balance assessment: Needs assistance ?Sitting-balance support: No upper extremity supported, Feet supported ?Sitting balance-Leahy Scale: Fair ?  ?  ?Standing balance support: Bilateral upper extremity supported ?Standing balance-Leahy Scale: Poor ?Standing balance comment: reliant on RW for standing ?  ?  ?  ?  ?  ?  ?  ?  ?  ?  ?  ?   ? ? ? ?Pertinent Vitals/Pain Pain Assessment ?Pain Assessment: No/denies pain  ? ? ?Home Living Family/patient  expects to be discharged to:: Private residence ?Living Arrangements: Children ?Available Help at Discharge: Family;Available PRN/intermittently ?Type of Home: House ?Home Access: Stairs to enter ?  ?Entrance Stairs-Number of Steps: 3 ?  ?Home Layout: Two level;Able to live on main level with bedroom/bathroom ?Home Equipment: Rollator (4 wheels);Cane - single  point;Shower seat ?Additional Comments: sleeps on the couch  ?  ?Prior Function Prior Level of Function : Needs assist ?  ?  ?  ?Physical Assist : ADLs (physical) ?  ?  ?Mobility Comments: cane in house, rollator in the community ?ADLs Comments: bathes 1x/wk on shower chair, usually wears briefs and manages toileting and dressing on his own. Family does medication management and homemaking ?  ? ? ?Hand Dominance  ?   ? ?  ?Extremity/Trunk Assessment  ? Upper Extremity Assessment ?Upper Extremity Assessment: Generalized weakness ?  ? ?Lower Extremity Assessment ?Lower Extremity Assessment: Generalized weakness ?  ? ?Cervical / Trunk Assessment ?Cervical / Trunk Assessment: Kyphotic  ?Communication  ? Communication: HOH  ?Cognition Arousal/Alertness: Awake/alert ?Behavior During Therapy: Flat affect ?Overall Cognitive Status: Impaired/Different from baseline ?Area of Impairment: Following commands, Safety/judgement, Problem solving ?  ?  ?  ?  ?  ?  ?  ?  ?  ?  ?  ?Following Commands: Follows one step commands with increased time, Follows one step commands inconsistently ?Safety/Judgement: Decreased awareness of safety, Decreased awareness of deficits ?  ?Problem Solving: Slow processing ?General Comments: pt with slow processing, decreased responses to questions and commands needing repetition. Pt unable to state deficits in function and daughter present to provide home setup and PLOF ?  ?  ? ?  ?General Comments   ? ?  ?Exercises    ? ?Assessment/Plan  ?  ?PT Assessment Patient needs continued PT services  ?PT Problem List Decreased strength;Decreased mobility;Decreased safety awareness;Decreased activity tolerance;Decreased cognition;Decreased coordination;Decreased balance;Decreased knowledge of use of DME ? ?   ?  ?PT Treatment Interventions Gait training;Balance training;Functional mobility training;Therapeutic activities;Patient/family education;Cognitive remediation;DME instruction;Therapeutic exercise;Stair  training   ? ?PT Goals (Current goals can be found in the Care Plan section)  ?Acute Rehab PT Goals ?Patient Stated Goal: return home and watch tv ?PT Goal Formulation: With patient/family ?Time For Goal Achievement: 02/17/22 ?Potential to Achieve Goals: Fair ? ?  ?Frequency Min 3X/week ?  ? ? ?Co-evaluation   ?  ?  ?  ?  ? ? ?  ?AM-PAC PT "6 Clicks" Mobility  ?Outcome Measure Help needed turning from your back to your side while in a flat bed without using bedrails?: A Little ?Help needed moving from lying on your back to sitting on the side of a flat bed without using bedrails?: A Little ?Help needed moving to and from a bed to a chair (including a wheelchair)?: A Little ?Help needed standing up from a chair using your arms (e.g., wheelchair or bedside chair)?: A Little ?Help needed to walk in hospital room?: Total ?Help needed climbing 3-5 steps with a railing? : Total ?6 Click Score: 14 ? ?  ?End of Session Equipment Utilized During Treatment: Gait belt ?Activity Tolerance: Patient limited by fatigue ?Patient left: in chair;with call bell/phone within reach;with chair alarm set;with family/visitor present ?Nurse Communication: Mobility status ?PT Visit Diagnosis: Other abnormalities of gait and mobility (R26.89);Difficulty in walking, not elsewhere classified (R26.2);Muscle weakness (generalized) (M62.81) ?  ? ?Time: [id number] ?PT Time Calculation (min) (ACUTE ONLY): 27 min ? ? ?Charges:   PT Evaluation ?$PT Eval Moderate Complexity: 1  Mod ?PT Treatments ?$Therapeutic Activity: 8-22 mins ?  ?   ? ? ?Mayzee Reichenbach P, PT ?Acute Rehabilitation Services ?Pager: [phone] ?Office: [phone] ? ? ?Kodey Xue B Curtistine Pettitt ?02/03/2022, 9:47 AM ? ?

## 2022-02-03 NOTE — Assessment & Plan Note (Addendum)
-   Likely multifactorial in setting of underlying presumed UTI as well as probable dehydration ?-Treated with fluids ?-Mentation improved back to baseline prior to discharge ?

## 2022-02-03 NOTE — Assessment & Plan Note (Addendum)
-   Rate currently controlled ?- On Coumadin at home.  Continue Coumadin per pharmacy ?-Patient instructed to resume Coumadin on 02/07/2022 along with INR check ?

## 2022-02-03 NOTE — TOC Initial Note (Addendum)
Transition of Care (TOC) - Initial/Assessment Note  ? ? ?Patient Details  ?Name: Warren Wagner. ?MRN: [id number] ?Date of Birth: [birth year] ? ?Transition of Care (TOC) CM/SW Contact:    ?Delilah Shan, LCSWA ?Phone Number: ?02/03/2022, 11:47 AM ? ?Clinical Narrative:           ?    ? ?CSW received consult for possible SNF placement at time of discharge.  Due to patients current orientation  CSW spoke with patients daughter Warren Wagner and Warren Wagner. Warren Wagner reports patient comes from home with two daughters and Warren Wagner. [name] reports patient has received both covid vaccines and 2 boosters. [name] gave CSW permission to fax out initial referral near the [facility] and [place] area. [name] reports patients first choice will be [facility]. CSW will follow up with patient this afternoon on decision. CSW will continue to follow and assist with patients dc planning needs. ?  ? ? ?Patient Goals and CMS Choice ?  ?  ?  ? ?Expected Discharge Plan and Services ?  ?  ?  ?  ?  ?                ?  ?  ?  ?  ?  ?  ?  ?  ?  ?  ? ?Prior Living Arrangements/Services ?  ?  ?  ?       ?  ?  ?  ?  ? ?Activities of Daily Living ?Home Assistive Devices/Equipment: Cane (specify quad or straight) ?ADL Screening (condition at time of admission) ?Patient's cognitive ability adequate to safely complete daily activities?: No ?Is the patient deaf or have difficulty hearing?: Yes ?Does the patient have difficulty seeing, even when wearing glasses/contacts?: Yes ?Does the patient have difficulty concentrating, remembering, or making decisions?: Yes ?Patient able to express need for assistance with ADLs?: Yes ?Independently performs ADLs?: No ?Communication: Needs assistance ?Is this a change from baseline?: Pre-admission baseline ?Dressing (OT): Needs assistance ?Is this a change from baseline?: Pre-admission baseline ?Grooming: Needs assistance ?Is this a change from baseline?: Pre-admission baseline ?Feeding: Independent ?Bathing: Needs assistance ?Is  this a change from baseline?: Pre-admission baseline ?Toileting: Independent ?In/Out Bed: Needs assistance ?Is this a change from baseline?: Pre-admission baseline ?Walks in Home: Needs assistance ?Is this a change from baseline?: Pre-admission baseline ?Does the patient have difficulty walking or climbing stairs?: Yes ?Weakness of Legs: Both ?Weakness of Arms/Hands: Both ? ?Permission Sought/Granted ?  ?  ?   ?   ?   ?   ? ?Emotional Assessment ?  ?  ?  ?  ?  ?  ? ?Admission diagnosis:  Weakness [R53.1] ?Near syncope [R55] ?Postural dizziness with presyncope [R42, R55] ?Patient Active Problem List  ? Diagnosis Date Noted  ? Rhabdomyolysis 02/03/2022  ? COPD with acute exacerbation (HCC) 02/03/2022  ? Ileus (HCC) 02/03/2022  ? Postural dizziness with presyncope 02/02/2022  ? Aneurysm, aorta, thoracic (HCC) 12/29/2021  ? Insomnia 12/29/2021  ? Benign prostatic hyperplasia 12/29/2021  ? Environmental and seasonal allergies 12/29/2021  ? Advanced nonexudative age-related macular degeneration of left eye with subfoveal involvement 07/06/2021  ? Long term (current) use of anticoagulants 02/19/2021  ? Essential hypertension 01/11/2021  ? 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 Advanced nonexudative age-related macular degeneration of right eye without subfoveal involvement 03/02/2020  ? Posterior vitreous detachment of both eyes 03/02/2020  ? Lymphedema 11/22/2019  ? Bilateral leg edema 11/13/2019  ? Venous  stasis of lower extremity 11/13/2019  ? Venous stasis ulcer of right calf with fat layer exposed with varicose veins (HCC) 11/13/2019  ? Atrial fibrillation (HCC) 07/27/2012  ? ?PCP:  Mayer Masker, PA-C ?Pharmacy:  No Pharmacies Listed ? ? ? ?Social Determinants of Health (SDOH) Interventions ?  ? ?Readmission Risk Interventions ?   ? View : No data to display.  ?  ?  ?  ? ? ? ?

## 2022-02-03 NOTE — Assessment & Plan Note (Signed)
Rehydrate and follow CK ?

## 2022-02-03 NOTE — Hospital Course (Addendum)
Warren Wagner is an 87 yo male with PMH afib, HTN, BPH, hard of hearing who presented to the hospital with worsening weakness and confusion per family.  ?Further collateral information after admission was obtained and patient was noted to have sat in the car on Tuesday for approximately 2 hours as he did not wish to get out for an event that family was going to. The windows were open but he did appear to have sweat some during this.  Throughout the rest of the week he became progressively more weak and confused. ?Work-up on admission revealed elevated CK, 1747; urinalysis was suggestive of UTI. ?Abdominal x-ray was concerning for possible ileus or obstruction.  CT abdomen/pelvis ruled out any underlying obstruction and showed moderate stool burden. ?CT head is unremarkable for acute findings but revealed underlying chronic small vessel disease and brain atrophy.  Questionable periapical lucency involving left upper tooth which could not rule out periapical abscess. ?He was started on antibiotics, fluids, and admitted for further work-up and monitoring. ?See below for further plan  ?

## 2022-02-04 DIAGNOSIS — G9341 Metabolic encephalopathy: Secondary | ICD-10-CM | POA: Diagnosis not present

## 2022-02-04 DIAGNOSIS — J9601 Acute respiratory failure with hypoxia: Secondary | ICD-10-CM | POA: Diagnosis not present

## 2022-02-04 DIAGNOSIS — Z1611 Resistance to penicillins: Secondary | ICD-10-CM | POA: Diagnosis not present

## 2022-02-04 DIAGNOSIS — N390 Urinary tract infection, site not specified: Secondary | ICD-10-CM

## 2022-02-04 DIAGNOSIS — Z20822 Contact with and (suspected) exposure to covid-19: Secondary | ICD-10-CM | POA: Diagnosis not present

## 2022-02-04 DIAGNOSIS — R55 Syncope and collapse: Secondary | ICD-10-CM | POA: Diagnosis not present

## 2022-02-04 DIAGNOSIS — I350 Nonrheumatic aortic (valve) stenosis: Secondary | ICD-10-CM

## 2022-02-04 DIAGNOSIS — I129 Hypertensive chronic kidney disease with stage 1 through stage 4 chronic kidney disease, or unspecified chronic kidney disease: Secondary | ICD-10-CM | POA: Diagnosis not present

## 2022-02-04 DIAGNOSIS — I4891 Unspecified atrial fibrillation: Secondary | ICD-10-CM | POA: Diagnosis not present

## 2022-02-04 DIAGNOSIS — J449 Chronic obstructive pulmonary disease, unspecified: Secondary | ICD-10-CM | POA: Diagnosis not present

## 2022-02-04 DIAGNOSIS — J9811 Atelectasis: Secondary | ICD-10-CM | POA: Diagnosis not present

## 2022-02-04 DIAGNOSIS — Z515 Encounter for palliative care: Secondary | ICD-10-CM | POA: Diagnosis not present

## 2022-02-04 DIAGNOSIS — M6282 Rhabdomyolysis: Secondary | ICD-10-CM | POA: Diagnosis not present

## 2022-02-04 DIAGNOSIS — I4819 Other persistent atrial fibrillation: Secondary | ICD-10-CM | POA: Diagnosis not present

## 2022-02-04 DIAGNOSIS — R531 Weakness: Secondary | ICD-10-CM | POA: Diagnosis not present

## 2022-02-04 DIAGNOSIS — I088 Other rheumatic multiple valve diseases: Secondary | ICD-10-CM | POA: Diagnosis not present

## 2022-02-04 DIAGNOSIS — E860 Dehydration: Secondary | ICD-10-CM | POA: Diagnosis not present

## 2022-02-04 DIAGNOSIS — E8809 Other disorders of plasma-protein metabolism, not elsewhere classified: Secondary | ICD-10-CM | POA: Diagnosis not present

## 2022-02-04 DIAGNOSIS — J90 Pleural effusion, not elsewhere classified: Secondary | ICD-10-CM | POA: Diagnosis not present

## 2022-02-04 DIAGNOSIS — I272 Pulmonary hypertension, unspecified: Secondary | ICD-10-CM | POA: Diagnosis not present

## 2022-02-04 DIAGNOSIS — R42 Dizziness and giddiness: Secondary | ICD-10-CM | POA: Diagnosis not present

## 2022-02-04 LAB — CBC WITH DIFFERENTIAL/PLATELET
Abs Immature Granulocytes: 0.05 10*3/uL (ref 0.00–0.07)
Basophils Absolute: 0 10*3/uL (ref 0.0–0.1)
Basophils Relative: 0 %
Eosinophils Absolute: 0 10*3/uL (ref 0.0–0.5)
Eosinophils Relative: 0 %
HCT: 41.1 % (ref 39.0–52.0)
Hemoglobin: 13.7 g/dL (ref 13.0–17.0)
Immature Granulocytes: 0 %
Lymphocytes Relative: 7 %
Lymphs Abs: 0.8 10*3/uL (ref 0.7–4.0)
MCH: 29.9 pg (ref 26.0–34.0)
MCHC: 33.3 g/dL (ref 30.0–36.0)
MCV: 89.7 fL (ref 80.0–100.0)
Monocytes Absolute: 1.3 10*3/uL — ABNORMAL HIGH (ref 0.1–1.0)
Monocytes Relative: 12 %
Neutro Abs: 9 10*3/uL — ABNORMAL HIGH (ref 1.7–7.7)
Neutrophils Relative %: 81 %
Platelets: 126 10*3/uL — ABNORMAL LOW (ref 150–400)
RBC: 4.58 MIL/uL (ref 4.22–5.81)
RDW: 13.5 % (ref 11.5–15.5)
WBC: 11.3 10*3/uL — ABNORMAL HIGH (ref 4.0–10.5)
nRBC: 0 % (ref 0.0–0.2)

## 2022-02-04 LAB — COMPREHENSIVE METABOLIC PANEL
ALT: 33 U/L (ref 0–44)
AST: 84 U/L — ABNORMAL HIGH (ref 15–41)
Albumin: 3.4 g/dL — ABNORMAL LOW (ref 3.5–5.0)
Alkaline Phosphatase: 48 U/L (ref 38–126)
Anion gap: 8 (ref 5–15)
BUN: 20 mg/dL (ref 8–23)
CO2: 25 mmol/L (ref 22–32)
Calcium: 8.7 mg/dL — ABNORMAL LOW (ref 8.9–10.3)
Chloride: 102 mmol/L (ref 98–111)
Creatinine, Ser: 1.27 mg/dL — ABNORMAL HIGH (ref 0.61–1.24)
GFR, Estimated: 56 mL/min — ABNORMAL LOW (ref >60)
Glucose, Bld: 122 mg/dL — ABNORMAL HIGH (ref 70–99)
Potassium: 4.3 mmol/L (ref 3.5–5.1)
Sodium: 135 mmol/L (ref 135–145)
Total Bilirubin: 1.3 mg/dL — ABNORMAL HIGH (ref 0.3–1.2)
Total Protein: 6.4 g/dL — ABNORMAL LOW (ref 6.5–8.1)

## 2022-02-04 LAB — CK: Total CK: 936 U/L — ABNORMAL HIGH (ref 49–397)

## 2022-02-04 LAB — MAGNESIUM: Magnesium: 2.1 mg/dL (ref 1.7–2.4)

## 2022-02-04 LAB — PROTIME-INR
INR: 2.2 — ABNORMAL HIGH (ref 0.8–1.2)
Prothrombin Time: 24.1 seconds — ABNORMAL HIGH (ref 11.4–15.2)

## 2022-02-04 MED ORDER — IPRATROPIUM-ALBUTEROL 0.5-2.5 (3) MG/3ML IN SOLN
3.0000 mL | Freq: Three times a day (TID) | RESPIRATORY_TRACT | Status: DC
  Administered 2022-02-05: 3 mL via RESPIRATORY_TRACT
  Filled 2022-02-04: qty 3

## 2022-02-04 MED ORDER — MELATONIN 5 MG PO TABS
10.0000 mg | ORAL_TABLET | Freq: Every evening | ORAL | Status: DC | PRN
Start: 2022-02-04 — End: 2022-02-05
  Administered 2022-02-04: 10 mg via ORAL
  Filled 2022-02-04: qty 2

## 2022-02-04 MED ORDER — LACTULOSE 10 GM/15ML PO SOLN
30.0000 g | Freq: Every day | ORAL | Status: DC
  Administered 2022-02-04 – 2022-02-05 (×2): 30 g via ORAL
  Filled 2022-02-04 (×2): qty 45

## 2022-02-04 MED ORDER — SORBITOL 70 % SOLN
30.0000 mL | Freq: Every day | Status: DC | PRN
  Filled 2022-02-04: qty 30

## 2022-02-04 MED ORDER — WARFARIN SODIUM 2.5 MG PO TABS
2.5000 mg | ORAL_TABLET | Freq: Once | ORAL | Status: AC
  Administered 2022-02-04: 2.5 mg via ORAL
  Filled 2022-02-04: qty 1

## 2022-02-04 NOTE — Assessment & Plan Note (Signed)
-   see presyncope workup ?- outpatient follow up with cardiology for repeat echo ?

## 2022-02-04 NOTE — Plan of Care (Signed)

## 2022-02-04 NOTE — Progress Notes (Signed)
?Progress Note ? ? ? ?Mendel Ryder Florinda Marker.   ?TOI:[id number]  ?DOB: June 12, 1935  ?DOA: 02/02/2022     1 ?PCP: Mayer Masker, PA-C ? ?Initial CC: weakness, presyncope, confusion ? ?Hospital Course: ?Mr. Neis is an 87 yo male with PMH afib, HTN, BPH, hard of hearing who presented to the hospital with worsening weakness and confusion per family.  ?Further collateral information after admission was obtained and patient was noted to have sat in the car on Tuesday for approximately 2 hours as he did not wish to get out for an event that family was going to. The windows were open but he did appear to have sweat some during this.  Throughout the rest of the week he became progressively more weak and confused. ?Work-up on admission revealed elevated CK, 1747; urinalysis was suggestive of UTI. ?Abdominal x-ray was concerning for possible ileus or obstruction.  CT abdomen/pelvis ruled out any underlying obstruction and showed moderate stool burden. ?CT head is unremarkable for acute findings but revealed underlying chronic small vessel disease and brain atrophy.  Questionable periapical lucency involving left upper tooth which could not rule out periapical abscess. ?He was started on antibiotics, fluids, and admitted for further work-up and monitoring. ? ?Interval History:  ?No events overnight.  Daughter present bedside this afternoon.  Mentation is dramatically improved compared to yesterday and he is sitting up in the chair and able to answer questions and follow commands easier today. ?Also had a bowel movement yesterday. ? 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  Not considered candidate for any intervention currently  during this hospitalization and recommended for outpatient follow-up for repeat echo in about 6 months and follow-up with cardiology ? ?Rhabdomyolysis ?- initial CK 1747 ?-Etiology appears to be possibly from sitting prolonged amount of time in the car while in the heat ?- CK is slowly downtrending; not high enough to cause renal failure ?- continue IVF ?- CK daily  ? ?Acute metabolic encephalopathy-resolved as of 02/04/2022 ?- Likely multifactorial in setting of underlying presumed UTI as well as probable dehydration ?- Continue fluids ?- Continue Rocephin and follow-up urine culture ?-Mentation greatly improved today but still slightly slowed ? ?UTI (urinary tract infection) ?- UA noted with moderate LE, negative nitrite, greater than 50 WBC, many bacteria on admission with altered mentation concerning for UTI ?- Antibiotics were transitioned to Rocephin and urine culture growing gram-negative rods ?- Follow-up urine culture and continue Rocephin for now ? ?Essential hypertension ?- Controlled on admission with some low/normal values ?- Hold antihypertensives for now ? ?Bilateral leg edema ?- echo does show some worsening AS and PV regurg ?- start albumin given hypoalb while giving IVF in setting of rhabdo and AMS ? ?Atrial fibrillation (HCC) ?- Rate currently controlled ?- On Coumadin at home.  Continue Coumadin per pharmacy ? ?Benign prostatic hyperplasia ?- Continue finasteride ? ?Aortic stenosis ?- see presyncope workup ?- outpatient follow up with cardiology for repeat echo ? ?Constipation ?- Noted on imaging ?- Continue bowel regimen ? ?COPD (chronic obstructive pulmonary disease) (HCC) ?- CXR negative for acute findings  ?- continue duonebs ? ? ?Old records reviewed in assessment of this patient ? ?Antimicrobials: ?Azithromycin 02/03/22 x 1 ?Rocephin 4/27 >> current ? ?DVT prophylaxis:  ? ? ? ?Code  Status:   Code Status: Full Code ? ?Disposition Plan:  pending clinical course ?Status is:  Obs ? ?Objective: ?Blood pressure 129/84, pulse (!) 102, temperature 98.4 ?F (36.9 ?C), temperature source Oral, resp. rate (!) 25, height 6' (1.829 m), weight 101.9 kg, SpO2 93 %.  ?Examination:  ?Physical Exam ?Constitutional:   ?   Comments: Much more awake and alert sitting up in recliner able to answer questions and follow commands easier  ?HENT:  ?   Head: Normocephalic and atraumatic.  ?   Mouth/Throat:  ?   Mouth: Mucous membranes are moist.  ?Eyes:  ?   Extraocular Movements: Extraocular movements intact.  ?Cardiovascular:  ?   Rate and Rhythm: Normal rate and regular rhythm.  ?   Heart sounds: Normal heart sounds.  ?Pulmonary:  ?   Effort: Pulmonary effort is normal. No respiratory distress.  ?   Breath sounds: Normal breath sounds.  ?Abdominal:  ?   General: Bowel sounds are normal. There is no distension.  ?   Palpations: Abdomen is soft.  ?   Tenderness: There is abdominal tenderness.  ?   Comments: Nonspecific TTP, no R/G  ?Musculoskeletal:     ?   General: Normal range of motion.  ?   Cervical back: Normal range of motion and neck supple.  ?Skin: ?   General: Skin is warm and dry.  ?Neurological:  ?   General: No focal deficit present.  ?   Mental Status: He is oriented to person, place, and time.  ?   Comments: Moves all 4 extremities and follows commands with difficulty  ?Psychiatric:     ?   Mood and Affect: Mood normal.  ?  ? ?Consultants:  ?Cardiology ? ?Procedures:  ? ? ?Data Reviewed: ?Results for orders placed or performed during the hospital encounter of 02/02/22 (from the past 24 hour(s))  ?Protime-INR     Status: Abnormal  ? Collection Time: 02/04/22  4:00 AM  ?Result Value Ref Range  ? Prothrombin Time 24.1 (H) 11.4 - 15.2 seconds  ? INR 2.2 (H) 0.8 - 1.2  ?CK     Status: Abnormal  ? Collection Time: 02/04/22  4:00 AM  ?Result Value Ref Range  ? Total CK 936 (H) 49 - 397 U/L  ?CBC with Differential/Platelet     Status: Abnormal  ? Collection Time: 02/04/22  4:00 AM  ?Result Value Ref Range   ? WBC 11.3 (H) 4.0 - 10.5 K/uL  ? RBC 4.58 4.22 - 5.81 MIL/uL  ? Hemoglobin 13.7 13.0 - 17.0 g/dL  ? HCT 41.1 39.0 - 52.0 %  ? MCV 89.7 80.0 - 100.0 fL  ? MCH 29.9 26.0 - 34.0 pg  ? MCHC 33.3 30.0 - 36.0 g/dL  ? RDW 13.5 11.5 - 15.5 %  ? Platelets 126 (L) 150 - 400 K/uL  ? nRBC 0.0 0.0 - 0.2 %  ? Neutrophils Relative % 81 %  ? Neutro Abs 9.0 (H) 1.7 - 7.7 K/uL  ? Lymphocytes Relative 7 %  ? Lymphs Abs 0.8 0.7 - 4.0 K/uL  ? Monocytes Relative 12 %  ? Monocytes Absolute 1.3 (H) 0.1 - 1.0 K/uL  ? Eosinophils Relative 0 %  ? Eosinophils Absolute 0.0 0.0 - 0.5 K/uL  ? Basophils Relative 0 %  ? Basophils Absolute 0.0 0.0 - 0.1 K/uL  ? Immature Granulocytes 0 %  ? Abs Immature Granulocytes 0.05 0.00 - 0.07 K/uL  ?Magnesium     Status: None  ?  Collection Time: 02/04/22  4:00 AM  ?Result Value Ref Range  ? Magnesium 2.1 1.7 - 2.4 mg/dL  ?Comprehensive metabolic panel     Status: Abnormal  ? Collection Time: 02/04/22  4:00 AM  ?Result Value Ref Range  ? Sodium 135 135 - 145 mmol/L  ? Potassium 4.3 3.5 - 5.1 mmol/L  ? Chloride 102 98 - 111 mmol/L  ? CO2 25 22 - 32 mmol/L  ? Glucose, Bld 122 (H) 70 - 99 mg/dL  ? BUN 20 8 - 23 mg/dL  ? Creatinine, Ser 1.27 (H) 0.61 - 1.24 mg/dL  ? Calcium 8.7 (L) 8.9 - 10.3 mg/dL  ? Total Protein 6.4 (L) 6.5 - 8.1 g/dL  ? Albumin 3.4 (L) 3.5 - 5.0 g/dL  ? AST 84 (H) 15 - 41 U/L  ? ALT 33 0 - 44 U/L  ? Alkaline Phosphatase 48 38 - 126 U/L  ? Total Bilirubin 1.3 (H) 0.3 - 1.2 mg/dL  ? GFR, Estimated 56 (L) >60 mL/min  ? Anion gap 8 5 - 15  ?  ?I have Reviewed nursing notes, Vitals, and Lab results since pt's last encounter. Pertinent lab results : see above ?I have ordered test including BMP, CBC, Mg ?I have reviewed the last note from staff over past 24 hours ?I have discussed pt's care plan and test results with nursing staff, case manager ? ? LOS: 1 day  ? ?Lewie Chamberavid Kajuan Guyton, MD ?[facility] ?02/04/2022, 5:54 PM ? ?

## 2022-02-04 NOTE — [place] FL2 (Signed)
? MEDICAID FL2 LEVEL OF CARE SCREENING TOOL  ?  ? ?IDENTIFICATION  ?Patient Name: ?Warren Wagner. Birthdate: Feb 25, 1935 Sex: male Admission Date (Current Location): ?02/02/2022  ?[place] Number: ? [place] ?  Facility and Address:  ?[facility], [address], [place], [place] [address] ?     Provider Number: ?[id number]  ?Attending Physician Name and Address:  ?Lewie Chamber, MD ? Relative Name and Phone Number:  ?[name] (daughter) [phone] [name] (daughter) [phone] ?   ?Current Level of Care: ?Hospital Recommended Level of Care: ?Skilled Nursing Facility Prior Approval Number: ?  ? ?Date Approved/Denied: ?  PASRR Number: ?[id number] A ? ?Discharge Plan: ?SNF ?  ? ?Current Diagnoses: ?Patient Active Problem List  ? Diagnosis Date Noted  ? Rhabdomyolysis 02/03/2022  ? COPD (chronic obstructive pulmonary disease) (HCC) 02/03/2022  ? Acute metabolic encephalopathy 02/03/2022  ? Constipation 02/03/2022  ? Postural dizziness with presyncope 02/02/2022  ? Aneurysm, aorta, thoracic (HCC) 12/29/2021  ? Insomnia 12/29/2021  ? Benign prostatic hyperplasia 12/29/2021  ? Environmental and seasonal allergies 12/29/2021  ? Advanced nonexudative age-related macular degeneration of left eye with subfoveal involvement 07/06/2021  ? Long term (current) use of anticoagulants 02/19/2021  ? Essential hypertension 01/11/2021  ? 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 Advanced nonexudative age-related macular degeneration of right eye without subfoveal involvement 03/02/2020  ? Posterior vitreous detachment of both eyes 03/02/2020  ? Lymphedema 11/22/2019  ? Bilateral leg edema 11/13/2019  ? Venous stasis of lower extremity 11/13/2019  ? Venous stasis ulcer of right calf with fat layer exposed with varicose veins (HCC) 11/13/2019  ? Atrial fibrillation (HCC) 07/27/2012  ? ? ?Orientation RESPIRATION  BLADDER Height & Weight   ?  ?Self ? O2 (Nasal Cannula 3 liters) Continent, External catheter (External Urinary Catheter) Weight: 224 lb 10.4 oz (101.9 kg) ?Height:  6' (182.9 cm)  ?BEHAVIORAL SYMPTOMS/MOOD NEUROLOGICAL BOWEL NUTRITION STATUS  ?    Continent (WDL) Diet (Please see discharge summary)  ?AMBULATORY STATUS COMMUNICATION OF NEEDS Skin   ?Extensive Assist Verbally Other (Comment) (Ecchymosis arm,bilateral,rash,leg,right,lower) ?  ?  ?  ?    ?     ?     ? ? ?Personal Care Assistance Level of Assistance  ?Bathing, Feeding, Dressing Bathing Assistance: Limited assistance ?Feeding assistance: Independent (able to feed self) ?Dressing Assistance: Limited assistance ?   ? ?Functional Limitations Info  ?Sight, Hearing, Speech Sight Info:  (WDL) ?Hearing Info: Adequate ?Speech Info: Adequate  ? ? ?SPECIAL CARE FACTORS FREQUENCY  ?PT (By licensed PT), OT (By licensed OT)   ?  ?PT Frequency: 5x min weekly ?OT Frequency: 5x min weekly ?  ?  ?  ?   ? ? ?Contractures Contractures Info: Not present  ? ? ?Additional Factors Info  ?Code Status, Allergies Code Status Info: FULL ?Allergies Info: No Known Allergies ?  ?  ?  ?   ? ?Current Medications (02/04/2022):  This is the current hospital active medication list ?Current Facility-Administered Medications  ?Medication Dose Route Frequency Provider Last Rate Last Admin  ? 0.9 %  sodium chloride infusion  100 mL/hr Intravenous Continuous Therisa Doyne, MD   Stopped at 02/03/22 0227  ? 0.9 %  sodium chloride infusion  75 mL/hr Intravenous Continuous Lewie Chamber, MD 75 mL/hr at 02/03/22 2044 75 mL/hr at 02/03/22 2044  ? acetaminophen (TYLENOL) tablet 650 mg  650 mg Oral Q4H PRN Lewie Chamber, MD  650 mg at 02/03/22 2216  ? Or  ? acetaminophen (TYLENOL) suppository 650 mg  650 mg Rectal Q4H PRN Lewie Chamber, MD      ? albumin human 25 % solution 25 g  25 g Intravenous Q6H Lewie Chamber, MD 60 mL/hr at 02/04/22 0553 25 g at 02/04/22 0553  ? albuterol (PROVENTIL)  (2.5 MG/3ML) 0.083% nebulizer solution 2.5 mg  2.5 mg Nebulization Q2H PRN Therisa Doyne, MD      ? bisacodyl (DULCOLAX) suppository 10 mg  10 mg Rectal Daily PRN Therisa Doyne, MD      ? cefTRIAXone (ROCEPHIN) 1 g in sodium chloride 0.9 % 100 mL IVPB  1 g Intravenous Q24H Lewie Chamber, MD   Stopped at 02/03/22 1623  ? feeding supplement (ENSURE ENLIVE / ENSURE PLUS) liquid 237 mL  237 mL Oral BID BM Lewie Chamber, MD      ? finasteride (PROSCAR) tablet 5 mg  5 mg Oral Daily Doutova, Anastassia, MD   5 mg at 02/04/22 0857  ? haloperidol lactate (HALDOL) injection 2 mg  2 mg Intramuscular Once Lewie Chamber, MD      ? HYDROcodone-acetaminophen (NORCO/VICODIN) 5-325 MG per tablet 1-2 tablet  1-2 tablet Oral Q4H PRN Therisa Doyne, MD   1 tablet at 02/03/22 2314  ? ipratropium-albuterol (DUONEB) 0.5-2.5 (3) MG/3ML nebulizer solution 3 mL  3 mL Nebulization Q6H Doutova, Anastassia, MD   3 mL at 02/04/22 0747  ? loratadine (CLARITIN) tablet 10 mg  10 mg Oral Daily Therisa Doyne, MD   10 mg at 02/04/22 0857  ? multivitamin with minerals tablet 1 tablet  1 tablet Oral Daily Lewie Chamber, MD   1 tablet at 02/04/22 0857  ? polyethylene glycol (MIRALAX / GLYCOLAX) packet 17 g  17 g Oral Daily PRN Doutova, Anastassia, MD      ? polyethylene glycol (MIRALAX / GLYCOLAX) packet 17 g  17 g Oral Daily Lewie Chamber, MD   17 g at 02/04/22 0908  ? polyvinyl alcohol (LIQUIFILM TEARS) 1.4 % ophthalmic solution 2 drop  2 drop Both Eyes PRN Lewie Chamber, MD      ? senna-docusate (Senokot-S) tablet 2 tablet  2 tablet Oral BID Lewie Chamber, MD   2 tablet at 02/04/22 0857  ? sodium chloride flush (NS) 0.9 % injection 3 mL  3 mL Intravenous Q12H Doutova, Anastassia, MD   3 mL at 02/03/22 1003  ? Warfarin - Pharmacist Dosing Inpatient   Does not apply q1600 Sampson Si, [facility]   Given at 02/03/22 1600  ? ? ? ?Discharge Medications: ?Please see discharge summary for a list of discharge  medications. ? ?Relevant Imaging Results: ? ?Relevant Lab Results: ? ? ?Additional Information ?[id number], Both Covid Vaccines, 2 boosters ? ?Delilah Shan, LCSWA ? ? ? ? ?

## 2022-02-04 NOTE — Progress Notes (Signed)
?   02/03/22 1400  ?Assess: MEWS Score  ?Temp (!) 103.5 ?F (39.7 ?C)  ?BP (!) 145/93  ?Pulse Rate (!) 110  ?ECG Heart Rate (!) 103  ?Resp (!) 27  ?SpO2 91 %  ?Assess: MEWS Score  ?MEWS Temp 2  ?MEWS Systolic 0  ?MEWS Pulse 1  ?MEWS RR 2  ?MEWS LOC 0  ?MEWS Score 5  ?MEWS Score Color Red  ?Assess: if the MEWS score is Yellow or Red  ?Were vital signs taken at a resting state? Yes  ?Focused Assessment No change from prior assessment  ?Early Detection of Sepsis Score *See Row Information* High  ?MEWS guidelines implemented *See Row Information* Yes  ?Treat  ?MEWS Interventions Administered prn meds/treatments  ?Pain Scale 0-10  ?Pain Score 0  ?Take Vital Signs  ?Increase Vital Sign Frequency  Red: Q 1hr X 4 then Q 4hr X 4, if remains red, continue Q 4hrs  ?Escalate  ?MEWS: Escalate Red: discuss with charge nurse/RN and provider, consider discussing with RRT  ?Notify: Charge Nurse/RN  ?Name of Charge Nurse/RN Notified charge RN  ?Date Charge Nurse/RN Notified 02/03/22  ?Time Charge Nurse/RN Notified 1400  ?Notify: Provider  ?Provider Name/Title Dr Frederick Peers  ?Date Provider Notified 02/03/22  ?Time Provider Notified 1400  ?Notification Type Page  ?Notification Reason Other (Comment) ?(temp 103.5)  ?Provider response See new orders  ?Date of Provider Response 02/03/22  ?Time of Provider Response 1400  ?Document  ?Patient Outcome Stabilized after interventions  ?Progress note created (see row info) Yes  ? ? ?

## 2022-02-04 NOTE — Progress Notes (Signed)
[employer] auth request submitted, ref # [id number]. Per MD, possible dc over weekend. [facility] is able to accept pt pending auth and medical clearance.  ? ?Dellie Burns, MSW, LCSW ?[phone] (coverage) ? ? ?

## 2022-02-04 NOTE — Progress Notes (Addendum)
This chaplain responded to PMT consult for notarizing the Pt. Advance Directive: HCPOA. The Pt. Answered clarify question about the role of the HCPOA.  The Pt. Daughter-[name] is at the bedside. ? ?The chaplain understands hospital volunteers are not available at this time. This chaplain will F/U with the Pt. at a later time today. ? ?**[id number]  This chaplain is present with the Pt., Pt. Daughters [name] and [name], notary, and witnesses for the notarizing of the Pt. HCPOA. ? ?The Pt. named Warren Wagner as his HCPOA. If the HCPOA is unable or unable to serve as HCPOA the Pt. next choice is Warren Wagner. ? ?The chaplain gave the Pt. the original AD along with two copies. The chaplain scanned the Pt. AD into EMR.  ? ?This chaplain is available for F/U spiritual care as needed. ? ?Chaplain Stephanie Acre ?[phone] ?

## 2022-02-04 NOTE — Progress Notes (Signed)
?   02/03/22 2214  ?Assess: MEWS Score  ?Temp (!) 101.8 ?F (38.8 ?C)  ?Assess: MEWS Score  ?MEWS Temp 2  ?MEWS Systolic 0  ?MEWS Pulse 0  ?MEWS RR 0  ?MEWS LOC 0  ?MEWS Score 2  ?MEWS Score Color Yellow  ?Assess: if the MEWS score is Yellow or Red  ?Were vital signs taken at a resting state? Yes  ?Focused Assessment No change from prior assessment  ?Early Detection of Sepsis Score *See Row Information* Low  ?MEWS guidelines implemented *See Row Information* No, previously yellow, continue vital signs every 4 hours  ?Treat  ?MEWS Interventions Administered scheduled meds/treatments  ?Pain Scale 0-10  ?Pain Score 0  ? ? ?

## 2022-02-04 NOTE — Progress Notes (Signed)
?  Mobility Specialist Criteria Algorithm Info. ? ? 02/04/22 1600  ?Mobility  ?Activity Transferred to/from BSC;Stood at bedside  ?Range of Motion/Exercises Active;All extremities  ?Level of Assistance Moderate assist, patient does 50-74%  ?Assistive Device BSC  ?Activity Response Tolerated fair  ? ?Patient received in bed requesting assistance to [facility] for BM. Required mod A for bed mobility from supine > sit, needed cues to use bed rails. Stood with minimal assist but required mod A for safety while transferring to [facility]. Required max cues for hand placement. Pt sat on BSC for approximately 20 minutes without success for a BM. Returned B2B with mod A. Was left with all needs met, call bell in reach. ? ?02/04/2022 ?4:26 PM ? ?Martinique Konnor Jorden, CMS, BS EXP ?Acute Rehabilitation Services  ?UVOZD:[phone] ?Office: [phone] ? ?

## 2022-02-04 NOTE — Assessment & Plan Note (Addendum)
-   UA noted with moderate LE, negative nitrite, greater than 50 WBC, many bacteria on admission with altered mentation concerning for UTI ?- Antibiotics were transitioned to Rocephin and urine culture growing gram-negative rods which ultimately speciated to ampicillin resistant Proteus.  He completed 3 days of Rocephin during hospitalization however discussed with pharmacy and high chance of possible Rocephin resistance/ESBL organism.  Patient treated with one-time dose of fosfomycin prior to discharge to complete course ?

## 2022-02-04 NOTE — Progress Notes (Signed)
ANTICOAGULATION CONSULT NOTE  ? ?Pharmacy Consult for warfarin ?Indication: atrial fibrillation ? ?No Known Allergies ? ?Patient Measurements: ?Weight: 100 kg  ? ?Vital Signs: ?Temp: 97.6 ?F (36.4 ?C) (04/28 0901) ?Temp Source: Oral (04/28 0901) ?BP: 133/87 (04/28 [id number]) ?Pulse Rate: 96 (04/28 0828) ? ?Labs: ?Recent Labs  ?  02/02/22 ?1551 02/02/22 ?1619 02/02/22 ?1619 02/02/22 ?2234 02/03/22 ?0054 02/03/22 ?[id number] 02/03/22 ?[id number] 02/03/22 ?1250 02/04/22 ?0400  ?HGB 14.8 13.9  --   --   --  14.3  --   --  13.7  ?HCT 45.2 41.0  --   --   --  43.1  --   --  41.1  ?PLT 148*  --   --   --   --  138*  --   --  126*  ?APTT 32  --   --   --   --   --   --   --   --   ?LABPROT 25.7*  --   --   --   --  25.1*  --   --  24.1*  ?INR 2.4*  --   --   --   --  2.3*  --   --  2.2*  ?CREATININE 1.29* 1.20  --   --   --  1.26*  --   --  1.27*  ?CKTOTAL  --   --    < > 1,747*  --  1,566*  --  1,529* 936*  ?TROPONINIHS  --   --   --  22* 22*  --  26*  --   --   ? < > = values in this interval not displayed.  ? ? ? ?Estimated Creatinine Clearance: 53.5 mL/min (A) (by C-G formula based on SCr of 1.27 mg/dL (H)). ? ? ?Medical History: ?Past Medical History:  ?Diagnosis Date  ? Atrial fibrillation ([place])   ? Hearing loss   ? Hypertension   ? Prostate enlargement   ? Umbilical hernia [id number]  ? ? ?Medications:  ?Medications Prior to Admission  ?Medication Sig Dispense Refill Last Dose  ? acetaminophen (TYLENOL) 500 MG tablet Take 1,000 mg by mouth every 6 (six) hours as needed for moderate pain or headache.   02/01/2022  ? calcium-vitamin D (OSCAL WITH D) 500-5 MG-MCG tablet Take 1 tablet by mouth daily with breakfast.   02/02/2022  ? cetirizine (ZYRTEC) 10 MG tablet Take 1 tablet (10 mg total) by mouth daily. 90 tablet 1 02/02/2022  ? docusate sodium (COLACE) 100 MG capsule Take 100 mg by mouth at bedtime as needed for mild constipation.   Past Week  ? finasteride (PROSCAR) 5 MG tablet Take 1 tablet (5 mg total) by mouth daily. 90 tablet 1  02/02/2022  ? fish oil-omega-3 fatty acids 1000 MG capsule Take 1 g by mouth daily.   02/02/2022  ? furosemide (LASIX) 40 MG tablet Take 1 tablet (40 mg total) by mouth daily. 90 tablet 3 02/02/2022  ? melatonin 5 MG TABS Take 5-10 mg by mouth at bedtime as needed (sleep).   Past Week  ? metoprolol succinate (TOPROL-XL) 50 MG 24 hr tablet Take 1 tablet (50 mg total) by mouth daily. Take with or immediately following a meal. 90 tablet 1 02/02/2022 at 0800  ? Multiple Vitamins-Minerals (MULTIVITAMIN WITH MINERALS) tablet Take 1 tablet by mouth daily.   02/02/2022  ? Multiple Vitamins-Minerals (OCUVITE PO) Take 1 tablet by mouth daily.   02/02/2022  ? sodium chloride (OCEAN) 0.65 % SOLN  nasal spray Place 1 spray into both nostrils as needed for congestion.   Past Week  ? traZODone (DESYREL) 50 MG tablet Take 0.5-1 tablets (25-50 mg total) by mouth at bedtime as needed for sleep. (Patient taking differently: Take 50 mg by mouth at bedtime.) 30 tablet 1 02/01/2022  ? warfarin (COUMADIN) 5 MG tablet TAKE 1 TO 2 TABLETS BY MOUTH ONCE DAILY AS  DIRECTED (Patient taking differently: Take 2.5-5 mg by mouth See admin instructions. 2.5 mg on Monday, Wednesday, Friday ?5 mg  on Tuesday, Tuesday, Thursday,Saturday and sunday) 85 tablet 0 02/02/2022 at 080  ? ? ?Assessment: ?77 YOM who presented to the ED with acute onset of weakness and change in mental status. Pt is on warfarin PTA for h/o AFib.  ?-INR= 2.2 ?  ? ?Home warfarin dose: 2.5 mg on Mon, Wed, Fri; 5 mg on all other days.  ? ?Goal of Therapy:  ?INR 2-3 ?Monitor platelets by anticoagulation protocol: Yes ?  ?Plan:  ?-Warfarin 5mg  today ?-Daily PT/INR ? ?Hildred Laser, PharmD ?Clinical Pharmacist ?**Pharmacist phone directory can now be found on [email or web address] ([email or web address]).  Listed under [place]. ? ? ? ?

## 2022-02-04 NOTE — Progress Notes (Signed)
Physical Therapy Treatment ?Patient Details ?Name: Warren Wagner. ?MRN: [id number] ?DOB: [birth year] ?Today's Date: 02/04/2022 ? ? ?History of Present Illness 87 yo admitted 4/26 with presyncope and lowered himself to the ground at home. PMhx: lymphedema, HTN, Afib ? ?  ?PT Comments  ? ? Pt with increased interraction and verbalization today but also quick to be agitated with cues for tasks or assist to correct gait. Pt continues to require physical assist with all mobility and HR up to 148 with limited gait in room. Pt required supplemental O2 2L with sats 88-94% with activity. Pt and family remain agreeable on SNF given pt's functional and cognitive deficits, will continue to follow.  ?   ?Recommendations for follow up therapy are one component of a multi-disciplinary discharge planning process, led by the attending physician.  Recommendations may be updated based on patient status, additional functional criteria and insurance authorization. ? ?Follow Up Recommendations ? Skilled nursing-short term rehab (<3 hours/day) ?  ?  ?Assistance Recommended at Discharge Frequent or constant Supervision/Assistance  ?Patient can return home with the following A lot of help with walking and/or transfers;A little help with bathing/dressing/bathroom;Assistance with cooking/housework;Direct supervision/assist for financial management;Direct supervision/assist for medications management;Help with stairs or ramp for entrance;Assist for transportation ?  ?Equipment Recommendations ? BSC/3in1  ?  ?Recommendations for Other Services   ? ? ?  ?Precautions / Restrictions Precautions ?Precautions: Fall ?Precaution Comments: macular degeneration, watch HR/SpO2  ?  ? ?Mobility ? Bed Mobility ?Overal bed mobility: Needs Assistance ?Bed Mobility: Supine to Sit ?  ?  ?Supine to sit: HOB elevated ?Sit to supine: Min guard ?  ?General bed mobility comments: HOB 35 degrees with use of rail, mod cues for initiation with increased time to  complete ?  ? ?Transfers ?Overall transfer level: Needs assistance ?  ?Transfers: Sit to/from Stand ?Sit to Stand: Min assist ?  ?  ?  ?  ?  ?General transfer comment: pt with excessive attempts greater than 10 prior to pt being able to acheive rise from surface with assist and cues for hand placement ?  ? ?Ambulation/Gait ?Ambulation/Gait assistance: Min assist ?Gait Distance (Feet): 30 Feet ?Assistive device: Rolling walker (2 wheels) ?Gait Pattern/deviations: Shuffle, Trunk flexed ?  ?Gait velocity interpretation: <1.31 ft/sec, indicative of household ambulator ?  ?General Gait Details: pt with very short shuffling steps, pt pushes RW too anterior and off to his right, mod multimodal cues for correct position and safety but pt resistant to correcting and easily agitated wit assist to correct. Pauses during gait with difficulty following cues for advancing gait ? ? ?Stairs ?  ?  ?  ?  ?  ? ? ?Wheelchair Mobility ?  ? ?Modified [name] (Stroke Patients Only) ?  ? ? ?  ?Balance Overall balance assessment: Needs assistance ?Sitting-balance support: No upper extremity supported, Feet supported ?Sitting balance-Leahy Scale: Fair ?Sitting balance - Comments: static sitting EOB ?  ?Standing balance support: Bilateral upper extremity supported, Reliant on assistive device for balance ?Standing balance-Leahy Scale: Poor ?Standing balance comment: reliant on RW for standing with guarding ?  ?  ?  ?  ?  ?  ?  ?  ?  ?  ?  ?  ? ?  ?Cognition Arousal/Alertness: Awake/alert ?Behavior During Therapy: Flat affect ?Overall Cognitive Status: Impaired/Different from baseline ?Area of Impairment: Following commands, Safety/judgement, Problem solving ?  ?  ?  ?  ?  ?  ?  ?  ?  ?  ?  ?  Following Commands: Follows one step commands with increased time, Follows one step commands inconsistently ?Safety/Judgement: Decreased awareness of safety, Decreased awareness of deficits ?  ?Problem Solving: Slow processing ?General Comments: pt with  significant pauses at times with cues for activity or responses to direction. Pt also easily agitated at times with any redirection, no awareness of deficits ?  ?  ? ?  ?Exercises General Exercises - Lower Extremity ?Short Arc Quad: AROM, Both, 10 reps, Seated ?Hip Flexion/Marching: AAROM, Both, Seated, 10 reps ? ?  ?General Comments   ?  ?  ? ?Pertinent Vitals/Pain Pain Assessment ?Pain Assessment: No/denies pain  ? ? ?Home Living   ?  ?  ?  ?  ?  ?  ?  ?  ?  ?   ?  ?Prior Function    ?  ?  ?   ? ?PT Goals (current goals can now be found in the care plan section) Progress towards PT goals: Progressing toward goals ? ?  ?Frequency ? ? ? Min 3X/week ? ? ? ?  ?PT Plan Current plan remains appropriate  ? ? ?Co-evaluation   ?  ?  ?  ?  ? ?  ?AM-PAC PT "6 Clicks" Mobility   ?Outcome Measure ? Help needed turning from your back to your side while in a flat bed without using bedrails?: A Lot ?Help needed moving from lying on your back to sitting on the side of a flat bed without using bedrails?: A Lot ?Help needed moving to and from a bed to a chair (including a wheelchair)?: A Little ?Help needed standing up from a chair using your arms (e.g., wheelchair or bedside chair)?: A Little ?Help needed to walk in hospital room?: A Lot ?Help needed climbing 3-5 steps with a railing? : Total ?6 Click Score: 13 ? ?  ?End of Session Equipment Utilized During Treatment: Gait belt ?Activity Tolerance: Patient tolerated treatment well ?Patient left: in chair;with call bell/phone within reach;with chair alarm set ?Nurse Communication: Mobility status ?PT Visit Diagnosis: Other abnormalities of gait and mobility (R26.89);Difficulty in walking, not elsewhere classified (R26.2);Muscle weakness (generalized) (M62.81) ?  ? ? ?Time: [id number] ?PT Time Calculation (min) (ACUTE ONLY): 33 min ? ?Charges:  $Gait Training: 8-22 mins ?$Therapeutic Activity: 8-22 mins          ?          ? ?Erynn Vaca P, PT ?Acute Rehabilitation Services ?Pager:  [phone] ?Office: [phone] ? ? ? ?Shavana Calder B Jamiee Milholland ?02/04/2022, 11:16 AM ? ?

## 2022-02-04 NOTE — Plan of Care (Signed)
?  Problem: Clinical Measurements: ?Goal: Ability to maintain clinical measurements within normal limits will improve ?Outcome: Progressing ?  ?Problem: Clinical Measurements: ?Goal: Respiratory complications will improve ?Outcome: Progressing ?  ?Problem: Clinical Measurements: ?Goal: Cardiovascular complication will be avoided ?Outcome: Progressing ?  ?Problem: Safety: ?Goal: Ability to remain free from injury will improve ?Outcome: Progressing ?  ?Problem: Skin Integrity: ?Goal: Risk for impaired skin integrity will decrease ?Outcome: Progressing ?  ?Problem: Clinical Measurements: ?Goal: Cardiovascular complication will be avoided ?Outcome: Progressing ?  ? ?  ?

## 2022-02-04 NOTE — TOC Progression Note (Signed)
Transition of Care (TOC) - Progression Note  ? ? ?Patient Details  ?Name: Warren Wagner. ?MRN: [id number] ?Date of Birth: [birth year] ? ?Transition of Care (TOC) CM/SW Contact  ?Delilah Shan, LCSWA ?Phone Number: ?02/04/2022, 12:38 PM ? ?Clinical Narrative:    ? ?CSW provided patients daughter Warren Wagner with SNF bed offers for patient. [name] accepted SNF bed offer with [facility]. CSW spoke with tracy at [facility] who confirmed SNF bed for patient when patient is medically ready for dc. CSW following to start insurance authorization for patient when patient is close to being medically ready for dc. CSW will continue to follow and assist with patients dc planning needs. ? ? ?Expected Discharge Plan: Skilled Nursing Facility ?Barriers to Discharge: Continued Medical Work up ? ?Expected Discharge Plan and Services ?Expected Discharge Plan: Skilled Nursing Facility ?In-house Referral: Clinical Social Work ?  ?  ?Living arrangements for the past 2 months: Single Family Home ?                ?  ?  ?  ?  ?  ?  ?  ?  ?  ?  ? ? ?Social Determinants of Health (SDOH) Interventions ?  ? ?Readmission Risk Interventions ?   ? View : No data to display.  ?  ?  ?  ? ? ?

## 2022-02-04 NOTE — Progress Notes (Signed)
?                                                                                                                                                     ?                                                   ?Palliative Medicine Progress Note  ? ?Patient Name: Warren Wagner.       Date: 02/04/2022 ?DOB: 1935/09/14  Age: 87 y.o. MRN#: [id number] ?Attending Physician: Dwyane Dee, MD ?Primary Care Physician: Lorrene Reid, PA-C ?Admit Date: 02/02/2022 ? ?Reason for Consultation/Follow-up: Establishing goals of care ? ?HPI/Patient Profile: ?87 y.o. male  with past medical history of atrial fibrillation on Coumadin, venous stasis, hypertension, lymphedema, and CKD stage II who presented to the emergency department on 02/02/2022 with near syncope.  He was walking to the bathroom, became weak, and lowered himself to the ground.  CT head negative for acute intracranial abnormalities; did show chronic small vessel ischemic disease and brain atrophy.  KUB showed moderate stool in the colon and mild diffuse increased small and large bowel gas consistent with questionable ileus.  CK was elevated at 1747.  Also noted to have low albumin of 3.1. ?Admitted to [facility] with postural dizziness with presyncope and rhabdomyolysis.  ?  ? ?Subjective: ?I assessed patient at bedside.  He is alert and out of bed to the recliner.  He has no acute complaints.  He is oriented to person and place but is not able to verbalize why he is in the hospital.  However, he is able to clearly verbalize that he wants his daughters to make medical decisions for him when needed.  We discussed completing an HCPOA document - he relies understanding and is agreeable to this.  He is able to name [name] as his healthcare agent with Clarise Cruz as alternate. ? ?I spoke with daughters [name] and Clarise Cruz outside the room per their request.  We discussed the echo results from yesterday showing severe aortic valve stenosis as well as pulmonary hypertension mitral valve  regurgitation and pulmonic valve regurgitation.  He verbalizes understanding that per cardiology patient is not at a point where intervention is needed.  They understand he will need follow-up in 6 months.  ? ?We patient's additional acute medical issues including rhabdomyolysis, dehydration, and UTI.  Discussed that per cardiology patient's dizziness was likely due to infection and rhabdomyolysis.  ? ?We discussed his current illness and what it means in the larger context of his ongoing co-morbidities. Provided education on the natural trajectory of chronic illness, emphasizing that it is non-curable and progressive. Discussed that chronic illness results in decreased functional  status over time, as patients do not usually return to previous baseline after having an illness or exacerbation.  ? ?Advanced directives, concepts specific to code status, artifical feeding and hydration, and rehospitalization were considered and discussed.  At this time, family does not wish to make any major decisions.  Discussed that patient currently has acute metabolic encephalopathy likely due to UTI and dehydration.  Discussed this should resolve with treatment and patient's mental status will improve to baseline.  ? ?We discussed current recommendation from PT/OT for rehab to improve functional status.  Family is hopeful that patient will be able to return home after rehab. ? ?I offered and explained the option for an outpatient palliative referral to check-in periodically with patient and family and continue goals of care discussions. Discussed that outpatient palliative can also help with options for care if his condition declines in the future. Family is agreeable.  ? ? ? ?Objective: ? ?Physical Exam ?Vitals reviewed.  ?Constitutional:   ?   General: He is not in acute distress. ?   Comments: Chronically ill-appearing  ?Pulmonary:  ?   Effort: Pulmonary effort is normal.  ?Neurological:  ?   Mental Status: He is alert.  ?    Motor: Weakness present.  ?   Comments: Oriented to person, place, situation  ?         ? ?Vital Signs: BP 129/84 (BP Location: Left Arm)   Pulse (!) 102   Temp 98.4 ?F (36.9 ?C) (Oral)   Resp (!) 25   Ht 6' (1.829 m)   Wt 101.9 kg   SpO2 93%   BMI 30.47 kg/m?  ?SpO2: SpO2: 93 % ?O2 Device: O2 Device: Nasal Cannula ?O2 Flow Rate: O2 Flow Rate (L/min): 2.5 L/min ? ?Intake/output summary:  ?Intake/Output Summary (Last 24 hours) at 02/04/2022 1300 ?Last data filed at 02/04/2022 [phone] ?Gross per 24 hour  ?Intake 1354.54 ml  ?Output 700 ml  ?Net 654.54 ml  ? ? ?   ?Palliative Assessment/Data: PPS 40-50% ? ? ? ? ?Palliative Medicine Assessment & Plan  ? ?Assessment: ?Principal Problem: ?  Postural dizziness with presyncope ?Active Problems: ?  Atrial fibrillation ([place]) ?  Essential hypertension ?  Bilateral leg edema ?  Lymphedema ?  Benign prostatic hyperplasia ?  Rhabdomyolysis ?  COPD (chronic obstructive pulmonary disease) ([place]) ?  Acute metabolic encephalopathy ?  Constipation ?  ? ?Recommendations/Plan: ?Full code full scope  ?Continue current interventions ?Plan is for rehab to improve functional status ?Family is hopeful patient can return home after rehab ?Spiritual care consult to assist with completion of HCPOA document ?Outpatient palliative referral ? ? ?Prognosis: ? < 6 months ? ?Discharge Planning: ?[place] for rehab with Palliative care service follow-up ? ? ? ?Thank you for allowing the Palliative Medicine Team to assist in the care of this patient. ? ? ?Total time: 65 minutes ? ? ?Lavena Bullion, NP ? ? ?Please contact Palliative Medicine Team phone at [phone] for questions and concerns.  ?For individual providers, please see AMION. ? ? ? ? ? ?

## 2022-02-04 NOTE — Progress Notes (Signed)
Pt having some wheezing pt's heart rate fluctuating 100's to 120's at times. Pt has O2 [place] in place at 2L . Fever is breaking at this time. Pt has PRN albuterol breathing treatments. Hospitalist paged about pt current condition and the need for a breathing treatment other than albuterol at this time due to increase in heart rate. Awaiting response from MD. ?

## 2022-02-05 DIAGNOSIS — I350 Nonrheumatic aortic (valve) stenosis: Secondary | ICD-10-CM

## 2022-02-05 DIAGNOSIS — K5900 Constipation, unspecified: Secondary | ICD-10-CM

## 2022-02-05 DIAGNOSIS — I4819 Other persistent atrial fibrillation: Secondary | ICD-10-CM | POA: Diagnosis not present

## 2022-02-05 DIAGNOSIS — M6282 Rhabdomyolysis: Secondary | ICD-10-CM | POA: Diagnosis not present

## 2022-02-05 DIAGNOSIS — J9601 Acute respiratory failure with hypoxia: Secondary | ICD-10-CM

## 2022-02-05 DIAGNOSIS — I129 Hypertensive chronic kidney disease with stage 1 through stage 4 chronic kidney disease, or unspecified chronic kidney disease: Secondary | ICD-10-CM | POA: Diagnosis not present

## 2022-02-05 DIAGNOSIS — E860 Dehydration: Secondary | ICD-10-CM | POA: Diagnosis not present

## 2022-02-05 DIAGNOSIS — Z515 Encounter for palliative care: Secondary | ICD-10-CM | POA: Diagnosis not present

## 2022-02-05 DIAGNOSIS — J90 Pleural effusion, not elsewhere classified: Secondary | ICD-10-CM | POA: Diagnosis not present

## 2022-02-05 DIAGNOSIS — Z20822 Contact with and (suspected) exposure to covid-19: Secondary | ICD-10-CM | POA: Diagnosis not present

## 2022-02-05 DIAGNOSIS — J449 Chronic obstructive pulmonary disease, unspecified: Secondary | ICD-10-CM | POA: Diagnosis not present

## 2022-02-05 DIAGNOSIS — J9811 Atelectasis: Secondary | ICD-10-CM | POA: Diagnosis not present

## 2022-02-05 DIAGNOSIS — G9341 Metabolic encephalopathy: Secondary | ICD-10-CM | POA: Diagnosis not present

## 2022-02-05 DIAGNOSIS — R531 Weakness: Secondary | ICD-10-CM | POA: Diagnosis not present

## 2022-02-05 DIAGNOSIS — J3089 Other allergic rhinitis: Secondary | ICD-10-CM | POA: Diagnosis not present

## 2022-02-05 DIAGNOSIS — N390 Urinary tract infection, site not specified: Secondary | ICD-10-CM | POA: Diagnosis not present

## 2022-02-05 DIAGNOSIS — R55 Syncope and collapse: Secondary | ICD-10-CM | POA: Diagnosis not present

## 2022-02-05 DIAGNOSIS — I272 Pulmonary hypertension, unspecified: Secondary | ICD-10-CM | POA: Diagnosis not present

## 2022-02-05 DIAGNOSIS — E8809 Other disorders of plasma-protein metabolism, not elsewhere classified: Secondary | ICD-10-CM | POA: Diagnosis not present

## 2022-02-05 DIAGNOSIS — N3000 Acute cystitis without hematuria: Secondary | ICD-10-CM

## 2022-02-05 DIAGNOSIS — I088 Other rheumatic multiple valve diseases: Secondary | ICD-10-CM | POA: Diagnosis not present

## 2022-02-05 DIAGNOSIS — R42 Dizziness and giddiness: Secondary | ICD-10-CM | POA: Diagnosis not present

## 2022-02-05 DIAGNOSIS — Z1611 Resistance to penicillins: Secondary | ICD-10-CM | POA: Diagnosis not present

## 2022-02-05 LAB — COMPREHENSIVE METABOLIC PANEL
ALT: 34 U/L (ref 0–44)
AST: 78 U/L — ABNORMAL HIGH (ref 15–41)
Albumin: 3.8 g/dL (ref 3.5–5.0)
Alkaline Phosphatase: 37 U/L — ABNORMAL LOW (ref 38–126)
Anion gap: 9 (ref 5–15)
BUN: 23 mg/dL (ref 8–23)
CO2: 22 mmol/L (ref 22–32)
Calcium: 9.1 mg/dL (ref 8.9–10.3)
Chloride: 109 mmol/L (ref 98–111)
Creatinine, Ser: 1.19 mg/dL (ref 0.61–1.24)
GFR, Estimated: >60 mL/min (ref >60)
Glucose, Bld: 126 mg/dL — ABNORMAL HIGH (ref 70–99)
Potassium: 3.7 mmol/L (ref 3.5–5.1)
Sodium: 140 mmol/L (ref 135–145)
Total Bilirubin: 1.5 mg/dL — ABNORMAL HIGH (ref 0.3–1.2)
Total Protein: 6.3 g/dL — ABNORMAL LOW (ref 6.5–8.1)

## 2022-02-05 LAB — URINE CULTURE: Culture: >=100000 — AB

## 2022-02-05 LAB — CBC WITH DIFFERENTIAL/PLATELET
Abs Immature Granulocytes: 0.03 10*3/uL (ref 0.00–0.07)
Basophils Absolute: 0 10*3/uL (ref 0.0–0.1)
Basophils Relative: 0 %
Eosinophils Absolute: 0 10*3/uL (ref 0.0–0.5)
Eosinophils Relative: 0 %
HCT: 36.4 % — ABNORMAL LOW (ref 39.0–52.0)
Hemoglobin: 12.2 g/dL — ABNORMAL LOW (ref 13.0–17.0)
Immature Granulocytes: 0 %
Lymphocytes Relative: 6 %
Lymphs Abs: 0.6 10*3/uL — ABNORMAL LOW (ref 0.7–4.0)
MCH: 30.1 pg (ref 26.0–34.0)
MCHC: 33.5 g/dL (ref 30.0–36.0)
MCV: 89.9 fL (ref 80.0–100.0)
Monocytes Absolute: 1.2 10*3/uL — ABNORMAL HIGH (ref 0.1–1.0)
Monocytes Relative: 13 %
Neutro Abs: 7.8 10*3/uL — ABNORMAL HIGH (ref 1.7–7.7)
Neutrophils Relative %: 81 %
Platelets: 121 10*3/uL — ABNORMAL LOW (ref 150–400)
RBC: 4.05 MIL/uL — ABNORMAL LOW (ref 4.22–5.81)
RDW: 13.3 % (ref 11.5–15.5)
WBC: 9.6 10*3/uL (ref 4.0–10.5)
nRBC: 0 % (ref 0.0–0.2)

## 2022-02-05 LAB — MAGNESIUM: Magnesium: 2 mg/dL (ref 1.7–2.4)

## 2022-02-05 LAB — PROTIME-INR
INR: 3.8 — ABNORMAL HIGH (ref 0.8–1.2)
Prothrombin Time: 37 seconds — ABNORMAL HIGH (ref 11.4–15.2)

## 2022-02-05 LAB — CK: Total CK: 567 U/L — ABNORMAL HIGH (ref 49–397)

## 2022-02-05 MED ORDER — IPRATROPIUM-ALBUTEROL 0.5-2.5 (3) MG/3ML IN SOLN: 3.0000 mL | Freq: Four times a day (QID) | RESPIRATORY_TRACT | Status: DC | PRN

## 2022-02-05 MED ORDER — FOSFOMYCIN TROMETHAMINE 3 G PO PACK
3.0000 g | PACK | Freq: Once | ORAL | Status: AC
  Administered 2022-02-05: 3 g via ORAL
  Filled 2022-02-05: qty 3

## 2022-02-05 NOTE — Progress Notes (Signed)
SATURATION QUALIFICATIONS: (This note is used to comply with regulatory documentation for home oxygen) ? ?Patient Saturations on Room Air at Rest = 88-94% ? ?Patient Saturations on Room Air while Ambulating = 80-88% ? ?Patient Saturations on 4 Liters of oxygen while Ambulating = 84-92% ? ? ?

## 2022-02-05 NOTE — TOC Transition Note (Signed)
Transition of Care (TOC) - CM/SW Discharge Note ? ? ?Patient Details  ?Name: Warren Wagner. ?MRN: [id number] ?Date of Birth: 1934-11-20 ? ?Transition of Care (TOC) CM/SW Contact:  ?Kallie Locks, RN ?Phone Number: [phone] ?02/05/2022, 11:44 AM ? ? ?Clinical Narrative:  Writer informed family has decided to take patient home not SNF. Spoke with patient and daughter Marlana Salvage at bedside. Juliette states Mr. Coupe lives with her and his other daughter and grandchild. Mr. Rosencrans will have 24/7 at home. Will need DME- bedside commode, 3in1, and rolling walker, and home oxygen. MD aware of orders. Discussed home health. No preference in agency. Discussed home agency star ratings. Agreeable to [facility]. Will arrange home health PT/OT/RN/SW/aide. Confirmed address in chart is correct. Juliette contact number is [phone]. ? ?Kandee Keen with [facility] able to accept referral for home health.  ?Jasmine with [employer] contacted for DME to be delivered to room prior to dc.  ? ?Daughter to transport home upon hospital dc. ? ?Please re-consult TOC if further needs.  ? ? ? ?Final next level of care: Home w Home Health Services ?Barriers to Discharge: No Barriers Identified ? ? ?Patient Goals and CMS Choice ?Patient states their goals for this hospitalization and ongoing recovery are:: return home ?CMS Medicare.gov Compare Post Acute Care list provided to:: Other (Comment Required) (daughter/Juliette) ?Choice offered to / list presented to : Adult Children Marlana Salvage (daughter)) ? ?Discharge Placement ?  ?           ?  ?  ?  ?  ? ?Discharge Plan and Services ?In-house Referral: Clinical Social Work ?  ?           ?DME Arranged: 3-N-1, Oxygen, Walker rolling ?DME Agency: [facility] ?Date DME Agency Contacted: 02/05/22 ?Time DME Agency Contacted: 1143 ?Representative spoke with at DME Agency: Leavy Cella ?HH Arranged: RN, PT, OT, Nurse's Aide, Social Work ?HH Agency: [facility] ?Date HH Agency Contacted:  02/05/22 ?Time HH Agency Contacted: 1144 ?Representative spoke with at [facility] Agency: Kandee Keen ? ?Social Determinants of Health (SDOH) Interventions ?  ? ? ?Readmission Risk Interventions ?   ? View : No data to display.  ?  ?  ?  ? ? ? ? ? ?

## 2022-02-05 NOTE — Discharge Summary (Signed)
?Physician Discharge Summary ?  ?Patient: Warren Wagner. MRN: [id number] DOB: 08-Dec-1934  ?Admit date:     02/02/2022  ?Discharge date: 02/05/22  ?Discharge Physician: Lewie Chamber  ? ?PCP: Mayer Masker, PA-C  ? ?Recommendations at discharge:  ? ?Follow-up with cardiology.  Repeat echo recommended after approximately 6 months ? ?Discharge Diagnoses: ?Principal Problem: ?  Postural dizziness with presyncope ?Active Problems: ?  Rhabdomyolysis ?  UTI (urinary tract infection) ?  Acute respiratory failure with hypoxia (HCC) ?  Essential hypertension ?  Bilateral leg edema ?  Atrial fibrillation (HCC) ?  Lymphedema ?  Benign prostatic hyperplasia ?  COPD (chronic obstructive pulmonary disease) (HCC) ?  Constipation ?  Aortic stenosis ? ?Resolved Problems: ?  Acute metabolic encephalopathy ? ?Hospital Course: ?Warren Wagner is an 87 yo male with PMH afib, HTN, BPH, hard of hearing who presented to the hospital with worsening weakness and confusion per family.  ?Further collateral information after admission was obtained and patient was noted to have sat in the car on Tuesday for approximately 2 hours as he did not wish to get out for an event that family was going to. The windows were open but he did appear to have sweat some during this.  Throughout the rest of the week he became progressively more weak and confused. ?Work-up on admission revealed elevated CK, 1747; urinalysis was suggestive of UTI. ?Abdominal x-ray was concerning for possible ileus or obstruction.  CT abdomen/pelvis ruled out any underlying obstruction and showed moderate stool burden. ?CT head is unremarkable for acute findings but revealed underlying chronic small vessel disease and brain atrophy.  Questionable periapical lucency involving left upper tooth which could not rule out periapical abscess. ?He was started on antibiotics, fluids, and admitted for further work-up and monitoring. ?See below for further plan  ? ?Assessment and Plan: ?*  Postural dizziness with presyncope ?- etiology possibly from dehydration/rhabdo vs orthostasis vs cardiogenic ?- follow up echo (does shows severe AS, pulmonic valve regurg is mod/severe; findings appear progressed from 2021 echo) ?-Evaluated by cardiology inpatient, greatly appreciate assistance.  Not considered candidate for any intervention currently during this hospitalization and recommended for outpatient follow-up for repeat echo in about 6 months and follow-up with cardiology ? ?Rhabdomyolysis ?- initial CK 1747 ?-Down trended to 567 at discharge ?-Responded well to fluids ? ?Acute metabolic encephalopathy-resolved as of 02/04/2022 ?- Likely multifactorial in setting of underlying presumed UTI as well as probable dehydration ?-Treated with fluids ?-Mentation improved back to baseline prior to discharge ? ?Acute respiratory failure with hypoxia (HCC) ?- Possibly due to underlying atelectasis and fluids administered for rhabdomyolysis ?- Still had desaturation with ambulatory walk test prior to discharge and arranged for home oxygen ? ?UTI (urinary tract infection) ?- UA noted with moderate LE, negative nitrite, greater than 50 WBC, many bacteria on admission with altered mentation concerning for UTI ?- Antibiotics were transitioned to Rocephin and urine culture growing gram-negative rods which ultimately speciated to ampicillin resistant Proteus.  He completed 3 days of Rocephin during hospitalization however discussed with pharmacy and high chance of possible Rocephin resistance/ESBL organism.  Patient treated with one-time dose of fosfomycin prior to discharge to complete course ? ?Essential hypertension ?- Home regimen resumed ?- Caution with any further presyncope or hypotension ? ?Bilateral leg edema ?- echo does show some worsening AS and PV regurg ?-Treated with albumin and IV fluids during hospitalization ? ?Atrial fibrillation (HCC) ?- Rate currently controlled ?- On Coumadin at home.  Continue  Coumadin per pharmacy ?-Patient instructed to resume Coumadin on 02/07/2022 along with INR check ? ?Benign prostatic hyperplasia ?- Continue finasteride ? ?Aortic stenosis ?- see presyncope workup ?- outpatient follow up with cardiology for repeat echo ? ?Constipation ?- Noted on imaging ?- Continue bowel regimen.  Achieved adequate bowel movements prior to discharge ? ?COPD (chronic obstructive pulmonary disease) (HCC) ?- CXR negative for acute findings  ? ? ? ?  ? ? ?Consultants: Cardiology ?Procedures performed:   ?Disposition: Home health ?Diet recommendation:  ?Discharge Diet Orders (From admission, onward)  ? ?  Start     Ordered  ? 02/05/22 0000  Diet - low sodium heart healthy       ? 02/05/22 1149  ? ?  ?  ? ?  ? ?Cardiac diet ?DISCHARGE MEDICATION: ?Allergies as of 02/05/2022   ?No Known Allergies ?  ? ?  ?Medication List  ?  ? ?TAKE these medications   ? ?acetaminophen 500 MG tablet ?Commonly known as: TYLENOL ?Take 1,000 mg by mouth every 6 (six) hours as needed for moderate pain or headache. ?  ?calcium-vitamin D 500-5 MG-MCG tablet ?Commonly known as: OSCAL WITH D ?Take 1 tablet by mouth daily with breakfast. ?  ?cetirizine 10 MG tablet ?Commonly known as: ZYRTEC ?Take 1 tablet (10 mg total) by mouth daily. ?  ?docusate sodium 100 MG capsule ?Commonly known as: COLACE ?Take 100 mg by mouth at bedtime as needed for mild constipation. ?  ?finasteride 5 MG tablet ?Commonly known as: PROSCAR ?Take 1 tablet (5 mg total) by mouth daily. ?  ?fish oil-omega-3 fatty acids 1000 MG capsule ?Take 1 g by mouth daily. ?  ?furosemide 40 MG tablet ?Commonly known as: LASIX ?Take 1 tablet (40 mg total) by mouth daily. ?  ?melatonin 5 MG Tabs ?Take 5-10 mg by mouth at bedtime as needed (sleep). ?  ?metoprolol succinate 50 MG 24 hr tablet ?Commonly known as: TOPROL-XL ?Take 1 tablet (50 mg total) by mouth daily. Take with or immediately following a meal. ?  ?multivitamin with minerals tablet ?Take 1 tablet by mouth daily. ?   ?OCUVITE PO ?Take 1 tablet by mouth daily. ?  ?sodium chloride 0.65 % Soln nasal spray ?Commonly known as: OCEAN ?Place 1 spray into both nostrils as needed for congestion. ?  ?traZODone 50 MG tablet ?Commonly known as: DESYREL ?Take 0.5-1 tablets (25-50 mg total) by mouth at bedtime as needed for sleep. ?What changed:  ?how much to take ?when to take this ?  ?warfarin 5 MG tablet ?Commonly known as: COUMADIN ?Take as directed. If you are unsure how to take this medication, talk to your nurse or doctor. ?Original instructions: TAKE 1 TO 2 TABLETS BY MOUTH ONCE DAILY AS  DIRECTED ?What changed:  ?how much to take ?how to take this ?when to take this ?additional instructions ?  ? ?  ? ?  ?  ? ? ?  ?Durable Medical Equipment  ?(From admission, onward)  ?  ? ? ?  ? ?  Start     Ordered  ? 02/05/22 1313  For home use only DME Other see comment  Once       ?Comments: HOYER LIFT  ?Question:  Length of Need  Answer:  Lifetime  ? 02/05/22 1312  ? 02/05/22 1146  For home use only DME oxygen  Once       ?Question Answer Comment  ?Length of Need 6 Months   ?Mode or (Route) Nasal cannula   ?  Liters per Minute 4   ?Frequency Continuous (stationary and portable oxygen unit needed)   ?Oxygen conserving device Yes   ?Oxygen delivery system Gas   ?  ? 02/05/22 1145  ? 02/05/22 1059  For home use only DME Bedside commode  Once       ?Question:  Patient needs a bedside commode to treat with the following condition  Answer:  Generalized muscle weakness  ? 02/05/22 1058  ? 02/05/22 1059  For home use only DME Walker rolling  Once       ?Question Answer Comment  ?Walker: With 5 Inch Wheels   ?Patient needs a walker to treat with the following condition Generalized muscle weakness   ?  ? 02/05/22 1058  ? ?  ?  ? ?  ? ? Follow-up Information   ? ? [facility] Follow up.   ?Specialty: Home Health Services ?Why: [facility] for PT.OT.RN.SW. aide. Agency will call to schedule home visit. ?Contact information: ?[address] ?[address] ?[place] [address] ?[phone] ? ? ?  ?  ? ? [name], MD. Schedule an appointment as soon as possible for a visit in 1 month(s).   ?Specialty: Cardiology ?Contact information: ?[address]

## 2022-02-05 NOTE — Discharge Instructions (Signed)
Hold Coumadin until Monday and have an INR check on Monday ?

## 2022-02-05 NOTE — Progress Notes (Signed)
ANTICOAGULATION CONSULT NOTE  ? ?Pharmacy Consult for warfarin ?Indication: atrial fibrillation ? ?No Known Allergies ? ?Patient Measurements: ?Weight: 100 kg  ? ?Vital Signs: ?Temp: 97.6 ?F (36.4 ?C) (04/29 1345) ?Temp Source: Oral (04/29 1345) ?BP: 132/98 (04/29 1345) ?Pulse Rate: 105 (04/29 1345) ? ?Labs: ?Recent Labs  ?  02/02/22 ?1551 02/02/22 ?1619 02/02/22 ?2234 02/03/22 ?0054 02/03/22 ?6378 02/03/22 ?5885 02/03/22 ?1250 02/04/22 ?0400 02/05/22 ?0252  ?HGB 14.8   < >  --   --  14.3  --   --  13.7 12.2*  ?HCT 45.2   < >  --   --  43.1  --   --  41.1 36.4*  ?PLT 148*  --   --   --  138*  --   --  126* 121*  ?APTT 32  --   --   --   --   --   --   --   --   ?LABPROT 25.7*  --   --   --  25.1*  --   --  24.1* 37.0*  ?INR 2.4*  --   --   --  2.3*  --   --  2.2* 3.8*  ?CREATININE 1.29*   < >  --   --  1.26*  --   --  1.27* 1.19  ?CKTOTAL  --    < > 1,747*  --  1,566*  --  1,529* 936* 567*  ?TROPONINIHS  --   --  22* 22*  --  26*  --   --   --   ? < > = values in this interval not displayed.  ? ? ? ?Estimated Creatinine Clearance: 57.1 mL/min (by C-G formula based on SCr of 1.19 mg/dL). ? ? ?Medical History: ?Past Medical History:  ?Diagnosis Date  ? Atrial fibrillation (HCC)   ? Hearing loss   ? Hypertension   ? Prostate enlargement   ? Umbilical hernia 07/27/2012  ? ? ?Medications:  ?Medications Prior to Admission  ?Medication Sig Dispense Refill Last Dose  ? acetaminophen (TYLENOL) 500 MG tablet Take 1,000 mg by mouth every 6 (six) hours as needed for moderate pain or headache.   02/01/2022  ? calcium-vitamin D (OSCAL WITH D) 500-5 MG-MCG tablet Take 1 tablet by mouth daily with breakfast.   02/02/2022  ? cetirizine (ZYRTEC) 10 MG tablet Take 1 tablet (10 mg total) by mouth daily. 90 tablet 1 02/02/2022  ? docusate sodium (COLACE) 100 MG capsule Take 100 mg by mouth at bedtime as needed for mild constipation.   Past Week  ? finasteride (PROSCAR) 5 MG tablet Take 1 tablet (5 mg total) by mouth daily. 90 tablet 1  02/02/2022  ? fish oil-omega-3 fatty acids 1000 MG capsule Take 1 g by mouth daily.   02/02/2022  ? furosemide (LASIX) 40 MG tablet Take 1 tablet (40 mg total) by mouth daily. 90 tablet 3 02/02/2022  ? melatonin 5 MG TABS Take 5-10 mg by mouth at bedtime as needed (sleep).   Past Week  ? metoprolol succinate (TOPROL-XL) 50 MG 24 hr tablet Take 1 tablet (50 mg total) by mouth daily. Take with or immediately following a meal. 90 tablet 1 02/02/2022 at 0800  ? Multiple Vitamins-Minerals (MULTIVITAMIN WITH MINERALS) tablet Take 1 tablet by mouth daily.   02/02/2022  ? Multiple Vitamins-Minerals (OCUVITE PO) Take 1 tablet by mouth daily.   02/02/2022  ? sodium chloride (OCEAN) 0.65 % SOLN nasal spray Place 1 spray into both  nostrils as needed for congestion.   Past Week  ? traZODone (DESYREL) 50 MG tablet Take 0.5-1 tablets (25-50 mg total) by mouth at bedtime as needed for sleep. (Patient taking differently: Take 50 mg by mouth at bedtime.) 30 tablet 1 02/01/2022  ? warfarin (COUMADIN) 5 MG tablet TAKE 1 TO 2 TABLETS BY MOUTH ONCE DAILY AS  DIRECTED (Patient taking differently: Take 2.5-5 mg by mouth See admin instructions. 2.5 mg on Monday, Wednesday, Friday ?5 mg  on Tuesday, Tuesday, Thursday,Saturday and sunday) 85 tablet 0 02/02/2022 at 080  ? ? ?Assessment: ?70 YOM who presented to the ED with acute onset of weakness and change in mental status. Pt is on warfarin PTA for h/o AFib.  ? ?INR up to 3.8 ? ?Home warfarin dose: 2.5 mg on Mon, Wed, Fri; 5 mg on all other days.  ? ?Goal of Therapy:  ?INR 2-3 ?Monitor platelets by anticoagulation protocol: Yes ?  ?Plan:  ?-No coumadin today ?-Daily PT/INR ?-Resume coumadin Monday with INR check ? ?Ulyses Southward, PharmD, BCIDP, AAHIVP, CPP ?Infectious Disease Pharmacist ?02/05/2022 2:06 PM ? ? ? ? ? ?

## 2022-02-05 NOTE — Assessment & Plan Note (Signed)
-   Possibly due to underlying atelectasis and fluids administered for rhabdomyolysis ?- Still had desaturation with ambulatory walk test prior to discharge and arranged for home oxygen ?

## 2022-02-05 NOTE — TOC Progression Note (Signed)
Transition of Care (TOC) - Progression Note  ? ? ?Patient Details  ?Name: Warren Wagner. ?MRN: [id number] ?Date of Birth: 1934/11/22 ? ?Transition of Care (TOC) CM/SW Contact  ?Bary Castilla, LCSW ?Phone Number:[phone] ?02/05/2022, 8:15 AM ? ?Clinical Narrative:    ? ?CSW checked pt's authorization and it is still pending. ? ?TOC team will continue to assist with discharge planning needs.   ? ?Expected Discharge Plan: [place] ?Barriers to Discharge: Continued Medical Work up ? ?Expected Discharge Plan and Services ?Expected Discharge Plan: [place] ?In-house Referral: Clinical Social Work ?  ?  ?Living arrangements for the past 2 months: [place] ?                ?  ?  ?  ?  ?  ?  ?  ?  ?  ?  ? ? ?Social Determinants of Health (SDOH) Interventions ?  ? ?Readmission Risk Interventions ?   ? View : No data to display.  ?  ?  ?  ? ? ?

## 2022-02-07 ENCOUNTER — Encounter: Payer: Self-pay | Admitting: Physician Assistant

## 2022-02-08 ENCOUNTER — Ambulatory Visit (INDEPENDENT_AMBULATORY_CARE_PROVIDER_SITE_OTHER): Payer: Medicare Other

## 2022-02-08 DIAGNOSIS — Z5181 Encounter for therapeutic drug level monitoring: Secondary | ICD-10-CM | POA: Diagnosis not present

## 2022-02-08 DIAGNOSIS — Z7901 Long term (current) use of anticoagulants: Secondary | ICD-10-CM

## 2022-02-08 DIAGNOSIS — I4891 Unspecified atrial fibrillation: Secondary | ICD-10-CM

## 2022-02-08 LAB — POCT INR: INR: 2.1 (ref 2.0–3.0)

## 2022-02-08 NOTE — Patient Instructions (Addendum)
Description   ?Take 1.5 tablets today and then continue taking 1 tablet daily except 0.5 tablet on Monday, Wednesday and Friday.  ?Stay consistent with greens (1-2 times per week)  ?Recheck INR 1 week.  ?[facility] [phone] ?  ?   ?

## 2022-02-09 DIAGNOSIS — B964 Proteus (mirabilis) (morganii) as the cause of diseases classified elsewhere: Secondary | ICD-10-CM | POA: Diagnosis not present

## 2022-02-09 DIAGNOSIS — I129 Hypertensive chronic kidney disease with stage 1 through stage 4 chronic kidney disease, or unspecified chronic kidney disease: Secondary | ICD-10-CM | POA: Diagnosis not present

## 2022-02-09 DIAGNOSIS — M6282 Rhabdomyolysis: Secondary | ICD-10-CM | POA: Diagnosis not present

## 2022-02-09 DIAGNOSIS — I890 Lymphedema, not elsewhere classified: Secondary | ICD-10-CM | POA: Diagnosis not present

## 2022-02-09 DIAGNOSIS — N400 Enlarged prostate without lower urinary tract symptoms: Secondary | ICD-10-CM | POA: Diagnosis not present

## 2022-02-09 DIAGNOSIS — K5900 Constipation, unspecified: Secondary | ICD-10-CM | POA: Diagnosis not present

## 2022-02-09 DIAGNOSIS — I371 Nonrheumatic pulmonary valve insufficiency: Secondary | ICD-10-CM | POA: Diagnosis not present

## 2022-02-09 DIAGNOSIS — I878 Other specified disorders of veins: Secondary | ICD-10-CM | POA: Diagnosis not present

## 2022-02-09 DIAGNOSIS — J441 Chronic obstructive pulmonary disease with (acute) exacerbation: Secondary | ICD-10-CM | POA: Diagnosis not present

## 2022-02-09 DIAGNOSIS — H9190 Unspecified hearing loss, unspecified ear: Secondary | ICD-10-CM | POA: Diagnosis not present

## 2022-02-09 DIAGNOSIS — I4891 Unspecified atrial fibrillation: Secondary | ICD-10-CM | POA: Diagnosis not present

## 2022-02-09 DIAGNOSIS — J9601 Acute respiratory failure with hypoxia: Secondary | ICD-10-CM | POA: Diagnosis not present

## 2022-02-09 DIAGNOSIS — Z1611 Resistance to penicillins: Secondary | ICD-10-CM | POA: Diagnosis not present

## 2022-02-09 DIAGNOSIS — I350 Nonrheumatic aortic (valve) stenosis: Secondary | ICD-10-CM | POA: Diagnosis not present

## 2022-02-09 DIAGNOSIS — N182 Chronic kidney disease, stage 2 (mild): Secondary | ICD-10-CM | POA: Diagnosis not present

## 2022-02-09 DIAGNOSIS — N390 Urinary tract infection, site not specified: Secondary | ICD-10-CM | POA: Diagnosis not present

## 2022-02-09 NOTE — Patient Instructions (Signed)

## 2022-02-10 ENCOUNTER — Ambulatory Visit (INDEPENDENT_AMBULATORY_CARE_PROVIDER_SITE_OTHER): Payer: Medicare Other | Admitting: Physician Assistant

## 2022-02-10 ENCOUNTER — Encounter: Payer: Self-pay | Admitting: Physician Assistant

## 2022-02-10 VITALS — BP 113/71 | HR 73 | Temp 97.7°F | Ht 72.0 in | Wt 224.0 lb

## 2022-02-10 DIAGNOSIS — J9601 Acute respiratory failure with hypoxia: Secondary | ICD-10-CM

## 2022-02-10 DIAGNOSIS — R55 Syncope and collapse: Secondary | ICD-10-CM

## 2022-02-10 DIAGNOSIS — Z09 Encounter for follow-up examination after completed treatment for conditions other than malignant neoplasm: Secondary | ICD-10-CM

## 2022-02-10 DIAGNOSIS — I4891 Unspecified atrial fibrillation: Secondary | ICD-10-CM | POA: Diagnosis not present

## 2022-02-10 DIAGNOSIS — I10 Essential (primary) hypertension: Secondary | ICD-10-CM

## 2022-02-10 DIAGNOSIS — N3000 Acute cystitis without hematuria: Secondary | ICD-10-CM

## 2022-02-10 DIAGNOSIS — R0600 Dyspnea, unspecified: Secondary | ICD-10-CM | POA: Diagnosis not present

## 2022-02-10 DIAGNOSIS — R42 Dizziness and giddiness: Secondary | ICD-10-CM | POA: Diagnosis not present

## 2022-02-10 DIAGNOSIS — J449 Chronic obstructive pulmonary disease, unspecified: Secondary | ICD-10-CM

## 2022-02-10 DIAGNOSIS — N400 Enlarged prostate without lower urinary tract symptoms: Secondary | ICD-10-CM

## 2022-02-10 DIAGNOSIS — M6282 Rhabdomyolysis: Secondary | ICD-10-CM

## 2022-02-10 DIAGNOSIS — R600 Localized edema: Secondary | ICD-10-CM

## 2022-02-10 DIAGNOSIS — R531 Weakness: Secondary | ICD-10-CM

## 2022-02-10 LAB — CULTURE, BLOOD (ROUTINE X 2)
Culture: NO GROWTH
Culture: NO GROWTH
Special Requests: ADEQUATE
Special Requests: ADEQUATE

## 2022-02-10 NOTE — Progress Notes (Signed)
?Established patient visit ? ? ?Patient: Warren Wagner.   DOB: [birth year]   87 y.o. Male  MRN: [id number] ?Visit Date: 02/10/2022 ? ?Chief Complaint  ?Patient presents with  ? Hospitalization Follow-up  ?   ?  ? ?Subjective  ?  ?HPI ?HPI   ? ? Hospitalization Follow-up   ? Additional comments:  ? ? ?  ?  ?Last edited by Sylvester Harder, CMA on 02/10/2022  9:01 AM.  ?  ?  ?Patient presents for hospital follow-up. Patient is accompanied by both daughters. Patient's daughter reports he will have PT evaluation this week. Per patient's daughter he seems to be weak and gets short of breath with light activity. Reports patient has stated he is having trouble sleeping because feels short of breath. Patient is on 4 L of oxygen. Still has lower extremity swelling.  Appetite progressively coming back. Patient seems to have transient confusion but not the same as when he went to the hospital. No dysuria, hematuria or urinary frequency. ? ?Discharge summary: ?Admit date:     02/02/2022  ?Discharge date: 02/05/22  ?Discharge Physician: Lewie Chamber  ?  ?PCP: Warren Masker, PA-C  ?  ?Recommendations at discharge:  ?  ?Follow-up with cardiology.  Repeat echo recommended after approximately 6 months ?  ?Discharge Diagnoses: ?Principal Problem: ?  Postural dizziness with presyncope ?Active Problems: ?  Rhabdomyolysis ?  UTI (urinary tract infection) ?  Acute respiratory failure with hypoxia (HCC) ?  Essential hypertension ?  Bilateral leg edema ?  Atrial fibrillation (HCC) ?  Lymphedema ?  Benign prostatic hyperplasia ?  COPD (chronic obstructive pulmonary disease) (HCC) ?  Constipation ?  Aortic stenosis ?  ?Resolved Problems: ?  Acute metabolic encephalopathy ?  ?Hospital Course: ?[name] is an 87 yo male with PMH afib, HTN, BPH, hard of hearing who presented to the hospital with worsening weakness and confusion per family.  ?Further collateral information after admission was obtained and patient was noted to have  sat in the car on Tuesday for approximately 2 hours as he did not wish to get out for an event that family was going to. The windows were open but he did appear to have sweat some during this.  Throughout the rest of the week he became progressively more weak and confused. ?Work-up on admission revealed elevated CK, 1747; urinalysis was suggestive of UTI. ?Abdominal x-ray was concerning for possible ileus or obstruction.  CT abdomen/pelvis ruled out any underlying obstruction and showed moderate stool burden. ?CT head is unremarkable for acute findings but revealed underlying chronic small vessel disease and brain atrophy.  Questionable periapical lucency involving left upper tooth which could not rule out periapical abscess. ?He was started on antibiotics, fluids, and admitted for further work-up and monitoring. ?See below for further plan  ?  ?Assessment and Plan: ?* Postural dizziness with presyncope ?- etiology possibly from dehydration/rhabdo vs orthostasis vs cardiogenic ?- follow up echo (does shows severe AS, pulmonic valve regurg is mod/severe; findings appear progressed from 2021 echo) ?-Evaluated by cardiology inpatient, greatly appreciate assistance.  Not considered candidate for any intervention currently during this hospitalization and recommended for outpatient follow-up for repeat echo in about 6 months and follow-up with cardiology ?  ?Rhabdomyolysis ?- initial CK 1747 ?-Down trended to 567 at discharge ?-Responded well to fluids ?  ?Acute metabolic encephalopathy-resolved as of 02/04/2022 ?- Likely multifactorial in setting of underlying presumed UTI as well as probable dehydration ?-Treated with fluids ?-Mentation improved back  to baseline prior to discharge ?  ?Acute respiratory failure with hypoxia (HCC) ?- Possibly due to underlying atelectasis and fluids administered for rhabdomyolysis ?- Still had desaturation with ambulatory walk test prior to discharge and arranged for home oxygen ?  ?UTI  (urinary tract infection) ?- UA noted with moderate LE, negative nitrite, greater than 50 WBC, many bacteria on admission with altered mentation concerning for UTI ?- Antibiotics were transitioned to Rocephin and urine culture growing gram-negative rods which ultimately speciated to ampicillin resistant Proteus.  He completed 3 days of Rocephin during hospitalization however discussed with pharmacy and high chance of possible Rocephin resistance/ESBL organism.  Patient treated with one-time dose of fosfomycin prior to discharge to complete course ?  ?Essential hypertension ?- Home regimen resumed ?- Caution with any further presyncope or hypotension ?  ?Bilateral leg edema ?- echo does show some worsening AS and PV regurg ?-Treated with albumin and IV fluids during hospitalization ?  ?Atrial fibrillation (HCC) ?- Rate currently controlled ?- On Coumadin at home.  Continue Coumadin per pharmacy ?-Patient instructed to resume Coumadin on 02/07/2022 along with INR check ?  ?Benign prostatic hyperplasia ?- Continue finasteride ?  ?Aortic stenosis ?- see presyncope workup ?- outpatient follow up with cardiology for repeat echo ?  ?Constipation ?- Noted on imaging ?- Continue bowel regimen.  Achieved adequate bowel movements prior to discharge ?  ?COPD (chronic obstructive pulmonary disease) (HCC) ?- CXR negative for acute findings  ?  ?  ?Consultants: Cardiology ?Procedures performed:   ?Disposition: Home health ? ? ? ? ? ? ? ?Medications: ?Outpatient Medications Prior to Visit  ?Medication Sig  ? acetaminophen (TYLENOL) 500 MG tablet Take 1,000 mg by mouth every 6 (six) hours as needed for moderate pain or headache.  ? calcium-vitamin D (OSCAL WITH D) 500-5 MG-MCG tablet Take 1 tablet by mouth daily with breakfast.  ? cetirizine (ZYRTEC) 10 MG tablet Take 1 tablet (10 mg total) by mouth daily.  ? docusate sodium (COLACE) 100 MG capsule Take 100 mg by mouth at bedtime as needed for mild constipation.  ? finasteride  (PROSCAR) 5 MG tablet Take 1 tablet (5 mg total) by mouth daily.  ? fish oil-omega-3 fatty acids 1000 MG capsule Take 1 g by mouth daily.  ? furosemide (LASIX) 40 MG tablet Take 1 tablet (40 mg total) by mouth daily.  ? melatonin 5 MG TABS Take 5-10 mg by mouth at bedtime as needed (sleep).  ? metoprolol succinate (TOPROL-XL) 50 MG 24 hr tablet Take 1 tablet (50 mg total) by mouth daily. Take with or immediately following a meal.  ? Multiple Vitamins-Minerals (MULTIVITAMIN WITH MINERALS) tablet Take 1 tablet by mouth daily.  ? Multiple Vitamins-Minerals (OCUVITE PO) Take 1 tablet by mouth daily.  ? sodium chloride (OCEAN) 0.65 % SOLN nasal spray Place 1 spray into both nostrils as needed for congestion.  ? traZODone (DESYREL) 50 MG tablet Take 0.5-1 tablets (25-50 mg total) by mouth at bedtime as needed for sleep. (Patient taking differently: Take 50 mg by mouth at bedtime.)  ? warfarin (COUMADIN) 5 MG tablet TAKE 1 TO 2 TABLETS BY MOUTH ONCE DAILY AS  DIRECTED (Patient taking differently: Take 2.5-5 mg by mouth See admin instructions. 2.5 mg on Monday, Wednesday, Friday ?5 mg  on Tuesday, Tuesday, Thursday,Saturday and sunday)  ? ?No facility-administered medications prior to visit.  ? ? ?Review of Systems ?Review of Systems:  ?A fourteen system review of systems was performed and found to be positive  as per HPI. ? ? ?Last CBC ?Lab Results  ?Component Value Date  ? WBC 9.6 02/05/2022  ? HGB 12.2 (L) 02/05/2022  ? HCT 36.4 (L) 02/05/2022  ? MCV 89.9 02/05/2022  ? MCH 30.1 02/05/2022  ? RDW 13.3 02/05/2022  ? PLT 121 (L) 02/05/2022  ? ?Last metabolic panel ?Lab Results  ?Component Value Date  ? GLUCOSE 126 (H) 02/05/2022  ? NA 140 02/05/2022  ? K 3.7 02/05/2022  ? CL 109 02/05/2022  ? CO2 22 02/05/2022  ? BUN 23 02/05/2022  ? CREATININE 1.19 02/05/2022  ? GFRNONAA >60 02/05/2022  ? CALCIUM 9.1 02/05/2022  ? PHOS 3.6 02/02/2022  ? PROT 6.3 (L) 02/05/2022  ? ALBUMIN 3.8 02/05/2022  ? LABGLOB 2.7 01/06/2022  ? AGRATIO  1.5 01/06/2022  ? BILITOT 1.5 (H) 02/05/2022  ? ALKPHOS 37 (L) 02/05/2022  ? AST 78 (H) 02/05/2022  ? ALT 34 02/05/2022  ? ANIONGAP 9 02/05/2022  ? ?Last lipids ?Lab Results  ?Component Value Date  ? CHOL 195 03/

## 2022-02-11 LAB — CBC WITH DIFFERENTIAL/PLATELET
Basophils Absolute: 0.1 10*3/uL (ref 0.0–0.2)
Basos: 1 %
EOS (ABSOLUTE): 0.4 10*3/uL (ref 0.0–0.4)
Eos: 4 %
Hematocrit: 41.3 % (ref 37.5–51.0)
Hemoglobin: 13.9 g/dL (ref 13.0–17.7)
Immature Grans (Abs): 0.1 10*3/uL (ref 0.0–0.1)
Immature Granulocytes: 1 %
Lymphocytes Absolute: 1.5 10*3/uL (ref 0.7–3.1)
Lymphs: 16 %
MCH: 29.6 pg (ref 26.6–33.0)
MCHC: 33.7 g/dL (ref 31.5–35.7)
MCV: 88 fL (ref 79–97)
Monocytes Absolute: 0.7 10*3/uL (ref 0.1–0.9)
Monocytes: 7 %
Neutrophils Absolute: 6.7 10*3/uL (ref 1.4–7.0)
Neutrophils: 71 %
Platelets: 280 10*3/uL (ref 150–450)
RBC: 4.69 x10E6/uL (ref 4.14–5.80)
RDW: 13.1 % (ref 11.6–15.4)
WBC: 9.4 10*3/uL (ref 3.4–10.8)

## 2022-02-11 LAB — COMPREHENSIVE METABOLIC PANEL
ALT: 60 IU/L — ABNORMAL HIGH (ref 0–44)
AST: 56 IU/L — ABNORMAL HIGH (ref 0–40)
Albumin/Globulin Ratio: 1.8 (ref 1.2–2.2)
Albumin: 4.1 g/dL (ref 3.6–4.6)
Alkaline Phosphatase: 65 IU/L (ref 44–121)
BUN/Creatinine Ratio: 21 (ref 10–24)
BUN: 22 mg/dL (ref 8–27)
Bilirubin Total: 0.7 mg/dL (ref 0.0–1.2)
CO2: 26 mmol/L (ref 20–29)
Calcium: 9.2 mg/dL (ref 8.6–10.2)
Chloride: 103 mmol/L (ref 96–106)
Creatinine, Ser: 1.05 mg/dL (ref 0.76–1.27)
Globulin, Total: 2.3 g/dL (ref 1.5–4.5)
Glucose: 104 mg/dL — ABNORMAL HIGH (ref 70–99)
Potassium: 4 mmol/L (ref 3.5–5.2)
Sodium: 145 mmol/L — ABNORMAL HIGH (ref 134–144)
Total Protein: 6.4 g/dL (ref 6.0–8.5)
eGFR: 70 mL/min/{1.73_m2} (ref >59)

## 2022-02-11 LAB — BRAIN NATRIURETIC PEPTIDE: BNP: 260.3 pg/mL — ABNORMAL HIGH (ref 0.0–100.0)

## 2022-02-13 DIAGNOSIS — I7789 Other specified disorders of arteries and arterioles: Secondary | ICD-10-CM | POA: Insufficient documentation

## 2022-02-13 NOTE — Progress Notes (Signed)
?  ?Cardiology Office Note ? ? ?Date:  02/14/2022  ? ?ID:  [name], DOB [birth year], MRN [id number] ? ?PCP:  Lorrene Reid, PA-C  ?Cardiologist:   Minus Breeding, MD ?Referring:  Self ? ?Chief Complaint  ?Patient presents with  ? Edema  ? ? ?  ?History of Present Illness: ?Cassius Kable. is a 87 y.o. male who presents for evaluation of lower extremity swelling.  He has atrial fib and has been on warfarin.   At the last visit I ordered an echo.  LV function is OK.   There were no significant valvular abnormalities.  He has aortic root enlargement.    ? ?He was in the hospital in April with dizziness and orthostatic hypotension.    He had rhabdo and dehydration.  He had a UTI.  He was found to have severe AS.   This had progressed compared with previous studies.  Marland Kitchen  He is here with both his daughters today.  He is not as confused since going home.  He does probably have a little extra swelling.  His weight has been 231 and steady over 3 readings but they did not get one  the day he was discharged.  He is on home oxygen.  He sleeps on the couch for the most part.  They did get him a lift chair so his feet are elevated more.  He is not having any new PND or orthopnea.  He is not having any new confusion.  He has no new fevers or chills.  He is not describing any chest pressure, neck or arm discomfort.    ? ?Past Medical History:  ?Diagnosis Date  ? Atrial fibrillation ([place])   ? Hearing loss   ? Hypertension   ? Prostate enlargement   ? Umbilical hernia [id number]  ? ? ?Past Surgical History:  ?Procedure Laterality Date  ? CARDIOVERSION  10/10/2006  ? cataracts Bilateral   ? HERNIA REPAIR  [id number]  ? large umbilical hernia  ? INSERTION OF MESH  09/10/2012  ? Procedure: INSERTION OF MESH;  Surgeon: Zenovia Jarred, MD;  Location: [facility];  Service: General;  Laterality: N/A;  ? macular degeneration Bilateral   ? TONSILLECTOMY    ? "maybe" (09/10/2012)  ? UMBILICAL HERNIA REPAIR  09/10/2012  ?  Procedure: HERNIA REPAIR UMBILICAL ADULT;  Surgeon: Zenovia Jarred, MD;  Location: [facility];  Service: General;  Laterality: N/A;  ? ? ? ?Current Outpatient Medications  ?Medication Sig Dispense Refill  ? acetaminophen (TYLENOL) 500 MG tablet Take 1,000 mg by mouth every 6 (six) hours as needed for moderate pain or headache.    ? calcium-vitamin D (OSCAL WITH D) 500-5 MG-MCG tablet Take 1 tablet by mouth daily with breakfast.    ? cetirizine (ZYRTEC) 10 MG tablet Take 1 tablet (10 mg total) by mouth daily. 90 tablet 1  ? docusate sodium (COLACE) 100 MG capsule Take 100 mg by mouth at bedtime as needed for mild constipation.    ? finasteride (PROSCAR) 5 MG tablet Take 1 tablet (5 mg total) by mouth daily. 90 tablet 1  ? fish oil-omega-3 fatty acids 1000 MG capsule Take 1 g by mouth daily.    ? furosemide (LASIX) 40 MG tablet Take 1 tablet (40 mg total) by mouth daily. 90 tablet 3  ? melatonin 5 MG TABS Take 5-10 mg by mouth at bedtime as needed (sleep).    ? metoprolol succinate (TOPROL-XL) 50 MG 24 hr  tablet Take 1 tablet (50 mg total) by mouth daily. Take with or immediately following a meal. 90 tablet 1  ? Multiple Vitamins-Minerals (MULTIVITAMIN WITH MINERALS) tablet Take 1 tablet by mouth daily.    ? Multiple Vitamins-Minerals (OCUVITE PO) Take 1 tablet by mouth daily.    ? sodium chloride (OCEAN) 0.65 % SOLN nasal spray Place 1 spray into both nostrils as needed for congestion.    ? traZODone (DESYREL) 50 MG tablet Take 0.5-1 tablets (25-50 mg total) by mouth at bedtime as needed for sleep. (Patient taking differently: Take 50 mg by mouth at bedtime.) 30 tablet 1  ? warfarin (COUMADIN) 5 MG tablet TAKE 1 TO 2 TABLETS BY MOUTH ONCE DAILY AS  DIRECTED (Patient taking differently: Take 2.5-5 mg by mouth See admin instructions. 2.5 mg on Monday, Wednesday, Friday ?5 mg  on Tuesday, Tuesday, Thursday,Saturday and sunday) 85 tablet 0  ? ?No current facility-administered medications for this visit.  ? ? ?Allergies:    Patient has no known allergies.  ? ? ?ROS:  Please see the history of present illness.   Otherwise, review of systems are positive for none.   All other systems are reviewed and negative.  ? ? ?PHYSICAL EXAM: ?VS:  BP 123/82   Pulse 65   Ht 6' (1.829 m)   Wt 233 lb (105.7 kg)   SpO2 99%   BMI 31.60 kg/m?  , BMI Body mass index is 31.6 kg/m?. ?GENERAL:  Well appearing ?NECK:  No jugular venous distention, waveform within normal limits, carotid upstroke brisk and symmetric, no bruits, no thyromegaly ?LUNGS:  Clear to auscultation bilaterally ?CHEST:  Unremarkable ?HEART:  PMI not displaced or sustained,S1 and S2 within normal limits, no S3, no clicks, no rubs, 2 out of 6 apical systolic murmur radiating slightly at the aortic outflow tract, no diastolic murmurs, irregular ?ABD:  Flat, positive bowel sounds normal in frequency in pitch, no bruits, no rebound, no guarding, no midline pulsatile mass, no hepatomegaly, no splenomegaly ?EXT:  2 plus pulses throughout, moderate left greater than right leg edema with chronic venous stasis changes edema, no cyanosis no clubbing ? ? ? ? ?EKG:  EKG is  ordered today. ?The ekg ordered today demonstrates atrial fibrillation, rate 77, axis rightward, intervals within normal limits, premature ventricular contraction.  No change from previous ? ?Recent Labs: ?02/03/2022: TSH 1.860 ?02/05/2022: Magnesium 2.0 ?02/10/2022: ALT 60; BNP 260.3; BUN 22; Creatinine, Ser 1.05; Hemoglobin 13.9; Platelets 280; Potassium 4.0; Sodium 145  ? ? ?Lipid Panel ?   ?Component Value Date/Time  ? CHOL 195 01/06/2022 0909  ? TRIG 82 01/06/2022 0909  ? HDL 54 01/06/2022 0909  ? CHOLHDL 3.6 01/06/2022 0909  ? [place] 126 (H) 01/06/2022 0909  ? ?  ? ?Wt Readings from Last 3 Encounters:  ?02/14/22 233 lb (105.7 kg)  ?02/10/22 224 lb (101.6 kg)  ?02/04/22 224 lb 10.4 oz (101.9 kg)  ?  ?ECHO: ? ?1. The aortic valve is abnormal. There is severe calcifcation of the  ?aortic valve with severely reduced cusp  excursion. Aortic valve  ?regurgitation is not visualized. Severe aortic valve stenosis. Aortic  ?valve area, by VTI measures 0.82 cm?Marland Kitchen Aortic valve  ? mean gradient measures 34.5 mmHg. Aortic valve Vmax measures 3.54 m/s.  ?Dimensionless index 0.22. Gradient and Vmax likely understimated due to  ?suboptimal Doppler alignment. Turbulent flow noted across valve in  ?systole. In setting of syncope, consider  ?severe AS as possible etiology.  ? 2. Left ventricular  ejection fraction, by estimation, is 55 to 60%. The  ?left ventricle has normal function. The left ventricle has no regional  ?wall motion abnormalities. There is mild left ventricular hypertrophy.  ?Left ventricular diastolic parameters  ?are indeterminate.  ? 3. Right ventricular systolic function is mildly reduced. The right  ?ventricular size is normal. There is moderately elevated pulmonary artery  ?systolic pressure. The estimated right ventricular systolic pressure is  ?0000000 mmHg.  ? 4. Left atrial size was severely dilated.  ? 5. Right atrial size was mildly dilated.  ? 6. The mitral valve is grossly normal. Moderate mitral valve  ?regurgitation. No evidence of mitral stenosis.  ? 7. Pulmonic valve regurgitation is moderate to severe.  ? 8. Aortic dilatation noted. There is mild dilatation of the aortic root,  ?measuring 40 mm. There is mild dilatation of the ascending aorta,  ?measuring 41 mm.  ? 9. The inferior vena cava is dilated in size with <50% respiratory  ?variability, suggesting right atrial pressure of 15 mmHg.  ? ?Other studies Reviewed: ?Additional studies/ records that were reviewed today include: Hospital records. ?Review of the above records demonstrates:  Please see elsewhere in the note.   ? ? ?ASSESSMENT AND PLAN: ? ?CHRONIC ATRIAL FIB:    Mr. Asah Ramsier. has a CHA2DS2 - VASc score of 4.  He tolerates anticoagulation. ? ?LEG SWELLING:    We had a long discussion about this.  Most of this is protein calorie malnutrition I  would think.  There is some coarse diastolic heart failure.  We talked about salt and fluid restriction and keeping his feet elevated.  They are going to put compression stockings on.  If he has any worsening of this he

## 2022-02-14 ENCOUNTER — Ambulatory Visit: Payer: Medicare Other | Admitting: Cardiology

## 2022-02-14 ENCOUNTER — Ambulatory Visit (INDEPENDENT_AMBULATORY_CARE_PROVIDER_SITE_OTHER): Payer: Medicare Other

## 2022-02-14 ENCOUNTER — Telehealth: Payer: Self-pay | Admitting: Physician Assistant

## 2022-02-14 ENCOUNTER — Encounter: Payer: Self-pay | Admitting: Cardiology

## 2022-02-14 ENCOUNTER — Encounter: Payer: Self-pay | Admitting: Physician Assistant

## 2022-02-14 VITALS — BP 123/82 | HR 65 | Ht 72.0 in | Wt 233.0 lb

## 2022-02-14 DIAGNOSIS — I350 Nonrheumatic aortic (valve) stenosis: Secondary | ICD-10-CM | POA: Diagnosis not present

## 2022-02-14 DIAGNOSIS — I7789 Other specified disorders of arteries and arterioles: Secondary | ICD-10-CM

## 2022-02-14 DIAGNOSIS — I4891 Unspecified atrial fibrillation: Secondary | ICD-10-CM | POA: Diagnosis not present

## 2022-02-14 DIAGNOSIS — Z5181 Encounter for therapeutic drug level monitoring: Secondary | ICD-10-CM

## 2022-02-14 DIAGNOSIS — R413 Other amnesia: Secondary | ICD-10-CM

## 2022-02-14 DIAGNOSIS — Z7901 Long term (current) use of anticoagulants: Secondary | ICD-10-CM

## 2022-02-14 DIAGNOSIS — I482 Chronic atrial fibrillation, unspecified: Secondary | ICD-10-CM | POA: Diagnosis not present

## 2022-02-14 LAB — POCT INR: INR: 4 — AB (ref 2.0–3.0)

## 2022-02-14 NOTE — Telephone Encounter (Signed)
Patient is scheduled for a CT scan on Friday however his [facility] requires a PA. [facility] stated if it wasn't complete by Thursday at noon they would have to cancel the appointment. Please call [facility] when complete [phone] ?

## 2022-02-14 NOTE — Patient Instructions (Signed)
HOLD today and then continue taking 1 tablet daily except 0.5 tablet on Monday, Wednesday and Friday. Eat greens today ?Stay consistent with greens (1-2 times per week)  ?Recheck INR 1 week.  ?[facility] [phone] ?

## 2022-02-14 NOTE — Telephone Encounter (Signed)
PA obtained. AS, CMA ?

## 2022-02-14 NOTE — Patient Instructions (Signed)
Medication Instructions:  ?INCREASE to Lasix 80 mg daily for 3 days, then resume Lasix 40 mg daily  ?*If you need a refill on your cardiac medications before your next appointment, please call your pharmacy* ? ?Lab Work: ?NONE ordered at this time of appointment  ? ?If you have labs (blood work) drawn today and your tests are completely normal, you will receive your results only by: ?MyChart Message (if you have MyChart) OR ?A paper copy in the mail ?If you have any lab test that is abnormal or we need to change your treatment, we will call you to review the results. ? ?Testing/Procedures: ?NONE ordered at this time of appointment  ? ?Follow-Up: ?At [facility], you and your health needs are our priority.  As part of our continuing mission to provide you with exceptional heart care, we have created designated Provider Care Teams.  These Care Teams include your primary Cardiologist (physician) and Advanced Practice Providers (APPs -  Physician Assistants and Nurse Practitioners) who all work together to provide you with the care you need, when you need it. ?  ?Your next appointment:   ?1 month(s) ? ?The format for your next appointment:   ?In Person ? ?Provider:   ?APP      ? ? ?Other Instructions ? ? ? ?Important Information About Sugar ? ? ? ? ? ? ?

## 2022-02-18 ENCOUNTER — Ambulatory Visit
Admission: RE | Admit: 2022-02-18 | Discharge: 2022-02-18 | Disposition: A | Discharge status: Home / Self Care | Payer: Medicare Other | Source: Ambulatory Visit | Attending: Physician Assistant | Admitting: Physician Assistant

## 2022-02-18 DIAGNOSIS — J90 Pleural effusion, not elsewhere classified: Secondary | ICD-10-CM | POA: Diagnosis not present

## 2022-02-18 DIAGNOSIS — I7121 Aneurysm of the ascending aorta, without rupture: Secondary | ICD-10-CM | POA: Diagnosis not present

## 2022-02-18 DIAGNOSIS — R0600 Dyspnea, unspecified: Secondary | ICD-10-CM

## 2022-02-18 DIAGNOSIS — J449 Chronic obstructive pulmonary disease, unspecified: Secondary | ICD-10-CM

## 2022-02-22 ENCOUNTER — Ambulatory Visit (INDEPENDENT_AMBULATORY_CARE_PROVIDER_SITE_OTHER): Payer: Medicare Other

## 2022-02-22 ENCOUNTER — Other Ambulatory Visit: Payer: Self-pay | Admitting: Physician Assistant

## 2022-02-22 DIAGNOSIS — Z7901 Long term (current) use of anticoagulants: Secondary | ICD-10-CM | POA: Diagnosis not present

## 2022-02-22 DIAGNOSIS — J449 Chronic obstructive pulmonary disease, unspecified: Secondary | ICD-10-CM

## 2022-02-22 DIAGNOSIS — Z5181 Encounter for therapeutic drug level monitoring: Secondary | ICD-10-CM | POA: Diagnosis not present

## 2022-02-22 DIAGNOSIS — I4891 Unspecified atrial fibrillation: Secondary | ICD-10-CM

## 2022-02-22 LAB — POCT INR: INR: 2.8 (ref 2.0–3.0)

## 2022-02-22 MED ORDER — TRELEGY ELLIPTA 100-62.5-25 MCG/ACT IN AEPB: 1.0000 | INHALATION_SPRAY | Freq: Every day | RESPIRATORY_TRACT | 2 refills | Status: DC

## 2022-02-22 NOTE — Patient Instructions (Signed)
Description   Continue taking 1 tablet daily except 0.5 tablet on Monday, Wednesday and Friday.  Stay consistent with greens (2 times per week)  Recheck INR 2 weeks  [facility] #[phone]      

## 2022-03-04 ENCOUNTER — Other Ambulatory Visit: Payer: Self-pay | Admitting: Physician Assistant

## 2022-03-04 DIAGNOSIS — G4700 Insomnia, unspecified: Secondary | ICD-10-CM

## 2022-03-07 DIAGNOSIS — J9601 Acute respiratory failure with hypoxia: Secondary | ICD-10-CM | POA: Diagnosis not present

## 2022-03-07 DIAGNOSIS — J3089 Other allergic rhinitis: Secondary | ICD-10-CM | POA: Diagnosis not present

## 2022-03-08 ENCOUNTER — Ambulatory Visit: Payer: Medicare Other

## 2022-03-08 DIAGNOSIS — Z5181 Encounter for therapeutic drug level monitoring: Secondary | ICD-10-CM | POA: Diagnosis not present

## 2022-03-08 DIAGNOSIS — I4891 Unspecified atrial fibrillation: Secondary | ICD-10-CM | POA: Diagnosis not present

## 2022-03-08 DIAGNOSIS — Z7901 Long term (current) use of anticoagulants: Secondary | ICD-10-CM | POA: Diagnosis not present

## 2022-03-08 LAB — POCT INR: INR: 2.2 (ref 2.0–3.0)

## 2022-03-08 NOTE — Patient Instructions (Signed)
Description   Continue taking 1 tablet daily except 0.5 tablet on Monday, Wednesday and Friday.  Stay consistent with greens (2 times per week)  Recheck INR 3 weeks.  [facility] Clinic #[phone]      

## 2022-03-11 DIAGNOSIS — B964 Proteus (mirabilis) (morganii) as the cause of diseases classified elsewhere: Secondary | ICD-10-CM | POA: Diagnosis not present

## 2022-03-11 DIAGNOSIS — N182 Chronic kidney disease, stage 2 (mild): Secondary | ICD-10-CM | POA: Diagnosis not present

## 2022-03-11 DIAGNOSIS — I890 Lymphedema, not elsewhere classified: Secondary | ICD-10-CM | POA: Diagnosis not present

## 2022-03-11 DIAGNOSIS — Z1611 Resistance to penicillins: Secondary | ICD-10-CM | POA: Diagnosis not present

## 2022-03-11 DIAGNOSIS — I129 Hypertensive chronic kidney disease with stage 1 through stage 4 chronic kidney disease, or unspecified chronic kidney disease: Secondary | ICD-10-CM | POA: Diagnosis not present

## 2022-03-11 DIAGNOSIS — K5900 Constipation, unspecified: Secondary | ICD-10-CM | POA: Diagnosis not present

## 2022-03-11 DIAGNOSIS — I878 Other specified disorders of veins: Secondary | ICD-10-CM | POA: Diagnosis not present

## 2022-03-11 DIAGNOSIS — I350 Nonrheumatic aortic (valve) stenosis: Secondary | ICD-10-CM | POA: Diagnosis not present

## 2022-03-11 DIAGNOSIS — I4891 Unspecified atrial fibrillation: Secondary | ICD-10-CM | POA: Diagnosis not present

## 2022-03-11 DIAGNOSIS — J441 Chronic obstructive pulmonary disease with (acute) exacerbation: Secondary | ICD-10-CM | POA: Diagnosis not present

## 2022-03-11 DIAGNOSIS — J9601 Acute respiratory failure with hypoxia: Secondary | ICD-10-CM | POA: Diagnosis not present

## 2022-03-11 DIAGNOSIS — N400 Enlarged prostate without lower urinary tract symptoms: Secondary | ICD-10-CM | POA: Diagnosis not present

## 2022-03-11 DIAGNOSIS — N390 Urinary tract infection, site not specified: Secondary | ICD-10-CM | POA: Diagnosis not present

## 2022-03-11 DIAGNOSIS — H9190 Unspecified hearing loss, unspecified ear: Secondary | ICD-10-CM | POA: Diagnosis not present

## 2022-03-11 DIAGNOSIS — M6282 Rhabdomyolysis: Secondary | ICD-10-CM | POA: Diagnosis not present

## 2022-03-11 DIAGNOSIS — I371 Nonrheumatic pulmonary valve insufficiency: Secondary | ICD-10-CM | POA: Diagnosis not present

## 2022-03-15 NOTE — Progress Notes (Signed)
Cardiology Office Note:    Date:  03/17/2022   ID:  Warren Leas., DOB [birth year], MRN [id number]  PCP:  Warren Wagner, [place] Cardiologist: Minus Breeding, MD   Reason for visit: 1 month follow-up  History of Present Illness:    Warren Wadding. is a 87 y.o. male with a hx of A-fib, aortic stenosis, aortic root enlargement, orthostatic hypotension, COPD, ADLs limited in fairly chair bound.  He saw Dr. Percival Spanish on Feb 14, 2022 for follow-up post hospital mission in April.  He was admitted with dizziness and orthostatic hypotension with rhabdomyolysis, UTI and dehydration.  Cardiology consult was consulted.  Patient thought did not have a syncopal event but rather weak and collapsed.  Thought more likely due to infection and rhabdo.  Given his poor performance status, surveillance of aortic stenosis recommended.  Patient's weight was stable on follow-up.  He had leg swelling thought secondary to protein calorie malnutrition.  Compression stockings recommended.  Recommended to take extra Lasix 40mg  for the 3 days.  Noted to have persistent dyspnea when he saw his PCP in follow-up.  CT of the chest showed diffuse mild bronchial wall thickening with mild bronchiectasis in the inferior bilateral lower lobes and inferolateral periphery of the lingula. Findings consistent with the clinical history of COPD.  He was started on a daily inhaler.  Today, patient is accompanied by his daughter.  They both live with his other daughter and her 38 year old.  Patient does not do too much activity.  He uses the restroom often and goes to the kitchen for a snack.  Patient typically sleeps on the couch.  He uses a walker.  Today, patient denies shortness of breath, chest pain and lightheadedness.  They have been checking his weight daily and his weight seems to be stable at 220 pounds.  When they saw Dr. Percival Spanish last month, his weight had gone up to 233 pounds -the additional  Lasix 40 mg for 3 days was able to get his extra fluid off.  Patient mentions brief fluttering last week.  To help resolve this he started exercising with arm weights.    He saw neurology yesterday and was diagnosed with dementia and started on Namenda.    Past Medical History:  Diagnosis Date   Atrial fibrillation ([place])    Hearing loss    Hypertension    Prostate enlargement    Umbilical hernia [id number]    Past Surgical History:  Procedure Laterality Date   CARDIOVERSION  10/10/2006   cataracts Bilateral    HERNIA REPAIR  [id number]   large umbilical hernia   INSERTION OF MESH  09/10/2012   Procedure: INSERTION OF MESH;  Surgeon: Zenovia Jarred, MD;  Location: [place];  Service: General;  Laterality: N/A;   macular degeneration Bilateral    TONSILLECTOMY     "maybe" (0000000)   UMBILICAL HERNIA REPAIR  09/10/2012   Procedure: HERNIA REPAIR UMBILICAL ADULT;  Surgeon: Zenovia Jarred, MD;  Location: [facility];  Service: General;  Laterality: N/A;    Current Medications: Current Meds  Medication Sig   acetaminophen (TYLENOL) 500 MG tablet Take 1,000 mg by mouth every 6 (six) hours as needed for moderate pain or headache.   calcium-vitamin D (OSCAL WITH D) 500-5 MG-MCG tablet Take 1 tablet by mouth daily with breakfast.   cetirizine (ZYRTEC) 10 MG tablet Take 1 tablet (10 mg total) by mouth daily.   docusate sodium (COLACE) 100 MG capsule Take 100  mg by mouth at bedtime as needed for mild constipation.   finasteride (PROSCAR) 5 MG tablet Take 1 tablet (5 mg total) by mouth daily.   fish oil-omega-3 fatty acids 1000 MG capsule Take 1 g by mouth daily.   Fluticasone-Umeclidin-Vilant (TRELEGY ELLIPTA) 100-62.5-25 MCG/ACT AEPB Inhale 1 puff into the lungs daily.   furosemide (LASIX) 40 MG tablet Take 1 tablet (40 mg total) by mouth daily.   melatonin 5 MG TABS Take 5-10 mg by mouth at bedtime as needed (sleep).   memantine (NAMENDA) 10 MG tablet Take 1 tablet (10 mg total) by mouth  2 (two) times daily.   metoprolol succinate (TOPROL-XL) 50 MG 24 hr tablet Take 1 tablet (50 mg total) by mouth daily. Take with or immediately following a meal.   Multiple Vitamins-Minerals (MULTIVITAMIN WITH MINERALS) tablet Take 1 tablet by mouth daily.   Multiple Vitamins-Minerals (OCUVITE PO) Take 1 tablet by mouth daily.   sodium chloride (OCEAN) 0.65 % SOLN nasal spray Place 1 spray into both nostrils as needed for congestion.   traZODone (DESYREL) 50 MG tablet TAKE 1/2 TO 1 (ONE-HALF TO ONE) TABLET BY MOUTH AT BEDTIME AS NEEDED FOR SLEEP   warfarin (COUMADIN) 5 MG tablet TAKE 1 TO 2 TABLETS BY MOUTH ONCE DAILY AS  DIRECTED (Patient taking differently: Take 2.5-5 mg by mouth See admin instructions. 2.5 mg on Monday, Wednesday, Friday 5 mg  on Tuesday, Tuesday, Thursday,Saturday and sunday)     Allergies:   Patient has no known allergies.   Social History   Socioeconomic History   Marital status: Widowed    Spouse name: Not on file   Number of children: Not on file   Years of education: Not on file   Highest education level: Not on file  Occupational History   Not on file  Tobacco Use   Smoking status: Former    Packs/day: 1.50    Years: 10.00    Total pack years: 15.00    Types: Cigarettes    Quit date: 07/27/1982    Years since quitting: 39.6   Smokeless tobacco: Never  Vaping Use   Vaping Use: Never used  Substance and Sexual Activity   Alcohol use: No   Drug use: No   Sexual activity: Not Currently    Birth control/protection: None  Other Topics Concern   Not on file  Social History Narrative   Lives with 2 daughters. Three children.    Social Determinants of Health   Financial Resource Strain: Not on file  Food Insecurity: Not on file  Transportation Needs: Not on file  Physical Activity: Not on file  Stress: Not on file  Social Connections: Not on file     Family History: The patient's family history includes Heart disease in his mother; Stroke in his  father.  ROS:   Please see the history of present illness.     EKGs/Labs/Other Studies Reviewed:    Recent Labs: 02/03/2022: TSH 1.860 02/05/2022: Magnesium 2.0 02/10/2022: ALT 60; BNP 260.3; BUN 22; Creatinine, Ser 1.05; Hemoglobin 13.9; Platelets 280; Potassium 4.0; Sodium 145   Recent Lipid Panel Lab Results  Component Value Date/Time   CHOL 195 01/06/2022 09:09 AM   TRIG 82 01/06/2022 09:09 AM   HDL 54 01/06/2022 09:09 AM   LDLCALC 126 (H) 01/06/2022 09:09 AM    Physical Exam:    VS:  BP 110/70 (BP Location: Left Arm)   Pulse 60   Ht 6' (1.829 m)   Wt 225  lb 6.4 oz (102.2 kg)   SpO2 99%   BMI 30.57 kg/m    No data found.  Wt Readings from Last 3 Encounters:  03/17/22 225 lb 6.4 oz (102.2 kg)  03/16/22 225 lb (102.1 kg)  02/14/22 233 lb (105.7 kg)     GEN:  Well nourished, well developed in no acute distress HEENT: Normal NECK: No JVD; No carotid bruits CARDIAC: Irregular regular, systolic murmur RESPIRATORY:  Clear to auscultation without rales, wheezing or rhonchi  ABDOMEN: Soft, non-tender, non-distended MUSCULOSKELETAL: Trace edema below the knees; No deformity  SKIN: Warm and dry NEUROLOGIC:  Alert and oriented PSYCHIATRIC:  Normal affect    ASSESSMENT AND PLAN   Chronic atrial fibrillation, rate controlled -Continue anticoagulation (coumadin, goal INR 2-3) for stroke prevention; INR 2.2 on 03/08/22 -Continue Toprol for rate control.  Lower extremity edema, improved -Salt restriction, leg elevation  -Continue Lasix 40 mg daily.  Gave instructions to use additional Lasix 40 mg if weight is >/= 223lbs for 1 to 3 days until weight/swelling are at baseline. -Recommend using compression stockings (has some but has not been using)  Aortic stenosis -2D echo 01/2022: Severe aortic calcification with severe aortic stenosis, AVA by VTI 0.8 cm, mean gradient 35 mmHg, V-max 3.5 m/s, DI 0.22.  Gradient and Vmax likely understimated due to suboptimal Doppler  alignment. -Repeat 2D echo in 6 months (November).   -Patient denies chest pain, shortness of breath, syncope.  Currently euvolemic.  Mitral regurgitation -Moderate mitral regurgitation on echo 01/2022 -Euvolemic on exam.  Aortic root enlargement -43 mm on CT in 2022 and 40 mm on echo -22mm on CT in 02/2022 -Given his frailty, conservative management preferred  Disposition - Follow-up in 5 months with Dr. Percival Spanish following echo.  Gave clear instructions about heart failure zones and when to take additional Lasix.  Told patient and his daughter not to hesitate to call if any issues prior to follow-up appointment.     Medication Adjustments/Labs and Tests Ordered: Current medicines are reviewed at length with the patient today.  Concerns regarding medicines are outlined above.  Orders Placed This Encounter  Procedures   ECHOCARDIOGRAM COMPLETE   No orders of the defined types were placed in this encounter.   Patient Instructions  Medication Instructions:  No Changes *If you need a refill on your cardiac medications before your next appointment, please call your pharmacy*   Lab Work: No Labs If you have labs (blood work) drawn today and your tests are completely normal, you will receive your results only by: [place] (if you have MyChart) OR A paper copy in the mail If you have any lab test that is abnormal or we need to change your treatment, we will call you to review the results.   Testing/Procedures: Your physician has requested that you have an echocardiogram. Echocardiography is a painless test that uses sound waves to create images of your heart. It provides your doctor with information about the size and shape of your heart and how well your heart's chambers and valves are working. This procedure takes approximately one hour. There are no restrictions for this procedure.    Follow-Up: At [facility], you and your health needs are our priority.  As part of our  continuing mission to provide you with exceptional heart care, we have created designated Provider Care Teams.  These Care Teams include your primary Cardiologist (physician) and Advanced Practice Providers (APPs -  Physician Assistants and Nurse Practitioners) who all work together  to provide you with the care you need, when you need it.  We recommend signing up for the patient portal called "MyChart".  Sign up information is provided on this After Visit Summary.  MyChart is used to connect with patients for Virtual Visits (Telemedicine).  Patients are able to view lab/test results, encounter notes, upcoming appointments, etc.  Non-urgent messages can be sent to your provider as well.   To learn more about what you can do with MyChart, go to [email or web address].    Your next appointment:   November 2023 After Echocardiogram.  The format for your next appointment:   In Person  Provider:   Minus Breeding, MD       Important Information About Sugar         Signed, Warren Lacy, PA-C  03/17/2022 10:00 AM    [place]

## 2022-03-16 ENCOUNTER — Ambulatory Visit (INDEPENDENT_AMBULATORY_CARE_PROVIDER_SITE_OTHER): Payer: Medicare Other | Admitting: Neurology

## 2022-03-16 ENCOUNTER — Encounter: Payer: Self-pay | Admitting: Neurology

## 2022-03-16 VITALS — BP 128/72 | HR 53 | Ht 72.0 in | Wt 225.0 lb

## 2022-03-16 DIAGNOSIS — F02B0 Dementia in other diseases classified elsewhere, moderate, without behavioral disturbance, psychotic disturbance, mood disturbance, and anxiety: Secondary | ICD-10-CM

## 2022-03-16 DIAGNOSIS — G301 Alzheimer's disease with late onset: Secondary | ICD-10-CM

## 2022-03-16 MED ORDER — MEMANTINE HCL 10 MG PO TABS: 10.0000 mg | ORAL_TABLET | Freq: Two times a day (BID) | ORAL | 11 refills | Status: DC

## 2022-03-16 NOTE — Progress Notes (Signed)
[facility]  PATIENT: [name] DOB: 1934-10-31  REQUESTING CLINICIAN: Lorrene Reid, PA-C HISTORY FROM: Patient and 2 daughters Theodora Blow and Judson Roch REASON FOR VISIT: Memory decline    HISTORICAL  CHIEF COMPLAINT:  Chief Complaint  Patient presents with   New Patient (Initial Visit)    Rm 13. Accompanied by daughters. NP Internal referral for memory changes.    HISTORY OF PRESENT ILLNESS:  This is a 87 year old gentleman past medical history of atrial fibrillation, aortic stenosis, hypertension, insomnia, BPH, and hearing loss who is presenting with daughters Theodora Blow and Judson Roch with concern of memory.  Patient reported his memory is not what he used to be, he is more forgetful than previously.  Currently he does live with his daughters after selling his house and need help with most activities of daily living.  Per daughters, patient is more forgetful, he does not talk that much but can get irritable if daughters are trying to have a conversation with him.  He was recently admitted to the hospital for UTI but since discharge his forgetfulness has worsened, sometimes he will forget to change his pull-up or forget to put his pans back on.  He needs reminder to take a shower, He has issue with taking his medications, sometime he will forget.  Daughters feel like they have to teach him the same thing over and over like how to get out of the car.  Family has to repeat himself themselves multiple times.  During conversation he can change topic frequently.  Daughters are helping managing his bill.  They reported agitation at time, that he is easily frustrated.  Denies any hallucinations.  Currently is not driving but he will get confused when going to familiar places.    TBI:   No past history of TBI Stroke:   no past history of stroke Seizures:   no past history of seizures Sleep:   no history of sleep apnea.   Mood:   patient denies anxiety and  depression  Functional status: Dependent in most ADLs and IADLs Patient lives with daughter  Cooking: no Cleaning: no Shopping: no Bathing: need help  Toileting: need help  Driving: no, last time was 1 year due to vision loss  Bills: Daughters   Ever left the stove on by accident?: no Forget how to use items around the house?: yes  Getting lost going to familiar places?: yes Forgetting loved ones names?: no  Word finding difficulty? Yes   Sleep: very poor, will sleep most of the day and up at night    OTHER MEDICAL CONDITIONS: Atrial fibrillation, aortic stenosis, hypertension, hearing loss, insomnia, BPH    REVIEW OF SYSTEMS: Full 14 system review of systems performed and negative with exception of: as noted in the HPI   ALLERGIES: No Known Allergies  HOME MEDICATIONS: Outpatient Medications Prior to Visit  Medication Sig Dispense Refill   acetaminophen (TYLENOL) 500 MG tablet Take 1,000 mg by mouth every 6 (six) hours as needed for moderate pain or headache.     calcium-vitamin D (OSCAL WITH D) 500-5 MG-MCG tablet Take 1 tablet by mouth daily with breakfast.     cetirizine (ZYRTEC) 10 MG tablet Take 1 tablet (10 mg total) by mouth daily. 90 tablet 1   docusate sodium (COLACE) 100 MG capsule Take 100 mg by mouth at bedtime as needed for mild constipation.     finasteride (PROSCAR) 5 MG tablet Take 1 tablet (5 mg total) by mouth daily. [place]  tablet 1   fish oil-omega-3 fatty acids 1000 MG capsule Take 1 g by mouth daily.     Fluticasone-Umeclidin-Vilant (TRELEGY ELLIPTA) 100-62.5-25 MCG/ACT AEPB Inhale 1 puff into the lungs daily. 1 each 2   furosemide (LASIX) 40 MG tablet Take 1 tablet (40 mg total) by mouth daily. 90 tablet 3   melatonin 5 MG TABS Take 5-10 mg by mouth at bedtime as needed (sleep).     metoprolol succinate (TOPROL-XL) 50 MG 24 hr tablet Take 1 tablet (50 mg total) by mouth daily. Take with or immediately following a meal. 90 tablet 1   Multiple  Vitamins-Minerals (MULTIVITAMIN WITH MINERALS) tablet Take 1 tablet by mouth daily.     Multiple Vitamins-Minerals (OCUVITE PO) Take 1 tablet by mouth daily.     sodium chloride (OCEAN) 0.65 % SOLN nasal spray Place 1 spray into both nostrils as needed for congestion.     traZODone (DESYREL) 50 MG tablet TAKE 1/2 TO 1 (ONE-HALF TO ONE) TABLET BY MOUTH AT BEDTIME AS NEEDED FOR SLEEP 30 tablet 0   warfarin (COUMADIN) 5 MG tablet TAKE 1 TO 2 TABLETS BY MOUTH ONCE DAILY AS  DIRECTED (Patient taking differently: Take 2.5-5 mg by mouth See admin instructions. 2.5 mg on Monday, Wednesday, Friday 5 mg  on Tuesday, Tuesday, Thursday,Saturday and sunday) 85 tablet 0   No facility-administered medications prior to visit.    PAST MEDICAL HISTORY: Past Medical History:  Diagnosis Date   Atrial fibrillation ([place])    Hearing loss    Hypertension    Prostate enlargement    Umbilical hernia [id number]    PAST SURGICAL HISTORY: Past Surgical History:  Procedure Laterality Date   CARDIOVERSION  10/10/2006   cataracts Bilateral    HERNIA REPAIR  [id number]   large umbilical hernia   INSERTION OF MESH  09/10/2012   Procedure: INSERTION OF MESH;  Surgeon: Zenovia Jarred, MD;  Location: [facility];  Service: General;  Laterality: N/A;   macular degeneration Bilateral    TONSILLECTOMY     "maybe" (0000000)   UMBILICAL HERNIA REPAIR  09/10/2012   Procedure: HERNIA REPAIR UMBILICAL ADULT;  Surgeon: Zenovia Jarred, MD;  Location: [facility];  Service: General;  Laterality: N/A;    FAMILY HISTORY: Family History  Problem Relation Age of Onset   Heart disease Mother    Stroke Father     SOCIAL HISTORY: Social History   Socioeconomic History   Marital status: Widowed    Spouse name: Not on file   Number of children: Not on file   Years of education: Not on file   Highest education level: Not on file  Occupational History   Not on file  Tobacco Use   Smoking status: Former    Packs/day: 1.50     Years: 10.00    Pack years: 15.00    Types: Cigarettes    Quit date: 07/27/1982    Years since quitting: 39.6   Smokeless tobacco: Never  Vaping Use   Vaping Use: Never used  Substance and Sexual Activity   Alcohol use: No   Drug use: No   Sexual activity: Not Currently    Birth control/protection: None  Other Topics Concern   Not on file  Social History Narrative   Lives with 2 daughters. Three children.    Social Determinants of Health   Financial Resource Strain: Not on file  Food Insecurity: Not on file  Transportation Needs: Not on file  Physical Activity: Not on  file  Stress: Not on file  Social Connections: Not on file  Intimate Partner Violence: Not on file    PHYSICAL EXAM  GENERAL EXAM/CONSTITUTIONAL: Vitals:  Vitals:   03/16/22 0940  BP: 128/72  Pulse: (!) 53  Weight: 225 lb (102.1 kg)  Height: 6' (1.829 m)   Body mass index is 30.52 kg/m. Wt Readings from Last 3 Encounters:  03/16/22 225 lb (102.1 kg)  02/14/22 233 lb (105.7 kg)  02/10/22 224 lb (101.6 kg)   Patient is in no distress; well developed, nourished and groomed; neck is supple  EYES: Pupils round and reactive to light, Visual fields full to confrontation, Extraocular movements intacts,   MUSCULOSKELETAL: Gait, strength, tone, movements noted in Neurologic exam below  NEUROLOGIC: MENTAL STATUS:      View : No data to display.             03/16/2022    9:43 AM  Montreal Cognitive Assessment   Visuospatial/ Executive (0/5) 3  Naming (0/3) 3  Attention: Read list of digits (0/2) 1  Attention: Read list of letters (0/1) 1  Attention: Serial 7 subtraction starting at 100 (0/3) 3  Language: Repeat phrase (0/2) 2  Language : Fluency (0/1) 0  Abstraction (0/2) 2  Delayed Recall (0/5) 0  Orientation (0/6) 4  Total 19  Adjusted Score (based on education) 19    CRANIAL NERVE:  2nd, 3rd, 4th, 6th - pupils equal and reactive to light, visual fields full to confrontation,  extraocular muscles intact, no nystagmus 5th - facial sensation symmetric 7th - facial strength symmetric 8th - hearing intact 9th - palate elevates symmetrically, uvula midline 11th - shoulder shrug symmetric 12th - tongue protrusion midline  MOTOR:  normal bulk and tone, full strength in the BUE, BLE  SENSORY:  normal and symmetric to light touch, vibration  COORDINATION:  finger-nose-finger  REFLEXES:  deep tendon reflexes present and symmetric  GAIT/STATION:  Walks with a cane, shuffling gait   DIAGNOSTIC DATA (LABS, IMAGING, TESTING) - I reviewed patient records, labs, notes, testing and imaging myself where available.  Lab Results  Component Value Date   WBC 9.4 02/10/2022   HGB 13.9 02/10/2022   HCT 41.3 02/10/2022   MCV 88 02/10/2022   PLT 280 02/10/2022      Component Value Date/Time   NA 145 (H) 02/10/2022 0929   K 4.0 02/10/2022 0929   CL 103 02/10/2022 0929   CO2 26 02/10/2022 0929   GLUCOSE 104 (H) 02/10/2022 0929   GLUCOSE 126 (H) 02/05/2022 0252   BUN 22 02/10/2022 0929   CREATININE 1.05 02/10/2022 0929   CALCIUM 9.2 02/10/2022 0929   PROT 6.4 02/10/2022 0929   ALBUMIN 4.1 02/10/2022 0929   AST 56 (H) 02/10/2022 0929   ALT 60 (H) 02/10/2022 0929   ALKPHOS 65 02/10/2022 0929   BILITOT 0.7 02/10/2022 0929   GFRNONAA >60 02/05/2022 0252   GFRAA 69 12/04/2019 1109   Lab Results  Component Value Date   CHOL 195 01/06/2022   HDL 54 01/06/2022   LDLCALC 126 (H) 01/06/2022   TRIG 82 01/06/2022   CHOLHDL 3.6 01/06/2022   Lab Results  Component Value Date   HGBA1C 5.6 01/06/2022   No results found for: [id number] Lab Results  Component Value Date   TSH 1.860 02/03/2022    CT Head 02/02/22 1. No acute intracranial abnormalities. 2. Chronic small vessel ischemic disease and brain atrophy. 3. Chronic left maxillary sinus inflammation. 4. Periapical lucency  involving a left upper tooth for which periapical abscess cannot be  excluded.   ASSESSMENT AND PLAN  87 y.o. year old male with history of atrial fibrillation, aortic stenosis, heart disease, BPH, hearing loss who is presenting with memory decline worse lately after his recent admission to the hospital for UTI.  Patient currently lives with both daughters and he needs help in most ADLs and IADLs.  He scored a 19 out of 30 on the MoCA indicative of memory impairment.  Based on history and exam patient likely has mild to moderate Alzheimer type dementia.  We discussed medication, he does have heart disease and today his heart rate was 53, we opted to start Namenda 10 mg twice daily, Aricept potentially is contraindicated due to bradycardia.  Advised patient to continue follow-up with primary care doctor and advised family to contact me if his symptoms get worse, mostly if there is additional behavioral changes.  Also advised them to look into getting power of attorney and getting a will.  They are comfortable with plans.  Follow-up in 1 year.  I will obtain a vitamin B12 level and set up home health services.   1. Moderate late onset Alzheimer's dementia, unspecified whether behavioral, psychotic, or mood disturbance or anxiety ([place])      Patient Instructions  Start Namenda 10 mg twice daily  Continue your other medications Follow up with your PCP and return in 1 year    There are well-accepted and sensible ways to reduce risk for Alzheimers disease and other degenerative brain disorders .  Exercise Daily Walk A daily 20 minute walk should be part of your routine. Disease related apathy can be a significant roadblock to exercise and the only way to overcome this is to make it a daily routine and perhaps have a reward at the end (something your loved one loves to eat or drink perhaps) or a personal trainer coming to the home can also be very useful. Most importantly, the patient is much more likely to exercise if the caregiver / spouse does it with him/her. In  general a structured, repetitive schedule is best.  General Health: Any diseases which effect your body will effect your brain such as a pneumonia, urinary infection, blood clot, heart attack or stroke. Keep contact with your primary care doctor for regular follow ups.  Sleep. A good nights sleep is healthy for the brain. Seven hours is recommended. If you have insomnia or poor sleep habits we can give you some instructions. If you have sleep apnea wear your mask.  Diet: Eating a heart healthy diet is also a good idea; fish and poultry instead of red meat, nuts (mostly non-peanuts), vegetables, fruits, olive oil or canola oil (instead of butter), minimal salt (use other spices to flavor foods), whole grain rice, bread, cereal and pasta and wine in moderation.Research is now showing that the MIND diet, which is a combination of The Mediterranean diet and the DASH diet, is beneficial for cognitive processing and longevity. Information about this diet can be found in The MIND Diet, a book by Doyne Keel, MS, RDN, and online at [employer]bookDistributors.si  Finances, Power of Attorney and Advance Directives: You should consider putting legal safeguards in place with regard to financial and medical decision making. While the spouse always has power of attorney for medical and financial issues in the absence of any form, you should consider what you want in case the spouse / caregiver is no longer around or capable of making  decisions.   Orders Placed This Encounter  Procedures   Vitamin B12   Ambulatory referral to [facility] ordered this encounter  Medications   memantine (NAMENDA) 10 MG tablet    Sig: Take 1 tablet (10 mg total) by mouth 2 (two) times daily.    Dispense:  60 tablet    Refill:  11    Return in about 1 year (around 03/17/2023).  I have spent a total of 65 minutes dedicated to this patient today, preparing to see patient, performing a medically  appropriate examination and evaluation, ordering tests and/or medications and procedures, and counseling and educating the patient/family/caregiver; independently interpreting result and communicating results to the family/patient/caregiver; and documenting clinical information in the electronic medical record.   Alric Ran, MD 03/16/2022, 3:24 PM  [facility] [address] [place], [place] [address] [phone]

## 2022-03-16 NOTE — Patient Instructions (Addendum)
Start Namenda 10 mg twice daily  Continue your other medications Follow up with your PCP and return in 1 year    There are well-accepted and sensible ways to reduce risk for Alzheimers disease and other degenerative brain disorders .  Exercise Daily Walk A daily 20 minute walk should be part of your routine. Disease related apathy can be a significant roadblock to exercise and the only way to overcome this is to make it a daily routine and perhaps have a reward at the end (something your loved one loves to eat or drink perhaps) or a personal trainer coming to the home can also be very useful. Most importantly, the patient is much more likely to exercise if the caregiver / spouse does it with him/her. In general a structured, repetitive schedule is best.  General Health: Any diseases which effect your body will effect your brain such as a pneumonia, urinary infection, blood clot, heart attack or stroke. Keep contact with your primary care doctor for regular follow ups.  Sleep. A good nights sleep is healthy for the brain. Seven hours is recommended. If you have insomnia or poor sleep habits we can give you some instructions. If you have sleep apnea wear your mask.  Diet: Eating a heart healthy diet is also a good idea; fish and poultry instead of red meat, nuts (mostly non-peanuts), vegetables, fruits, olive oil or canola oil (instead of butter), minimal salt (use other spices to flavor foods), whole grain rice, bread, cereal and pasta and wine in moderation.Research is now showing that the MIND diet, which is a combination of The Mediterranean diet and the DASH diet, is beneficial for cognitive processing and longevity. Information about this diet can be found in The MIND Diet, a book by Doyne Keel, MS, RDN, and online at [employer]bookDist[employer].si  Finances, Power of Attorney and Advance Directives: You should consider putting legal safeguards in place with regard to financial  and medical decision making. While the spouse always has power of attorney for medical and financial issues in the absence of any form, you should consider what you want in case the spouse / caregiver is no longer around or capable of making decisions.

## 2022-03-17 ENCOUNTER — Ambulatory Visit (INDEPENDENT_AMBULATORY_CARE_PROVIDER_SITE_OTHER): Payer: Medicare Other | Admitting: Physician Assistant

## 2022-03-17 ENCOUNTER — Encounter: Payer: Self-pay | Admitting: Physician Assistant

## 2022-03-17 VITALS — BP 110/70 | HR 60 | Ht 72.0 in | Wt 225.4 lb

## 2022-03-17 DIAGNOSIS — I77819 Aortic ectasia, unspecified site: Secondary | ICD-10-CM | POA: Diagnosis not present

## 2022-03-17 DIAGNOSIS — I350 Nonrheumatic aortic (valve) stenosis: Secondary | ICD-10-CM | POA: Diagnosis not present

## 2022-03-17 DIAGNOSIS — I482 Chronic atrial fibrillation, unspecified: Secondary | ICD-10-CM

## 2022-03-17 DIAGNOSIS — M7989 Other specified soft tissue disorders: Secondary | ICD-10-CM | POA: Diagnosis not present

## 2022-03-17 LAB — VITAMIN B12: Vitamin B-12: 911 pg/mL (ref 232–1245)

## 2022-03-17 NOTE — Patient Instructions (Signed)
Medication Instructions:  No Changes *If you need a refill on your cardiac medications before your next appointment, please call your pharmacy*   Lab Work: No Labs If you have labs (blood work) drawn today and your tests are completely normal, you will receive your results only by: MyChart Message (if you have MyChart) OR A paper copy in the mail If you have any lab test that is abnormal or we need to change your treatment, we will call you to review the results.   Testing/Procedures: Your physician has requested that you have an echocardiogram. Echocardiography is a painless test that uses sound waves to create images of your heart. It provides your doctor with information about the size and shape of your heart and how well your heart's chambers and valves are working. This procedure takes approximately one hour. There are no restrictions for this procedure.    Follow-Up: At [facility], you and your health needs are our priority.  As part of our continuing mission to provide you with exceptional heart care, we have created designated Provider Care Teams.  These Care Teams include your primary Cardiologist (physician) and Advanced Practice Providers (APPs -  Physician Assistants and Nurse Practitioners) who all work together to provide you with the care you need, when you need it.  We recommend signing up for the patient portal called "MyChart".  Sign up information is provided on this After Visit Summary.  MyChart is used to connect with patients for Virtual Visits (Telemedicine).  Patients are able to view lab/test results, encounter notes, upcoming appointments, etc.  Non-urgent messages can be sent to your provider as well.   To learn more about what you can do with MyChart, go to [email or web address].    Your next appointment:   November 2023 After Echocardiogram.  The format for your next appointment:   In Person  Provider:   [name], MD       Important  Information About Sugar

## 2022-03-18 ENCOUNTER — Telehealth: Payer: Self-pay | Admitting: Neurology

## 2022-03-18 NOTE — Telephone Encounter (Signed)
Thank you for your assistance, wasn't aware of that.

## 2022-03-18 NOTE — Telephone Encounter (Signed)
Disease education and medication management.  Thank you

## 2022-03-18 NOTE — Telephone Encounter (Signed)
From [facility]: Tori, I spoke with the patient's daughter. They are current with [facility] at this time with PT/OT/RN. We, as you know can not also be involved while they are. I explained this to his daughter as well and she was grateful for the call.

## 2022-03-18 NOTE — Telephone Encounter (Signed)
[facility] is going to take this patient. They sent this message: Can I ask what exactly the Nursing need is? Disease Education/management, Med Management, ???? Please advise

## 2022-03-29 ENCOUNTER — Ambulatory Visit: Payer: Medicare Other

## 2022-03-29 ENCOUNTER — Other Ambulatory Visit: Payer: Self-pay | Admitting: Physician Assistant

## 2022-03-29 ENCOUNTER — Telehealth: Payer: Self-pay | Admitting: *Deleted

## 2022-03-29 DIAGNOSIS — Z7901 Long term (current) use of anticoagulants: Secondary | ICD-10-CM | POA: Diagnosis not present

## 2022-03-29 DIAGNOSIS — I4891 Unspecified atrial fibrillation: Secondary | ICD-10-CM

## 2022-03-29 DIAGNOSIS — Z5181 Encounter for therapeutic drug level monitoring: Secondary | ICD-10-CM | POA: Diagnosis not present

## 2022-03-29 DIAGNOSIS — G4700 Insomnia, unspecified: Secondary | ICD-10-CM

## 2022-03-29 LAB — POCT INR: INR: 2.5 (ref 2.0–3.0)

## 2022-03-29 MED ORDER — WARFARIN SODIUM 5 MG PO TABS: ORAL_TABLET | ORAL | 0 refills | Status: DC

## 2022-03-29 NOTE — Patient Instructions (Signed)
Description   Continue taking 1 tablet daily except 0.5 tablet on Monday, Wednesday and Friday.  Stay consistent with greens (2 times per week)  Recheck INR 5 weeks.  [facility] [phone]

## 2022-03-29 NOTE — Telephone Encounter (Signed)
Warfarin refill request received.

## 2022-03-31 ENCOUNTER — Ambulatory Visit: Payer: Medicare Other | Admitting: Physician Assistant

## 2022-03-31 ENCOUNTER — Ambulatory Visit
Admission: EM | Admit: 2022-03-31 | Discharge: 2022-03-31 | Disposition: A | Discharge status: Home / Self Care | Payer: Medicare Other | Attending: Internal Medicine | Admitting: Internal Medicine

## 2022-03-31 DIAGNOSIS — H109 Unspecified conjunctivitis: Secondary | ICD-10-CM | POA: Diagnosis not present

## 2022-03-31 MED ORDER — ERYTHROMYCIN 5 MG/GM OP OINT: TOPICAL_OINTMENT | OPHTHALMIC | 0 refills | Status: DC

## 2022-03-31 NOTE — Discharge Instructions (Signed)
You have an infection of your eye which is being treated with antibiotic medication.  Please change pillowcase and linen daily.  Follow-up with eye doctor for further evaluation and management.

## 2022-03-31 NOTE — ED Provider Notes (Signed)
[facility]    CSN: [id number] Arrival date & time: 03/31/22  1023      History   Chief Complaint Chief Complaint  Patient presents with   right eye infection    HPI Warren Wagner. is a 87 y.o. male.   Patient and caregiver present today with concerns of right eye infection.  Patient and caregiver report that they have noticed some right eye redness, irritation, purulent drainage over the past few days.  Patient denies any pain, feelings of foreign body, trauma, foreign body to the eye.  Denies any associated upper respiratory symptoms other than runny nose but caregiver reports that he has runny nose at baseline.  Denies fever.  Patient does have a history of macular degeneration but reports no changes from vision at baseline.     Past Medical History:  Diagnosis Date   Atrial fibrillation (HCC)    Hearing loss    Hypertension    Prostate enlargement    Umbilical hernia 07/27/2012    Patient Active Problem List   Diagnosis Date Noted   Aortic root enlargement (HCC) 02/13/2022   Acute respiratory failure with hypoxia (HCC) 02/05/2022   Aortic stenosis 02/04/2022   UTI (urinary tract infection) 02/04/2022   Rhabdomyolysis 02/03/2022   COPD (chronic obstructive pulmonary disease) (HCC) 02/03/2022   Constipation 02/03/2022   Postural dizziness with presyncope 02/02/2022   Aneurysm, aorta, thoracic (HCC) 12/29/2021   Insomnia 12/29/2021   Benign prostatic hyperplasia 12/29/2021   Environmental and seasonal allergies 12/29/2021   Advanced nonexudative age-related macular degeneration of left eye with subfoveal involvement 07/06/2021   Long term (current) use of anticoagulants 02/19/2021   Essential hypertension 01/11/2021   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) 03/02/2020   Advanced nonexudative age-related macular degeneration of right eye without subfoveal involvement 03/02/2020    Posterior vitreous detachment of both eyes 03/02/2020   Lymphedema 11/22/2019   Bilateral leg edema 11/13/2019   Venous stasis of lower extremity 11/13/2019   Venous stasis ulcer of right calf with fat layer exposed with varicose veins (HCC) 11/13/2019   Atrial fibrillation (HCC) 07/27/2012    Past Surgical History:  Procedure Laterality Date   CARDIOVERSION  10/10/2006   cataracts Bilateral    HERNIA REPAIR  09/10/2012   large umbilical hernia   INSERTION OF MESH  09/10/2012   Procedure: INSERTION OF MESH;  Surgeon: [name], MD;  Location: [facility] OR;  Service: General;  Laterality: N/A;   macular degeneration Bilateral    TONSILLECTOMY     "maybe" (09/10/2012)   UMBILICAL HERNIA REPAIR  09/10/2012   Procedure: HERNIA REPAIR UMBILICAL ADULT;  Surgeon: [name], MD;  Location: [facility] OR;  Service: General;  Laterality: N/A;       Home Medications    Prior to Admission medications   Medication Sig Start Date End Date Taking? Authorizing Provider  erythromycin ophthalmic ointment Place a 1/2 inch ribbon of ointment into the lower eyelid 4 times daily for 7 days. 03/31/22  Yes , Rolly Salter E, FNP  acetaminophen (TYLENOL) 500 MG tablet Take 1,000 mg by mouth every 6 (six) hours as needed for moderate pain or headache.    [provider]  calcium-vitamin D (OSCAL WITH D) 500-5 MG-MCG tablet Take 1 tablet by mouth daily with breakfast.    [provider]  cetirizine (ZYRTEC) 10 MG tablet Take 1 tablet (10 mg total) by mouth daily. 01/21/22   Mayer Masker,  PA-C  docusate sodium (COLACE) 100 MG capsule Take 100 mg by mouth at bedtime as needed for mild constipation.    [provider]  finasteride (PROSCAR) 5 MG tablet Take 1 tablet (5 mg total) by mouth daily. 01/21/22   Lorrene Reid, PA-C  fish oil-omega-3 fatty acids 1000 MG capsule Take 1 g by mouth daily.    [provider]  Fluticasone-Umeclidin-Vilant (TRELEGY ELLIPTA) 100-62.5-25  MCG/ACT AEPB Inhale 1 puff into the lungs daily. 02/22/22   Lorrene Reid, PA-C  furosemide (LASIX) 40 MG tablet Take 1 tablet (40 mg total) by mouth daily. 06/17/21   Warren Lacy, PA-C  melatonin 5 MG TABS Take 5-10 mg by mouth at bedtime as needed (sleep).    [provider]  memantine (NAMENDA) 10 MG tablet Take 1 tablet (10 mg total) by mouth 2 (two) times daily. 03/16/22 03/11/23  Alric Ran, MD  metoprolol succinate (TOPROL-XL) 50 MG 24 hr tablet Take 1 tablet (50 mg total) by mouth daily. Take with or immediately following a meal. 12/29/21   Abonza, Maritza, PA-C  Multiple Vitamins-Minerals (MULTIVITAMIN WITH MINERALS) tablet Take 1 tablet by mouth daily.    [provider]  Multiple Vitamins-Minerals (OCUVITE PO) Take 1 tablet by mouth daily.    [provider]  sodium chloride (OCEAN) 0.65 % SOLN nasal spray Place 1 spray into both nostrils as needed for congestion.    [provider]  traZODone (DESYREL) 50 MG tablet TAKE 1/2 TO 1 (ONE-HALF TO ONE) TABLET BY MOUTH AT BEDTIME AS NEEDED FOR SLEEP 03/29/22   Abonza, Maritza, PA-C  warfarin (COUMADIN) 5 MG tablet TAKE 1/2 TO 1 TABLET BY MOUTH ONCE DAILY AS DIRECTED BY ANTICOAGULATION CLINIC. 03/29/22   Minus Breeding, MD    Family History Family History  Problem Relation Age of Onset   Heart disease Mother    Stroke Father     Social History Social History   Tobacco Use   Smoking status: Former    Packs/day: 1.50    Years: 10.00    Total pack years: 15.00    Types: Cigarettes    Quit date: 07/27/1982    Years since quitting: 39.7   Smokeless tobacco: Never  Vaping Use   Vaping Use: Never used  Substance Use Topics   Alcohol use: No   Drug use: No     Allergies   Patient has no known allergies.   Review of Systems Review of Systems Per HPI  Physical Exam Triage Vital Signs ED Triage Vitals [03/31/22 1037]  Enc Vitals Group     BP 125/60     Pulse Rate 60     Resp 18      Temp 98 F (36.7 C)     Temp Source Oral     SpO2 93 %     Weight      Height      Head Circumference      Peak Flow      Pain Score 0     Pain Loc      Pain Edu?      Excl. in [place]?    No data found.  Updated Vital Signs BP 125/60 (BP Location: Left Arm)   Pulse 60   Temp 98 F (36.7 C) (Oral)   Resp 18   SpO2 99%   Visual Acuity Right Eye Distance:   Left Eye Distance:   Bilateral Distance:    Right Eye Near:   Left Eye  Near:    Bilateral Near:     Physical Exam Constitutional:      General: He is not in acute distress.    Appearance: Normal appearance. He is not toxic-appearing or diaphoretic.  HENT:     Head: Normocephalic and atraumatic.  Eyes:     General: Lids are normal. Lids are everted, no foreign bodies appreciated. Vision grossly intact. Gaze aligned appropriately.     Extraocular Movements: Extraocular movements intact.     Conjunctiva/sclera:     Right eye: Right conjunctiva is injected. Exudate present. No chemosis or hemorrhage.    Left eye: Left conjunctiva is not injected. No chemosis, exudate or hemorrhage.    Pupils: Pupils are equal, round, and reactive to light.  Pulmonary:     Effort: Pulmonary effort is normal.  Neurological:     General: No focal deficit present.     Mental Status: He is alert and oriented to person, place, and time. Mental status is at baseline.  Psychiatric:        Mood and Affect: Mood normal.        Behavior: Behavior normal.        Thought Content: Thought content normal.        Judgment: Judgment normal.      UC Treatments / Results  Labs (all labs ordered are listed, but only abnormal results are displayed) Labs Reviewed - No data to display  EKG   Radiology No results found.  Procedures Procedures (including critical care time)  Medications Ordered in UC Medications - No data to display  Initial Impression / Assessment and Plan / UC Course  I have reviewed the triage vital signs and the  nursing notes.  Pertinent labs & imaging results that were available during my care of the patient were reviewed by me and considered in my medical decision making (see chart for details).     Physical exam is consistent with right bacterial conjunctivitis.  Will treat with antibiotic ointment.  Discussed changing pillowcase and linen daily.  Advised caregiver and patient to follow-up with eye doctor for further evaluation and management.  Visual acuity appears unchanged from baseline.  Discussed return precautions.  Patient and caregiver verbalized understanding and were agreeable with plan. Final Clinical Impressions(s) / UC Diagnoses   Final diagnoses:  Bacterial conjunctivitis of right eye     Discharge Instructions      You have an infection of your eye which is being treated with antibiotic medication.  Please change pillowcase and linen daily.  Follow-up with eye doctor for further evaluation and management.    ED Prescriptions     Medication Sig Dispense Auth. Provider   erythromycin ophthalmic ointment Place a 1/2 inch ribbon of ointment into the lower eyelid 4 times daily for 7 days. 3.5 g Gustavus Bryant, [place]      PDMP not reviewed this encounter.   Gustavus Bryant, [place] 03/31/22 1100

## 2022-03-31 NOTE — ED Triage Notes (Signed)
Pt c/o right eye infection x a few weeks

## 2022-04-14 NOTE — Patient Instructions (Addendum)

## 2022-04-20 ENCOUNTER — Encounter: Payer: Self-pay | Admitting: Physician Assistant

## 2022-04-20 ENCOUNTER — Ambulatory Visit (INDEPENDENT_AMBULATORY_CARE_PROVIDER_SITE_OTHER): Payer: Medicare Other | Admitting: Physician Assistant

## 2022-04-20 VITALS — BP 122/67 | HR 73 | Temp 97.7°F | Ht 72.0 in | Wt 228.0 lb

## 2022-04-20 DIAGNOSIS — G4700 Insomnia, unspecified: Secondary | ICD-10-CM

## 2022-04-20 DIAGNOSIS — I10 Essential (primary) hypertension: Secondary | ICD-10-CM

## 2022-04-20 DIAGNOSIS — R600 Localized edema: Secondary | ICD-10-CM

## 2022-04-20 NOTE — Assessment & Plan Note (Signed)
-  Stable. Recommend to continue with Lasix 40 daily and take an extra dose as needed. Continue to monitor weight. Discussed low sodium diet and elevation. Will collect CMP for medication monitoring with lab visit.

## 2022-04-20 NOTE — Progress Notes (Signed)
Established patient visit   Patient: Warren Wagner.   DOB: 07-Jul-1935   87 y.o. Male  MRN: [id number] Visit Date: 04/20/2022  Chief Complaint  Patient presents with   Follow-up   Subjective    HPI  Patient presents for chronic follow-up.  HTN: Pt denies chest pain, palpitations, dizziness or syncope. Patient's daughter states lower extremity swelling has been stable and they been monitoring his weight and has needed to take an extra dose of Lasix a few times. Taking medication as directed without side effects. BP at home has been stable, <130/90. Trying to monitor sodium.   Insomnia: Patient was started on Trazodone to help with sleep. Patient reports medication did not help.   Medications: Outpatient Medications Prior to Visit  Medication Sig   acetaminophen (TYLENOL) 500 MG tablet Take 1,000 mg by mouth every 6 (six) hours as needed for moderate pain or headache.   calcium-vitamin D (OSCAL WITH D) 500-5 MG-MCG tablet Take 1 tablet by mouth daily with breakfast.   cetirizine (ZYRTEC) 10 MG tablet Take 1 tablet (10 mg total) by mouth daily.   docusate sodium (COLACE) 100 MG capsule Take 100 mg by mouth at bedtime as needed for mild constipation.   erythromycin ophthalmic ointment Place a 1/2 inch ribbon of ointment into the lower eyelid 4 times daily for 7 days.   finasteride (PROSCAR) 5 MG tablet Take 1 tablet (5 mg total) by mouth daily.   fish oil-omega-3 fatty acids 1000 MG capsule Take 1 g by mouth daily.   Fluticasone-Umeclidin-Vilant (TRELEGY ELLIPTA) 100-62.5-25 MCG/ACT AEPB Inhale 1 puff into the lungs daily.   furosemide (LASIX) 40 MG tablet Take 1 tablet (40 mg total) by mouth daily.   melatonin 5 MG TABS Take 5-10 mg by mouth at bedtime as needed (sleep).   memantine (NAMENDA) 10 MG tablet Take 1 tablet (10 mg total) by mouth 2 (two) times daily.   metoprolol succinate (TOPROL-XL) 50 MG 24 hr tablet Take 1 tablet (50 mg total) by mouth daily. Take with or  immediately following a meal.   Multiple Vitamins-Minerals (MULTIVITAMIN WITH MINERALS) tablet Take 1 tablet by mouth daily.   Multiple Vitamins-Minerals (OCUVITE PO) Take 1 tablet by mouth daily.   sodium chloride (OCEAN) 0.65 % SOLN nasal spray Place 1 spray into both nostrils as needed for congestion.   warfarin (COUMADIN) 5 MG tablet TAKE 1/2 TO 1 TABLET BY MOUTH ONCE DAILY AS DIRECTED BY ANTICOAGULATION CLINIC.   [DISCONTINUED] traZODone (DESYREL) 50 MG tablet TAKE 1/2 TO 1 (ONE-HALF TO ONE) TABLET BY MOUTH AT BEDTIME AS NEEDED FOR SLEEP   No facility-administered medications prior to visit.    Review of Systems Review of Systems:  A fourteen system review of systems was performed and found to be positive as per HPI.  Last CBC Lab Results  Component Value Date   WBC 9.4 02/10/2022   HGB 13.9 02/10/2022   HCT 41.3 02/10/2022   MCV 88 02/10/2022   MCH 29.6 02/10/2022   RDW 13.1 02/10/2022   PLT 280 03/47/4259   Last metabolic panel Lab Results  Component Value Date   GLUCOSE 104 (H) 02/10/2022   NA 145 (H) 02/10/2022   K 4.0 02/10/2022   CL 103 02/10/2022   CO2 26 02/10/2022   BUN 22 02/10/2022   CREATININE 1.05 02/10/2022   EGFR 70 02/10/2022   CALCIUM 9.2 02/10/2022   PHOS 3.6 02/02/2022   PROT 6.4 02/10/2022   ALBUMIN 4.1 02/10/2022  LABGLOB 2.3 02/10/2022   AGRATIO 1.8 02/10/2022   BILITOT 0.7 02/10/2022   ALKPHOS 65 02/10/2022   AST 56 (H) 02/10/2022   ALT 60 (H) 02/10/2022   ANIONGAP 9 02/05/2022   Last lipids Lab Results  Component Value Date   CHOL 195 01/06/2022   HDL 54 01/06/2022   LDLCALC 126 (H) 01/06/2022   TRIG 82 01/06/2022   CHOLHDL 3.6 01/06/2022   Last hemoglobin A1c Lab Results  Component Value Date   HGBA1C 5.6 01/06/2022   Last thyroid functions Lab Results  Component Value Date   TSH 1.860 02/03/2022   Last vitamin D No results found for: "25OHVITD2", "25OHVITD3", "VD25OH"   Objective    BP 122/67   Pulse 73   Temp  97.7 F (36.5 C)   Ht 6' (1.829 m)   Wt 228 lb (103.4 kg)   SpO2 94%   BMI 30.92 kg/m  BP Readings from Last 3 Encounters:  04/20/22 122/67  03/31/22 125/60  03/17/22 110/70   Wt Readings from Last 3 Encounters:  04/20/22 228 lb (103.4 kg)  03/17/22 225 lb 6.4 oz (102.2 kg)  03/16/22 225 lb (102.1 kg)    Physical Exam  General:  Well Developed, well nourished, appropriate for stated age.  Neuro:  Alert and oriented,  extra-ocular muscles intact  HEENT:  Normocephalic, atraumatic, neck supple  Skin:  no gross rash, warm, pink. Cardiac:  RRR, S1 S2 Respiratory: CTA B/L  Vascular:  Ext warm, no cyanosis apprec.; cap RF less 2 sec. Trace edema of LLE Psych:  No HI/SI, judgement and insight good, Euthymic mood. Full Affect.   No results found for any visits on 04/20/22.  Assessment & Plan      Problem List Items Addressed This Visit       Cardiovascular and Mediastinum   Essential hypertension - Primary    -Stable. -Continue current medication regimen. -Will continue to monitor.        Other   Bilateral leg edema    -Stable. Recommend to continue with Lasix 40 daily and take an extra dose as needed. Continue to monitor weight. Discussed low sodium diet and elevation. Will collect CMP for medication monitoring with lab visit.      Insomnia    -Unchanged since starting medication therapy so will stop medication (Trazodone). Discussed good sleep hygiene. Will continue to mointor.       Return in about 4 months (around 08/21/2022) for [facility] with [facility] (telehealth ok) ; lab visit for FBW (cmp, lipid panel, cbc) in 1-3 weeks.        Lorrene Reid, PA-C  [facility] at [facility] [phone] (phone) [phone] (fax)  [place]

## 2022-04-20 NOTE — Assessment & Plan Note (Signed)
-  Unchanged since starting medication therapy so will stop medication (Trazodone). Discussed good sleep hygiene. Will continue to mointor.

## 2022-04-20 NOTE — Assessment & Plan Note (Signed)
-  Stable. -Continue current medication regimen.  -Will continue to monitor. 

## 2022-04-22 ENCOUNTER — Encounter: Payer: Self-pay | Admitting: Physician Assistant

## 2022-05-03 ENCOUNTER — Ambulatory Visit (INDEPENDENT_AMBULATORY_CARE_PROVIDER_SITE_OTHER): Payer: Medicare Other

## 2022-05-03 ENCOUNTER — Other Ambulatory Visit: Payer: Self-pay

## 2022-05-03 DIAGNOSIS — Z130 Encounter for screening for diseases of the blood and blood-forming organs and certain disorders involving the immune mechanism: Secondary | ICD-10-CM

## 2022-05-03 DIAGNOSIS — Z7901 Long term (current) use of anticoagulants: Secondary | ICD-10-CM | POA: Diagnosis not present

## 2022-05-03 DIAGNOSIS — I4891 Unspecified atrial fibrillation: Secondary | ICD-10-CM | POA: Diagnosis not present

## 2022-05-03 DIAGNOSIS — Z5181 Encounter for therapeutic drug level monitoring: Secondary | ICD-10-CM | POA: Diagnosis not present

## 2022-05-03 DIAGNOSIS — I10 Essential (primary) hypertension: Secondary | ICD-10-CM

## 2022-05-03 DIAGNOSIS — Z0000 Encounter for general adult medical examination without abnormal findings: Secondary | ICD-10-CM

## 2022-05-03 LAB — POCT INR: INR: 2.4 (ref 2.0–3.0)

## 2022-05-03 NOTE — Patient Instructions (Signed)
Description   Continue taking 1 tablet daily except 0.5 tablet on Monday, Wednesday and Friday.  Stay consistent with greens (2 times per week)  Recheck INR 6 weeks.  [facility] [phone]

## 2022-05-04 ENCOUNTER — Other Ambulatory Visit: Payer: Medicare Other

## 2022-05-04 DIAGNOSIS — Z1329 Encounter for screening for other suspected endocrine disorder: Secondary | ICD-10-CM | POA: Diagnosis not present

## 2022-05-04 DIAGNOSIS — Z13228 Encounter for screening for other metabolic disorders: Secondary | ICD-10-CM | POA: Diagnosis not present

## 2022-05-04 DIAGNOSIS — M6282 Rhabdomyolysis: Secondary | ICD-10-CM

## 2022-05-04 DIAGNOSIS — I482 Chronic atrial fibrillation, unspecified: Secondary | ICD-10-CM

## 2022-05-04 DIAGNOSIS — Z130 Encounter for screening for diseases of the blood and blood-forming organs and certain disorders involving the immune mechanism: Secondary | ICD-10-CM

## 2022-05-04 DIAGNOSIS — Z1321 Encounter for screening for nutritional disorder: Secondary | ICD-10-CM | POA: Diagnosis not present

## 2022-05-04 DIAGNOSIS — I77819 Aortic ectasia, unspecified site: Secondary | ICD-10-CM

## 2022-05-04 DIAGNOSIS — M7989 Other specified soft tissue disorders: Secondary | ICD-10-CM

## 2022-05-04 DIAGNOSIS — I10 Essential (primary) hypertension: Secondary | ICD-10-CM

## 2022-05-04 DIAGNOSIS — Z0000 Encounter for general adult medical examination without abnormal findings: Secondary | ICD-10-CM

## 2022-05-04 DIAGNOSIS — I350 Nonrheumatic aortic (valve) stenosis: Secondary | ICD-10-CM

## 2022-05-05 LAB — CBC WITH DIFFERENTIAL/PLATELET
Basophils Absolute: 0.1 10*3/uL (ref 0.0–0.2)
Basos: 1 %
EOS (ABSOLUTE): 0.3 10*3/uL (ref 0.0–0.4)
Eos: 3 %
Hematocrit: 48.2 % (ref 37.5–51.0)
Hemoglobin: 16 g/dL (ref 13.0–17.7)
Immature Grans (Abs): 0 10*3/uL (ref 0.0–0.1)
Immature Granulocytes: 0 %
Lymphocytes Absolute: 1.9 10*3/uL (ref 0.7–3.1)
Lymphs: 25 %
MCH: 29.1 pg (ref 26.6–33.0)
MCHC: 33.2 g/dL (ref 31.5–35.7)
MCV: 88 fL (ref 79–97)
Monocytes Absolute: 0.7 10*3/uL (ref 0.1–0.9)
Monocytes: 9 %
Neutrophils Absolute: 4.7 10*3/uL (ref 1.4–7.0)
Neutrophils: 62 %
Platelets: 173 10*3/uL (ref 150–450)
RBC: 5.5 x10E6/uL (ref 4.14–5.80)
RDW: 13.1 % (ref 11.6–15.4)
WBC: 7.6 10*3/uL (ref 3.4–10.8)

## 2022-05-05 LAB — CK: Total CK: 50 U/L (ref 30–208)

## 2022-05-05 LAB — COMPREHENSIVE METABOLIC PANEL
ALT: 28 IU/L (ref 0–44)
AST: 34 IU/L (ref 0–40)
Albumin/Globulin Ratio: 1.8 (ref 1.2–2.2)
Albumin: 4.4 g/dL (ref 3.7–4.7)
Alkaline Phosphatase: 80 IU/L (ref 44–121)
BUN/Creatinine Ratio: 15 (ref 10–24)
BUN: 19 mg/dL (ref 8–27)
Bilirubin Total: 0.7 mg/dL (ref 0.0–1.2)
CO2: 26 mmol/L (ref 20–29)
Calcium: 9.5 mg/dL (ref 8.6–10.2)
Chloride: 101 mmol/L (ref 96–106)
Creatinine, Ser: 1.29 mg/dL — ABNORMAL HIGH (ref 0.76–1.27)
Globulin, Total: 2.5 g/dL (ref 1.5–4.5)
Glucose: 88 mg/dL (ref 70–99)
Potassium: 5.2 mmol/L (ref 3.5–5.2)
Sodium: 143 mmol/L (ref 134–144)
Total Protein: 6.9 g/dL (ref 6.0–8.5)
eGFR: 54 mL/min/{1.73_m2} — ABNORMAL LOW (ref >59)

## 2022-05-05 LAB — LIPID PANEL
Chol/HDL Ratio: 3.8 ratio (ref 0.0–5.0)
Cholesterol, Total: 218 mg/dL — ABNORMAL HIGH (ref 100–199)
HDL: 58 mg/dL (ref >39)
LDL Chol Calc (NIH): 147 mg/dL — ABNORMAL HIGH (ref 0–99)
Triglycerides: 72 mg/dL (ref 0–149)
VLDL Cholesterol Cal: 13 mg/dL (ref 5–40)

## 2022-05-10 ENCOUNTER — Other Ambulatory Visit: Payer: Self-pay | Admitting: Physician Assistant

## 2022-05-10 DIAGNOSIS — J449 Chronic obstructive pulmonary disease, unspecified: Secondary | ICD-10-CM

## 2022-06-02 DIAGNOSIS — H1033 Unspecified acute conjunctivitis, bilateral: Secondary | ICD-10-CM | POA: Diagnosis not present

## 2022-06-12 ENCOUNTER — Other Ambulatory Visit: Payer: Self-pay | Admitting: Physician Assistant

## 2022-06-12 DIAGNOSIS — J449 Chronic obstructive pulmonary disease, unspecified: Secondary | ICD-10-CM

## 2022-06-14 ENCOUNTER — Ambulatory Visit: Payer: Medicare Other | Attending: Cardiology

## 2022-06-14 DIAGNOSIS — I4891 Unspecified atrial fibrillation: Secondary | ICD-10-CM | POA: Diagnosis not present

## 2022-06-14 DIAGNOSIS — Z5181 Encounter for therapeutic drug level monitoring: Secondary | ICD-10-CM

## 2022-06-14 DIAGNOSIS — Z7901 Long term (current) use of anticoagulants: Secondary | ICD-10-CM | POA: Diagnosis not present

## 2022-06-14 LAB — POCT INR: INR: 2.1 (ref 2.0–3.0)

## 2022-06-14 NOTE — Patient Instructions (Signed)
Description   Take 1.5 tablets today and then continue taking 1 tablet daily except 0.5 tablet on Monday, Wednesday and Friday.  Stay consistent with greens (2 times per week)  Recheck INR 6 weeks.  [facility] [phone]

## 2022-06-17 ENCOUNTER — Ambulatory Visit: Payer: Medicare Other | Admitting: Cardiology

## 2022-07-06 ENCOUNTER — Encounter (INDEPENDENT_AMBULATORY_CARE_PROVIDER_SITE_OTHER): Payer: Medicare Other | Admitting: Ophthalmology

## 2022-07-06 ENCOUNTER — Telehealth: Payer: Self-pay | Admitting: Cardiology

## 2022-07-06 NOTE — Telephone Encounter (Signed)
Called patient, he states he is having some swelling in his left foot, no redness, not painful, no symptoms (no weight gain, shortness of breath) he is taking his medications, he is trying to elevate his feet and was advised to call us back- he will do this.   No other concerns at this time.

## 2022-07-06 NOTE — Telephone Encounter (Signed)
Pt sent this via mychart to out sching pool:   Swelling is in the left foot weight is good 224.5 today down from 226.  Not short of breath.  Have a good bit of sinus drainage.  Have not traveled.  Take fluid pill every day.  Will keep feet elevated and keep eye on things and contact again if needed.  Thank you   Good Afternoon E[place],  Can you tell me a little more about your swelling   If swelling, where is the swelling located?   How much weight have you gained and in what time span?   Have you gained 3 pounds in a day or 5 pounds in a week?   Do you have a log of your daily weights (if so, list)?   Are you currently taking a fluid pill?   Are you currently SOB?   Have you traveled recently?

## 2022-07-07 ENCOUNTER — Encounter: Payer: Self-pay | Admitting: Cardiology

## 2022-07-08 ENCOUNTER — Ambulatory Visit (INDEPENDENT_AMBULATORY_CARE_PROVIDER_SITE_OTHER): Payer: Medicare Other

## 2022-07-08 ENCOUNTER — Ambulatory Visit
Admission: RE | Admit: 2022-07-08 | Discharge: 2022-07-08 | Disposition: A | Discharge status: Home / Self Care | Payer: Medicare Other | Source: Ambulatory Visit | Attending: Physician Assistant | Admitting: Physician Assistant

## 2022-07-08 VITALS — BP 120/68 | HR 81 | Temp 98.4°F | Resp 17

## 2022-07-08 DIAGNOSIS — R062 Wheezing: Secondary | ICD-10-CM

## 2022-07-08 DIAGNOSIS — J441 Chronic obstructive pulmonary disease with (acute) exacerbation: Secondary | ICD-10-CM | POA: Diagnosis not present

## 2022-07-08 DIAGNOSIS — R059 Cough, unspecified: Secondary | ICD-10-CM | POA: Diagnosis not present

## 2022-07-08 MED ORDER — PREDNISONE 20 MG PO TABS: 40.0000 mg | ORAL_TABLET | Freq: Every day | ORAL | 0 refills | Status: AC

## 2022-07-08 NOTE — ED Triage Notes (Signed)
Pt presents with non productive cough X 1 week; pt has Hx of COPD

## 2022-07-08 NOTE — Discharge Instructions (Signed)
  CXR is without any new findings. Steroid prescribed to cover COPD exacerbation.   Recommend follow up with primary care if he has any persistent symptoms of cough or swelling.  Report to ED with any worsening symptoms.

## 2022-07-08 NOTE — ED Provider Notes (Signed)
Warren Wagner    CSN: [id number] Arrival date & time: 07/08/22  1343      History   Chief Complaint Chief Complaint  Patient presents with   Cough    having a cough and some wheezing - Entered by patient    HPI Earsel Warren Wagner. is a 87 y.o. male.   Patient here today with daughter for evaluation of nonproductive cough has had for a week.  Per daughter he does have history of COPD.  He has not had any associated shortness of breath.  He denies any fever.  He has not any nausea, vomiting or diarrhea.  He denies sore throat or nasal congestion.  Daughter reports head CT from April did show possible sinusitis.  To her knowledge she was not treated for this.  He was being treated by PCP for leg swelling and daughter reports this has improved with increased Lasix dose.  The history is provided by the patient.  Cough Associated symptoms: no chills, no eye discharge, no fever, no shortness of breath and no sore throat     Past Medical History:  Diagnosis Date   Atrial fibrillation (HCC)    Hearing loss    Hypertension    Prostate enlargement    Umbilical hernia 42/35/3614    Patient Active Problem List   Diagnosis Date Noted   Aortic root enlargement ([place]) 02/13/2022   Acute respiratory failure with hypoxia ([place]) 02/05/2022   Aortic stenosis 02/04/2022   UTI (urinary tract infection) 02/04/2022   Rhabdomyolysis 02/03/2022   COPD (chronic obstructive pulmonary disease) ([place]) 02/03/2022   Constipation 02/03/2022   Postural dizziness with presyncope 02/02/2022   Aneurysm, aorta, 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[place]) 12/29/2021   Insomnia 12/29/2021   Benign prostatic hyperplasia 12/29/2021   Environmental and seasonal allergies 12/29/2021   Advanced nonexudative age-related macular degeneration of left eye with subfoveal involvement 07/06/2021   Long term (current) use of anticoagulants 02/19/2021   Essential hypertension 01/11/2021   Exudative age-related macular degeneration of  left eye with inactive choroidal neovascularization ([place]) 03/02/2020   Advanced nonexudative age-related macular degeneration of right eye without subfoveal involvement 03/02/2020   Posterior vitreous detachment of both eyes 03/02/2020   Lymphedema 11/22/2019   Bilateral leg edema 11/13/2019   Venous stasis of lower extremity 11/13/2019   Venous stasis ulcer of right calf with fat layer exposed with varicose veins ([place]) 11/13/2019   Atrial fibrillation ([place]) 07/27/2012    Past Surgical History:  Procedure Laterality Date   CARDIOVERSION  10/10/2006   cataracts Bilateral    HERNIA REPAIR  43/15/4008   large umbilical hernia   INSERTION OF MESH  09/10/2012   Procedure: INSERTION OF MESH;  Surgeon: Zenovia Jarred, MD;  Location: [facility];  Service: General;  Laterality: N/A;   macular degeneration Bilateral    TONSILLECTOMY     "maybe" (67/03/1947)   UMBILICAL HERNIA REPAIR  09/10/2012   Procedure: HERNIA REPAIR UMBILICAL ADULT;  Surgeon: Zenovia Jarred, MD;  Location: [facility];  Service: General;  Laterality: N/A;       Home Medications    Prior to Admission medications   Medication Sig Start Date End Date Taking? Authorizing Provider  predniSONE (DELTASONE) 20 MG tablet Take 2 tablets (40 mg total) by mouth daily with breakfast for 5 days. 07/08/22 07/13/22 Yes Francene Finders, PA-C  acetaminophen (TYLENOL) 500 MG tablet Take 1,000 mg by mouth every 6 (six) hours as needed for moderate pain or headache.  [provider]  calcium-vitamin D (OSCAL WITH D) 500-5 MG-MCG tablet Take 1 tablet by mouth daily with breakfast.    [provider]  cetirizine (ZYRTEC) 10 MG tablet Take 1 tablet (10 mg total) by mouth daily. 01/21/22   Mayer Masker, PA-C  docusate sodium (COLACE) 100 MG capsule Take 100 mg by mouth at bedtime as needed for mild constipation.    [provider]  erythromycin ophthalmic ointment Place a 1/2 inch ribbon of ointment into the lower eyelid  4 times daily for 7 days. 03/31/22   Gustavus Bryant, FNP  finasteride (PROSCAR) 5 MG tablet Take 1 tablet (5 mg total) by mouth daily. 01/21/22   Mayer Masker, PA-C  fish oil-omega-3 fatty acids 1000 MG capsule Take 1 g by mouth daily.    [provider]  furosemide (LASIX) 40 MG tablet Take 1 tablet (40 mg total) by mouth daily. 06/17/21   Cannon Kettle, PA-C  melatonin 5 MG TABS Take 5-10 mg by mouth at bedtime as needed (sleep).    [provider]  memantine (NAMENDA) 10 MG tablet Take 1 tablet (10 mg total) by mouth 2 (two) times daily. 03/16/22 03/11/23  Windell Norfolk, MD  metoprolol succinate (TOPROL-XL) 50 MG 24 hr tablet Take 1 tablet (50 mg total) by mouth daily. Take with or immediately following a meal. 12/29/21   Abonza, Maritza, PA-C  Multiple Vitamins-Minerals (MULTIVITAMIN WITH MINERALS) tablet Take 1 tablet by mouth daily.    [provider]  Multiple Vitamins-Minerals (OCUVITE PO) Take 1 tablet by mouth daily.    [provider]  sodium chloride (OCEAN) 0.65 % SOLN nasal spray Place 1 spray into both nostrils as needed for congestion.    [provider]  Dwyane Luo 100-62.5-25 MCG/ACT AEPB Inhale 1 puff by mouth once daily 06/15/22   Abonza, Maritza, PA-C  warfarin (COUMADIN) 5 MG tablet TAKE 1/2 TO 1 TABLET BY MOUTH ONCE DAILY AS DIRECTED BY ANTICOAGULATION CLINIC. 03/29/22   [name], MD    Family History Family History  Problem Relation Age of Onset   Heart disease Mother    Stroke Father     Social History Social History   Tobacco Use   Smoking status: Former    Packs/day: 1.50    Years: 10.00    Total pack years: 15.00    Types: Cigarettes    Quit date: 07/27/1982    Years since quitting: 39.9   Smokeless tobacco: Never  Vaping Use   Vaping Use: Never used  Substance Use Topics   Alcohol use: No   Drug use: No     Allergies   Patient has no known allergies.   Review of Systems Review of Systems   Constitutional:  Negative for chills and fever.  HENT:  Negative for congestion and sore throat.   Eyes:  Negative for discharge and redness.  Respiratory:  Positive for cough. Negative for shortness of breath.   Gastrointestinal:  Negative for diarrhea, nausea and vomiting.     Physical Exam Triage Vital Signs ED Triage Vitals  Enc Vitals Group     BP 07/08/22 1426 120/68     Pulse Rate 07/08/22 1426 81     Resp 07/08/22 1426 17     Temp 07/08/22 1426 98.4 F (36.9 C)     Temp Source 07/08/22 1426 Oral     SpO2 07/08/22 1426 93 %     Weight --      Height --  Head Circumference --      Peak Flow --      Pain Score 07/08/22 1430 0     Pain Loc --      Pain Edu? --      Excl. in GC? --    No data found.  Updated Vital Signs BP 120/68 (BP Location: Left Arm)   Pulse 81   Temp 98.4 F (36.9 C) (Oral)   Resp 17   SpO2 93%      Physical Exam Vitals and nursing note reviewed.  Constitutional:      General: He is not in acute distress.    Appearance: Normal appearance. He is not ill-appearing.  HENT:     Head: Normocephalic and atraumatic.     Nose: Nose normal. No congestion.     Mouth/Throat:     Mouth: Mucous membranes are moist.     Pharynx: Oropharynx is clear. No oropharyngeal exudate or posterior oropharyngeal erythema.  Eyes:     Conjunctiva/sclera: Conjunctivae normal.  Cardiovascular:     Rate and Rhythm: Normal rate and regular rhythm.     Heart sounds: Normal heart sounds. No murmur heard. Pulmonary:     Effort: Pulmonary effort is normal. No respiratory distress.     Breath sounds: No wheezing, rhonchi or rales.     Comments: Breath sounds mildly diminished throughout Skin:    General: Skin is warm and dry.  Neurological:     Mental Status: He is alert.  Psychiatric:        Mood and Affect: Mood normal.        Thought Content: Thought content normal.      UC Treatments / Results  Labs (all labs ordered are listed, but only abnormal  results are displayed) Labs Reviewed - No data to display  EKG   Radiology DG Chest 2 View  Result Date: 07/08/2022 CLINICAL DATA:  Cough and wheezing. EXAM: CHEST - 2 VIEW COMPARISON:  02/02/2022 FINDINGS: The heart size and mediastinal contours are within normal limits. Stable chronic lung disease and bilateral pulmonary scarring. Mild elevation of the right hemidiaphragm. There is no evidence of pulmonary edema, consolidation, pneumothorax, nodule or pleural fluid. The visualized skeletal structures are unremarkable. IMPRESSION: Stable chronic lung disease and bilateral pulmonary scarring. No acute findings. Electronically Signed   By: Irish Lack M.D.   On: 07/08/2022 14:55    Procedures Procedures (including critical Wagner time)  Medications Ordered in UC Medications - No data to display  Initial Impression / Assessment and Plan / UC Course  I have reviewed the triage vital signs and the nursing notes.  Pertinent labs & imaging results that were available during my Wagner of the patient were reviewed by me and considered in my medical decision making (see chart for details).    X-ray without concerning findings.  Will treat to cover COPD exacerbation with steroid burst.  Upon further chart review patient was given antibiotics while hospitalized in April which should have hopefully cleared sinusitis.  Encouraged follow-up if no gradual improvement of symptoms or with any further concerns.  Final Clinical Impressions(s) / UC Diagnoses   Final diagnoses:  COPD exacerbation (HCC)     Discharge Instructions       CXR is without any new findings. Steroid prescribed to cover COPD exacerbation.   Recommend follow up with primary Wagner if he has any persistent symptoms of cough or swelling.  Report to ED with any worsening symptoms.     ED  Prescriptions     Medication Sig Dispense Auth. Provider   predniSONE (DELTASONE) 20 MG tablet Take 2 tablets (40 mg total) by mouth  daily with breakfast for 5 days. 10 tablet Tomi Bamberger, PA-C      PDMP not reviewed this encounter.   Tomi Bamberger, PA-C 07/08/22 1538

## 2022-07-11 ENCOUNTER — Other Ambulatory Visit: Payer: Self-pay | Admitting: Physician Assistant

## 2022-07-11 DIAGNOSIS — J449 Chronic obstructive pulmonary disease, unspecified: Secondary | ICD-10-CM

## 2022-07-12 ENCOUNTER — Telehealth: Payer: Self-pay

## 2022-07-12 DIAGNOSIS — I4891 Unspecified atrial fibrillation: Secondary | ICD-10-CM

## 2022-07-12 NOTE — Telephone Encounter (Signed)
Received call from daughter stating pt was prescribed Prednisone 20mg  daily x 5 days. Pt started Prednisone on Saturday, 07/09/22. Ordered PT/INR to be drawn at [facility] office tomorrow and will call pt's daughter with results and Warfarin dosing. Encouraged pt to eat greens tonight since Prednisone can increase INR.

## 2022-07-13 ENCOUNTER — Ambulatory Visit (INDEPENDENT_AMBULATORY_CARE_PROVIDER_SITE_OTHER): Payer: Medicare Other

## 2022-07-13 DIAGNOSIS — Z7901 Long term (current) use of anticoagulants: Secondary | ICD-10-CM | POA: Diagnosis not present

## 2022-07-13 DIAGNOSIS — I4891 Unspecified atrial fibrillation: Secondary | ICD-10-CM

## 2022-07-13 LAB — PROTIME-INR
INR: 1 (ref 0.9–1.2)
Prothrombin Time: 10.6 s (ref 9.1–12.0)

## 2022-07-13 NOTE — Patient Instructions (Signed)
Description   Take 1.5 tablets today and 1.5 tablets tomorrow and then continue taking 1 tablet daily except 0.5 tablet on Monday, Wednesday and Friday.  Stay consistent with greens (2 times per week)  Recheck INR 1 week.  Coumadin Clinic [phone]

## 2022-07-19 ENCOUNTER — Ambulatory Visit: Payer: Medicare Other | Attending: Cardiology

## 2022-07-19 DIAGNOSIS — I4891 Unspecified atrial fibrillation: Secondary | ICD-10-CM

## 2022-07-19 DIAGNOSIS — Z5181 Encounter for therapeutic drug level monitoring: Secondary | ICD-10-CM

## 2022-07-19 DIAGNOSIS — Z7901 Long term (current) use of anticoagulants: Secondary | ICD-10-CM

## 2022-07-19 LAB — POCT INR: INR: 1.8 — AB (ref 2.0–3.0)

## 2022-07-19 NOTE — Patient Instructions (Signed)
Description   Take 1.5 tablets today and then continue taking 1 tablet daily except 0.5 tablet on Monday, Wednesday and Friday.  Stay consistent with greens (2 times per week)  Recheck INR 1 week.  Coumadin Clinic [phone]

## 2022-07-26 ENCOUNTER — Ambulatory Visit: Payer: Medicare Other | Attending: Cardiology

## 2022-07-26 DIAGNOSIS — Z7901 Long term (current) use of anticoagulants: Secondary | ICD-10-CM | POA: Diagnosis not present

## 2022-07-26 DIAGNOSIS — I4891 Unspecified atrial fibrillation: Secondary | ICD-10-CM | POA: Diagnosis not present

## 2022-07-26 DIAGNOSIS — Z5181 Encounter for therapeutic drug level monitoring: Secondary | ICD-10-CM

## 2022-07-26 LAB — POCT INR: INR: 2.7 (ref 2.0–3.0)

## 2022-07-26 NOTE — Patient Instructions (Signed)
Description   Continue taking 1 tablet daily except 0.5 tablet on Monday, Wednesday and Friday.  Stay consistent with greens (2 times per week)  Recheck INR 3 weeks.  Coumadin Clinic [phone]

## 2022-07-27 ENCOUNTER — Other Ambulatory Visit: Payer: Self-pay

## 2022-07-27 ENCOUNTER — Encounter: Payer: Self-pay | Admitting: Physician Assistant

## 2022-07-27 ENCOUNTER — Encounter: Payer: Self-pay | Admitting: Cardiology

## 2022-07-27 DIAGNOSIS — I10 Essential (primary) hypertension: Secondary | ICD-10-CM

## 2022-07-27 DIAGNOSIS — I4891 Unspecified atrial fibrillation: Secondary | ICD-10-CM

## 2022-07-27 MED ORDER — FINASTERIDE 5 MG PO TABS: 5.0000 mg | ORAL_TABLET | Freq: Every day | ORAL | 0 refills | Status: DC

## 2022-07-27 MED ORDER — METOPROLOL SUCCINATE ER 50 MG PO TB24: 50.0000 mg | ORAL_TABLET | Freq: Every day | ORAL | 0 refills | Status: DC

## 2022-07-27 MED ORDER — CETIRIZINE HCL 10 MG PO TABS: 10.0000 mg | ORAL_TABLET | Freq: Every day | ORAL | 0 refills | Status: DC

## 2022-07-27 MED ORDER — WARFARIN SODIUM 5 MG PO TABS: ORAL_TABLET | ORAL | 0 refills | Status: DC

## 2022-07-27 MED ORDER — FUROSEMIDE 40 MG PO TABS: 40.0000 mg | ORAL_TABLET | Freq: Every day | ORAL | 3 refills | Status: DC

## 2022-07-27 NOTE — Telephone Encounter (Signed)
Prescription refill request received for warfarin Lov: 02/14/22 (Hochrein) Next INR check: 08/18/22 Warfarin tablet strength: 5mg   Appropriate dose and refill sent to requested pharmacy.

## 2022-08-03 DIAGNOSIS — J310 Chronic rhinitis: Secondary | ICD-10-CM | POA: Diagnosis not present

## 2022-08-03 DIAGNOSIS — H6121 Impacted cerumen, right ear: Secondary | ICD-10-CM | POA: Diagnosis not present

## 2022-08-10 ENCOUNTER — Ambulatory Visit (HOSPITAL_COMMUNITY): Payer: Medicare Other | Attending: Physician Assistant

## 2022-08-10 ENCOUNTER — Encounter (INDEPENDENT_AMBULATORY_CARE_PROVIDER_SITE_OTHER): Payer: Medicare Other | Admitting: Ophthalmology

## 2022-08-10 DIAGNOSIS — I77819 Aortic ectasia, unspecified site: Secondary | ICD-10-CM | POA: Insufficient documentation

## 2022-08-10 DIAGNOSIS — M7989 Other specified soft tissue disorders: Secondary | ICD-10-CM | POA: Insufficient documentation

## 2022-08-10 DIAGNOSIS — I350 Nonrheumatic aortic (valve) stenosis: Secondary | ICD-10-CM | POA: Diagnosis not present

## 2022-08-10 DIAGNOSIS — I482 Chronic atrial fibrillation, unspecified: Secondary | ICD-10-CM | POA: Diagnosis not present

## 2022-08-10 LAB — ECHOCARDIOGRAM COMPLETE
AR max vel: 1.08 cm2
AV Area VTI: 0.91 cm2
AV Area mean vel: 0.96 cm2
AV Mean grad: 17.3 mmHg
AV Peak grad: 32.2 mmHg
Ao pk vel: 2.84 m/s
MV M vel: 5.65 m/s
MV Peak grad: 127.7 mmHg
S' Lateral: 2.3 cm

## 2022-08-11 ENCOUNTER — Other Ambulatory Visit: Payer: Self-pay | Admitting: Physician Assistant

## 2022-08-11 DIAGNOSIS — J449 Chronic obstructive pulmonary disease, unspecified: Secondary | ICD-10-CM

## 2022-08-15 ENCOUNTER — Other Ambulatory Visit (HOSPITAL_COMMUNITY): Payer: Medicare Other

## 2022-08-15 NOTE — Progress Notes (Unsigned)
Cardiology Office Note   Date:  08/18/2022   ID:  [name], DOB [birth year], MRN [id number]  PCP:  Mayer Masker, PA-C  Cardiologist:   [name], MD    Chief Complaint  Patient presents with   Dizziness      History of Present Illness: Warren Wagner. is a 87 y.o. male who presents for evaluation of lower extremity swelling.  He has atrial fib and has been on warfarin.   At the last visit I ordered an echo.  LV function is OK.   There were no significant valvular abnormalities.  He has aortic root enlargement.  He was in the hospital in April 2023 with dizziness and orthostatic hypotension.    He had rhabdo and dehydration.  He had a UTI.  He was found to have severe AS.   This had progressed compared with previous studies.   I sent him for follow up echo earlier this month.  The gradient was not changed but this was felt to be severe and CT for calcium level on the aortic valve was suggested.    He comes back with his daughter.  He lives with them.  He gets around with a walker.  He gets himself up and down independently and walks with a walker.  His family helps him shower.  He has some memory problems but he still likes to watch the business channel.  She says he is pretty sharp.  He walks slowly but has not had any falls.  He has some dizziness when standing similar to previous but he manages this conservatively.  He is not feeling his atrial fibrillation.  He is not having any new shortness of breath, PND or orthopnea but again he is somewhat limited.  He denies any chest pressure, neck or arm discomfort.   Past Medical History:  Diagnosis Date   Atrial fibrillation (HCC)    Hearing loss    Hypertension    Prostate enlargement    Umbilical hernia 07/27/2012    Past Surgical History:  Procedure Laterality Date   CARDIOVERSION  10/10/2006   cataracts Bilateral    HERNIA REPAIR  09/10/2012   large umbilical hernia   INSERTION OF MESH  09/10/2012    Procedure: INSERTION OF MESH;  Surgeon: [name], MD;  Location: [facility] OR;  Service: General;  Laterality: N/A;   macular degeneration Bilateral    TONSILLECTOMY     "maybe" (09/10/2012)   UMBILICAL HERNIA REPAIR  09/10/2012   Procedure: HERNIA REPAIR UMBILICAL ADULT;  Surgeon: [name], MD;  Location: [facility] OR;  Service: General;  Laterality: N/A;     Current Outpatient Medications  Medication Sig Dispense Refill   acetaminophen (TYLENOL) 500 MG tablet Take 1,000 mg by mouth every 6 (six) hours as needed for moderate pain or headache.     calcium-vitamin D (OSCAL WITH D) 500-5 MG-MCG tablet Take 1 tablet by mouth daily with breakfast.     cetirizine (ZYRTEC) 10 MG tablet Take 1 tablet (10 mg total) by mouth daily. 90 tablet 0   docusate sodium (COLACE) 100 MG capsule Take 100 mg by mouth at bedtime as needed for mild constipation.     finasteride (PROSCAR) 5 MG tablet Take 1 tablet (5 mg total) by mouth daily. 90 tablet 0   fish oil-omega-3 fatty acids 1000 MG capsule Take 1 g by mouth daily.     furosemide (LASIX) 40 MG tablet Take 1 tablet (40  mg total) by mouth daily. 90 tablet 3   memantine (NAMENDA) 10 MG tablet Take 1 tablet (10 mg total) by mouth 2 (two) times daily. 60 tablet 11   metoprolol succinate (TOPROL-XL) 50 MG 24 hr tablet Take 1 tablet (50 mg total) by mouth daily. Take with or immediately following a meal. 90 tablet 0   Multiple Vitamins-Minerals (MULTIVITAMIN WITH MINERALS) tablet Take 1 tablet by mouth daily.     Multiple Vitamins-Minerals (OCUVITE PO) Take 1 tablet by mouth daily.     sodium chloride (OCEAN) 0.65 % SOLN nasal spray Place 1 spray into both nostrils as needed for congestion.     tobramycin-dexamethasone (TOBRADEX) ophthalmic solution Place 1 drop into both eyes QID.     TRELEGY ELLIPTA 100-62.5-25 MCG/ACT AEPB INHALE 1 PUFF ONCE DAILY 60 each 0   warfarin (COUMADIN) 5 MG tablet TAKE 1/2 TO 1 TABLET BY MOUTH ONCE DAILY AS DIRECTED BY  ANTICOAGULATION CLINIC. 90 tablet 0   erythromycin ophthalmic ointment Place a 1/2 inch ribbon of ointment into the lower eyelid 4 times daily for 7 days. (Patient not taking: Reported on 08/18/2022) 3.5 g 0   melatonin 5 MG TABS Take 5-10 mg by mouth at bedtime as needed (sleep).     No current facility-administered medications for this visit.    Allergies:   Patient has no known allergies.    ROS:  Please see the history of present illness.   Otherwise, review of systems are positive for none.   All other systems are reviewed and negative.    PHYSICAL EXAM: VS:  BP 122/64 (BP Location: Left Arm, Patient Position: Sitting, Cuff Size: Large)   Pulse 69   Ht 6' (1.829 m)   Wt 232 lb 12.8 oz (105.6 kg)   SpO2 98%   BMI 31.57 kg/m  , BMI Body mass index is 31.57 kg/m. GENERAL:  Well appearing NECK:  No jugular venous distention, waveform within normal limits, carotid upstroke brisk and symmetric, no bruits, no thyromegaly LUNGS:  Clear to auscultation bilaterally CHEST:  Unremarkable HEART:  PMI not displaced or sustained,S1 and S2 within normal limits, no S3, no clicks, no rubs, 3 out of 6 apical systolic murmur radiating slightly at aortic outflow tract and mid peaking, no diastolic murmurs, irregular ABD:  Flat, positive bowel sounds normal in frequency in pitch, no bruits, no rebound, no guarding, no midline pulsatile mass, no hepatomegaly, no splenomegaly EXT:  2 plus pulses throughout, no edema, no cyanosis no clubbing    EKG:  EKG is   ordered today. The ekg ordered today demonstrates atrial fibrillation, rate 69, axis rightward, intervals within normal limits, premature ventricular contraction.  No change from previous  Recent Labs: 02/03/2022: TSH 1.860 02/05/2022: Magnesium 2.0 02/10/2022: BNP 260.3 05/04/2022: ALT 28; BUN 19; Creatinine, Ser 1.29; Hemoglobin 16.0; Platelets 173; Potassium 5.2; Sodium 143    Lipid Panel    Component Value Date/Time   CHOL 218 (H)  05/04/2022 0840   TRIG 72 05/04/2022 0840   HDL 58 05/04/2022 0840   CHOLHDL 3.8 05/04/2022 0840   LDLCALC 147 (H) 05/04/2022 0840      Wt Readings from Last 3 Encounters:  08/18/22 232 lb 12.8 oz (105.6 kg)  04/20/22 228 lb (103.4 kg)  03/17/22 225 lb 6.4 oz (102.2 kg)    ECHO:  08/10/2022  1. Tricuspid aortic valve that is severely calcified. Vmax 2.8 m/s, MG 17  mmHG, EOA 0.91 cm2, DI 0.24. Suspect paradoxical low flow low  gradient  severe aortic stenosis due to low SVI (25 cc/m2). Gradients were higher on  last study and also consistent  with severe aortic stenosis. Would recommend aortic valve calcium score  for clarification of aortic stenosis severity. The aortic valve is  tricuspid. There is severe calcifcation of the aortic valve. There is  severe thickening of the aortic valve. Aortic   valve regurgitation is not visualized. Severe aortic valve stenosis.   2. Left ventricular ejection fraction, by estimation, is 65 to 70%. The  left ventricle has normal function. The left ventricle has no regional  wall motion abnormalities. There is moderate concentric left ventricular  hypertrophy. Left ventricular  diastolic function could not be evaluated.   3. Right ventricular systolic function is normal. The right ventricular  size is normal. Tricuspid regurgitation signal is inadequate for assessing  PA pressure.   4. Left atrial size was severely dilated.   5. The mitral valve is degenerative. Mild mitral valve regurgitation. No  evidence of mitral stenosis.   6. Aortic dilatation noted. Aneurysm of the aortic root, measuring 44 mm.  There is mild dilatation of the ascending aorta, measuring 40 mm.   7. The inferior vena cava is normal in size with greater than 50%  respiratory variability, suggesting right atrial pressure of 3 mmHg.   Other studies Reviewed: Additional studies/ records that were reviewed today include: Echocardiogram Review of the above records  demonstrates:  Please see elsewhere in the note.     ASSESSMENT AND PLAN:  CHRONIC ATRIAL FIB:    Mr. Warren Wagner. has a CHA2DS2 - VASc score of 4.  He tolerates anticoagulation and has good rate control.  No change in therapy.  LEG SWELLING:    This is much improved as he is improved from protein calorie malnutrition.  No change in therapy.  AORTIC ROOT ENLARGEMENT: This was 43 mm on CT in 2022 and 40 mm on echo.  Given his frailty we will manage this conservatively.  AORTIC STENOSIS: I have contacted the Structural Heart Team who will order CT to further assess the valve and schedule a new patient consultation with one of the physicians.  Current medicines are reviewed at length with the patient today.  The patient does not have concerns regarding medicines.  The following changes have been made:     Labs/ tests ordered today include:   No orders of the defined types were placed in this encounter.    Disposition:   FU with me after the TAVR consultation  Signed, [name], MD  08/18/2022 10:39 AM    [facility]

## 2022-08-16 DIAGNOSIS — H10501 Unspecified blepharoconjunctivitis, right eye: Secondary | ICD-10-CM | POA: Diagnosis not present

## 2022-08-17 ENCOUNTER — Telehealth: Payer: Self-pay

## 2022-08-17 NOTE — Telephone Encounter (Addendum)
Called patient regarding results. Left message for patient to call office.----- Message from Cannon Kettle, PA-C sent at 08/12/2022  3:58 AM EDT ----- Severe aortic stenosis -  Vmax 2.8 m/s, MG 17 mmHG, EOA 0.91 cm2, DI 0.24. Suspected paradoxical low flow low gradient severe aortic stenosis due to low SVI (25 cc/m2).   Plan: Review with Dr. [name] at appointment next week.

## 2022-08-18 ENCOUNTER — Ambulatory Visit: Payer: Medicare Other | Attending: Cardiology | Admitting: Cardiology

## 2022-08-18 ENCOUNTER — Encounter: Payer: Self-pay | Admitting: Cardiology

## 2022-08-18 ENCOUNTER — Ambulatory Visit (INDEPENDENT_AMBULATORY_CARE_PROVIDER_SITE_OTHER): Payer: Medicare Other | Admitting: *Deleted

## 2022-08-18 VITALS — BP 122/64 | HR 69 | Ht 72.0 in | Wt 232.8 lb

## 2022-08-18 DIAGNOSIS — I482 Chronic atrial fibrillation, unspecified: Secondary | ICD-10-CM | POA: Diagnosis not present

## 2022-08-18 DIAGNOSIS — Z7901 Long term (current) use of anticoagulants: Secondary | ICD-10-CM

## 2022-08-18 DIAGNOSIS — I4891 Unspecified atrial fibrillation: Secondary | ICD-10-CM | POA: Diagnosis not present

## 2022-08-18 DIAGNOSIS — I350 Nonrheumatic aortic (valve) stenosis: Secondary | ICD-10-CM | POA: Diagnosis not present

## 2022-08-18 DIAGNOSIS — Z5181 Encounter for therapeutic drug level monitoring: Secondary | ICD-10-CM

## 2022-08-18 LAB — POCT INR: INR: 3.5 — AB (ref 2.0–3.0)

## 2022-08-18 NOTE — Patient Instructions (Signed)
Description   Do not take any warfarin today then continue taking 1 tablet daily except 0.5 tablet on Monday, Wednesday and Friday. Stay consistent with greens (2 times per week)  Recheck INR 3 weeks. Coumadin Clinic [phone]

## 2022-08-18 NOTE — Patient Instructions (Signed)
Medication Instructions:  Continue same medications *If you need a refill on your cardiac medications before your next appointment, please call your pharmacy*   Lab Work: None ordered   Testing/Procedures: TAVR consult  someone from that dept will call with appointment   Follow-Up: At [facility], you and your health needs are our priority.  As part of our continuing mission to provide you with exceptional heart care, we have created designated Provider Care Teams.  These Care Teams include your primary Cardiologist (physician) and Advanced Practice Providers (APPs -  Physician Assistants and Nurse Practitioners) who all work together to provide you with the care you need, when you need it.  We recommend signing up for the patient portal called "MyChart".  Sign up information is provided on this After Visit Summary.  MyChart is used to connect with patients for Virtual Visits (Telemedicine).  Patients are able to view lab/test results, encounter notes, upcoming appointments, etc.  Non-urgent messages can be sent to your provider as well.   To learn more about what you can do with MyChart, go to [email or web address].    Your next appointment:  2 months    The format for your next appointment: Office   Provider:  Dr.Hochrein   Important Information About Sugar

## 2022-08-23 ENCOUNTER — Encounter: Payer: Medicare Other | Admitting: Physician Assistant

## 2022-08-23 ENCOUNTER — Encounter: Payer: Self-pay | Admitting: Cardiovascular Disease

## 2022-08-23 ENCOUNTER — Ambulatory Visit: Payer: Medicare Other | Attending: Cardiovascular Disease | Admitting: Cardiovascular Disease

## 2022-08-23 VITALS — BP 128/72 | HR 56 | Ht 72.0 in | Wt 231.2 lb

## 2022-08-23 DIAGNOSIS — Z01812 Encounter for preprocedural laboratory examination: Secondary | ICD-10-CM

## 2022-08-23 DIAGNOSIS — I350 Nonrheumatic aortic (valve) stenosis: Secondary | ICD-10-CM

## 2022-08-23 LAB — BASIC METABOLIC PANEL
BUN/Creatinine Ratio: 17 (ref 10–24)
BUN: 22 mg/dL (ref 8–27)
CO2: 27 mmol/L (ref 20–29)
Calcium: 9.8 mg/dL (ref 8.6–10.2)
Chloride: 102 mmol/L (ref 96–106)
Creatinine, Ser: 1.27 mg/dL (ref 0.76–1.27)
Glucose: 85 mg/dL (ref 70–99)
Potassium: 4.4 mmol/L (ref 3.5–5.2)
Sodium: 143 mmol/L (ref 134–144)
eGFR: 55 mL/min/{1.73_m2} — ABNORMAL LOW (ref >59)

## 2022-08-23 LAB — CBC
Hematocrit: 48.3 % (ref 37.5–51.0)
Hemoglobin: 16.2 g/dL (ref 13.0–17.7)
MCH: 29.7 pg (ref 26.6–33.0)
MCHC: 33.5 g/dL (ref 31.5–35.7)
MCV: 89 fL (ref 79–97)
Platelets: 189 10*3/uL (ref 150–450)
RBC: 5.45 x10E6/uL (ref 4.14–5.80)
RDW: 12.9 % (ref 11.6–15.4)
WBC: 9.2 10*3/uL (ref 3.4–10.8)

## 2022-08-23 NOTE — Progress Notes (Unsigned)
[facility] Consult Note  Chief Complaint  Patient presents with   New Patient (Initial Visit)    Severe aortic stenosis    History of Present Illness: 87 yo male with history of atrial fibrillation on coumadin, HTN, thoracic aortic aneurysm and severe aortic stenosis who is here today as a new consult, referred by Dr. [name], for further discussion regarding his aortic stenosis and possible TAVR. He has been followed for moderate aortic stenosis. Most recent echo 08/10/22 with LVEF=65-70%. Dr. Flora Lipps read his echo and felt that he had severe paradoxical low flow/low gradient aortic stenosis. Mean gradient on this echo was 17 mmHg with DI of 0.24, SVI of 25. Mild mitral regurgitation. Dilated aortic root. His mean gradient was 34.5 mmHg by echo in April 2023 with AVA of 0.8 cm2.   He tells me today that he has progressive dyspnea on exertion, fatigue and dizziness. No syncope, LE edema or chest pain. He lives in [place], [place] with his daughters. 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 He goes to the dentist regularly and has no active dental issues.   Primary Care Physician: Mayer Masker, PA-C Primary Cardiologist: [name] Referring Cardiologist: [name]  Past Medical History:  Diagnosis Date   Aortic stenosis    Atrial fibrillation [facility])    COPD (chronic obstructive pulmonary disease) (HCC)    Hearing loss    Hypertension    Prostate enlargement    Umbilical hernia 07/27/2012    Past Surgical History:  Procedure Laterality Date   CARDIOVERSION  10/10/2006   cataracts Bilateral    HERNIA REPAIR  09/10/2012   large umbilical hernia   INSERTION OF MESH  09/10/2012   Procedure: INSERTION OF MESH;  Surgeon: [name], MD;  Location: [facility] OR;  Service: General;  Laterality: N/A;   macular degeneration Bilateral    TONSILLECTOMY     "maybe" (09/10/2012)   UMBILICAL HERNIA REPAIR  09/10/2012   Procedure: HERNIA REPAIR UMBILICAL ADULT;  Surgeon: [name], MD;  Location: [facility] OR;  Service: General;  Laterality: N/A;    Current Outpatient Medications  Medication Sig Dispense Refill   acetaminophen (TYLENOL) 500 MG tablet Take 1,000 mg by mouth every 6 (six) hours as needed for moderate pain or headache.     calcium-vitamin D (OSCAL WITH D) 500-5 MG-MCG tablet Take 1 tablet by mouth daily with breakfast.     cetirizine (ZYRTEC) 10 MG tablet Take 1 tablet (10 mg total) by mouth daily. 90 tablet 0   docusate sodium (COLACE) 100 MG capsule Take 100 mg by mouth daily.     erythromycin ophthalmic ointment Place a 1/2 inch ribbon of ointment into the lower eyelid 4 times daily for 7 days. 3.5 g 0   finasteride (PROSCAR) 5 MG tablet Take 1 tablet (5 mg total) by mouth daily. 90 tablet 0   fish oil-omega-3 fatty acids 1000 MG capsule Take 1 g by mouth daily.     furosemide (LASIX) 40 MG tablet Take 1 tablet (40 mg total) by mouth daily. 90 tablet 3   melatonin 5 MG TABS Take 5-10 mg by mouth at bedtime as needed (sleep).     memantine (NAMENDA) 10 MG tablet Take 1 tablet (10 mg total) by mouth 2 (two) times daily. 60 tablet 11   metoprolol succinate (TOPROL-XL) 50 MG 24 hr tablet Take 1 tablet (50 mg total) by mouth daily. Take with or immediately following a meal. 90 tablet 0   Multiple  Vitamins-Minerals (MULTIVITAMIN WITH MINERALS) tablet Take 1 tablet by mouth daily.     Multiple Vitamins-Minerals (OCUVITE PO) Take 1 tablet by mouth daily.     sodium chloride (OCEAN) 0.65 % SOLN nasal spray Place 1 spray into both nostrils as needed for congestion.     tobramycin-dexamethasone (TOBRADEX) ophthalmic solution Place 1 drop into both eyes QID.     TRELEGY ELLIPTA 100-62.5-25 MCG/ACT AEPB INHALE 1 PUFF ONCE DAILY 60 each 0   warfarin (COUMADIN) 5 MG tablet TAKE 1/2 TO 1 TABLET BY MOUTH ONCE DAILY AS DIRECTED BY ANTICOAGULATION CLINIC. 90 tablet 0   No current facility-administered medications for this visit.    No Known Allergies  Social  History   Socioeconomic History   Marital status: Widowed    Spouse name: Not on file   Number of children: Not on file   Years of education: Not on file   Highest education level: Not on file  Occupational History   Not on file  Tobacco Use   Smoking status: Former    Packs/day: 1.50    Years: 10.00    Total pack years: 15.00    Types: Cigarettes    Quit date: 07/27/1982    Years since quitting: 40.1   Smokeless tobacco: Never  Vaping Use   Vaping Use: Never used  Substance and Sexual Activity   Alcohol use: No   Drug use: No   Sexual activity: Not Currently    Birth control/protection: None  Other Topics Concern   Not on file  Social History Narrative   Lives with 2 daughters. Three children.    Social Determinants of Health   Financial Resource Strain: Not on file  Food Insecurity: Not on file  Transportation Needs: Not on file  Physical Activity: Not on file  Stress: Not on file  Social Connections: Not on file  Intimate Partner Violence: Not on file    Family History  Problem Relation Age of Onset   Heart disease Mother    Stroke Father     Review of Systems:  As stated in the HPI and otherwise negative.   BP 128/72   Pulse (!) 56   Ht 6' (1.829 m)   Wt 231 lb 3.2 oz (104.9 kg)   SpO2 98%   BMI 31.36 kg/m   Physical Examination: General: Well developed, well nourished, NAD  HEENT: OP clear, mucus membranes moist  SKIN: warm, dry. No rashes. Neuro: No focal deficits  Musculoskeletal: Muscle strength 5/5 all ext  Psychiatric: Mood and affect normal  Neck: No JVD, no carotid bruits, no thyromegaly, no lymphadenopathy.  Lungs:Clear bilaterally, no wheezes, rhonci, crackles Cardiovascular: Regular rate and rhythm. Loud, harsh, late peaking systolic murmur.  Abdomen:Soft. Bowel sounds present. Non-tender.  Extremities: No No lower extremity edema. Pulses are 2 + in the bilateral DP/PT.  EKG:  EKG is not ordered today. The ekg ordered today  demonstrates   Echo 08/10/22:  1. Tricuspid aortic valve that is severely calcified. Vmax 2.8 m/s, MG 17  mmHG, EOA 0.91 cm2, DI 0.24. Suspect paradoxical low flow low gradient  severe aortic stenosis due to low SVI (25 cc/m2). Gradients were higher on  last study and also consistent  with severe aortic stenosis. Would recommend aortic valve calcium score  for clarification of aortic stenosis severity. The aortic valve is  tricuspid. There is severe calcifcation of the aortic valve. There is  severe thickening of the aortic valve. Aortic   valve regurgitation is not  visualized. Severe aortic valve stenosis.   2. Left ventricular ejection fraction, by estimation, is 65 to 70%. The  left ventricle has normal function. The left ventricle has no regional  wall motion abnormalities. There is moderate concentric left ventricular  hypertrophy. Left ventricular  diastolic function could not be evaluated.   3. Right ventricular systolic function is normal. The right ventricular  size is normal. Tricuspid regurgitation signal is inadequate for assessing  PA pressure.   4. Left atrial size was severely dilated.   5. The mitral valve is degenerative. Mild mitral valve regurgitation. No  evidence of mitral stenosis.   6. Aortic dilatation noted. Aneurysm of the aortic root, measuring 44 mm.  There is mild dilatation of the ascending aorta, measuring 40 mm.   7. The inferior vena cava is normal in size with greater than 50%  respiratory variability, suggesting right atrial pressure of 3 mmHg.   Comparison(s): No significant change from prior study. Severe aortic  stenosis is still present.   FINDINGS   Left Ventricle: Left ventricular ejection fraction, by estimation, is 65  to 70%. The left ventricle has normal function. The left ventricle has no  regional wall motion abnormalities. The left ventricular internal cavity  size was normal in size. There is   moderate concentric left ventricular  hypertrophy. Left ventricular  diastolic function could not be evaluated due to atrial fibrillation. Left  ventricular diastolic function could not be evaluated.   Right Ventricle: The right ventricular size is normal. No increase in  right ventricular wall thickness. Right ventricular systolic function is  normal. Tricuspid regurgitation signal is inadequate for assessing PA  pressure.   Left Atrium: Left atrial size was severely dilated.   Right Atrium: Right atrial size was normal in size.   Pericardium: There is no evidence of pericardial effusion.   Mitral Valve: The mitral valve is degenerative in appearance. Mild to  moderate mitral annular calcification. Mild mitral valve regurgitation. No  evidence of mitral valve stenosis.   Tricuspid Valve: The tricuspid valve is grossly normal. Tricuspid valve  regurgitation is trivial. No evidence of tricuspid stenosis.   Aortic Valve: Tricuspid aortic valve that is severely calcified. Vmax 2.8  m/s, MG 17 mmHG, EOA 0.91 cm2, DI 0.24. Suspect paradoxical low flow low  gradient severe aortic stenosis due to low SVI (25 cc/m2). Gradients were  higher on last study and also  consistent with severe aortic stenosis. Would recommend aortic valve  calcium score for clarification of aortic stenosis severity. The aortic  valve is tricuspid. There is severe calcifcation of the aortic valve.  There is severe thickening of the aortic  valve. Aortic valve regurgitation is not visualized. Severe aortic  stenosis is present. Aortic valve mean gradient measures 17.3 mmHg. Aortic  valve peak gradient measures 32.2 mmHg. Aortic valve area, by VTI measures  0.91 cm.   Pulmonic Valve: The pulmonic valve was grossly normal. Pulmonic valve  regurgitation is mild. No evidence of pulmonic stenosis.   Aorta: Aortic dilatation noted. There is mild dilatation of the ascending  aorta, measuring 40 mm. There is an aneurysm involving the aortic root   measuring 44 mm.   Venous: The inferior vena cava is normal in size with greater than 50%  respiratory variability, suggesting right atrial pressure of 3 mmHg.   IAS/Shunts: The atrial septum is grossly normal.     LEFT VENTRICLE  PLAX 2D  LVIDd:         4.30 cm  LVIDs:         2.30 cm  LV PW:         1.50 cm  LV IVS:        1.60 cm  LVOT diam:     2.20 cm  LV SV:         56  LV SV Index:   25  LVOT Area:     3.80 cm     RIGHT VENTRICLE  RV Basal diam:  3.50 cm  RV S prime:     12.17 cm/s  TAPSE (M-mode): 2.8 cm   LEFT ATRIUM              Index        RIGHT ATRIUM           Index  LA diam:        5.20 cm  2.31 cm/m   RA Area:     17.30 cm  LA Vol (A2C):   126.0 ml 55.94 ml/m  RA Volume:   42.80 ml  19.00 ml/m  LA Vol (A4C):   139.0 ml 61.71 ml/m  LA Biplane Vol: 142.0 ml 63.04 ml/m   AORTIC VALVE  AV Area (Vmax):    1.08 cm  AV Area (Vmean):   0.96 cm  AV Area (VTI):     0.91 cm  AV Vmax:           283.67 cm/s  AV Vmean:          191.000 cm/s  AV VTI:            0.616 m  AV Peak Grad:      32.2 mmHg  AV Mean Grad:      17.3 mmHg  LVOT Vmax:         80.67 cm/s  LVOT Vmean:        48.400 cm/s  LVOT VTI:          0.148 m  LVOT/AV VTI ratio: 0.24    AORTA  Ao Root diam: 4.40 cm  Ao Asc diam:  4.00 cm   MR Peak grad: 127.7 mmHg  MR Mean grad: 80.0 mmHg   SHUNTS  MR Vmax:      565.00 cm/s Systemic VTI:  0.15 m  MR Vmean:     408.7 cm/s  Systemic Diam: 2.20 cm   Recent Labs: 02/03/2022: TSH 1.860 02/05/2022: Magnesium 2.0 02/10/2022: BNP 260.3 05/04/2022: ALT 28; BUN 19; Creatinine, Ser 1.29; Hemoglobin 16.0; Platelets 173; Potassium 5.2; Sodium 143   Lipid Panel    Component Value Date/Time   CHOL 218 (H) 05/04/2022 0840   TRIG 72 05/04/2022 0840   HDL 58 05/04/2022 0840   CHOLHDL 3.8 05/04/2022 0840   LDLCALC 147 (H) 05/04/2022 0840     Wt Readings from Last 3 Encounters:  08/23/22 231 lb 3.2 oz (104.9 kg)  08/18/22 232 lb 12.8 oz (105.6 kg)   04/20/22 228 lb (103.4 kg)      Assessment and Plan:   1. Severe Aortic Valve Stenosis: He has severe paradoxical low flow/low gradient (stage D2) aortic valve stenosis. NYHA class 2-3. I have personally reviewed the echo images. The aortic valve is thickened, calcified with limited leaflet mobility. I think he would benefit from AVR. Given advanced age, he is not a good candidate for conventional AVR by surgical approach. I think he may be a good candidate for TAVR.   I have reviewed the natural history of aortic stenosis  with the patient and their family members  who are present today. We have discussed the limitations of medical therapy and the poor prognosis associated with symptomatic aortic stenosis. We have reviewed potential treatment options, including palliative medical therapy, conventional surgical aortic valve replacement, and transcatheter aortic valve replacement. We discussed treatment options in the context of the patient's specific comorbid medical conditions.   He would like to proceed with planning for TAVR. I will arrange a right and left heart catheterization at [facility] 09/09/22 at 10:30 am. Risks and benefits of the cath procedure and the valve procedure are reviewed with the patient. After the cath, he will have a cardiac CT, CTA of the chest/abdomen and pelvis and will then be referred to see one of the CT surgeons on our TAVR team.     Labs/ tests ordered today include:  No orders of the defined types were placed in this encounter.  Disposition:   F/U with the valve team   Signed, Verne Carrow, MD, [facility] 08/23/2022 9:47 AM    [facility] [address] [place], [place], [place]  [address] Phone: [phone]; Fax: [phone]

## 2022-08-23 NOTE — Progress Notes (Addendum)
Pre Surgical Assessment: 5 M Walk Test  70M=16.83ft  5 Meter Walk Test- trial 1: 8.25 seconds 5 Meter Walk Test- trial 2: 7.53 seconds 5 Meter Walk Test- trial 3: 7.51 seconds 5 Meter Walk Test Average: 7.76 seconds    STS score: 2.95%

## 2022-08-23 NOTE — Patient Instructions (Addendum)
Medication Instructions:  No changes *If you need a refill on your cardiac medications before your next appointment, please call your pharmacy*   Lab Work: Today: bmet, cbc   Testing/Procedures: Your physician has requested that you have a cardiac catheterization. Cardiac catheterization is used to diagnose and/or treat various heart conditions. Doctors may recommend this procedure for a number of different reasons. The most common reason is to evaluate chest pain. Chest pain can be a symptom of coronary artery disease (CAD), and cardiac catheterization can show whether plaque is narrowing or blocking your heart's arteries. This procedure is also used to evaluate the valves, as well as measure the blood flow and oxygen levels in different parts of your heart. For further information please visit [email or web address] Please follow instruction sheet, as given.   Follow-Up: Per Structural Heart Team         Cardiac/Peripheral Catheterization   You are scheduled for a Cardiac Catheterization on Friday, December 1 with Dr. Verne Carrow.  1. Please arrive at the Main Entrance A at [facility]: [address] [place], [place] [address] on December 1 at 8:30 AM (This time is two hours before your procedure to ensure your preparation). Free valet parking service is available. You will check in at ADMITTING. The support person will be asked to wait in the waiting room.  It is OK to have someone drop you off and come back when you are ready to be discharged.        Special note: Every effort is made to have your procedure done on time. Please understand that emergencies sometimes delay scheduled procedures.   . 2. Diet: Do not eat solid foods after midnight.  You may have clear liquids until 5 AM the day of the procedure.  3. Labs: You will need to have blood drawn today.  4. Medication instructions in preparation for your procedure:  HOLD COUMADIN (WARFARIN) FOR 5 DAYS  PRIOR. --no coumadin after Saturday November 25.  Contrast Allergy: No   On the morning of your procedure, take Aspirin 81 mg and any morning medicines NOT listed above.  You may use sips of water.  5. Plan to go home the same day, you will only stay overnight if medically necessary. 6. You MUST have a responsible adult to drive you home. 7. An adult MUST be with you the first 24 hours after you arrive home. 8. Bring a current list of your medications, and the last time and date medication taken. 9. Bring ID and current insurance cards. 10.Please wear clothes that are easy to get on and off and wear slip-on shoes.  Thank you for allowing [place] to care for you!   -- [facility]

## 2022-08-23 NOTE — H&P (View-Only) (Signed)
[facility] Consult Note  Chief Complaint  Patient presents with   New Patient (Initial Visit)    Severe aortic stenosis    History of Present Illness: 87 yo male with history of atrial fibrillation on coumadin, HTN, thoracic aortic aneurysm and severe aortic stenosis who is here today as a new consult, referred by Dr. Percival Spanish, for further discussion regarding his aortic stenosis and possible TAVR. He has been followed for moderate aortic stenosis. Most recent echo 08/10/22 with LVEF=65-70%. Dr. Audie Box read his echo and felt that he had severe paradoxical low flow/low gradient aortic stenosis. Mean gradient on this echo was 17 mmHg with DI of 0.24, SVI of 25. Mild mitral regurgitation. Dilated aortic root. His mean gradient was 34.5 mmHg by echo in April 2023 with AVA of 0.8 cm2.   He tells me today that he has progressive dyspnea on exertion, fatigue and dizziness. No syncope, LE edema or chest pain. He lives in [place], [place] with his daughters. 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 He goes to the dentist regularly and has no active dental issues.   Primary Care Physician: Lorrene Reid, PA-C Primary Cardiologist: Percival Spanish Referring Cardiologist: Percival Spanish  Past Medical History:  Diagnosis Date   Aortic stenosis    Atrial fibrillation [facility])    COPD (chronic obstructive pulmonary disease) ([place])    Hearing loss    Hypertension    Prostate enlargement    Umbilical hernia [id number]    Past Surgical History:  Procedure Laterality Date   CARDIOVERSION  10/10/2006   cataracts Bilateral    HERNIA REPAIR  [id number]   large umbilical hernia   INSERTION OF MESH  09/10/2012   Procedure: INSERTION OF MESH;  Surgeon: Zenovia Jarred, MD;  Location: [place];  Service: General;  Laterality: N/A;   macular degeneration Bilateral    TONSILLECTOMY     "maybe" (0000000)   UMBILICAL HERNIA REPAIR  09/10/2012   Procedure: HERNIA REPAIR UMBILICAL ADULT;  Surgeon: Zenovia Jarred, MD;  Location: [place];  Service: General;  Laterality: N/A;    Current Outpatient Medications  Medication Sig Dispense Refill   acetaminophen (TYLENOL) 500 MG tablet Take 1,000 mg by mouth every 6 (six) hours as needed for moderate pain or headache.     calcium-vitamin D (OSCAL WITH D) 500-5 MG-MCG tablet Take 1 tablet by mouth daily with breakfast.     cetirizine (ZYRTEC) 10 MG tablet Take 1 tablet (10 mg total) by mouth daily. 90 tablet 0   docusate sodium (COLACE) 100 MG capsule Take 100 mg by mouth daily.     erythromycin ophthalmic ointment Place a 1/2 inch ribbon of ointment into the lower eyelid 4 times daily for 7 days. 3.5 g 0   finasteride (PROSCAR) 5 MG tablet Take 1 tablet (5 mg total) by mouth daily. 90 tablet 0   fish oil-omega-3 fatty acids 1000 MG capsule Take 1 g by mouth daily.     furosemide (LASIX) 40 MG tablet Take 1 tablet (40 mg total) by mouth daily. 90 tablet 3   melatonin 5 MG TABS Take 5-10 mg by mouth at bedtime as needed (sleep).     memantine (NAMENDA) 10 MG tablet Take 1 tablet (10 mg total) by mouth 2 (two) times daily. 60 tablet 11   metoprolol succinate (TOPROL-XL) 50 MG 24 hr tablet Take 1 tablet (50 mg total) by mouth daily. Take with or immediately following a meal. 90 tablet 0   Multiple  Vitamins-Minerals (MULTIVITAMIN WITH MINERALS) tablet Take 1 tablet by mouth daily.     Multiple Vitamins-Minerals (OCUVITE PO) Take 1 tablet by mouth daily.     sodium chloride (OCEAN) 0.65 % SOLN nasal spray Place 1 spray into both nostrils as needed for congestion.     tobramycin-dexamethasone (TOBRADEX) ophthalmic solution Place 1 drop into both eyes QID.     TRELEGY ELLIPTA 100-62.5-25 MCG/ACT AEPB INHALE 1 PUFF ONCE DAILY 60 each 0   warfarin (COUMADIN) 5 MG tablet TAKE 1/2 TO 1 TABLET BY MOUTH ONCE DAILY AS DIRECTED BY ANTICOAGULATION CLINIC. 90 tablet 0   No current facility-administered medications for this visit.    No Known Allergies  Social  History   Socioeconomic History   Marital status: Widowed    Spouse name: Not on file   Number of children: 3   Years of education: Not on file   Highest education level: Not on file  Occupational History   Occupation: Truck Geophysicist/field seismologist  Tobacco Use   Smoking status: Former    Packs/day: 1.50    Years: 10.00    Total pack years: 15.00    Types: Cigarettes    Quit date: 07/27/1982    Years since quitting: 40.1   Smokeless tobacco: Never  Vaping Use   Vaping Use: Never used  Substance and Sexual Activity   Alcohol use: No   Drug use: No   Sexual activity: Not Currently    Birth control/protection: None  Other Topics Concern   Not on file  Social History Narrative   Lives with 2 daughters. Three children.    Social Determinants of Health   Financial Resource Strain: Not on file  Food Insecurity: Not on file  Transportation Needs: Not on file  Physical Activity: Not on file  Stress: Not on file  Social Connections: Not on file  Intimate Partner Violence: Not on file    Family History  Problem Relation Age of Onset   Heart disease Mother    Stroke Father     Review of Systems:  As stated in the HPI and otherwise negative.   BP 128/72   Pulse (!) 56   Ht 6' (1.829 m)   Wt 231 lb 3.2 oz (104.9 kg)   SpO2 98%   BMI 31.36 kg/m   Physical Examination: General: Well developed, well nourished, NAD  HEENT: OP clear, mucus membranes moist  SKIN: warm, dry. No rashes. Neuro: No focal deficits  Musculoskeletal: Muscle strength 5/5 all ext  Psychiatric: Mood and affect normal  Neck: No JVD, no carotid bruits, no thyromegaly, no lymphadenopathy.  Lungs:Clear bilaterally, no wheezes, rhonci, crackles Cardiovascular: Regular rate and rhythm. Loud, harsh, late peaking systolic murmur.  Abdomen:Soft. Bowel sounds present. Non-tender.  Extremities: No No lower extremity edema. Pulses are 2 + in the bilateral DP/PT.  EKG:  EKG is not ordered today. The ekg ordered today  demonstrates   Echo 08/10/22:  1. Tricuspid aortic valve that is severely calcified. Vmax 2.8 m/s, MG 17  mmHG, EOA 0.91 cm2, DI 0.24. Suspect paradoxical low flow low gradient  severe aortic stenosis due to low SVI (25 cc/m2). Gradients were higher on  last study and also consistent  with severe aortic stenosis. Would recommend aortic valve calcium score  for clarification of aortic stenosis severity. The aortic valve is  tricuspid. There is severe calcifcation of the aortic valve. There is  severe thickening of the aortic valve. Aortic   valve regurgitation is not visualized. Severe  aortic valve stenosis.   2. Left ventricular ejection fraction, by estimation, is 65 to 70%. The  left ventricle has normal function. The left ventricle has no regional  wall motion abnormalities. There is moderate concentric left ventricular  hypertrophy. Left ventricular  diastolic function could not be evaluated.   3. Right ventricular systolic function is normal. The right ventricular  size is normal. Tricuspid regurgitation signal is inadequate for assessing  PA pressure.   4. Left atrial size was severely dilated.   5. The mitral valve is degenerative. Mild mitral valve regurgitation. No  evidence of mitral stenosis.   6. Aortic dilatation noted. Aneurysm of the aortic root, measuring 44 mm.  There is mild dilatation of the ascending aorta, measuring 40 mm.   7. The inferior vena cava is normal in size with greater than 50%  respiratory variability, suggesting right atrial pressure of 3 mmHg.   Comparison(s): No significant change from prior study. Severe aortic  stenosis is still present.   FINDINGS   Left Ventricle: Left ventricular ejection fraction, by estimation, is 65  to 70%. The left ventricle has normal function. The left ventricle has no  regional wall motion abnormalities. The left ventricular internal cavity  size was normal in size. There is   moderate concentric left ventricular  hypertrophy. Left ventricular  diastolic function could not be evaluated due to atrial fibrillation. Left  ventricular diastolic function could not be evaluated.   Right Ventricle: The right ventricular size is normal. No increase in  right ventricular wall thickness. Right ventricular systolic function is  normal. Tricuspid regurgitation signal is inadequate for assessing PA  pressure.   Left Atrium: Left atrial size was severely dilated.   Right Atrium: Right atrial size was normal in size.   Pericardium: There is no evidence of pericardial effusion.   Mitral Valve: The mitral valve is degenerative in appearance. Mild to  moderate mitral annular calcification. Mild mitral valve regurgitation. No  evidence of mitral valve stenosis.   Tricuspid Valve: The tricuspid valve is grossly normal. Tricuspid valve  regurgitation is trivial. No evidence of tricuspid stenosis.   Aortic Valve: Tricuspid aortic valve that is severely calcified. Vmax 2.8  m/s, MG 17 mmHG, EOA 0.91 cm2, DI 0.24. Suspect paradoxical low flow low  gradient severe aortic stenosis due to low SVI (25 cc/m2). Gradients were  higher on last study and also  consistent with severe aortic stenosis. Would recommend aortic valve  calcium score for clarification of aortic stenosis severity. The aortic  valve is tricuspid. There is severe calcifcation of the aortic valve.  There is severe thickening of the aortic  valve. Aortic valve regurgitation is not visualized. Severe aortic  stenosis is present. Aortic valve mean gradient measures 17.3 mmHg. Aortic  valve peak gradient measures 32.2 mmHg. Aortic valve area, by VTI measures  0.91 cm.   Pulmonic Valve: The pulmonic valve was grossly normal. Pulmonic valve  regurgitation is mild. No evidence of pulmonic stenosis.   Aorta: Aortic dilatation noted. There is mild dilatation of the ascending  aorta, measuring 40 mm. There is an aneurysm involving the aortic root   measuring 44 mm.   Venous: The inferior vena cava is normal in size with greater than 50%  respiratory variability, suggesting right atrial pressure of 3 mmHg.   IAS/Shunts: The atrial septum is grossly normal.     LEFT VENTRICLE  PLAX 2D  LVIDd:         4.30 cm  LVIDs:  2.30 cm  LV PW:         1.50 cm  LV IVS:        1.60 cm  LVOT diam:     2.20 cm  LV SV:         56  LV SV Index:   25  LVOT Area:     3.80 cm     RIGHT VENTRICLE  RV Basal diam:  3.50 cm  RV S prime:     12.17 cm/s  TAPSE (M-mode): 2.8 cm   LEFT ATRIUM              Index        RIGHT ATRIUM           Index  LA diam:        5.20 cm  2.31 cm/m   RA Area:     17.30 cm  LA Vol (A2C):   126.0 ml 55.94 ml/m  RA Volume:   42.80 ml  19.00 ml/m  LA Vol (A4C):   139.0 ml 61.71 ml/m  LA Biplane Vol: 142.0 ml 63.04 ml/m   AORTIC VALVE  AV Area (Vmax):    1.08 cm  AV Area (Vmean):   0.96 cm  AV Area (VTI):     0.91 cm  AV Vmax:           283.67 cm/s  AV Vmean:          191.000 cm/s  AV VTI:            0.616 m  AV Peak Grad:      32.2 mmHg  AV Mean Grad:      17.3 mmHg  LVOT Vmax:         80.67 cm/s  LVOT Vmean:        48.400 cm/s  LVOT VTI:          0.148 m  LVOT/AV VTI ratio: 0.24    AORTA  Ao Root diam: 4.40 cm  Ao Asc diam:  4.00 cm   MR Peak grad: 127.7 mmHg  MR Mean grad: 80.0 mmHg   SHUNTS  MR Vmax:      565.00 cm/s Systemic VTI:  0.15 m  MR Vmean:     408.7 cm/s  Systemic Diam: 2.20 cm   Recent Labs: 02/03/2022: TSH 1.860 02/05/2022: Magnesium 2.0 02/10/2022: BNP 260.3 05/04/2022: ALT 28 08/23/2022: BUN 22; Creatinine, Ser 1.27; Hemoglobin 16.2; Platelets 189; Potassium 4.4; Sodium 143   Lipid Panel    Component Value Date/Time   CHOL 218 (H) 05/04/2022 0840   TRIG 72 05/04/2022 0840   HDL 58 05/04/2022 0840   CHOLHDL 3.8 05/04/2022 0840   LDLCALC 147 (H) 05/04/2022 0840     Wt Readings from Last 3 Encounters:  08/23/22 231 lb 3.2 oz (104.9 kg)  08/18/22 232 lb 12.8 oz  (105.6 kg)  04/20/22 228 lb (103.4 kg)      Assessment and Plan:   1. Severe Aortic Valve Stenosis: He has severe paradoxical low flow/low gradient (stage D2) aortic valve stenosis. NYHA class 2-3. I have personally reviewed the echo images. The aortic valve is thickened, calcified with limited leaflet mobility. I think he would benefit from AVR. Given advanced age, he is not a good candidate for conventional AVR by surgical approach. I think he may be a good candidate for TAVR.   I have reviewed the natural history of aortic stenosis with the patient and their family members  who are present today. We have discussed the limitations of medical therapy and the poor prognosis associated with symptomatic aortic stenosis. We have reviewed potential treatment options, including palliative medical therapy, conventional surgical aortic valve replacement, and transcatheter aortic valve replacement. We discussed treatment options in the context of the patient's specific comorbid medical conditions.   He would like to proceed with planning for TAVR. I will arrange a right and left heart catheterization at [facility] 09/09/22 at 10:30 am. Risks and benefits of the cath procedure and the valve procedure are reviewed with the patient. After the cath, he will have a cardiac CT, CTA of the chest/abdomen and pelvis and will then be referred to see one of the CT surgeons on our TAVR team.     Labs/ tests ordered today include:   Orders Placed This Encounter  Procedures   CBC   Basic metabolic panel   Disposition:   F/U with the valve team   Signed, Lauree Chandler, MD, [facility] 08/24/2022 8:08 AM    [address], [place], [place]  [id number] Phone: [phone]; Fax: [phone]

## 2022-08-24 ENCOUNTER — Telehealth: Payer: Self-pay | Admitting: *Deleted

## 2022-08-24 NOTE — Telephone Encounter (Signed)
-----   Message from Lendon Ka, RN sent at 08/24/2022 10:41 AM EST ----- Regarding: R & L heart cath 09/09/22 Hello, This patient is scheduled for left and right heart cath on Friday Dec 1 at 10:30 am with Dr. Clifton James.  Dx: severe aortic stenosis. Labs and EKG are up to date.  His KCCQ and are done. Both of his daughters live w him.  He is available for scans anytime and they use MyChart.   He takes warfarin and knows to stop after Saturday 11/25. He has an INR check on 11/28. I let him know one of our Coumadin nurses would reach out to him to changes this appointment if needed.    Thank you! Michalene

## 2022-08-24 NOTE — Telephone Encounter (Addendum)
Received a msg that pt is scheduled for a Heart Cath on 09/09/2022. Pt will be holding warfarin after 09/03/2022 for the 09/09/2022 procedure. Since pt has an appt on 09/06/2022 will need to reschedule that appt.   Spoke with dtr, Warren Wagner, and advised since he will be holding warfarin we need to reschedule appt. Moved appt down to the next week. Also, advised dtr that when he starts his warfarin back to take an extra 1/2 tablet of warfarin for 2 days (12/1-1 tab & 12/2-1.5 tablets) then resume normal dose. She verbalized understanding.

## 2022-09-08 ENCOUNTER — Telehealth: Payer: Self-pay | Admitting: *Deleted

## 2022-09-08 NOTE — Telephone Encounter (Signed)
Error

## 2022-09-08 NOTE — Telephone Encounter (Signed)
Cardiac Catheterization scheduled at [facility] for: Friday September 09, 2022 10:30 AM Arrival time and place: [facility] Main Entrance A at: 8:30 AM  Nothing to eat after midnight prior to procedure, clear liquids until 5 AM day of procedure.  Medication instructions: -Hold:  Coumadin-none 09/04/22 until post procedure  Lasix-day before and day of procedure-per protocol-GFR 55-pt already taken today. -Except hold medications usual morning medications can be taken with sips of water including aspirin 81 mg.  Confirmed patient has responsible adult to drive home post procedure and be with patient first 24 hours after arriving home.  Patient reports no new symptoms concerning for COVID-19 in the past 10 days.  Reviewed procedure instructions with patient's daughter (DPR), Juliette.

## 2022-09-09 ENCOUNTER — Encounter (HOSPITAL_COMMUNITY)
Admission: RE | Disposition: A | Discharge status: Home / Self Care | Payer: Self-pay | Source: Home / Self Care | Attending: Cardiovascular Disease

## 2022-09-09 ENCOUNTER — Ambulatory Visit (HOSPITAL_COMMUNITY)
Admission: RE | Admit: 2022-09-09 | Discharge: 2022-09-09 | Disposition: A | Discharge status: Home / Self Care | Payer: Medicare Other | Attending: Cardiovascular Disease | Admitting: Cardiovascular Disease

## 2022-09-09 ENCOUNTER — Encounter (HOSPITAL_COMMUNITY): Payer: Self-pay | Admitting: Cardiovascular Disease

## 2022-09-09 ENCOUNTER — Other Ambulatory Visit: Payer: Self-pay

## 2022-09-09 DIAGNOSIS — Z01812 Encounter for preprocedural laboratory examination: Secondary | ICD-10-CM

## 2022-09-09 DIAGNOSIS — Z87891 Personal history of nicotine dependence: Secondary | ICD-10-CM | POA: Insufficient documentation

## 2022-09-09 DIAGNOSIS — I2582 Chronic total occlusion of coronary artery: Secondary | ICD-10-CM | POA: Insufficient documentation

## 2022-09-09 DIAGNOSIS — Z7901 Long term (current) use of anticoagulants: Secondary | ICD-10-CM | POA: Insufficient documentation

## 2022-09-09 DIAGNOSIS — I712 Thoracic aortic aneurysm, without rupture, unspecified: Secondary | ICD-10-CM | POA: Diagnosis not present

## 2022-09-09 DIAGNOSIS — I2510 Atherosclerotic heart disease of native coronary artery without angina pectoris: Secondary | ICD-10-CM | POA: Insufficient documentation

## 2022-09-09 DIAGNOSIS — I350 Nonrheumatic aortic (valve) stenosis: Secondary | ICD-10-CM

## 2022-09-09 DIAGNOSIS — I2584 Coronary atherosclerosis due to calcified coronary lesion: Secondary | ICD-10-CM | POA: Diagnosis not present

## 2022-09-09 DIAGNOSIS — I10 Essential (primary) hypertension: Secondary | ICD-10-CM | POA: Diagnosis not present

## 2022-09-09 DIAGNOSIS — I4891 Unspecified atrial fibrillation: Secondary | ICD-10-CM | POA: Insufficient documentation

## 2022-09-09 HISTORY — PX: RIGHT HEART CATH AND CORONARY ANGIOGRAPHY: CATH118264

## 2022-09-09 LAB — POCT I-STAT EG7
Acid-Base Excess: 2 mmol/L (ref 0.0–2.0)
Bicarbonate: 27.8 mmol/L (ref 20.0–28.0)
Calcium, Ion: 1.17 mmol/L (ref 1.15–1.40)
HCT: 43 % (ref 39.0–52.0)
Hemoglobin: 14.6 g/dL (ref 13.0–17.0)
O2 Saturation: 64 %
Potassium: 3.8 mmol/L (ref 3.5–5.1)
Sodium: 142 mmol/L (ref 135–145)
TCO2: 29 mmol/L (ref 22–32)
pCO2, Ven: 47.3 mmHg (ref 44–60)
pH, Ven: 7.377 (ref 7.25–7.43)
pO2, Ven: 34 mmHg (ref 32–45)

## 2022-09-09 LAB — POCT I-STAT 7, (LYTES, BLD GAS, ICA,H+H)
Acid-Base Excess: 0 mmol/L (ref 0.0–2.0)
Bicarbonate: 24.4 mmol/L (ref 20.0–28.0)
Calcium, Ion: 1.11 mmol/L — ABNORMAL LOW (ref 1.15–1.40)
HCT: 41 % (ref 39.0–52.0)
Hemoglobin: 13.9 g/dL (ref 13.0–17.0)
O2 Saturation: 90 %
Potassium: 3.6 mmol/L (ref 3.5–5.1)
Sodium: 143 mmol/L (ref 135–145)
TCO2: 26 mmol/L (ref 22–32)
pCO2 arterial: 39.4 mmHg (ref 32–48)
pH, Arterial: 7.4 (ref 7.35–7.45)
pO2, Arterial: 59 mmHg — ABNORMAL LOW (ref 83–108)

## 2022-09-09 LAB — PROTIME-INR
INR: 1.1 (ref 0.8–1.2)
Prothrombin Time: 14.4 seconds (ref 11.4–15.2)

## 2022-09-09 SURGERY — RIGHT HEART CATH AND CORONARY ANGIOGRAPHY
Anesthesia: LOCAL

## 2022-09-09 MED ORDER — SODIUM CHLORIDE 0.9 % IV SOLN: INTRAVENOUS | Status: AC

## 2022-09-09 MED ORDER — LABETALOL HCL 5 MG/ML IV SOLN: 10.0000 mg | INTRAVENOUS | Status: DC | PRN

## 2022-09-09 MED ORDER — ACETAMINOPHEN 325 MG PO TABS: 650.0000 mg | ORAL_TABLET | ORAL | Status: DC | PRN

## 2022-09-09 MED ORDER — HEPARIN (PORCINE) IN NACL 1000-0.9 UT/500ML-% IV SOLN
INTRAVENOUS | Status: AC
  Filled 2022-09-09: qty 500

## 2022-09-09 MED ORDER — HEPARIN (PORCINE) IN NACL 1000-0.9 UT/500ML-% IV SOLN
INTRAVENOUS | Status: DC | PRN
  Administered 2022-09-09 (×2): 500 mL

## 2022-09-09 MED ORDER — MIDAZOLAM HCL 2 MG/2ML IJ SOLN
INTRAMUSCULAR | Status: DC | PRN
  Administered 2022-09-09: 1 mg via INTRAVENOUS

## 2022-09-09 MED ORDER — SODIUM CHLORIDE 0.9% FLUSH: 3.0000 mL | Freq: Two times a day (BID) | INTRAVENOUS | Status: DC

## 2022-09-09 MED ORDER — SODIUM CHLORIDE 0.9 % WEIGHT BASED INFUSION
3.0000 mL/kg/h | INTRAVENOUS | Status: AC
  Administered 2022-09-09: 3 mL/kg/h via INTRAVENOUS

## 2022-09-09 MED ORDER — SODIUM CHLORIDE 0.9 % IV SOLN: 250.0000 mL | INTRAVENOUS | Status: DC | PRN

## 2022-09-09 MED ORDER — FENTANYL CITRATE (PF) 100 MCG/2ML IJ SOLN
INTRAMUSCULAR | Status: AC
  Filled 2022-09-09: qty 2

## 2022-09-09 MED ORDER — LIDOCAINE HCL (PF) 1 % IJ SOLN
INTRAMUSCULAR | Status: DC | PRN
  Administered 2022-09-09 (×2): 2 mL via INTRADERMAL

## 2022-09-09 MED ORDER — SODIUM CHLORIDE 0.9% FLUSH: 3.0000 mL | INTRAVENOUS | Status: DC | PRN

## 2022-09-09 MED ORDER — IOHEXOL 350 MG/ML SOLN
INTRAVENOUS | Status: DC | PRN
  Administered 2022-09-09: 140 mL

## 2022-09-09 MED ORDER — LIDOCAINE HCL (PF) 1 % IJ SOLN
INTRAMUSCULAR | Status: AC
  Filled 2022-09-09: qty 30

## 2022-09-09 MED ORDER — ONDANSETRON HCL 4 MG/2ML IJ SOLN: 4.0000 mg | Freq: Four times a day (QID) | INTRAMUSCULAR | Status: DC | PRN

## 2022-09-09 MED ORDER — FENTANYL CITRATE (PF) 100 MCG/2ML IJ SOLN
INTRAMUSCULAR | Status: DC | PRN
  Administered 2022-09-09: 25 ug via INTRAVENOUS

## 2022-09-09 MED ORDER — HYDRALAZINE HCL 20 MG/ML IJ SOLN: 10.0000 mg | INTRAMUSCULAR | Status: DC | PRN

## 2022-09-09 MED ORDER — SODIUM CHLORIDE 0.9 % WEIGHT BASED INFUSION: 1.0000 mL/kg/h | INTRAVENOUS | Status: DC

## 2022-09-09 MED ORDER — HEPARIN SODIUM (PORCINE) 1000 UNIT/ML IJ SOLN
INTRAMUSCULAR | Status: AC
  Filled 2022-09-09: qty 10

## 2022-09-09 MED ORDER — VERAPAMIL HCL 2.5 MG/ML IV SOLN
INTRAVENOUS | Status: DC | PRN
  Administered 2022-09-09: 10 mL via INTRA_ARTERIAL

## 2022-09-09 MED ORDER — HEPARIN SODIUM (PORCINE) 1000 UNIT/ML IJ SOLN
INTRAMUSCULAR | Status: DC | PRN
  Administered 2022-09-09: 5000 [IU] via INTRAVENOUS

## 2022-09-09 MED ORDER — MIDAZOLAM HCL 2 MG/2ML IJ SOLN
INTRAMUSCULAR | Status: AC
  Filled 2022-09-09: qty 2

## 2022-09-09 MED ORDER — ASPIRIN 81 MG PO CHEW: 81.0000 mg | CHEWABLE_TABLET | ORAL | Status: DC

## 2022-09-09 MED ORDER — VERAPAMIL HCL 2.5 MG/ML IV SOLN
INTRAVENOUS | Status: AC
  Filled 2022-09-09: qty 2

## 2022-09-09 SURGICAL SUPPLY — 17 items
CATH 5FR JL3.5 JR4 ANG PIG MP (CATHETERS) IMPLANT
CATH INFINITI 5 FR 3DRC (CATHETERS) IMPLANT
CATH INFINITI 5 FR AR1 MOD (CATHETERS) IMPLANT
CATH LAUNCHER 5F RADR (CATHETERS) IMPLANT
CATH SWAN GANZ 7F STRAIGHT (CATHETERS) IMPLANT
CATHETER LAUNCHER 5F RADR (CATHETERS) ×1
DEVICE RAD COMP TR BAND LRG (VASCULAR PRODUCTS) IMPLANT
GLIDESHEATH SLEND SS 6F .021 (SHEATH) IMPLANT
GLIDESHEATH SLENDER 7FR .021G (SHEATH) IMPLANT
GUIDEWIRE .025 260CM (WIRE) IMPLANT
GUIDEWIRE INQWIRE 1.5J.035X260 (WIRE) IMPLANT
INQWIRE 1.5J .035X260CM (WIRE) ×1
KIT HEART LEFT (KITS) ×1 IMPLANT
PACK CARDIAC CATHETERIZATION (CUSTOM PROCEDURE TRAY) ×1 IMPLANT
SHEATH PROBE COVER 6X72 (BAG) IMPLANT
TRANSDUCER W/STOPCOCK (MISCELLANEOUS) ×1 IMPLANT
TUBING CIL FLEX 10 FLL-RA (TUBING) ×1 IMPLANT

## 2022-09-09 NOTE — Interval H&P Note (Signed)
History and Physical Interval Note:  09/09/2022 10:19 AM  Warren Wagner.  has presented today for surgery, with the diagnosis of aortic stenosis.  The various methods of treatment have been discussed with the patient and family. After consideration of risks, benefits and other options for treatment, the patient has consented to  Procedure(s): RIGHT/LEFT HEART CATH AND CORONARY ANGIOGRAPHY (N/A) as a surgical intervention.  The patient's history has been reviewed, patient examined, no change in status, stable for surgery.  I have reviewed the patient's chart and labs.  Questions were answered to the patient's satisfaction.    Cath Lab Visit (complete for each Cath Lab visit)  Clinical Evaluation Leading to the Procedure:   ACS: No.  Non-ACS:    Anginal Classification: CCS II  Anti-ischemic medical therapy: Minimal Therapy (1 class of medications)  Non-Invasive Test Results: No non-invasive testing performed  Prior CABG: No previous CABG        Verne Carrow

## 2022-09-09 NOTE — Discharge Instructions (Signed)

## 2022-09-11 ENCOUNTER — Encounter: Payer: Self-pay | Admitting: Cardiology

## 2022-09-13 ENCOUNTER — Encounter: Payer: Self-pay | Admitting: Cardiovascular Disease

## 2022-09-13 ENCOUNTER — Ambulatory Visit: Payer: Medicare Other | Attending: Cardiology

## 2022-09-13 DIAGNOSIS — Z7901 Long term (current) use of anticoagulants: Secondary | ICD-10-CM | POA: Diagnosis not present

## 2022-09-13 DIAGNOSIS — Z5181 Encounter for therapeutic drug level monitoring: Secondary | ICD-10-CM | POA: Diagnosis not present

## 2022-09-13 DIAGNOSIS — I4891 Unspecified atrial fibrillation: Secondary | ICD-10-CM | POA: Diagnosis not present

## 2022-09-13 LAB — POCT INR: INR: 2 (ref 2.0–3.0)

## 2022-09-13 NOTE — Patient Instructions (Signed)
Description   Continue taking 1 tablet daily except 0.5 tablet on Monday, Wednesday and Friday.  Stay consistent with greens (2 times per week)  Recheck INR 2 weeks  [facility] #[phone]      

## 2022-09-14 ENCOUNTER — Other Ambulatory Visit: Payer: Self-pay

## 2022-09-14 DIAGNOSIS — J449 Chronic obstructive pulmonary disease, unspecified: Secondary | ICD-10-CM

## 2022-09-14 MED ORDER — TRELEGY ELLIPTA 100-62.5-25 MCG/ACT IN AEPB: INHALATION_SPRAY | RESPIRATORY_TRACT | 0 refills | Status: DC

## 2022-09-16 ENCOUNTER — Ambulatory Visit (HOSPITAL_COMMUNITY)
Admission: RE | Admit: 2022-09-16 | Discharge: 2022-09-16 | Disposition: A | Discharge status: Home / Self Care | Payer: Medicare Other | Source: Ambulatory Visit | Attending: Cardiovascular Disease | Admitting: Cardiovascular Disease

## 2022-09-16 ENCOUNTER — Encounter: Payer: Self-pay | Admitting: Cardiovascular Disease

## 2022-09-16 DIAGNOSIS — J9811 Atelectasis: Secondary | ICD-10-CM | POA: Diagnosis not present

## 2022-09-16 DIAGNOSIS — N281 Cyst of kidney, acquired: Secondary | ICD-10-CM | POA: Diagnosis not present

## 2022-09-16 DIAGNOSIS — I350 Nonrheumatic aortic (valve) stenosis: Secondary | ICD-10-CM

## 2022-09-16 DIAGNOSIS — N200 Calculus of kidney: Secondary | ICD-10-CM | POA: Diagnosis not present

## 2022-09-16 MED ORDER — IOHEXOL 350 MG/ML SOLN
100.0000 mL | Freq: Once | INTRAVENOUS | Status: AC | PRN
  Administered 2022-09-16: 100 mL via INTRAVENOUS

## 2022-09-21 ENCOUNTER — Encounter: Payer: Medicare Other | Admitting: Nurse Practitioner

## 2022-09-26 ENCOUNTER — Other Ambulatory Visit: Payer: Self-pay

## 2022-09-26 ENCOUNTER — Encounter: Payer: Self-pay | Admitting: Cardiology

## 2022-09-26 ENCOUNTER — Emergency Department (HOSPITAL_COMMUNITY)
Admission: EM | Admit: 2022-09-26 | Discharge: 2022-09-26 | Disposition: A | Discharge status: Home / Self Care | Payer: Medicare Other | Attending: Emergency Medicine | Admitting: Emergency Medicine

## 2022-09-26 ENCOUNTER — Encounter (HOSPITAL_COMMUNITY): Payer: Self-pay

## 2022-09-26 ENCOUNTER — Emergency Department (HOSPITAL_COMMUNITY): Payer: Medicare Other

## 2022-09-26 ENCOUNTER — Encounter: Payer: Self-pay | Admitting: Cardiovascular Disease

## 2022-09-26 DIAGNOSIS — R42 Dizziness and giddiness: Secondary | ICD-10-CM | POA: Diagnosis not present

## 2022-09-26 DIAGNOSIS — R531 Weakness: Secondary | ICD-10-CM

## 2022-09-26 DIAGNOSIS — Z20822 Contact with and (suspected) exposure to covid-19: Secondary | ICD-10-CM | POA: Diagnosis not present

## 2022-09-26 DIAGNOSIS — E860 Dehydration: Secondary | ICD-10-CM | POA: Diagnosis not present

## 2022-09-26 DIAGNOSIS — I951 Orthostatic hypotension: Secondary | ICD-10-CM

## 2022-09-26 DIAGNOSIS — I509 Heart failure, unspecified: Secondary | ICD-10-CM | POA: Insufficient documentation

## 2022-09-26 DIAGNOSIS — R110 Nausea: Secondary | ICD-10-CM | POA: Diagnosis not present

## 2022-09-26 DIAGNOSIS — I4821 Permanent atrial fibrillation: Secondary | ICD-10-CM | POA: Diagnosis not present

## 2022-09-26 DIAGNOSIS — I10 Essential (primary) hypertension: Secondary | ICD-10-CM | POA: Diagnosis not present

## 2022-09-26 DIAGNOSIS — I110 Hypertensive heart disease with heart failure: Secondary | ICD-10-CM | POA: Insufficient documentation

## 2022-09-26 DIAGNOSIS — Z79899 Other long term (current) drug therapy: Secondary | ICD-10-CM | POA: Diagnosis not present

## 2022-09-26 DIAGNOSIS — Z7901 Long term (current) use of anticoagulants: Secondary | ICD-10-CM | POA: Insufficient documentation

## 2022-09-26 DIAGNOSIS — I4891 Unspecified atrial fibrillation: Secondary | ICD-10-CM | POA: Diagnosis not present

## 2022-09-26 DIAGNOSIS — I350 Nonrheumatic aortic (valve) stenosis: Secondary | ICD-10-CM

## 2022-09-26 DIAGNOSIS — R55 Syncope and collapse: Secondary | ICD-10-CM | POA: Diagnosis not present

## 2022-09-26 LAB — BASIC METABOLIC PANEL
Anion gap: 11 (ref 5–15)
BUN: 19 mg/dL (ref 8–23)
CO2: 23 mmol/L (ref 22–32)
Calcium: 9.2 mg/dL (ref 8.9–10.3)
Chloride: 108 mmol/L (ref 98–111)
Creatinine, Ser: 1.1 mg/dL (ref 0.61–1.24)
GFR, Estimated: >60 mL/min (ref >60)
Glucose, Bld: 95 mg/dL (ref 70–99)
Potassium: 4.1 mmol/L (ref 3.5–5.1)
Sodium: 142 mmol/L (ref 135–145)

## 2022-09-26 LAB — URINALYSIS, ROUTINE W REFLEX MICROSCOPIC
Bilirubin Urine: NEGATIVE
Glucose, UA: NEGATIVE mg/dL
Hgb urine dipstick: NEGATIVE
Ketones, ur: NEGATIVE mg/dL
Nitrite: NEGATIVE
Protein, ur: NEGATIVE mg/dL
Specific Gravity, Urine: 1.021 (ref 1.005–1.030)
pH: 5 (ref 5.0–8.0)

## 2022-09-26 LAB — RESP PANEL BY RT-PCR (RSV, FLU A&B, COVID)  RVPGX2
Influenza A by PCR: NEGATIVE
Influenza B by PCR: NEGATIVE
Resp Syncytial Virus by PCR: NEGATIVE
SARS Coronavirus 2 by RT PCR: NEGATIVE

## 2022-09-26 LAB — CBC
HCT: 50.7 % (ref 39.0–52.0)
Hemoglobin: 16.7 g/dL (ref 13.0–17.0)
MCH: 30.1 pg (ref 26.0–34.0)
MCHC: 32.9 g/dL (ref 30.0–36.0)
MCV: 91.5 fL (ref 80.0–100.0)
Platelets: 161 10*3/uL (ref 150–400)
RBC: 5.54 MIL/uL (ref 4.22–5.81)
RDW: 13.2 % (ref 11.5–15.5)
WBC: 7 10*3/uL (ref 4.0–10.5)
nRBC: 0 % (ref 0.0–0.2)

## 2022-09-26 LAB — TROPONIN I (HIGH SENSITIVITY)
Troponin I (High Sensitivity): 9 ng/L (ref <18)
Troponin I (High Sensitivity): 11 ng/L (ref <18)

## 2022-09-26 LAB — PROTIME-INR
INR: 2.6 — ABNORMAL HIGH (ref 0.8–1.2)
Prothrombin Time: 27.8 seconds — ABNORMAL HIGH (ref 11.4–15.2)

## 2022-09-26 LAB — BRAIN NATRIURETIC PEPTIDE: B Natriuretic Peptide: 90.1 pg/mL (ref 0.0–100.0)

## 2022-09-26 NOTE — ED Triage Notes (Signed)
EMS stated, pt had a dizzy spell around 0500 especially if he tries to stand. A-fib on monitor. Hx of dementia 20 g IV right AC Zofran 4mg  IV

## 2022-09-26 NOTE — ED Provider Notes (Signed)
[facility] EMERGENCY DEPARTMENT Provider Note   CSN: [id number] Arrival date & time: 09/26/22  1010     History  Chief Complaint  Patient presents with   Dizziness    Warren Wagner. is a 87 y.o. male.  HPI    87 year old male comes in with chief complaint of dizziness. Patient has history of A-fib, thoracic artery aneurysm, hypertension and aortic valve stenosis awaiting TAVR.  He resides at his home.  Patient's daughter is at the bedside, she provides meaningful history as well.  According to the patient, he woke up and noted that he had some dizziness.  He normally has dizziness, but the dizziness was perhaps more pronounced.  Later on the day when he was walking to the bathroom, he became unsteady with his gait.  Daughter noted the unsteady gait and went to assist him immediately.  She states that patient did not lose consciousness but was less responsive and needed her assistance and sitting down.  Patient denies any chest pain, palpitations, shortness of breath, nausea, sweating prior to this episode.  Daughter thinks that the patient was perhaps slightly clammy.  There was no seizure-like activity.  Patient was answering questions appropriately.  No recent illnesses.  Patient denies any headache, neck pain, one-sided weakness, numbness, vision change and any dizziness at rest.  There is no history of stroke.  Home Medications Prior to Admission medications   Medication Sig Start Date End Date Taking? Authorizing Provider  acetaminophen (TYLENOL) 500 MG tablet Take 1,000 mg by mouth every 6 (six) hours as needed for moderate pain or headache.    [provider]  Calcium Carb-Cholecalciferol (OYSTER SHELL CALCIUM/D3 PO) Take 600 tablets by mouth daily with breakfast.    [provider]  cetirizine (ZYRTEC) 10 MG tablet Take 1 tablet (10 mg total) by mouth daily. 07/27/22   Mayer Masker, PA-C  docusate sodium (COLACE) 100 MG capsule  Take 100 mg by mouth daily.    [provider]  finasteride (PROSCAR) 5 MG tablet Take 1 tablet (5 mg total) by mouth daily. 07/27/22   Mayer Masker, PA-C  Fluticasone-Umeclidin-Vilant (TRELEGY ELLIPTA) 100-62.5-25 MCG/ACT AEPB INHALE 1 PUFF ONCE DAILY 09/14/22   Carlean Jews, NP  furosemide (LASIX) 40 MG tablet Take 1 tablet (40 mg total) by mouth daily. 07/27/22   [name], MD  memantine (NAMENDA) 10 MG tablet Take 1 tablet (10 mg total) by mouth 2 (two) times daily. 03/16/22 03/11/23  Windell Norfolk, MD  metoprolol succinate (TOPROL-XL) 50 MG 24 hr tablet Take 1 tablet (50 mg total) by mouth daily. Take with or immediately following a meal. 07/27/22   Abonza, Maritza, PA-C  Multiple Vitamins-Minerals (MULTIVITAMIN WITH MINERALS) tablet Take 1 tablet by mouth daily.    [provider]  Multiple Vitamins-Minerals (OCUVITE PO) Take 1 tablet by mouth daily.    [provider]  OMEGA-3 FATTY ACIDS PO Take 500 mg by mouth daily.    [provider]  sodium chloride (OCEAN) 0.65 % SOLN nasal spray Place 1 spray into both nostrils daily as needed for congestion.    [provider]  tobramycin-dexamethasone Wallene Dales) ophthalmic solution Place 1 drop into both eyes QID.    [provider]  warfarin (COUMADIN) 5 MG tablet TAKE 1/2 TO 1 TABLET BY MOUTH ONCE DAILY AS DIRECTED BY ANTICOAGULATION CLINIC. Patient taking differently: Take 2.5-5 mg by mouth See admin instructions. Take 2.5 mg on Monday,Wednesday and Friday all the other days  take 5 mg in the morning DAILY AS DIRECTED BY ANTICOAGULATION CLINIC. 07/27/22   Minus Breeding, MD      Allergies    Patient has no known allergies.    Review of Systems   Review of Systems  All other systems reviewed and are negative.   Physical Exam Updated Vital Signs BP 122/62   Pulse 66   Temp 98.4 F (36.9 C)   Resp 18   Ht 6' (1.829 m)   Wt 108.9 kg   SpO2 94%   BMI 32.55 kg/m  Physical  Exam Vitals and nursing note reviewed.  Constitutional:      Appearance: He is well-developed.  HENT:     Head: Atraumatic.  Eyes:     Extraocular Movements: Extraocular movements intact.     Pupils: Pupils are equal, round, and reactive to light.     Comments: No nystagmus  Cardiovascular:     Rate and Rhythm: Normal rate.  Pulmonary:     Effort: Pulmonary effort is normal.  Musculoskeletal:     Cervical back: Neck supple.  Skin:    General: Skin is warm.  Neurological:     Mental Status: He is alert and oriented to person, place, and time.     Cranial Nerves: No cranial nerve deficit.     Sensory: No sensory deficit.     Motor: No weakness.     Coordination: Coordination normal.     ED Results / Procedures / Treatments   Labs (all labs ordered are listed, but only abnormal results are displayed) Labs Reviewed  URINALYSIS, ROUTINE W REFLEX MICROSCOPIC - Abnormal; Notable for the following components:      Result Value   Leukocytes,Ua TRACE (*)    Bacteria, UA RARE (*)    All other components within normal limits  RESP PANEL BY RT-PCR (RSV, FLU A&B, COVID)  RVPGX2  BASIC METABOLIC PANEL  CBC  BRAIN NATRIURETIC PEPTIDE  PROTIME-INR  CBG MONITORING, ED  TROPONIN I (HIGH SENSITIVITY)  TROPONIN I (HIGH SENSITIVITY)    EKG EKG Interpretation  Date/Time:  Monday September 26 2022 10:22:15 EST Ventricular Rate:  74 PR Interval:    QRS Duration: 96 QT Interval:  412 QTC Calculation: 457 R Axis:   101 Text Interpretation: Atrial fibrillation with premature ventricular or aberrantly conducted complexes Rightward axis Septal infarct , age undetermined Abnormal ECG When compared with ECG of 04-Feb-2022 06:29, PREVIOUS ECG IS PRESENT No acute changes No significant change since last tracing Confirmed by Varney Biles [id number]) on 09/26/2022 4:54:23 PM  Radiology DG Chest 2 View  Result Date: 09/26/2022 CLINICAL DATA:  Near syncope.  Weakness. EXAM: CHEST - 2 VIEW  COMPARISON:  July 08, 2022 FINDINGS: Stable cardiomegaly. The hila and mediastinum are unremarkable. No pneumothorax. Mild opacity in the left base. No other acute abnormalities. IMPRESSION: Suspected small left effusion with underlying opacity. No other acute abnormalities. Electronically Signed   By: Dorise Bullion III M.D.   On: 09/26/2022 10:59    Procedures Procedures    Medications Ordered in ED Medications - No data to display  ED Course/ Medical Decision Making/ A&P                           Medical Decision Making Amount and/or Complexity of Data Reviewed Labs: ordered.   This patient presents to the ED with chief complaint(s) of dizziness with pertinent past medical history of A-fib on Coumadin, severe aortic stenosis  awaiting TAVR.The complaint involves an extensive differential diagnosis and also carries with it a high risk of complications and morbidity.    The differential diagnosis includes : It does not appear that patient had syncope or near syncope, but he had sudden change in his gait along with severe weakness.  Differential diagnosis considered in this patient's situation includes orthostatic dizziness, severe electrolyte abnormality, dehydration, COVID-19/flu, arrhythmia secondary to aortic stenosis, symptomatic aortic stenosis, TIA.  Patient neuroexam is reassuring.  Currently he is hemodynamically stable.  I will discuss the case with cardiology service to see if they feel his symptoms could be result of symptomatic aortic stenosis.  Disposition pending cardiology consultation and lab evaluation.  Additional history obtained: Additional history obtained from family, daughter who provides meaningful history Records reviewed  cardiology notes, echocardiogram showing severe aortic stenosis  Independent labs interpretation:  The following labs were independently interpreted: No severe anemia, no leukocytosis and UA shows no ketones.  Electrolytes are  reassuring as well.  Treatment and Reassessment: Patient reassessed on 2 separate occasions.  I ambulated him personally.  He had no ataxia.  He walked without any issues.  He did not feel dizzy.  On second assessment, indicates that he still feels fine.  He has not had any dizzy spells.  Neuroexam remains nonfocal.  Cardiology team at the bedside.  Consultation: - Consulted or discussed management/test interpretation with external professional:  Cardiology service has assessed the patient independently.  They recommend changing the Lasix to 20 mg daily and additional 20 mg as needed in place of 40 mg daily.  Patient aware of this plan.  They will also ensure he gets close follow-up.  Patient and the daughter are comfortable with the plan of discharge with close follow-up.  Patient lives with 2 of his daughters, which is reassuring.   Final Clinical Impression(s) / ED Diagnoses Final diagnoses:  Weakness  Dehydration  Aortic valve stenosis, etiology of cardiac valve disease unspecified    Rx / DC Orders ED Discharge Orders     None         Varney Biles, MD 09/26/22 [phone]

## 2022-09-26 NOTE — ED Provider Triage Note (Signed)
Emergency Medicine Provider Triage Evaluation Note  Chason Mciver. , a 87 y.o. male  was evaluated in triage.  Pt complains of a dizzy spell this morning described as a feeling of nearly passing out.  No associated chest pain or shortness of breath.  It occurred when he stood up.  No preceding nausea, vomiting, diarrhea.  Denies history of black or bloody stools.  He does have history of atrial fibrillation.  Transported by EMS.  Review of Systems  Positive: Dizziness Negative: Nausea, vomiting  Physical Exam  BP (!) 142/70 (BP Location: Right Arm)   Pulse 75   Resp 17   Ht 6' (1.829 m)   Wt 108.9 kg   SpO2 91%   BMI 32.55 kg/m  Gen:   Awake, no distress   Resp:  Normal effort  MSK:   Moves extremities without difficulty  Other:    Medical Decision Making  Medically screening exam initiated at 10:25 AM.  Appropriate orders placed.  [name] was informed that the remainder of the evaluation will be completed by another provider, this initial triage assessment does not replace that evaluation, and the importance of remaining in the ED until their evaluation is complete.     Renne Crigler, PA-C 09/26/22 1030

## 2022-09-26 NOTE — Discharge Instructions (Addendum)
You were seen in the ER after you had weakness that led to loss of your tone/posture.  The workup in the emergency room is reassuring. Cardiac enzymes are normal.  No anemia.  No signs of infection.  Urine does not show any infection or profound dehydration.  Given your severe aortic stenosis, we did discuss the case with cardiology service.  They recommend that you can be safely discharged with a change in your Lasix to 20 mg daily.  Additional 20 mg can be taken only as needed for weight gain/shortness of breath.  We recommend that you get daily morning weight checks. They will ensure that you get a close follow-up with their service.

## 2022-09-26 NOTE — Consult Note (Signed)
Cardiology Consult:   Patient ID: Warren Wagner.; MRN: [id number]; DOB: [birth year]   Admission date: 09/26/2022  Primary Care Provider: Lorrene Reid, PA-C Primary Cardiologist: Hochrein  Chief Complaint:  Near syncope  Patient Profile:   Warren Dahman. is a 87 y.o. male with a history of P-LFLGAS and Dementia NOS seen for near syncope  History of Present Illness:   Warren Wagner is feeling short of breath at baseline.  He was seen with TAVR Team evaluation and had fatigue DOE and near syncope.  He lives with two daughters and a nephew.  The daugther not present notes in the past that he complains of being unsteady on his feet.    This AM he noted that we was feeling fatigued.  Didn't want to go to the living room to watch TV and watched it in his room.  Didn't want to eat breakfast.  When he got up, he felt weak and fatigued.  Felt weak and dizzy and couldn't walk to the bath room.  Has had no chest pain, chest pressure, chest tightness, chest stinging. No shortness of breath, no change in DOE. No weight gain. Notes  no palpitations or funny heart beats.   He is asymptomatic of what appears to be permanent atrial fibrillation with rare PVCs.  He leg swelling is much improved on his current diuretic dose.   He has a chart history of dementia. He does ADLs but does not cook or do housework. 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 He watches a lot or TV. He does spend time monitoring his Archivist.   Past Medical History:  Diagnosis Date   Aortic stenosis    Atrial fibrillation (HCC)    COPD (chronic obstructive pulmonary disease) ([place])    Hearing loss    Hypertension    Prostate enlargement    Umbilical hernia [id number]    Past Surgical History:  Procedure Laterality Date   CARDIOVERSION  10/10/2006   cataracts Bilateral    HERNIA REPAIR  [id number]   large umbilical hernia   INSERTION OF MESH  09/10/2012   Procedure: INSERTION OF MESH;   Surgeon: Zenovia Jarred, MD;  Location: [facility];  Service: General;  Laterality: N/A;   macular degeneration Bilateral    RIGHT HEART CATH AND CORONARY ANGIOGRAPHY N/A 09/09/2022   Procedure: RIGHT HEART CATH AND CORONARY ANGIOGRAPHY;  Surgeon: Burnell Blanks, MD;  Location: [facility];  Service: Cardiovascular;  Laterality: N/A;   TONSILLECTOMY     "maybe" ([phone])   UMBILICAL HERNIA REPAIR  09/10/2012   Procedure: HERNIA REPAIR UMBILICAL ADULT;  Surgeon: Zenovia Jarred, MD;  Location: [facility];  Service: General;  Laterality: N/A;     Medications Prior to Admission: Prior to Admission medications   Medication Sig Start Date End Date Taking? Authorizing Provider  acetaminophen (TYLENOL) 500 MG tablet Take 1,000 mg by mouth every 6 (six) hours as needed for moderate pain or headache.    [provider]  Calcium Carb-Cholecalciferol (OYSTER SHELL CALCIUM/D3 PO) Take 600 tablets by mouth daily with breakfast.    [provider]  cetirizine (ZYRTEC) 10 MG tablet Take 1 tablet (10 mg total) by mouth daily. 07/27/22   Lorrene Reid, PA-C  docusate sodium (COLACE) 100 MG capsule Take 100 mg by mouth daily.    [provider]  finasteride (PROSCAR) 5 MG tablet Take 1 tablet (5 mg total) by mouth daily. 07/27/22   Abonza,  Maritza, PA-C  Fluticasone-Umeclidin-Vilant (TRELEGY ELLIPTA) 100-62.5-25 MCG/ACT AEPB INHALE 1 PUFF ONCE DAILY 09/14/22   Ronnell Freshwater, NP  furosemide (LASIX) 40 MG tablet Take 1 tablet (40 mg total) by mouth daily. 07/27/22   Minus Breeding, MD  memantine (NAMENDA) 10 MG tablet Take 1 tablet (10 mg total) by mouth 2 (two) times daily. 03/16/22 03/11/23  Alric Ran, MD  metoprolol succinate (TOPROL-XL) 50 MG 24 hr tablet Take 1 tablet (50 mg total) by mouth daily. Take with or immediately following a meal. 07/27/22   Abonza, Maritza, PA-C  Multiple Vitamins-Minerals (MULTIVITAMIN WITH MINERALS) tablet Take 1 tablet by mouth daily.     [provider]  Multiple Vitamins-Minerals (OCUVITE PO) Take 1 tablet by mouth daily.    [provider]  OMEGA-3 FATTY ACIDS PO Take 500 mg by mouth daily.    [provider]  sodium chloride (OCEAN) 0.65 % SOLN nasal spray Place 1 spray into both nostrils daily as needed for congestion.    [provider]  tobramycin-dexamethasone [facility]) ophthalmic solution Place 1 drop into both eyes QID.    [provider]  warfarin (COUMADIN) 5 MG tablet TAKE 1/2 TO 1 TABLET BY MOUTH ONCE DAILY AS DIRECTED BY ANTICOAGULATION CLINIC. Patient taking differently: Take 2.5-5 mg by mouth See admin instructions. Take 2.5 mg on Monday,Wednesday and Friday all the other days take 5 mg in the morning DAILY AS DIRECTED BY ANTICOAGULATION CLINIC. 07/27/22   Minus Breeding, MD     Allergies:   No Known Allergies  Social History:   Social History   Socioeconomic History   Marital status: Widowed    Spouse name: Not on file   Number of children: 3   Years of education: Not on file   Highest education level: Not on file  Occupational History   Occupation: Truck Geophysicist/field seismologist  Tobacco Use   Smoking status: Former    Packs/day: 1.50    Years: 10.00    Total pack years: 15.00    Types: Cigarettes    Quit date: 07/27/1982    Years since quitting: 40.1   Smokeless tobacco: Never  Vaping Use   Vaping Use: Never used  Substance and Sexual Activity   Alcohol use: No   Drug use: No   Sexual activity: Not Currently    Birth control/protection: None  Other Topics Concern   Not on file  Social History Narrative   Lives with 2 daughters. Three children.    Social Determinants of Health   Financial Resource Strain: Not on file  Food Insecurity: Not on file  Transportation Needs: Not on file  Physical Activity: Not on file  Stress: Not on file  Social Connections: Not on file  Intimate Partner Violence: Not on file    Family History:   The patient's family  history includes Heart disease in his mother; Stroke in his father.    ROS:  Please see the history of present illness.  All other ROS reviewed and negative.     Physical Exam/Data:   Vitals:   09/26/22 1340 09/26/22 1520 09/26/22 1615 09/26/22 1740  BP: (!) 134/95 138/85 138/86 125/69  Pulse: 70 78 67 63  Resp: 18 16 18 17   Temp: 98.1 F (36.7 C) 98.4 F (36.9 C)    TempSrc: Oral     SpO2: 92% 95% 97% 94%  Weight:      Height:       No intake or output data in the 24  hours ending 09/26/22 1836 Filed Weights   09/26/22 1019  Weight: 108.9 kg   Body mass index is 32.55 kg/m.   Gen: no distress, elderly male Neck: No JVD,  Ears: bilateral Frank Sign Cardiac: No Rubs or Gallops, systolic murmur, IRIR rhythm Respiratory: Clear to auscultation bilaterally, normal effort, normal  respiratory rate GI: Soft, nontender, non-distended  MS: No  edema but with chronic changes Neuro:  At time of evaluation, alert and oriented to person/place/time; unclear if to situation Psych: Normal affect, patient feels great   EKG:  The ECG that was done  was personally reviewed and demonstrates AF with rare PVC rates 70s  Relevant CV Studies: Cardiac Studies & Procedures   CARDIAC CATHETERIZATION  CARDIAC CATHETERIZATION 09/09/2022  Narrative   Prox RCA to Dist RCA lesion is 20% stenosed.   2nd Mrg lesion is 80% stenosed.   Ost LAD to Mid LAD lesion is 40% stenosed.   Mid LAD to Dist LAD lesion is 100% stenosed.   1st Diag lesion is 80% stenosed.   2nd Diag lesion is 90% stenosed.  CTO of the mid LAD. Severe disease in the moderate caliber diagonal branch Moderate caliber Circumflex with severe stenosis in the moderate caliber obtuse marginal branch Large dominant RCA. I could not engage this vessel selectively. The proximal, mid and distal vessel is patent with mild to moderate calcific disease. I could not visualize the distal branches or potential collaterals from the RCA to the  distal LAD Normal right and left heart pressures  Recommendations: medical management of CAD. Continue workup for TAVR.  Findings Coronary Findings Diagnostic  Dominance: Right  Left Anterior Descending Vessel is large. Ost LAD to Mid LAD lesion is 40% stenosed. The lesion is calcified. Mid LAD to Dist LAD lesion is 100% stenosed. The lesion is chronically occluded.  First Diagonal Branch 1st Diag lesion is 80% stenosed.  Second Diagonal Branch 2nd Diag lesion is 90% stenosed.  Left Circumflex Vessel is moderate in size.  First Obtuse Marginal Branch Vessel is small in size.  Second Obtuse Marginal Branch Vessel is moderate in size. 2nd Mrg lesion is 80% stenosed.  Right Coronary Artery Vessel is large. Prox RCA to Dist RCA lesion is 20% stenosed. The lesion is calcified.  Intervention  No interventions have been documented.     ECHOCARDIOGRAM  ECHOCARDIOGRAM COMPLETE 08/10/2022  Narrative ECHOCARDIOGRAM REPORT    Patient Name:   Warren Wagner. Date of Exam: 08/10/2022 Medical Rec #:  [id number]             Height:       72.0 in Accession #:    [id number]            Weight:       228.0 lb Date of Birth:  [birth year]             BSA:          2.253 m Patient Age:    89 years              BP:           122/67 mmHg Patient Gender: M                     HR:           66 bpm. Exam Location:  [facility]  Procedure: 2D Echo, Cardiac Doppler and Color Doppler  Indications:    I35.0 Nonrheumatic aortic (valve) stenosis  History:  Patient has prior history of Echocardiogram examinations, most recent 02/03/2022. COPD, Arrythmias:Atrial Fibrillation; Risk Factors:Hypertension. Aortic dilatation. Leg swelling. Postural dizziness with presyncope.  Sonographer:    Cathie Beams RCS Referring Phys: Cannon Kettle  IMPRESSIONS   1. Tricuspid aortic valve that is severely calcified. Vmax 2.8 m/s, MG 17 mmHG, EOA 0.91 cm2, DI 0.24. Suspect  paradoxical low flow low gradient severe aortic stenosis due to low SVI (25 cc/m2). Gradients were higher on last study and also consistent with severe aortic stenosis. Would recommend aortic valve calcium score for clarification of aortic stenosis severity. The aortic valve is tricuspid. There is severe calcifcation of the aortic valve. There is severe thickening of the aortic valve. Aortic valve regurgitation is not visualized. Severe aortic valve stenosis. 2. Left ventricular ejection fraction, by estimation, is 65 to 70%. The left ventricle has normal function. The left ventricle has no regional wall motion abnormalities. There is moderate concentric left ventricular hypertrophy. Left ventricular diastolic function could not be evaluated. 3. Right ventricular systolic function is normal. The right ventricular size is normal. Tricuspid regurgitation signal is inadequate for assessing PA pressure. 4. Left atrial size was severely dilated. 5. The mitral valve is degenerative. Mild mitral valve regurgitation. No evidence of mitral stenosis. 6. Aortic dilatation noted. Aneurysm of the aortic root, measuring 44 mm. There is mild dilatation of the ascending aorta, measuring 40 mm. 7. The inferior vena cava is normal in size with greater than 50% respiratory variability, suggesting right atrial pressure of 3 mmHg.  Comparison(s): No significant change from prior study. Severe aortic stenosis is still present.  FINDINGS Left Ventricle: Left ventricular ejection fraction, by estimation, is 65 to 70%. The left ventricle has normal function. The left ventricle has no regional wall motion abnormalities. The left ventricular internal cavity size was normal in size. There is moderate concentric left ventricular hypertrophy. Left ventricular diastolic function could not be evaluated due to atrial fibrillation. Left ventricular diastolic function could not be evaluated.  Right Ventricle: The right ventricular  size is normal. No increase in right ventricular wall thickness. Right ventricular systolic function is normal. Tricuspid regurgitation signal is inadequate for assessing PA pressure.  Left Atrium: Left atrial size was severely dilated.  Right Atrium: Right atrial size was normal in size.  Pericardium: There is no evidence of pericardial effusion.  Mitral Valve: The mitral valve is degenerative in appearance. Mild to moderate mitral annular calcification. Mild mitral valve regurgitation. No evidence of mitral valve stenosis.  Tricuspid Valve: The tricuspid valve is grossly normal. Tricuspid valve regurgitation is trivial. No evidence of tricuspid stenosis.  Aortic Valve: Tricuspid aortic valve that is severely calcified. Vmax 2.8 m/s, MG 17 mmHG, EOA 0.91 cm2, DI 0.24. Suspect paradoxical low flow low gradient severe aortic stenosis due to low SVI (25 cc/m2). Gradients were higher on last study and also consistent with severe aortic stenosis. Would recommend aortic valve calcium score for clarification of aortic stenosis severity. The aortic valve is tricuspid. There is severe calcifcation of the aortic valve. There is severe thickening of the aortic valve. Aortic valve regurgitation is not visualized. Severe aortic stenosis is present. Aortic valve mean gradient measures 17.3 mmHg. Aortic valve peak gradient measures 32.2 mmHg. Aortic valve area, by VTI measures 0.91 cm.  Pulmonic Valve: The pulmonic valve was grossly normal. Pulmonic valve regurgitation is mild. No evidence of pulmonic stenosis.  Aorta: Aortic dilatation noted. There is mild dilatation of the ascending aorta, measuring 40 mm. There is an aneurysm  involving the aortic root measuring 44 mm.  Venous: The inferior vena cava is normal in size with greater than 50% respiratory variability, suggesting right atrial pressure of 3 mmHg.  IAS/Shunts: The atrial septum is grossly normal.   LEFT VENTRICLE PLAX 2D LVIDd:         4.30  cm LVIDs:         2.30 cm LV PW:         1.50 cm LV IVS:        1.60 cm LVOT diam:     2.20 cm LV SV:         56 LV SV Index:   25 LVOT Area:     3.80 cm   RIGHT VENTRICLE RV Basal diam:  3.50 cm RV S prime:     12.17 cm/s TAPSE (M-mode): 2.8 cm  LEFT ATRIUM              Index        RIGHT ATRIUM           Index LA diam:        5.20 cm  2.31 cm/m   RA Area:     17.30 cm LA Vol (A2C):   126.0 ml 55.94 ml/m  RA Volume:   42.80 ml  19.00 ml/m LA Vol (A4C):   139.0 ml 61.71 ml/m LA Biplane Vol: 142.0 ml 63.04 ml/m AORTIC VALVE AV Area (Vmax):    1.08 cm AV Area (Vmean):   0.96 cm AV Area (VTI):     0.91 cm AV Vmax:           283.67 cm/s AV Vmean:          191.000 cm/s AV VTI:            0.616 m AV Peak Grad:      32.2 mmHg AV Mean Grad:      17.3 mmHg LVOT Vmax:         80.67 cm/s LVOT Vmean:        48.400 cm/s LVOT VTI:          0.148 m LVOT/AV VTI ratio: 0.24  AORTA Ao Root diam: 4.40 cm Ao Asc diam:  4.00 cm  MR Peak grad: 127.7 mmHg MR Mean grad: 80.0 mmHg   SHUNTS MR Vmax:      565.00 cm/s Systemic VTI:  0.15 m MR Vmean:     408.7 cm/s  Systemic Diam: 2.20 cm  Lennie Odor MD Electronically signed by Lennie Odor MD Signature Date/Time: 08/10/2022/4:31:27 PM    Final     CT SCANS  CT CORONARY MORPH W/CTA COR W/SCORE 09/16/2022  Addendum 09/16/2022 12:17 PM ADDENDUM REPORT: 09/16/2022 12:15  EXAM: OVER-READ INTERPRETATION  CT CHEST  The following report is an over-read performed by radiologist Dr. Jacob Moores [facility] on 09/16/2022. This over-read does not include interpretation of cardiac or coronary anatomy or pathology. The cardiac TAVR interpretation by the cardiologist is attached.  COMPARISON:  None.  FINDINGS: Extracardiac findings will be described separately under dictation for contemporaneously obtained CTA chest, abdomen and pelvis.  IMPRESSION: Please see separate dictation for contemporaneously  obtained CTA chest, abdomen and pelvis dated 09/16/2022 for full description of relevant extracardiac findings.   Electronically Signed By: Allegra Lai M.D. On: 09/16/2022 12:15  Narrative CLINICAL DATA:  39M with severe aortic stenosis being evaluated for a TAVR procedure.  EXAM: Cardiac TAVR CT  TECHNIQUE: The patient was scanned on a [employer]  Force scanner. A 120 kV retrospective scan was triggered in the descending thoracic aorta at 111 HU's. Gantry rotation speed was 250 msecs and collimation was .6 mm. No beta blockade or nitro were given. The 3D data set was reconstructed in 5% intervals of the R-R cycle. Systolic and diastolic phases were analyzed on a dedicated work station using MPR, MIP and VRT modes. The patient received 100 cc of contrast.  FINDINGS: Aortic Root:  Aortic valve: Trileaflet  Aortic valve calcium score: 4304  Aortic annulus:  Diameter: 71mm x 71mm  Perimeter: 43mm  Area: 563mm^2  Calcifications: Mild calcifications adjacent to left coronary cusp  Coronary height: Min Left - 30mm,  Min Right - 47mm  Sinotubular height: Left cusp - 91mm; Right cusp - 25mm; Noncoronary cusp - 7mm  LVOT (as measured 3 mm below the annulus):  Diameter: 86mm x 51mm  Area: 519mm^2  Calcifications: Mild calcifications inferior to left coronary cusp  Aortic sinus width: Left cusp - 78mm; Right cusp - 4mm; Noncoronary cusp - 60mm  Sinotubular junction width: 57mm x 65mm  Optimum Fluoroscopic Angle for Delivery: LAO 11 CRA 14  Cardiac:  Right atrium: Moderate enlargement  Right ventricle: Moderate dilatation  Pulmonary arteries: Dilated main pulmonary artery measuring 4mm  Pulmonary veins: Normal configuration  Left atrium: Severe enlargement  Left ventricle: Mild dilatation  Pericardium: Normal thickness  Coronary arteries: Coronary calcium score 8367  IMPRESSION: 1. Trileaflet aortic valve with severe calcifications (AV  calcium score 4304)  2. Aortic annulus measures 69mm x 25mm in diameter with perimeter 33mm and area 590 mm^2. There are mild annular calcifications adjacent to left coronary cusp and extending into LVOT. Annular measurements are suitable for delivery of 20mm Edwards Sapien 3 valve  3. Sufficient coronary to annulus distance.  4. Optimum Fluoroscopic Angle for Delivery:  LAO 11 CRA 14  5. Coronary calcium score 8367  Electronically Signed: By: Oswaldo Milian M.D. On: 09/16/2022 11:59            Laboratory Data:  Chemistry Recent Labs  Lab 09/26/22 1030  NA 142  K 4.1  CL 108  CO2 23  GLUCOSE 95  BUN 19  CREATININE 1.10  CALCIUM 9.2  GFRNONAA >60  ANIONGAP 11    No results for input(s): "PROT", "ALBUMIN", "AST", "ALT", "ALKPHOS", "BILITOT" in the last 168 hours. Hematology Recent Labs  Lab 09/26/22 1030  WBC 7.0  RBC 5.54  HGB 16.7  HCT 50.7  MCV 91.5  MCH 30.1  MCHC 32.9  RDW 13.2  PLT 161   Cardiac EnzymesNo results for input(s): "TROPONINI" in the last 168 hours. No results for input(s): "TROPIPOC" in the last 168 hours.  BNP Recent Labs  Lab 09/26/22 1708  BNP 90.1    DDimer No results for input(s): "DDIMER" in the last 168 hours.  Radiology/Studies:  DG Chest 2 View  Result Date: 09/26/2022 CLINICAL DATA:  Near syncope.  Weakness. EXAM: CHEST - 2 VIEW COMPARISON:  July 08, 2022 FINDINGS: Stable cardiomegaly. The hila and mediastinum are unremarkable. No pneumothorax. Mild opacity in the left base. No other acute abnormalities. IMPRESSION: Suspected small left effusion with underlying opacity. No other acute abnormalities. Electronically Signed   By: Dorise Bullion III M.D.   On: 09/26/2022 10:59    Assessment and Plan:    Orthostatic hypotension P-LFLGAS Permanent Atrial Fibrillation Mild aortic dilation - discussed options: he is asymptomatic and has been able to walk around the ED without symptoms - discussed the  important of hydration - will cut back to lasix 20 mg PO daily and 20 mg PO PRN SOB LE edema - Discussed with ED, Patient and family who are amenable to this plan - he has structural eval tomorrow and sees TCTS 09/28/22      For questions or updates, please contact [facility] Please consult [email or web address] for contact info under Cardiology/STEMI.   Rudean Haskell, MD [facility] [place], [place] [address] [phone]  6:36 PM

## 2022-09-27 ENCOUNTER — Ambulatory Visit: Payer: Medicare Other

## 2022-09-27 ENCOUNTER — Ambulatory Visit (INDEPENDENT_AMBULATORY_CARE_PROVIDER_SITE_OTHER): Payer: Medicare Other

## 2022-09-27 DIAGNOSIS — Z7901 Long term (current) use of anticoagulants: Secondary | ICD-10-CM | POA: Diagnosis not present

## 2022-09-27 DIAGNOSIS — I4891 Unspecified atrial fibrillation: Secondary | ICD-10-CM

## 2022-09-27 NOTE — Patient Instructions (Signed)
Description   Take and extra 1/2 tablet today and then continue taking 1 tablet daily except 0.5 tablet on Monday, Wednesday and Friday.  Stay consistent with greens (2 times per week)  Recheck INR 4 weeks  Coumadin Clinic [phone]

## 2022-09-28 ENCOUNTER — Encounter: Payer: Self-pay | Admitting: Surgery

## 2022-09-28 ENCOUNTER — Institutional Professional Consult (permissible substitution): Payer: Medicare Other | Admitting: Surgery

## 2022-09-28 VITALS — BP 148/85 | HR 71 | Resp 20 | Ht 72.0 in | Wt 240.0 lb

## 2022-09-28 DIAGNOSIS — I350 Nonrheumatic aortic (valve) stenosis: Secondary | ICD-10-CM

## 2022-09-28 NOTE — Progress Notes (Signed)
Patient ID: Warren Leas., male   DOB: 1935-05-23, 87 y.o.   MRN: [id number]  [facility]       [address]       [place] [address]             [phone]          CARDIOTHORACIC SURGERY CONSULTATION REPORT  PCP is Lorrene Reid, PA-C Referring Provider is Lauree Chandler, MD Primary Cardiologist is Minus Breeding, MD  Reason for consultation: Severe paradoxical low-flow/low gradient aortic stenosis  HPI:  The patient is an 87 year old gentleman with a history of permanent atrial fibrillation on Coumadin, hypertension, COPD, venous insufficiency and aortic stenosis who was referred for consideration of TAVR.  He has been followed with what was felt to be moderate aortic stenosis with an echocardiogram in April 2023 showing a mean gradient of 34.5 mmHg, a peak gradient of 50.3 mmHg, and a valve area of 0.82 cm.  Dimensionless index was 0.22 with a low stroke-volume index of 29.  He now presents with a several month history of progressive exertional shortness of breath and fatigue as well as recurrent episodes of dizziness particularly with standing up.  He has had some lower extremity edema that improved with diuretics.  He denies any chest pressure or pain.  He was in the emergency room on 09/26/2022 feeling poorly with generalized weakness and dizziness and could not walk to the bathroom.  He was seen by cardiology and felt to be orthostatic and his Lasix was decreased to 20 mg daily.  His most recent echocardiogram on 08/10/2022 showed a trileaflet aortic valve with severe calcification and thickening.  The mean gradient was measured at 17 mmHg with a low stroke-volume index of 25.  Aortic valve area by VTI was 0.91 cm with a dimensionless index of 0.24 consistent with severe aortic stenosis.  He is here today with his daughter who is a Marine scientist.  He lives at home with 2 daughters and a grandchild.  He has  become fairly sedentary and uses a rollator when walking around the house due to dizziness.  Past Medical History:  Diagnosis Date   Aortic stenosis    Atrial fibrillation (HCC)    COPD (chronic obstructive pulmonary disease) ([facility])    Hearing loss    Hypertension    Prostate enlargement    Umbilical hernia [id number]    Past Surgical History:  Procedure Laterality Date   CARDIOVERSION  10/10/2006   cataracts Bilateral    HERNIA REPAIR  [id number]   large umbilical hernia   INSERTION OF MESH  09/10/2012   Procedure: INSERTION OF MESH;  Surgeon: Zenovia Jarred, MD;  Location: [facility];  Service: General;  Laterality: N/A;   macular degeneration Bilateral    RIGHT HEART CATH AND CORONARY ANGIOGRAPHY N/A 09/09/2022   Procedure: RIGHT HEART CATH AND CORONARY ANGIOGRAPHY;  Surgeon: Burnell Blanks, MD;  Location: [facility];  Service: Cardiovascular;  Laterality: N/A;   TONSILLECTOMY     "maybe" ([phone])   UMBILICAL HERNIA REPAIR  09/10/2012   Procedure: HERNIA REPAIR UMBILICAL ADULT;  Surgeon: Zenovia Jarred, MD;  Location: [facility];  Service: General;  Laterality: N/A;    Family History  Problem Relation Age of Onset   Heart disease Mother    Stroke Father     Social History   Socioeconomic History   Marital status: Widowed    Spouse name:  Not on file   Number of children: 3   Years of education: Not on file   Highest education level: Not on file  Occupational History   Occupation: Truck Geophysicist/field seismologist  Tobacco Use   Smoking status: Former    Packs/day: 1.50    Years: 10.00    Total pack years: 15.00    Types: Cigarettes    Quit date: 07/27/1982    Years since quitting: 40.2   Smokeless tobacco: Never  Vaping Use   Vaping Use: Never used  Substance and Sexual Activity   Alcohol use: No   Drug use: No   Sexual activity: Not Currently    Birth control/protection: None  Other Topics Concern   Not on file  Social History Narrative   Lives with 2  daughters. Three children.    Social Determinants of Health   Financial Resource Strain: Not on file  Food Insecurity: Not on file  Transportation Needs: Not on file  Physical Activity: Not on file  Stress: Not on file  Social Connections: Not on file  Intimate Partner Violence: Not on file    Prior to Admission medications   Medication Sig Start Date End Date Taking? Authorizing Provider  acetaminophen (TYLENOL) 500 MG tablet Take 1,000 mg by mouth every 6 (six) hours as needed for moderate pain or headache.   Yes [provider]  Calcium Carb-Cholecalciferol (OYSTER SHELL CALCIUM/D3 PO) Take 600 tablets by mouth daily with breakfast.   Yes [provider]  cetirizine (ZYRTEC) 10 MG tablet Take 1 tablet (10 mg total) by mouth daily. 07/27/22  Yes Abonza, Maritza, PA-C  docusate sodium (COLACE) 100 MG capsule Take 100 mg by mouth daily.   Yes [provider]  finasteride (PROSCAR) 5 MG tablet Take 1 tablet (5 mg total) by mouth daily. 07/27/22  Yes Abonza, Maritza, PA-C  Fluticasone-Umeclidin-Vilant (TRELEGY ELLIPTA) 100-62.5-25 MCG/ACT AEPB INHALE 1 PUFF ONCE DAILY 09/14/22  Yes Boscia, Heather E, NP  furosemide (LASIX) 40 MG tablet Take 1 tablet (40 mg total) by mouth daily. Patient taking differently: Take 20 mg by mouth daily. 07/27/22  Yes Minus Breeding, MD  memantine (NAMENDA) 10 MG tablet Take 1 tablet (10 mg total) by mouth 2 (two) times daily. 03/16/22 03/11/23 Yes Alric Ran, MD  metoprolol succinate (TOPROL-XL) 50 MG 24 hr tablet Take 1 tablet (50 mg total) by mouth daily. Take with or immediately following a meal. 07/27/22  Yes Abonza, Maritza, PA-C  Multiple Vitamins-Minerals (MULTIVITAMIN WITH MINERALS) tablet Take 1 tablet by mouth daily.   Yes [provider]  Multiple Vitamins-Minerals (OCUVITE PO) Take 1 tablet by mouth daily.   Yes [provider]  OMEGA-3 FATTY ACIDS PO Take 500 mg by mouth daily.   Yes [provider]  sodium chloride (OCEAN) 0.65 % SOLN nasal spray Place 1 spray into both nostrils daily as needed for congestion.   Yes [provider]  tobramycin-dexamethasone (TOBRADEX) ophthalmic solution Place 1 drop into both eyes QID.   Yes [provider]  warfarin (COUMADIN) 5 MG tablet TAKE 1/2 TO 1 TABLET BY MOUTH ONCE DAILY AS DIRECTED BY ANTICOAGULATION CLINIC. Patient taking differently: Take 2.5-5 mg by mouth See admin instructions. Take 2.5 mg on Monday,Wednesday and Friday all the other days take 5 mg in the morning DAILY AS DIRECTED BY ANTICOAGULATION CLINIC. 07/27/22  Yes Minus Breeding, MD    Current Outpatient Medications  Medication Sig Dispense Refill   acetaminophen (TYLENOL) 500 MG tablet Take 1,000  mg by mouth every 6 (six) hours as needed for moderate pain or headache.     Calcium Carb-Cholecalciferol (OYSTER SHELL CALCIUM/D3 PO) Take 600 tablets by mouth daily with breakfast.     cetirizine (ZYRTEC) 10 MG tablet Take 1 tablet (10 mg total) by mouth daily. 90 tablet 0   docusate sodium (COLACE) 100 MG capsule Take 100 mg by mouth daily.     finasteride (PROSCAR) 5 MG tablet Take 1 tablet (5 mg total) by mouth daily. 90 tablet 0   Fluticasone-Umeclidin-Vilant (TRELEGY ELLIPTA) 100-62.5-25 MCG/ACT AEPB INHALE 1 PUFF ONCE DAILY 60 each 0   furosemide (LASIX) 40 MG tablet Take 1 tablet (40 mg total) by mouth daily. (Patient taking differently: Take 20 mg by mouth daily.) 90 tablet 3   memantine (NAMENDA) 10 MG tablet Take 1 tablet (10 mg total) by mouth 2 (two) times daily. 60 tablet 11   metoprolol succinate (TOPROL-XL) 50 MG 24 hr tablet Take 1 tablet (50 mg total) by mouth daily. Take with or immediately following a meal. 90 tablet 0   Multiple Vitamins-Minerals (MULTIVITAMIN WITH MINERALS) tablet Take 1 tablet by mouth daily.     Multiple Vitamins-Minerals (OCUVITE PO) Take 1 tablet by mouth daily.     OMEGA-3 FATTY ACIDS PO Take 500 mg by mouth daily.      sodium chloride (OCEAN) 0.65 % SOLN nasal spray Place 1 spray into both nostrils daily as needed for congestion.     tobramycin-dexamethasone (TOBRADEX) ophthalmic solution Place 1 drop into both eyes QID.     warfarin (COUMADIN) 5 MG tablet TAKE 1/2 TO 1 TABLET BY MOUTH ONCE DAILY AS DIRECTED BY ANTICOAGULATION CLINIC. (Patient taking differently: Take 2.5-5 mg by mouth See admin instructions. Take 2.5 mg on Monday,Wednesday and Friday all the other days take 5 mg in the morning DAILY AS DIRECTED BY ANTICOAGULATION CLINIC.) 90 tablet 0   No current facility-administered medications for this visit.    No Known Allergies    Review of Systems:   General:  normal appetite, + decreased energy, no weight gain, no weight loss, no fever  Cardiac:  no chest pain with exertion, no chest pain at rest, +SOB with mild exertion, + resting SOB, no PND, + orthopnea, no palpitations, + arrhythmia, + atrial fibrillation, + LE edema, + dizzy spells, no syncope  Respiratory:  + shortness of breath, no home oxygen, no productive cough, + dry cough, no bronchitis, + wheezing, no hemoptysis, no asthma, no pain with inspiration or cough, no sleep apnea, no CPAP at night  GI:   no difficulty swallowing, no reflux, no frequent heartburn, no hiatal hernia, no abdominal pain, + constipation, no diarrhea, no hematochezia, no hematemesis, no melena  GU:   no dysuria,  no frequency, no urinary tract infection, no hematuria, + enlarged prostate, no kidney stones, no kidney disease  Vascular:   pain suggestive of claudication, no pain in feet, no leg cramps, + varicose veins, no DVT, no non-healing foot ulcer, + venous stasis ulcer in left lower leg.  Neuro:   no stroke, no TIA's, no seizures, no headaches, no temporary blindness one eye,  no slurred speech, no peripheral neuropathy, no chronic pain, + instability of gait, + memory/cognitive dysfunction  Musculoskeletal: no arthritis, no joint swelling, no myalgias, +  difficulty walking, + decreased mobility   Skin:   no rash, no itching, no skin infections, no pressure sores or ulcerations  Psych:   no anxiety, no depression, no nervousness, no  unusual recent stress  Eyes:   + blurry vision, + floaters, no recent vision changes, + wears glasses   ENT:   + hearing loss, no loose or painful teeth, no dentures, last saw dentist Sept or Oct 2023  Hematologic:  + easy bruising, no abnormal bleeding, no clotting disorder, no frequent epistaxis  Endocrine:  no diabetes, does not check CBG's at home     Physical Exam:   BP (!) 148/85 (BP Location: Left Arm, Patient Position: Sitting)   Pulse 71   Resp 20   Ht 6' (1.829 m)   Wt 240 lb (108.9 kg)   SpO2 95% Comment: RA  BMI 32.55 kg/m   General:  Frail-appearing elderly gentleman in no distress. Using rolator.  HEENT:  Unremarkable, NCAT, PERLA, EOMI  Neck:   no JVD, no bruits, no adenopathy   Chest:   clear to auscultation, symmetrical breath sounds, no wheezes, no rhonchi   CV:   RRR, 3/6 systolic murmur RSB, no diastolic murmur  Abdomen:  soft, non-tender, no masses   Extremities:  warm, well-perfused, pulses palpable at ankles, mild lower extremity edema, no open wounds  Rectal/GU  Deferred  Neuro:   Grossly non-focal and symmetrical throughout  Skin:   Clean and dry, no rashes, no breakdown  Diagnostic Tests:  ECHOCARDIOGRAM REPORT       Patient Name:   Warren Wagner. Date of Exam: 08/10/2022  Medical Rec #:  [id number]             Height:       72.0 in  Accession #:    [id number]            Weight:       228.0 lb  Date of Birth:  May 21, 1935             BSA:          2.253 m  Patient Age:    59 years              BP:           122/67 mmHg  Patient Gender: M                     HR:           66 bpm.  Exam Location:  [place] [facility]   Procedure: 2D Echo, Cardiac Doppler and Color Doppler   Indications:    I35.0 Nonrheumatic aortic (valve) stenosis    History:        Patient has prior  history of Echocardiogram examinations,  most                 recent 02/03/2022. COPD, Arrythmias:Atrial Fibrillation;  Risk                 Factors:Hypertension. Aortic dilatation. Leg swelling.  Postural                 dizziness with presyncope.    Sonographer:    Diamond Nickel RCS  Referring Phys: Warren Lacy   IMPRESSIONS     1. Tricuspid aortic valve that is severely calcified. Vmax 2.8 m/s, MG 17  mmHG, EOA 0.91 cm2, DI 0.24. Suspect paradoxical low flow low gradient  severe aortic stenosis due to low SVI (25 cc/m2). Gradients were higher on  last study and also consistent  with severe aortic stenosis. Would recommend aortic valve calcium score  for clarification of aortic stenosis severity.  The aortic valve is  tricuspid. There is severe calcifcation of the aortic valve. There is  severe thickening of the aortic valve. Aortic   valve regurgitation is not visualized. Severe aortic valve stenosis.   2. Left ventricular ejection fraction, by estimation, is 65 to 70%. The  left ventricle has normal function. The left ventricle has no regional  wall motion abnormalities. There is moderate concentric left ventricular  hypertrophy. Left ventricular  diastolic function could not be evaluated.   3. Right ventricular systolic function is normal. The right ventricular  size is normal. Tricuspid regurgitation signal is inadequate for assessing  PA pressure.   4. Left atrial size was severely dilated.   5. The mitral valve is degenerative. Mild mitral valve regurgitation. No  evidence of mitral stenosis.   6. Aortic dilatation noted. Aneurysm of the aortic root, measuring 44 mm.  There is mild dilatation of the ascending aorta, measuring 40 mm.   7. The inferior vena cava is normal in size with greater than 50%  respiratory variability, suggesting right atrial pressure of 3 mmHg.   Comparison(s): No significant change from prior study. Severe aortic  stenosis is still  present.   FINDINGS   Left Ventricle: Left ventricular ejection fraction, by estimation, is 65  to 70%. The left ventricle has normal function. The left ventricle has no  regional wall motion abnormalities. The left ventricular internal cavity  size was normal in size. There is   moderate concentric left ventricular hypertrophy. Left ventricular  diastolic function could not be evaluated due to atrial fibrillation. Left  ventricular diastolic function could not be evaluated.   Right Ventricle: The right ventricular size is normal. No increase in  right ventricular wall thickness. Right ventricular systolic function is  normal. Tricuspid regurgitation signal is inadequate for assessing PA  pressure.   Left Atrium: Left atrial size was severely dilated.   Right Atrium: Right atrial size was normal in size.   Pericardium: There is no evidence of pericardial effusion.   Mitral Valve: The mitral valve is degenerative in appearance. Mild to  moderate mitral annular calcification. Mild mitral valve regurgitation. No  evidence of mitral valve stenosis.   Tricuspid Valve: The tricuspid valve is grossly normal. Tricuspid valve  regurgitation is trivial. No evidence of tricuspid stenosis.   Aortic Valve: Tricuspid aortic valve that is severely calcified. Vmax 2.8  m/s, MG 17 mmHG, EOA 0.91 cm2, DI 0.24. Suspect paradoxical low flow low  gradient severe aortic stenosis due to low SVI (25 cc/m2). Gradients were  higher on last study and also  consistent with severe aortic stenosis. Would recommend aortic valve  calcium score for clarification of aortic stenosis severity. The aortic  valve is tricuspid. There is severe calcifcation of the aortic valve.  There is severe thickening of the aortic  valve. Aortic valve regurgitation is not visualized. Severe aortic  stenosis is present. Aortic valve mean gradient measures 17.3 mmHg. Aortic  valve peak gradient measures 32.2 mmHg. Aortic valve  area, by VTI measures  0.91 cm.   Pulmonic Valve: The pulmonic valve was grossly normal. Pulmonic valve  regurgitation is mild. No evidence of pulmonic stenosis.   Aorta: Aortic dilatation noted. There is mild dilatation of the ascending  aorta, measuring 40 mm. There is an aneurysm involving the aortic root  measuring 44 mm.   Venous: The inferior vena cava is normal in size with greater than 50%  respiratory variability, suggesting right atrial pressure of 3 mmHg.  IAS/Shunts: The atrial septum is grossly normal.     LEFT VENTRICLE  PLAX 2D  LVIDd:         4.30 cm  LVIDs:         2.30 cm  LV PW:         1.50 cm  LV IVS:        1.60 cm  LVOT diam:     2.20 cm  LV SV:         56  LV SV Index:   25  LVOT Area:     3.80 cm     RIGHT VENTRICLE  RV Basal diam:  3.50 cm  RV S prime:     12.17 cm/s  TAPSE (M-mode): 2.8 cm   LEFT ATRIUM              Index        RIGHT ATRIUM           Index  LA diam:        5.20 cm  2.31 cm/m   RA Area:     17.30 cm  LA Vol (A2C):   126.0 ml 55.94 ml/m  RA Volume:   42.80 ml  19.00 ml/m  LA Vol (A4C):   139.0 ml 61.71 ml/m  LA Biplane Vol: 142.0 ml 63.04 ml/m   AORTIC VALVE  AV Area (Vmax):    1.08 cm  AV Area (Vmean):   0.96 cm  AV Area (VTI):     0.91 cm  AV Vmax:           283.67 cm/s  AV Vmean:          191.000 cm/s  AV VTI:            0.616 m  AV Peak Grad:      32.2 mmHg  AV Mean Grad:      17.3 mmHg  LVOT Vmax:         80.67 cm/s  LVOT Vmean:        48.400 cm/s  LVOT VTI:          0.148 m  LVOT/AV VTI ratio: 0.24    AORTA  Ao Root diam: 4.40 cm  Ao Asc diam:  4.00 cm   MR Peak grad: 127.7 mmHg  MR Mean grad: 80.0 mmHg   SHUNTS  MR Vmax:      565.00 cm/s Systemic VTI:  0.15 m  MR Vmean:     408.7 cm/s  Systemic Diam: 2.20 cm   Lennie Odor MD  Electronically signed by Lennie Odor MD  Signature Date/Time: 08/10/2022/4:31:27 PM        Final      Physicians  Panel Physicians Referring Physician Case  Authorizing Physician  Kathleene Hazel, MD (Primary)     Procedures  RIGHT HEART CATH AND CORONARY ANGIOGRAPHY   Conclusion      Prox RCA to Dist RCA lesion is 20% stenosed.   2nd Mrg lesion is 80% stenosed.   Ost LAD to Mid LAD lesion is 40% stenosed.   Mid LAD to Dist LAD lesion is 100% stenosed.   1st Diag lesion is 80% stenosed.   2nd Diag lesion is 90% stenosed.   CTO of the mid LAD. Severe disease in the moderate caliber diagonal branch Moderate caliber Circumflex with severe stenosis in the moderate caliber obtuse marginal branch Large dominant RCA. I could not engage this vessel selectively. The proximal, mid and distal vessel  is patent with mild to moderate calcific disease. I could not visualize the distal branches or potential collaterals from the RCA to the distal LAD Normal right and left heart pressures   Recommendations: medical management of CAD. Continue workup for TAVR.      Indications  Severe aortic stenosis [I35.0 (ICD-10-CM)]   Procedural Details  Technical Details Indication: Severe AS, workup for TAVR  Procedure: The risks, benefits, complications, treatment options, and expected outcomes were discussed with the patient. The patient and/or family concurred with the proposed plan, giving informed consent. The patient was sedated with Versed and Fentanyl. The IV catheter in the right antecubital vein was changed for a 7 [place] sheath. Right heart catheterization performed with a balloon tipped catheter. The right wrist was prepped and draped in a sterile fashion. 1% lidocaine was used for local anesthesia. Using the modified Seldinger access technique, a 5 French sheath was placed in the right radial artery using u/s guidance. 3 mg Verapamil was given through the sheath. Weight based IV heparin was given. Standard diagnostic catheters were used to perform selective coronary angiography. It was difficult to engage the RCA selectively due to tortuosity in  the innominate artery. We did not have Destination sheaths available for use due to national backorder. I did not cross the aortic valve. All catheter exchanges were performed over an exchange length guidewire.   The sheath was removed from the right radial artery and a hemostasis band was applied at the arteriotomy site on the right wrist.      Estimated blood loss <50 mL.   During this procedure medications were administered to achieve and maintain moderate conscious sedation while the patient's heart rate, blood pressure, and oxygen saturation were continuously monitored and I was present face-to-face 100% of this time.   Medications (Filter: Administrations occurring from 1032 to 1150 on 09/09/22) Heparin (Porcine) in NaCl 1000-0.9 UT/500ML-% SOLN (mL)  Total volume: 1,000 mL Date/Time Rate/Dose/Volume Action   09/09/22 1044 500 mL Given   1044 500 mL Given   lidocaine (PF) (XYLOCAINE) 1 % injection (mL)  Total volume: 4 mL Date/Time Rate/Dose/Volume Action   09/09/22 1056 2 mL Given   1057 2 mL Given   midazolam (VERSED) injection (mg)  Total dose: 1 mg Date/Time Rate/Dose/Volume Action   09/09/22 1056 1 mg Given   fentaNYL (SUBLIMAZE) injection (mcg)  Total dose: 25 mcg Date/Time Rate/Dose/Volume Action   09/09/22 1056 25 mcg Given   Radial Cocktail/Verapamil only (mL)  Total volume: 10 mL Date/Time Rate/Dose/Volume Action   09/09/22 1059 10 mL Given   heparin sodium (porcine) injection (Units)  Total dose: 5,000 Units Date/Time Rate/Dose/Volume Action   09/09/22 1106 5,000 Units Given   iohexol (OMNIPAQUE) 350 MG/ML injection (mL)  Total volume: 140 mL Date/Time Rate/Dose/Volume Action   09/09/22 1138 140 mL Given    Sedation Time  Sedation Time Physician-1: 36 minutes 59 seconds Contrast  Medication Name Total Dose  iohexol (OMNIPAQUE) 350 MG/ML injection 140 mL   Radiation/Fluoro  Fluoro time: 16.9 (min) DAP: [id number] (mGycm2) Cumulative Air Kerma: [id number]  (mGy) Complications  Complications documented before study signed (09/09/2022 [id number] PM)   No complications were associated with this study.  Documented by Leslee Home, RN - 09/09/2022 11:41 AM     Coronary Findings  Diagnostic Dominance: Right Left Anterior Descending  Vessel is large.  Ost LAD to Mid LAD lesion is 40% stenosed. The lesion is calcified.  Mid LAD to Dist LAD lesion is 100%  stenosed. The lesion is chronically occluded.    First Diagonal Branch  1st Diag lesion is 80% stenosed.    Second Diagonal Branch  2nd Diag lesion is 90% stenosed.    Left Circumflex  Vessel is moderate in size.    First Obtuse Marginal Branch  Vessel is small in size.    Second Obtuse Marginal Branch  Vessel is moderate in size.  2nd Mrg lesion is 80% stenosed.    Right Coronary Artery  Vessel is large.  Prox RCA to Dist RCA lesion is 20% stenosed. The lesion is calcified.    Intervention   No interventions have been documented.   Coronary Diagrams  Diagnostic Dominance: Right  Intervention   Implants   No implant documentation for this case.   Syngo Images   Show images for CARDIAC CATHETERIZATION Images on Long Term Storage   Show images for Taveion, Baade to Procedure Log  Procedure Log    Hemo Data  Flowsheet Row Most Recent Value  Fick Cardiac Output 5.2 L/min  Fick Cardiac Output Index 2.32 (L/min)/BSA  RA A Wave 6 mmHg  RA V Wave 8 mmHg  RA Mean 8 mmHg  RV Systolic Pressure 33 mmHg  RV Diastolic Pressure 17 mmHg  RV EDP 5 mmHg  PA Systolic Pressure 34 mmHg  PA Diastolic Pressure 15 mmHg  PA Mean 24 mmHg  PW A Wave 14 mmHg  PW V Wave 15 mmHg  PW Mean 12 mmHg  QP/QS 1  TPVR Index 10.34 HRUI   ADDENDUM REPORT: 09/16/2022 12:15   EXAM: OVER-READ INTERPRETATION  CT CHEST   The following report is an over-read performed by radiologist Dr. Maudry Diego [facility] on 09/16/2022. This over-read does not  include interpretation of cardiac or coronary anatomy or pathology. The cardiac TAVR interpretation by the cardiologist is attached.   COMPARISON:  None.   FINDINGS: Extracardiac findings will be described separately under dictation for contemporaneously obtained CTA chest, abdomen and pelvis.   IMPRESSION: Please see separate dictation for contemporaneously obtained CTA chest, abdomen and pelvis dated 09/16/2022 for full description of relevant extracardiac findings.     Electronically Signed   By: Yetta Glassman M.D.   On: 09/16/2022 12:15    Addended by Dena Billet, MD on 09/16/2022 12:17 PM    Study Result  Narrative & Impression  CLINICAL DATA:  23M with severe aortic stenosis being evaluated for a TAVR procedure.   EXAM: Cardiac TAVR CT   TECHNIQUE: The patient was scanned on a [employer]. A 120 kV retrospective scan was triggered in the descending thoracic aorta at 111 HU's. Gantry rotation speed was 250 msecs and collimation was .6 mm. No beta blockade or nitro were given. The 3D data set was reconstructed in 5% intervals of the R-R cycle. Systolic and diastolic phases were analyzed on a dedicated work station using MPR, MIP and VRT modes. The patient received 100 cc of contrast.   FINDINGS: Aortic Root:   Aortic valve: Trileaflet   Aortic valve calcium score: 4304   Aortic annulus:   Diameter: 69mm x 36mm   Perimeter: 4mm   Area: 522mm^2   Calcifications: Mild calcifications adjacent to left coronary cusp   Coronary height: Min Left - 79mm,  Min Right - 69mm   Sinotubular height: Left cusp - 64mm; Right cusp - 42mm; Noncoronary cusp - 27mm   LVOT (as measured 3 mm below the annulus):   Diameter: 50mm x  25mm   Area: 571mm^2   Calcifications: Mild calcifications inferior to left coronary cusp   Aortic sinus width: Left cusp - 73mm; Right cusp - 65mm; Noncoronary cusp - 46mm   Sinotubular junction width: 15mm x 53mm    Optimum Fluoroscopic Angle for Delivery: LAO 11 CRA 14   Cardiac:   Right atrium: Moderate enlargement   Right ventricle: Moderate dilatation   Pulmonary arteries: Dilated main pulmonary artery measuring 26mm   Pulmonary veins: Normal configuration   Left atrium: Severe enlargement   Left ventricle: Mild dilatation   Pericardium: Normal thickness   Coronary arteries: Coronary calcium score 8367   IMPRESSION: 1. Trileaflet aortic valve with severe calcifications (AV calcium score 4304)   2. Aortic annulus measures 36mm x 48mm in diameter with perimeter 62mm and area 590 mm^2. There are mild annular calcifications adjacent to left coronary cusp and extending into LVOT. Annular measurements are suitable for delivery of 83mm Edwards Sapien 3 valve   3. Sufficient coronary to annulus distance.   4. Optimum Fluoroscopic Angle for Delivery:  LAO 11 CRA 14   5. Coronary calcium score 8367   Electronically Signed: By: Oswaldo Milian M.D. On: 09/16/2022 11:59       Narrative & Impression  CLINICAL DATA:  Preop evaluation for aortic valve replacement   EXAM: CT ANGIOGRAPHY CHEST, ABDOMEN AND PELVIS   TECHNIQUE: Multidetector CT imaging through the chest, abdomen and pelvis was performed using the standard protocol during bolus administration of intravenous contrast. Multiplanar reconstructed images and MIPs were obtained and reviewed to evaluate the vascular anatomy.   RADIATION DOSE REDUCTION: This exam was performed according to the departmental dose-optimization program which includes automated exposure control, adjustment of the mA and/or kV according to patient size and/or use of iterative reconstruction technique.   CONTRAST:  127mL OMNIPAQUE IOHEXOL 350 MG/ML SOLN   COMPARISON:  Chest CT dated Feb 18, 2022   FINDINGS: CTA CHEST FINDINGS   Cardiovascular: Cardiomegaly. No pericardial effusion. Dilated ascending thoracic aorta, measuring to 4.3  cm, unchanged when compared with the prior exam. Moderate atherosclerotic disease of the thoracic aorta. Normal variant 2 vessel aortic arch with no significant stenosis. Aortic valve thickening and calcifications. Left main and three-vessel coronary artery calcifications. No suspicious filling defects of the central pulmonary arteries.   Mediastinum/Nodes: Esophagus and thyroid are unremarkable. No pathologically enlarged lymph nodes seen in the chest.   Lungs/Pleura: Central airways are patent. No consolidation, pleural effusion or pneumothorax. Bibasilar atelectasis.   Musculoskeletal: No chest wall abnormality. No acute or significant osseous findings.   CTA ABDOMEN AND PELVIS FINDINGS   Hepatobiliary: Mildly nodular liver contour. No suspicious liver lesions. Gallbladder is unremarkable. No biliary ductal dilation.   Pancreas: Unremarkable. No pancreatic ductal dilatation or surrounding inflammatory changes.   Spleen: Normal in size without focal abnormality.   Adrenals/Urinary Tract: Bilateral adrenal glands are unremarkable. No hydronephrosis. Nonobstructing left renal stone. Exophytic simple appearing cyst of the upper pole of the right kidney, no specific follow-up imaging is recommended. Wall thickening of the urinary bladder with numerous trabeculations.   Stomach/Bowel: Stomach is within normal limits. Small duodenal diverticulum. Appendix appears normal. No evidence of bowel wall thickening, distention, or inflammatory changes.   Vascular/lymphatic: Normal caliber thoracic aorta with moderate atherosclerotic disease. Mild-to-moderate narrowing at the origins of the branch vessels. No pathologically enlarged lymph nodes seen in the abdomen or pelvis.   Reproductive: Prostate is unremarkable.   Other: No abdominal wall hernia or abnormality. No abdominopelvic  ascites.   Musculoskeletal: No acute or significant osseous findings.   VASCULAR MEASUREMENTS  PERTINENT TO TAVR:   AORTA:   Minimal Aortic Diameter-14.6 mm   Severity of Aortic Calcification-moderate   RIGHT PELVIS:   Right Common Iliac Artery -   Minimal Diameter-9.0 mm   Tortuosity-mild   Calcification-mild   Right External Iliac Artery -   Minimal Diameter-7.9 mm   Tortuosity-mild   Calcification-none   Right Common Femoral Artery -   Minimal Diameter-6.0 mm   Tortuosity-none   Calcification-moderate   LEFT PELVIS:   Left Common Iliac Artery -   Minimal Diameter-8.7 mm   Tortuosity-moderate   Calcification-mild   Left External Iliac Artery -   Minimal Diameter-8.2 mm   Tortuosity-mild   Calcification-none   Left Common Femoral Artery -   Minimal Diameter-8.6 mm   Tortuosity-none   Calcification-mild   Review of the MIP images confirms the above findings.   IMPRESSION: 1. Vascular findings and measurements pertinent to potential TAVR procedure, as detailed above. 2. Thickening and calcification of the aortic valve, compatible with reported clinical history of aortic stenosis. 3. Moderate aortoiliac atherosclerosis. Left main and 3 vessel coronary artery disease. 4. Stable mild dilation of the ascending thoracic aorta, measuring up to 4.3 cm. Recommend annual imaging followup by CTA or MRA. This recommendation follows 2010 ACCF/AHA/AATS/ACR/ASA/SCA/SCAI/SIR/STS/SVM Guidelines for the Diagnosis and Management of Patients with Thoracic Aortic Disease. Circulation. 2010; [id number]. Aortic aneurysm NOS (ICD10-I71.9) 5. Mildly nodular liver contour, findings is suggestive of cirrhosis. 6. Wall thickening of the urinary bladder with numerous trabeculations, findings can be seen in the setting of chronic bladder outlet obstruction. 7. Nonobstructing left renal stone.     Electronically Signed   By: Yetta Glassman M.D.   On: 09/16/2022 12:16     Impression:  This 87 year old gentleman has stage D, severe, paradoxical,  low-flow/low gradient aortic stenosis with New York Heart Association class III symptoms of exertional fatigue and shortness of breath consistent with chronic diastolic congestive heart failure as well as recurrent episodes of dizziness.  I have personally reviewed his 2D echocardiograms, cardiac catheterization, and CTA studies.  His recent echo shows a trileaflet aortic valve with severe calcification and thickening and restricted leaflet mobility.  The mean gradient is only 17 mmHg which has decreased from his prior echo in April 2023 when it was 34.5 mmHg.  This is most likely due to his reduced stroke-volume index of 25 and moderate concentric LVH.  His dimensionless index is 0.24 with an aortic valve area of 0.91 cm consistent with severe aortic stenosis.  His symptoms certainly are consistent with severe aortic stenosis.  Cardiac catheterization shows three-vessel coronary artery disease with a CTO of the mid LAD and no significant distal flow noted in the LAD.  There is severe stenosis of a moderate size diagonal branch and severe stenosis of a moderate size obtuse marginal branch.  The RCA is a large dominant vessel with mild to moderate disease throughout.  Right and left heart pressures were normal.  The patient has no anginal symptoms and it is felt that medical treatment of his coronary disease would be best.  I agree that aortic valve replacement is indicated in this patient for relief of his progressive symptoms and to prevent left ventricular deterioration.  His gated cardiac CTA shows anatomy suitable for TAVR using a 29 mm SAPIEN 3 valve.  His abdominal and pelvic CTA shows adequate pelvic vascular anatomy to allow transfemoral insertion.  CTA  of the chest shows a small ascending aortic aneurysm measured at 4.3 cm.  The patient and his daughter were counseled at length regarding treatment alternatives for management of severe symptomatic aortic stenosis. The risks and benefits of surgical  intervention has been discussed in detail. Long-term prognosis with medical therapy was discussed. Alternative approaches such as conventional surgical aortic valve replacement, transcatheter aortic valve replacement, and palliative medical therapy were compared and contrasted at length. This discussion was placed in the context of the patient's own specific clinical presentation and past medical history. All of their questions have been addressed.   Following the decision to proceed with transcatheter aortic valve replacement, a discussion was held regarding what types of management strategies would be attempted intraoperatively in the event of life-threatening complications, including whether or not the patient would be considered a candidate for the use of cardiopulmonary bypass and/or conversion to open sternotomy for attempted surgical intervention.  Given his advanced age and frailty I do not think he would be a candidate for emergent sternotomy to manage any intraoperative complications.  The patient is aware of the fact that transient use of cardiopulmonary bypass may be necessary. The patient has been advised of a variety of complications that might develop including but not limited to risks of death, stroke, paravalvular leak, aortic dissection or other major vascular complications, aortic annulus rupture, device embolization, cardiac rupture or perforation, mitral regurgitation, acute myocardial infarction, arrhythmia, heart block or bradycardia requiring permanent pacemaker placement, congestive heart failure, respiratory failure, renal failure, pneumonia, infection, other late complications related to structural valve deterioration or migration, or other complications that might ultimately cause a temporary or permanent loss of functional independence or other long term morbidity. The patient provides full informed consent for the procedure as described and all questions were answered.       Plan:  He will be scheduled for transfemoral TAVR using a SAPIEN 3 valve on 10/18/2022.  He has been on Coumadin for persistent atrial fibrillation and that would need to be stopped 5 days preoperatively.  I spent 60 minutes performing this consultation and > 50% of this time was spent face to face counseling and coordinating the care of this patient's severe paradoxical low-flow/low gradient aortic stenosis.   Gaye Pollack, MD 09/28/2022 3:37 PM

## 2022-09-30 ENCOUNTER — Other Ambulatory Visit: Payer: Self-pay

## 2022-09-30 ENCOUNTER — Other Ambulatory Visit: Payer: Self-pay | Admitting: Physician Assistant

## 2022-09-30 DIAGNOSIS — I350 Nonrheumatic aortic (valve) stenosis: Secondary | ICD-10-CM

## 2022-10-12 ENCOUNTER — Other Ambulatory Visit: Payer: Self-pay | Admitting: Nurse Practitioner

## 2022-10-12 DIAGNOSIS — J449 Chronic obstructive pulmonary disease, unspecified: Secondary | ICD-10-CM

## 2022-10-14 ENCOUNTER — Encounter (HOSPITAL_COMMUNITY)
Admission: RE | Admit: 2022-10-14 | Discharge: 2022-10-14 | Disposition: A | Discharge status: Home / Self Care | Payer: Medicare Other | Source: Ambulatory Visit | Attending: Cardiovascular Disease | Admitting: Cardiovascular Disease

## 2022-10-14 ENCOUNTER — Ambulatory Visit (HOSPITAL_COMMUNITY)
Admission: RE | Admit: 2022-10-14 | Discharge: 2022-10-14 | Disposition: A | Discharge status: Home / Self Care | Payer: Medicare Other | Source: Ambulatory Visit | Attending: Cardiovascular Disease | Admitting: Cardiovascular Disease

## 2022-10-14 ENCOUNTER — Other Ambulatory Visit: Payer: Self-pay

## 2022-10-14 DIAGNOSIS — I350 Nonrheumatic aortic (valve) stenosis: Secondary | ICD-10-CM | POA: Diagnosis not present

## 2022-10-14 DIAGNOSIS — Z01818 Encounter for other preprocedural examination: Secondary | ICD-10-CM

## 2022-10-14 DIAGNOSIS — Z01812 Encounter for preprocedural laboratory examination: Secondary | ICD-10-CM | POA: Diagnosis not present

## 2022-10-14 DIAGNOSIS — Z1152 Encounter for screening for COVID-19: Secondary | ICD-10-CM | POA: Insufficient documentation

## 2022-10-14 LAB — PROTIME-INR
INR: 1.6 — ABNORMAL HIGH (ref 0.8–1.2)
Prothrombin Time: 19.2 seconds — ABNORMAL HIGH (ref 11.4–15.2)

## 2022-10-14 LAB — COMPREHENSIVE METABOLIC PANEL
ALT: 27 U/L (ref 0–44)
AST: 47 U/L — ABNORMAL HIGH (ref 15–41)
Albumin: 4.1 g/dL (ref 3.5–5.0)
Alkaline Phosphatase: 75 U/L (ref 38–126)
Anion gap: 20 — ABNORMAL HIGH (ref 5–15)
BUN: 20 mg/dL (ref 8–23)
CO2: 19 mmol/L — ABNORMAL LOW (ref 22–32)
Calcium: 9.6 mg/dL (ref 8.9–10.3)
Chloride: 108 mmol/L (ref 98–111)
Creatinine, Ser: 1.34 mg/dL — ABNORMAL HIGH (ref 0.61–1.24)
GFR, Estimated: 52 mL/min — ABNORMAL LOW (ref >60)
Glucose, Bld: 78 mg/dL (ref 70–99)
Potassium: 4.5 mmol/L (ref 3.5–5.1)
Sodium: 147 mmol/L — ABNORMAL HIGH (ref 135–145)
Total Bilirubin: 1.6 mg/dL — ABNORMAL HIGH (ref 0.3–1.2)
Total Protein: 7.6 g/dL (ref 6.5–8.1)

## 2022-10-14 LAB — CBC
HCT: 53.2 % — ABNORMAL HIGH (ref 39.0–52.0)
Hemoglobin: 16.7 g/dL (ref 13.0–17.0)
MCH: 29.2 pg (ref 26.0–34.0)
MCHC: 31.4 g/dL (ref 30.0–36.0)
MCV: 93 fL (ref 80.0–100.0)
Platelets: 150 10*3/uL (ref 150–400)
RBC: 5.72 MIL/uL (ref 4.22–5.81)
RDW: 13.2 % (ref 11.5–15.5)
WBC: 8.6 10*3/uL (ref 4.0–10.5)
nRBC: 0 % (ref 0.0–0.2)

## 2022-10-14 LAB — URINALYSIS, ROUTINE W REFLEX MICROSCOPIC
Bilirubin Urine: NEGATIVE
Glucose, UA: NEGATIVE mg/dL
Hgb urine dipstick: NEGATIVE
Ketones, ur: NEGATIVE mg/dL
Leukocytes,Ua: NEGATIVE
Nitrite: NEGATIVE
Protein, ur: NEGATIVE mg/dL
Specific Gravity, Urine: 1.008 (ref 1.005–1.030)
pH: 5 (ref 5.0–8.0)

## 2022-10-14 LAB — SURGICAL PCR SCREEN
MRSA, PCR: POSITIVE — AB
Staphylococcus aureus: POSITIVE — AB

## 2022-10-14 LAB — TYPE AND SCREEN
ABO/RH(D): A POS
Antibody Screen: NEGATIVE

## 2022-10-14 NOTE — Progress Notes (Signed)
Letter with instructions given to patient as well as CHG soap. CHG soap instructions reviewed and patient questions answered.   

## 2022-10-14 NOTE — Progress Notes (Signed)
TAVR RN aware of resulted labs--repeat PT/INR DOS. Chemistry lab down, CMET still pending

## 2022-10-15 LAB — SARS CORONAVIRUS 2 (TAT 6-24 HRS): SARS Coronavirus 2: NEGATIVE

## 2022-10-17 MED ORDER — MAGNESIUM SULFATE 50 % IJ SOLN
40.0000 meq | INTRAMUSCULAR | Status: DC
  Filled 2022-10-17 (×2): qty 9.85

## 2022-10-17 MED ORDER — VANCOMYCIN HCL 1500 MG/300ML IV SOLN
1500.0000 mg | INTRAVENOUS | Status: AC
  Administered 2022-10-18: 1000 mg via INTRAVENOUS
  Filled 2022-10-17 (×2): qty 300

## 2022-10-17 MED ORDER — DEXMEDETOMIDINE HCL IN NACL 400 MCG/100ML IV SOLN
0.1000 ug/kg/h | INTRAVENOUS | Status: AC
  Administered 2022-10-18: 51.5 ug via INTRAVENOUS
  Filled 2022-10-17: qty 100

## 2022-10-17 MED ORDER — CEFAZOLIN SODIUM-DEXTROSE 2-4 GM/100ML-% IV SOLN
2.0000 g | INTRAVENOUS | Status: AC
  Administered 2022-10-18: 2 g via INTRAVENOUS
  Filled 2022-10-17: qty 100

## 2022-10-17 MED ORDER — NOREPINEPHRINE 4 MG/250ML-% IV SOLN
0.0000 ug/min | INTRAVENOUS | Status: AC
  Administered 2022-10-18: 2 ug/min via INTRAVENOUS
  Filled 2022-10-17: qty 250

## 2022-10-17 MED ORDER — HEPARIN 30,000 UNITS/1000 ML (OHS) CELLSAVER SOLUTION
Status: DC
  Filled 2022-10-17 (×2): qty 1000

## 2022-10-17 MED ORDER — POTASSIUM CHLORIDE 2 MEQ/ML IV SOLN
80.0000 meq | INTRAVENOUS | Status: DC
  Filled 2022-10-17 (×2): qty 40

## 2022-10-17 NOTE — Anesthesia Preprocedure Evaluation (Signed)
Anesthesia Evaluation  Patient identified by MRN, date of birth, ID band Patient awake    Reviewed: Allergy & Precautions, NPO status , Patient's Chart, lab work & pertinent test results, reviewed documented beta blocker date and time   History of Anesthesia Complications Negative for: history of anesthetic complications  Airway Mallampati: II  TM Distance: >3 FB Neck ROM: Full    Dental  (+) Dental Advisory Given, Missing   Pulmonary COPD,  COPD inhaler, former smoker   breath sounds clear to auscultation       Cardiovascular hypertension, Pt. on medications and Pt. on home beta blockers (-) angina + CAD (100% LAD occlusion)  + dysrhythmias Atrial Fibrillation + Valvular Problems/Murmurs (syncope) AS  Rhythm:Irregular Rate:Normal + Systolic murmurs 10/7492 ECHO: EF 65-70%, normal LVF, mod LVH, normal RVF, severe AS, mild MR   Neuro/Psych       Dementia negative neurological ROS     GI/Hepatic negative GI ROS, Neg liver ROS,,,  Endo/Other  BMI 31  Renal/GU Renal InsufficiencyRenal disease     Musculoskeletal   Abdominal  (+) + obese  Peds  Hematology coumadin   Anesthesia Other Findings   Reproductive/Obstetrics                             Anesthesia Physical Anesthesia Plan  ASA: 4  Anesthesia Plan: MAC   Post-op Pain Management: Tylenol PO (pre-op)*   Induction:   PONV Risk Score and Plan: 1 and Ondansetron and Treatment may vary due to age or medical condition  Airway Management Planned: Natural Airway and Simple Face Mask  Additional Equipment: Arterial line  Intra-op Plan:   Post-operative Plan:   Informed Consent: I have reviewed the patients History and Physical, chart, labs and discussed the procedure including the risks, benefits and alternatives for the proposed anesthesia with the patient or authorized representative who has indicated his/her understanding and  acceptance.     Dental advisory given and Consent reviewed with POA  Plan Discussed with: CRNA and Surgeon  Anesthesia Plan Comments: (Discussed with pt and pt's daughter)        Anesthesia Quick Evaluation

## 2022-10-17 NOTE — Progress Notes (Signed)
Patient was called to informed that the surgery time for tomorrow was changed to 07:45 o'clock. Patient wasn't available and this writer left a message. Patient was instructed to be at the hospital at 05:45 o'clock. Patient was encouraged to call [phone] if he will have any questions.

## 2022-10-17 NOTE — H&P (Signed)
[address]       [place] [address]             [phone]      Cardiothoracic Surgery Admission History and Physical   PCP is Mayer Masker, PA-C Referring Provider is Verne Carrow, MD Primary Cardiologist is [name], MD   Reason for admission: Severe paradoxical low-flow/low gradient aortic stenosis   HPI:   The patient is an 88 year old gentleman with a history of permanent atrial fibrillation on Coumadin, hypertension, COPD, venous insufficiency and aortic stenosis who was referred for consideration of TAVR.  He has been followed with what was felt to be moderate aortic stenosis with an echocardiogram in April 2023 showing a mean gradient of 34.5 mmHg, a peak gradient of 50.3 mmHg, and a valve area of 0.82 cm.  Dimensionless index was 0.22 with a low stroke-volume index of 29.  He now presents with a several month history of progressive exertional shortness of breath and fatigue as well as recurrent episodes of dizziness particularly with standing up.  He has had some lower extremity edema that improved with diuretics.  He denies any chest pressure or pain.  He was in the emergency room on 09/26/2022 feeling poorly with generalized weakness and dizziness and could not walk to the bathroom.  He was seen by cardiology and felt to be orthostatic and his Lasix was decreased to 20 mg daily.  His most recent echocardiogram on 08/10/2022 showed a trileaflet aortic valve with severe calcification and thickening.  The mean gradient was measured at 17 mmHg with a low stroke-volume index of 25.  Aortic valve area by VTI was 0.91 cm with a dimensionless index of 0.24 consistent with severe aortic stenosis.   He has a daughter who is a Engineer, civil (consulting).  He lives at home with 2 daughters and a grandchild.  He has become fairly sedentary and uses a rollator when walking around the house due to dizziness.       Past Medical History:  Diagnosis Date   Aortic stenosis      Atrial fibrillation (HCC)     COPD (chronic obstructive pulmonary disease) (HCC)     Hearing loss     Hypertension     Prostate enlargement     Umbilical hernia 07/27/2012           Past Surgical History:  Procedure Laterality Date   CARDIOVERSION   10/10/2006   cataracts Bilateral     HERNIA REPAIR   09/10/2012    large umbilical hernia   INSERTION OF MESH   09/10/2012    Procedure: INSERTION OF MESH;  Surgeon: [name], MD;  Location: [facility] OR;  Service: General;  Laterality: N/A;   macular degeneration Bilateral     RIGHT HEART CATH AND CORONARY ANGIOGRAPHY N/A 09/09/2022    Procedure: RIGHT HEART CATH AND CORONARY ANGIOGRAPHY;  Surgeon: Kathleene Hazel, MD;  Location: [facility] INVASIVE CV [facility];  Service: Cardiovascular;  Laterality: N/A;   TONSILLECTOMY        "maybe" (09/10/2012)   UMBILICAL HERNIA REPAIR   09/10/2012    Procedure: HERNIA REPAIR UMBILICAL ADULT;  Surgeon: [name], MD;  Location: [facility] OR;  Service: General;  Laterality: N/A;           Family History  Problem Relation Age of Onset   Heart disease Mother     Stroke Father        Social History  Socioeconomic History   Marital status: Widowed      Spouse name: Not on file   Number of children: 3   Years of education: Not on file   Highest education level: Not on file  Occupational History   Occupation: Truck Geophysicist/field seismologist  Tobacco Use   Smoking status: Former      Packs/day: 1.50      Years: 10.00      Total pack years: 15.00      Types: Cigarettes      Quit date: 07/27/1982      Years since quitting: 40.2   Smokeless tobacco: Never  Vaping Use   Vaping Use: Never used  Substance and Sexual Activity   Alcohol use: No   Drug use: No   Sexual activity: Not Currently      Birth control/protection: None  Other Topics Concern   Not on file  Social History Narrative    Lives with 2 daughters. Three children.     Social Determinants of Health    Financial Resource Strain: Not  on file  Food Insecurity: Not on file  Transportation Needs: Not on file  Physical Activity: Not on file  Stress: Not on file  Social Connections: Not on file  Intimate Partner Violence: Not on file             Prior to Admission medications   Medication Sig Start Date End Date Taking? Authorizing Provider  acetaminophen (TYLENOL) 500 MG tablet Take 1,000 mg by mouth every 6 (six) hours as needed for moderate pain or headache.     Yes [provider]  Calcium Carb-Cholecalciferol (OYSTER SHELL CALCIUM/D3 PO) Take 600 tablets by mouth daily with breakfast.     Yes [provider]  cetirizine (ZYRTEC) 10 MG tablet Take 1 tablet (10 mg total) by mouth daily. 07/27/22   Yes Abonza, Maritza, PA-C  docusate sodium (COLACE) 100 MG capsule Take 100 mg by mouth daily.     Yes [provider]  finasteride (PROSCAR) 5 MG tablet Take 1 tablet (5 mg total) by mouth daily. 07/27/22   Yes Abonza, Maritza, PA-C  Fluticasone-Umeclidin-Vilant (TRELEGY ELLIPTA) 100-62.5-25 MCG/ACT AEPB INHALE 1 PUFF ONCE DAILY 09/14/22   Yes Boscia, Heather E, NP  furosemide (LASIX) 40 MG tablet Take 1 tablet (40 mg total) by mouth daily. Patient taking differently: Take 20 mg by mouth daily. 07/27/22   Yes Minus Breeding, MD  memantine (NAMENDA) 10 MG tablet Take 1 tablet (10 mg total) by mouth 2 (two) times daily. 03/16/22 03/11/23 Yes Alric Ran, MD  metoprolol succinate (TOPROL-XL) 50 MG 24 hr tablet Take 1 tablet (50 mg total) by mouth daily. Take with or immediately following a meal. 07/27/22   Yes Abonza, Maritza, PA-C  Multiple Vitamins-Minerals (MULTIVITAMIN WITH MINERALS) tablet Take 1 tablet by mouth daily.     Yes [provider]  Multiple Vitamins-Minerals (OCUVITE PO) Take 1 tablet by mouth daily.     Yes [provider]  OMEGA-3 FATTY ACIDS PO Take 500 mg by mouth daily.     Yes [provider]  sodium chloride (OCEAN) 0.65 % SOLN nasal spray Place 1 spray  into both nostrils daily as needed for congestion.     Yes [provider]  tobramycin-dexamethasone (TOBRADEX) ophthalmic solution Place 1 drop into both eyes QID.     Yes [provider]  warfarin (COUMADIN) 5 MG tablet TAKE 1/2 TO 1 TABLET BY MOUTH ONCE DAILY AS DIRECTED BY ANTICOAGULATION  CLINIC. Patient taking differently: Take 2.5-5 mg by mouth See admin instructions. Take 2.5 mg on Monday,Wednesday and Friday all the other days take 5 mg in the morning DAILY AS DIRECTED BY ANTICOAGULATION CLINIC. 07/27/22   Yes [name], MD            Current Outpatient Medications  Medication Sig Dispense Refill   acetaminophen (TYLENOL) 500 MG tablet Take 1,000 mg by mouth every 6 (six) hours as needed for moderate pain or headache.       Calcium Carb-Cholecalciferol (OYSTER SHELL CALCIUM/D3 PO) Take 600 tablets by mouth daily with breakfast.       cetirizine (ZYRTEC) 10 MG tablet Take 1 tablet (10 mg total) by mouth daily. 90 tablet 0   docusate sodium (COLACE) 100 MG capsule Take 100 mg by mouth daily.       finasteride (PROSCAR) 5 MG tablet Take 1 tablet (5 mg total) by mouth daily. 90 tablet 0   Fluticasone-Umeclidin-Vilant (TRELEGY ELLIPTA) 100-62.5-25 MCG/ACT AEPB INHALE 1 PUFF ONCE DAILY 60 each 0   furosemide (LASIX) 40 MG tablet Take 1 tablet (40 mg total) by mouth daily. (Patient taking differently: Take 20 mg by mouth daily.) 90 tablet 3   memantine (NAMENDA) 10 MG tablet Take 1 tablet (10 mg total) by mouth 2 (two) times daily. 60 tablet 11   metoprolol succinate (TOPROL-XL) 50 MG 24 hr tablet Take 1 tablet (50 mg total) by mouth daily. Take with or immediately following a meal. 90 tablet 0   Multiple Vitamins-Minerals (MULTIVITAMIN WITH MINERALS) tablet Take 1 tablet by mouth daily.       Multiple Vitamins-Minerals (OCUVITE PO) Take 1 tablet by mouth daily.       OMEGA-3 FATTY ACIDS PO Take 500 mg by mouth daily.       sodium chloride (OCEAN) 0.65 % SOLN nasal  spray Place 1 spray into both nostrils daily as needed for congestion.       tobramycin-dexamethasone (TOBRADEX) ophthalmic solution Place 1 drop into both eyes QID.       warfarin (COUMADIN) 5 MG tablet TAKE 1/2 TO 1 TABLET BY MOUTH ONCE DAILY AS DIRECTED BY ANTICOAGULATION CLINIC. (Patient taking differently: Take 2.5-5 mg by mouth See admin instructions. Take 2.5 mg on Monday,Wednesday and Friday all the other days take 5 mg in the morning DAILY AS DIRECTED BY ANTICOAGULATION CLINIC.) 90 tablet 0    No current facility-administered medications for this visit.      No Known Allergies       Review of Systems:               General:                      normal appetite, + decreased energy, no weight gain, no weight loss, no fever             Cardiac:                       no chest pain with exertion, no chest pain at rest, +SOB with mild exertion, + resting SOB, no PND, + orthopnea, no palpitations, + arrhythmia, + atrial fibrillation, + LE edema, + dizzy spells, no syncope             Respiratory:                 + shortness of breath, no home oxygen, no productive cough, + dry cough, no  bronchitis, + wheezing, no hemoptysis, no asthma, no pain with inspiration or cough, no sleep apnea, no CPAP at night             GI:                               no difficulty swallowing, no reflux, no frequent heartburn, no hiatal hernia, no abdominal pain, + constipation, no diarrhea, no hematochezia, no hematemesis, no melena             GU:                              no dysuria,  no frequency, no urinary tract infection, no hematuria, + enlarged prostate, no kidney stones, no kidney disease             Vascular:                      pain suggestive of claudication, no pain in feet, no leg cramps, + varicose veins, no DVT, no non-healing foot ulcer, + venous stasis ulcer in left lower leg.             Neuro:                         no stroke, no TIA's, no seizures, no headaches, no temporary blindness one  eye,  no slurred speech, no peripheral neuropathy, no chronic pain, + instability of gait, + memory/cognitive dysfunction             Musculoskeletal:         no arthritis, no joint swelling, no myalgias, + difficulty walking, + decreased mobility              Skin:                            no rash, no itching, no skin infections, no pressure sores or ulcerations             Psych:                         no anxiety, no depression, no nervousness, no unusual recent stress             Eyes:                           + blurry vision, + floaters, no recent vision changes, + wears glasses              ENT:                            + hearing loss, no loose or painful teeth, no dentures, last saw dentist Sept or Oct 2023             Hematologic:               + easy bruising, no abnormal bleeding, no clotting disorder, no frequent epistaxis             Endocrine:                   no diabetes, does not check CBG's at home  Physical Exam:               BP (!) 148/85 (BP Location: Left Arm, Patient Position: Sitting)   Pulse 71   Resp 20   Ht 6' (1.829 m)   Wt 240 lb (108.9 kg)   SpO2 95% Comment: RA  BMI 32.55 kg/m              General:                      Frail-appearing elderly gentleman in no distress. Using rolator.             HEENT:                       Unremarkable, NCAT, PERLA, EOMI             Neck:                           no JVD, no bruits, no adenopathy              Chest:                          clear to auscultation, symmetrical breath sounds, no wheezes, no rhonchi              CV:                              RRR, 3/6 systolic murmur RSB, no diastolic murmur             Abdomen:                    soft, non-tender, no masses              Extremities:                 warm, well-perfused, pulses palpable at ankles, mild lower extremity edema, no open wounds             Rectal/GU                   Deferred             Neuro:                          Grossly non-focal and symmetrical throughout             Skin:                            Clean and dry, no rashes, no breakdown   Diagnostic Tests:   ECHOCARDIOGRAM REPORT       Patient Name:   Melinda Pottinger. Date of Exam: 08/10/2022  Medical Rec #:  [id number]             Height:       72.0 in  Accession #:    [id number]            Weight:       228.0 lb  Date of Birth:  1934/12/02             BSA:          2.253 m  Patient Age:  87 years              BP:           122/67 mmHg  Patient Gender: M                     HR:           66 bpm.  Exam Location:  [address]   Procedure: 2D Echo, Cardiac Doppler and Color Doppler   Indications:    I35.0 Nonrheumatic aortic (valve) stenosis    History:        Patient has prior history of Echocardiogram examinations,  most                 recent 02/03/2022. COPD, Arrythmias:Atrial Fibrillation;  Risk                 Factors:Hypertension. Aortic dilatation. Leg swelling.  Postural                 dizziness with presyncope.    Sonographer:    Cathie BeamsAngela Gregory RCS  Referring Phys: Cannon KettleJENNIFER K LAMBERT   IMPRESSIONS     1. Tricuspid aortic valve that is severely calcified. Vmax 2.8 m/s, MG 17  mmHG, EOA 0.91 cm2, DI 0.24. Suspect paradoxical low flow low gradient  severe aortic stenosis due to low SVI (25 cc/m2). Gradients were higher on  last study and also consistent  with severe aortic stenosis. Would recommend aortic valve calcium score  for clarification of aortic stenosis severity. The aortic valve is  tricuspid. There is severe calcifcation of the aortic valve. There is  severe thickening of the aortic valve. Aortic   valve regurgitation is not visualized. Severe aortic valve stenosis.   2. Left ventricular ejection fraction, by estimation, is 65 to 70%. The  left ventricle has normal function. The left ventricle has no regional  wall motion abnormalities. There is moderate concentric left ventricular  hypertrophy. Left  ventricular  diastolic function could not be evaluated.   3. Right ventricular systolic function is normal. The right ventricular  size is normal. Tricuspid regurgitation signal is inadequate for assessing  PA pressure.   4. Left atrial size was severely dilated.   5. The mitral valve is degenerative. Mild mitral valve regurgitation. No  evidence of mitral stenosis.   6. Aortic dilatation noted. Aneurysm of the aortic root, measuring 44 mm.  There is mild dilatation of the ascending aorta, measuring 40 mm.   7. The inferior vena cava is normal in size with greater than 50%  respiratory variability, suggesting right atrial pressure of 3 mmHg.   Comparison(s): No significant change from prior study. Severe aortic  stenosis is still present.   FINDINGS   Left Ventricle: Left ventricular ejection fraction, by estimation, is 65  to 70%. The left ventricle has normal function. The left ventricle has no  regional wall motion abnormalities. The left ventricular internal cavity  size was normal in size. There is   moderate concentric left ventricular hypertrophy. Left ventricular  diastolic function could not be evaluated due to atrial fibrillation. Left  ventricular diastolic function could not be evaluated.   Right Ventricle: The right ventricular size is normal. No increase in  right ventricular wall thickness. Right ventricular systolic function is  normal. Tricuspid regurgitation signal is inadequate for assessing PA  pressure.   Left Atrium: Left atrial size was severely dilated.   Right Atrium: Right atrial size was normal in size.  Pericardium: There is no evidence of pericardial effusion.   Mitral Valve: The mitral valve is degenerative in appearance. Mild to  moderate mitral annular calcification. Mild mitral valve regurgitation. No  evidence of mitral valve stenosis.   Tricuspid Valve: The tricuspid valve is grossly normal. Tricuspid valve  regurgitation is trivial. No  evidence of tricuspid stenosis.   Aortic Valve: Tricuspid aortic valve that is severely calcified. Vmax 2.8  m/s, MG 17 mmHG, EOA 0.91 cm2, DI 0.24. Suspect paradoxical low flow low  gradient severe aortic stenosis due to low SVI (25 cc/m2). Gradients were  higher on last study and also  consistent with severe aortic stenosis. Would recommend aortic valve  calcium score for clarification of aortic stenosis severity. The aortic  valve is tricuspid. There is severe calcifcation of the aortic valve.  There is severe thickening of the aortic  valve. Aortic valve regurgitation is not visualized. Severe aortic  stenosis is present. Aortic valve mean gradient measures 17.3 mmHg. Aortic  valve peak gradient measures 32.2 mmHg. Aortic valve area, by VTI measures  0.91 cm.   Pulmonic Valve: The pulmonic valve was grossly normal. Pulmonic valve  regurgitation is mild. No evidence of pulmonic stenosis.   Aorta: Aortic dilatation noted. There is mild dilatation of the ascending  aorta, measuring 40 mm. There is an aneurysm involving the aortic root  measuring 44 mm.   Venous: The inferior vena cava is normal in size with greater than 50%  respiratory variability, suggesting right atrial pressure of 3 mmHg.   IAS/Shunts: The atrial septum is grossly normal.     LEFT VENTRICLE  PLAX 2D  LVIDd:         4.30 cm  LVIDs:         2.30 cm  LV PW:         1.50 cm  LV IVS:        1.60 cm  LVOT diam:     2.20 cm  LV SV:         56  LV SV Index:   25  LVOT Area:     3.80 cm     RIGHT VENTRICLE  RV Basal diam:  3.50 cm  RV S prime:     12.17 cm/s  TAPSE (M-mode): 2.8 cm   LEFT ATRIUM              Index        RIGHT ATRIUM           Index  LA diam:        5.20 cm  2.31 cm/m   RA Area:     17.30 cm  LA Vol (A2C):   126.0 ml 55.94 ml/m  RA Volume:   42.80 ml  19.00 ml/m  LA Vol (A4C):   139.0 ml 61.71 ml/m  LA Biplane Vol: 142.0 ml 63.04 ml/m   AORTIC VALVE  AV Area (Vmax):    1.08 cm   AV Area (Vmean):   0.96 cm  AV Area (VTI):     0.91 cm  AV Vmax:           283.67 cm/s  AV Vmean:          191.000 cm/s  AV VTI:            0.616 m  AV Peak Grad:      32.2 mmHg  AV Mean Grad:      17.3 mmHg  LVOT Vmax:  80.67 cm/s  LVOT Vmean:        48.400 cm/s  LVOT VTI:          0.148 m  LVOT/AV VTI ratio: 0.24    AORTA  Ao Root diam: 4.40 cm  Ao Asc diam:  4.00 cm   MR Peak grad: 127.7 mmHg  MR Mean grad: 80.0 mmHg   SHUNTS  MR Vmax:      565.00 cm/s Systemic VTI:  0.15 m  MR Vmean:     408.7 cm/s  Systemic Diam: 2.20 cm   Lennie Odor MD  Electronically signed by Lennie Odor MD  Signature Date/Time: 08/10/2022/4:31:27 PM        Final        Physicians   Panel Physicians Referring Physician Case Authorizing Physician  Kathleene Hazel, MD (Primary)        Procedures   RIGHT HEART CATH AND CORONARY ANGIOGRAPHY    Conclusion       Prox RCA to Dist RCA lesion is 20% stenosed.   2nd Mrg lesion is 80% stenosed.   Ost LAD to Mid LAD lesion is 40% stenosed.   Mid LAD to Dist LAD lesion is 100% stenosed.   1st Diag lesion is 80% stenosed.   2nd Diag lesion is 90% stenosed.   CTO of the mid LAD. Severe disease in the moderate caliber diagonal branch Moderate caliber Circumflex with severe stenosis in the moderate caliber obtuse marginal branch Large dominant RCA. I could not engage this vessel selectively. The proximal, mid and distal vessel is patent with mild to moderate calcific disease. I could not visualize the distal branches or potential collaterals from the RCA to the distal LAD Normal right and left heart pressures   Recommendations: medical management of CAD. Continue workup for TAVR.      Indications   Severe aortic stenosis [I35.0 (ICD-10-CM)]    Procedural Details   Technical Details Indication: Severe AS, workup for TAVR  Procedure: The risks, benefits, complications, treatment options, and expected outcomes were discussed  with the patient. The patient and/or family concurred with the proposed plan, giving informed consent. The patient was sedated with Versed and Fentanyl. The IV catheter in the right antecubital vein was changed for a 7 [place] sheath. Right heart catheterization performed with a balloon tipped catheter. The right wrist was prepped and draped in a sterile fashion. 1% lidocaine was used for local anesthesia. Using the modified Seldinger access technique, a 5 French sheath was placed in the right radial artery using u/s guidance. 3 mg Verapamil was given through the sheath. Weight based IV heparin was given. Standard diagnostic catheters were used to perform selective coronary angiography. It was difficult to engage the RCA selectively due to tortuosity in the innominate artery. We did not have Destination sheaths available for use due to national backorder. I did not cross the aortic valve. All catheter exchanges were performed over an exchange length guidewire.   The sheath was removed from the right radial artery and a hemostasis band was applied at the arteriotomy site on the right wrist.      Estimated blood loss <50 mL.   During this procedure medications were administered to achieve and maintain moderate conscious sedation while the patient's heart rate, blood pressure, and oxygen saturation were continuously monitored and I was present face-to-face 100% of this time.    Medications (Filter: Administrations occurring from 1032 to 1150 on 09/09/22) Heparin (Porcine) in NaCl 1000-0.9 UT/500ML-% SOLN (mL)  Total volume: 1,000 mL Date/Time Rate/Dose/Volume Action    09/09/22 1044 500 mL Given    1044 500 mL Given    lidocaine (PF) (XYLOCAINE) 1 % injection (mL)  Total volume: 4 mL Date/Time Rate/Dose/Volume Action    09/09/22 1056 2 mL Given    1057 2 mL Given    midazolam (VERSED) injection (mg)  Total dose: 1 mg Date/Time Rate/Dose/Volume Action    09/09/22 1056 1 mg Given    fentaNYL  (SUBLIMAZE) injection (mcg)  Total dose: 25 mcg Date/Time Rate/Dose/Volume Action    09/09/22 1056 25 mcg Given    Radial Cocktail/Verapamil only (mL)  Total volume: 10 mL Date/Time Rate/Dose/Volume Action    09/09/22 1059 10 mL Given    heparin sodium (porcine) injection (Units)  Total dose: 5,000 Units Date/Time Rate/Dose/Volume Action    09/09/22 1106 5,000 Units Given    iohexol (OMNIPAQUE) 350 MG/ML injection (mL)  Total volume: 140 mL Date/Time Rate/Dose/Volume Action    09/09/22 1138 140 mL Given      Sedation Time   Sedation Time Physician-1: 36 minutes 59 seconds Contrast   Medication Name Total Dose  iohexol (OMNIPAQUE) 350 MG/ML injection 140 mL    Radiation/Fluoro   Fluoro time: 16.9 (min) DAP: 46253 (mGycm2) Cumulative Air Kerma: 815 (mGy) Complications   Complications documented before study signed (09/09/2022 12:07 PM)    No complications were associated with this study.  Documented by Johny SaxZafiris, Nicole E, RN - 09/09/2022 11:41 AM      Coronary Findings   Diagnostic Dominance: Right Left Anterior Descending  Vessel is large.  Ost LAD to Mid LAD lesion is 40% stenosed. The lesion is calcified.  Mid LAD to Dist LAD lesion is 100% stenosed. The lesion is chronically occluded.    First Diagonal Branch  1st Diag lesion is 80% stenosed.    Second Diagonal Branch  2nd Diag lesion is 90% stenosed.    Left Circumflex  Vessel is moderate in size.    First Obtuse Marginal Branch  Vessel is small in size.    Second Obtuse Marginal Branch  Vessel is moderate in size.  2nd Mrg lesion is 80% stenosed.    Right Coronary Artery  Vessel is large.  Prox RCA to Dist RCA lesion is 20% stenosed. The lesion is calcified.    Intervention    No interventions have been documented.    Coronary Diagrams   Diagnostic Dominance: Right  Intervention    Implants    No implant documentation for this case.    Syngo Images    Show images for CARDIAC  CATHETERIZATION Images on Long Term Storage    Show images for Alcide CleverBreeland, Perkins W Jr. Link to Procedure Log   Procedure Log    Hemo Data   Flowsheet Row Most Recent Value  Fick Cardiac Output 5.2 L/min  Fick Cardiac Output Index 2.32 (L/min)/BSA  RA A Wave 6 mmHg  RA V Wave 8 mmHg  RA Mean 8 mmHg  RV Systolic Pressure 33 mmHg  RV Diastolic Pressure 17 mmHg  RV EDP 5 mmHg  PA Systolic Pressure 34 mmHg  PA Diastolic Pressure 15 mmHg  PA Mean 24 mmHg  PW A Wave 14 mmHg  PW V Wave 15 mmHg  PW Mean 12 mmHg  QP/QS 1  TPVR Index 10.34 HRUI    ADDENDUM REPORT: 09/16/2022 12:15   EXAM: OVER-READ INTERPRETATION  CT CHEST   The following report is an over-read performed by radiologist Dr. Tacey RuizLeah  [facility], [place] on 09/16/2022. This over-read does not include interpretation of cardiac or coronary anatomy or pathology. The cardiac TAVR interpretation by the cardiologist is attached.   COMPARISON:  None.   FINDINGS: Extracardiac findings will be described separately under dictation for contemporaneously obtained CTA chest, abdomen and pelvis.   IMPRESSION: Please see separate dictation for contemporaneously obtained CTA chest, abdomen and pelvis dated 09/16/2022 for full description of relevant extracardiac findings.     Electronically Signed   By: Allegra Lai M.D.   On: 09/16/2022 12:15    Addended by Renford Dills, MD on 09/16/2022 12:17 PM    Study Result   Narrative & Impression  CLINICAL DATA:  32M with severe aortic stenosis being evaluated for a TAVR procedure.   EXAM: Cardiac TAVR CT   TECHNIQUE: The patient was scanned on a [employer]. A 120 kV retrospective scan was triggered in the descending thoracic aorta at 111 HU's. Gantry rotation speed was 250 msecs and collimation was .6 mm. No beta blockade or nitro were given. The 3D data set was reconstructed in 5% intervals of the R-R cycle. Systolic and diastolic  phases were analyzed on a dedicated work station using MPR, MIP and VRT modes. The patient received 100 cc of contrast.   FINDINGS: Aortic Root:   Aortic valve: Trileaflet   Aortic valve calcium score: 4304   Aortic annulus:   Diameter: 52mm x 40mm   Perimeter: 84mm   Area: 568mm^2   Calcifications: Mild calcifications adjacent to left coronary cusp   Coronary height: Min Left - 29mm,  Min Right - 34mm   Sinotubular height: Left cusp - 22mm; Right cusp - 5mm; Noncoronary cusp - 59mm   LVOT (as measured 3 mm below the annulus):   Diameter: 29mm x 28mm   Area: 522mm^2   Calcifications: Mild calcifications inferior to left coronary cusp   Aortic sinus width: Left cusp - 25mm; Right cusp - 20mm; Noncoronary cusp - 13mm   Sinotubular junction width: 65mm x 12mm   Optimum Fluoroscopic Angle for Delivery: LAO 11 CRA 14   Cardiac:   Right atrium: Moderate enlargement   Right ventricle: Moderate dilatation   Pulmonary arteries: Dilated main pulmonary artery measuring 60mm   Pulmonary veins: Normal configuration   Left atrium: Severe enlargement   Left ventricle: Mild dilatation   Pericardium: Normal thickness   Coronary arteries: Coronary calcium score 8367   IMPRESSION: 1. Trileaflet aortic valve with severe calcifications (AV calcium score 4304)   2. Aortic annulus measures 38mm x 23mm in diameter with perimeter 59mm and area 590 mm^2. There are mild annular calcifications adjacent to left coronary cusp and extending into LVOT. Annular measurements are suitable for delivery of 13mm Edwards Sapien 3 valve   3. Sufficient coronary to annulus distance.   4. Optimum Fluoroscopic Angle for Delivery:  LAO 11 CRA 14   5. Coronary calcium score 8367   Electronically Signed: By: Epifanio Lesches M.D. On: 09/16/2022 11:59          Narrative & Impression  CLINICAL DATA:  Preop evaluation for aortic valve replacement   EXAM: CT ANGIOGRAPHY  CHEST, ABDOMEN AND PELVIS   TECHNIQUE: Multidetector CT imaging through the chest, abdomen and pelvis was performed using the standard protocol during bolus administration of intravenous contrast. Multiplanar reconstructed images and MIPs were obtained and reviewed to evaluate the vascular anatomy.   RADIATION DOSE REDUCTION: This exam was performed according to the departmental dose-optimization program  which includes automated exposure control, adjustment of the mA and/or kV according to patient size and/or use of iterative reconstruction technique.   CONTRAST:  OMNIPAQUE IOHEXOL 350 MG/ML SOLN   COMPARISON:  Chest CT dated Feb 18, 2022   FINDINGS: CTA CHEST FINDINGS   Cardiovascular: Cardiomegaly. No pericardial effusion. Dilated ascending thoracic aorta, measuring to 4.3 cm, unchanged when compared with the prior exam. Moderate atherosclerotic disease of the thoracic aorta. Normal variant 2 vessel aortic arch with no significant stenosis. Aortic valve thickening and calcifications. Left main and three-vessel coronary artery calcifications. No suspicious filling defects of the central pulmonary arteries.   Mediastinum/Nodes: Esophagus and thyroid are unremarkable. No pathologically enlarged lymph nodes seen in the chest.   Lungs/Pleura: Central airways are patent. No consolidation, pleural effusion or pneumothorax. Bibasilar atelectasis.   Musculoskeletal: No chest wall abnormality. No acute or significant osseous findings.   CTA ABDOMEN AND PELVIS FINDINGS   Hepatobiliary: Mildly nodular liver contour. No suspicious liver lesions. Gallbladder is unremarkable. No biliary ductal dilation.   Pancreas: Unremarkable. No pancreatic ductal dilatation or surrounding inflammatory changes.   Spleen: Normal in size without focal abnormality.   Adrenals/Urinary Tract: Bilateral adrenal glands are unremarkable. No hydronephrosis. Nonobstructing left renal stone. Exophytic  simple appearing cyst of the upper pole of the right kidney, no specific follow-up imaging is recommended. Wall thickening of the urinary bladder with numerous trabeculations.   Stomach/Bowel: Stomach is within normal limits. Small duodenal diverticulum. Appendix appears normal. No evidence of bowel wall thickening, distention, or inflammatory changes.   Vascular/lymphatic: Normal caliber thoracic aorta with moderate atherosclerotic disease. Mild-to-moderate narrowing at the origins of the branch vessels. No pathologically enlarged lymph nodes seen in the abdomen or pelvis.   Reproductive: Prostate is unremarkable.   Other: No abdominal wall hernia or abnormality. No abdominopelvic ascites.   Musculoskeletal: No acute or significant osseous findings.   VASCULAR MEASUREMENTS PERTINENT TO TAVR:   AORTA:   Minimal Aortic Diameter-14.6 mm   Severity of Aortic Calcification-moderate   RIGHT PELVIS:   Right Common Iliac Artery -   Minimal Diameter-9.0 mm   Tortuosity-mild   Calcification-mild   Right External Iliac Artery -   Minimal Diameter-7.9 mm   Tortuosity-mild   Calcification-none   Right Common Femoral Artery -   Minimal Diameter-6.0 mm   Tortuosity-none   Calcification-moderate   LEFT PELVIS:   Left Common Iliac Artery -   Minimal Diameter-8.7 mm   Tortuosity-moderate   Calcification-mild   Left External Iliac Artery -   Minimal Diameter-8.2 mm   Tortuosity-mild   Calcification-none   Left Common Femoral Artery -   Minimal Diameter-8.6 mm   Tortuosity-none   Calcification-mild   Review of the MIP images confirms the above findings.   IMPRESSION: 1. Vascular findings and measurements pertinent to potential TAVR procedure, as detailed above. 2. Thickening and calcification of the aortic valve, compatible with reported clinical history of aortic stenosis. 3. Moderate aortoiliac atherosclerosis. Left main and 3 vessel coronary  artery disease. 4. Stable mild dilation of the ascending thoracic aorta, measuring up to 4.3 cm. Recommend annual imaging followup by CTA or MRA. This recommendation follows 2010 ACCF/AHA/AATS/ACR/ASA/SCA/SCAI/SIR/STS/SVM Guidelines for the Diagnosis and Management of Patients with Thoracic Aortic Disease. Circulation. 2010; 121: H419-F790. Aortic aneurysm NOS (ICD10-I71.9) 5. Mildly nodular liver contour, findings is suggestive of cirrhosis. 6. Wall thickening of the urinary bladder with numerous trabeculations, findings can be seen in the setting of chronic bladder outlet obstruction. 7. Nonobstructing left renal  stone.     Electronically Signed   By: Allegra Lai M.D.   On: 09/16/2022 12:16      Impression:   This 88 year old gentleman has stage D, severe, paradoxical, low-flow/low gradient aortic stenosis with New York Heart Association class III symptoms of exertional fatigue and shortness of breath consistent with chronic diastolic congestive heart failure as well as recurrent episodes of dizziness.  I have personally reviewed his 2D echocardiograms, cardiac catheterization, and CTA studies.  His recent echo shows a trileaflet aortic valve with severe calcification and thickening and restricted leaflet mobility.  The mean gradient is only 17 mmHg which has decreased from his prior echo in April 2023 when it was 34.5 mmHg.  This is most likely due to his reduced stroke-volume index of 25 and moderate concentric LVH.  His dimensionless index is 0.24 with an aortic valve area of 0.91 cm consistent with severe aortic stenosis.  His symptoms certainly are consistent with severe aortic stenosis.  Cardiac catheterization shows three-vessel coronary artery disease with a CTO of the mid LAD and no significant distal flow noted in the LAD.  There is severe stenosis of a moderate size diagonal branch and severe stenosis of a moderate size obtuse marginal branch.  The RCA is a large dominant  vessel with mild to moderate disease throughout.  Right and left heart pressures were normal.  The patient has no anginal symptoms and it is felt that medical treatment of his coronary disease would be best.  I agree that aortic valve replacement is indicated in this patient for relief of his progressive symptoms and to prevent left ventricular deterioration.  His gated cardiac CTA shows anatomy suitable for TAVR using a 29 mm SAPIEN 3 valve.  His abdominal and pelvic CTA shows adequate pelvic vascular anatomy to allow transfemoral insertion.  CTA of the chest shows a small ascending aortic aneurysm measured at 4.3 cm.   The patient and his daughter were counseled at length regarding treatment alternatives for management of severe symptomatic aortic stenosis. The risks and benefits of surgical intervention has been discussed in detail. Long-term prognosis with medical therapy was discussed. Alternative approaches such as conventional surgical aortic valve replacement, transcatheter aortic valve replacement, and palliative medical therapy were compared and contrasted at length. This discussion was placed in the context of the patient's own specific clinical presentation and past medical history. All of their questions have been addressed.    Following the decision to proceed with transcatheter aortic valve replacement, a discussion was held regarding what types of management strategies would be attempted intraoperatively in the event of life-threatening complications, including whether or not the patient would be considered a candidate for the use of cardiopulmonary bypass and/or conversion to open sternotomy for attempted surgical intervention.  Given his advanced age and frailty I do not think he would be a candidate for emergent sternotomy to manage any intraoperative complications.  The patient is aware of the fact that transient use of cardiopulmonary bypass may be necessary. The patient has been advised of  a variety of complications that might develop including but not limited to risks of death, stroke, paravalvular leak, aortic dissection or other major vascular complications, aortic annulus rupture, device embolization, cardiac rupture or perforation, mitral regurgitation, acute myocardial infarction, arrhythmia, heart block or bradycardia requiring permanent pacemaker placement, congestive heart failure, respiratory failure, renal failure, pneumonia, infection, other late complications related to structural valve deterioration or migration, or other complications that might ultimately cause a temporary or  permanent loss of functional independence or other long term morbidity. The patient provides full informed consent for the procedure as described and all questions were answered.       Plan:   Transfemoral TAVR using a SAPIEN 3 valve.  He has been on Coumadin for persistent atrial fibrillation and that would need to be stopped 5 days preoperatively.      Alleen Borne, MD

## 2022-10-18 ENCOUNTER — Other Ambulatory Visit: Payer: Self-pay | Admitting: Physician Assistant

## 2022-10-18 ENCOUNTER — Encounter (HOSPITAL_COMMUNITY)
Admission: RE | Disposition: A | Discharge status: Home / Self Care | Payer: Self-pay | Source: Home / Self Care | Attending: Cardiovascular Disease

## 2022-10-18 ENCOUNTER — Inpatient Hospital Stay (HOSPITAL_COMMUNITY): Payer: Medicare Other

## 2022-10-18 ENCOUNTER — Encounter (HOSPITAL_COMMUNITY): Payer: Self-pay | Admitting: Cardiovascular Disease

## 2022-10-18 ENCOUNTER — Other Ambulatory Visit: Payer: Self-pay

## 2022-10-18 ENCOUNTER — Inpatient Hospital Stay (HOSPITAL_COMMUNITY): Payer: Medicare Other | Admitting: Physician Assistant

## 2022-10-18 ENCOUNTER — Inpatient Hospital Stay (HOSPITAL_COMMUNITY): Payer: Medicare Other | Admitting: Certified Registered Nurse Anesthetist

## 2022-10-18 ENCOUNTER — Inpatient Hospital Stay (HOSPITAL_COMMUNITY)
Admission: RE | Admit: 2022-10-18 | Discharge: 2022-10-19 | DRG: 267 | Disposition: A | Discharge status: Home / Self Care | Payer: Medicare Other | Attending: Cardiovascular Disease | Admitting: Cardiovascular Disease

## 2022-10-18 DIAGNOSIS — Z952 Presence of prosthetic heart valve: Secondary | ICD-10-CM | POA: Diagnosis not present

## 2022-10-18 DIAGNOSIS — I4821 Permanent atrial fibrillation: Secondary | ICD-10-CM | POA: Diagnosis present

## 2022-10-18 DIAGNOSIS — I350 Nonrheumatic aortic (valve) stenosis: Secondary | ICD-10-CM

## 2022-10-18 DIAGNOSIS — I358 Other nonrheumatic aortic valve disorders: Secondary | ICD-10-CM | POA: Diagnosis not present

## 2022-10-18 DIAGNOSIS — Z87891 Personal history of nicotine dependence: Secondary | ICD-10-CM

## 2022-10-18 DIAGNOSIS — I2510 Atherosclerotic heart disease of native coronary artery without angina pectoris: Secondary | ICD-10-CM

## 2022-10-18 DIAGNOSIS — J449 Chronic obstructive pulmonary disease, unspecified: Secondary | ICD-10-CM | POA: Diagnosis not present

## 2022-10-18 DIAGNOSIS — R54 Age-related physical debility: Secondary | ICD-10-CM | POA: Diagnosis not present

## 2022-10-18 DIAGNOSIS — I10 Essential (primary) hypertension: Secondary | ICD-10-CM | POA: Diagnosis not present

## 2022-10-18 DIAGNOSIS — I7121 Aneurysm of the ascending aorta, without rupture: Secondary | ICD-10-CM | POA: Diagnosis not present

## 2022-10-18 DIAGNOSIS — Z006 Encounter for examination for normal comparison and control in clinical research program: Secondary | ICD-10-CM

## 2022-10-18 DIAGNOSIS — Z7901 Long term (current) use of anticoagulants: Secondary | ICD-10-CM | POA: Diagnosis not present

## 2022-10-18 DIAGNOSIS — I700 Atherosclerosis of aorta: Secondary | ICD-10-CM | POA: Diagnosis present

## 2022-10-18 DIAGNOSIS — H9190 Unspecified hearing loss, unspecified ear: Secondary | ICD-10-CM | POA: Diagnosis present

## 2022-10-18 DIAGNOSIS — Z79899 Other long term (current) drug therapy: Secondary | ICD-10-CM | POA: Diagnosis not present

## 2022-10-18 DIAGNOSIS — J9811 Atelectasis: Secondary | ICD-10-CM | POA: Diagnosis not present

## 2022-10-18 DIAGNOSIS — N400 Enlarged prostate without lower urinary tract symptoms: Secondary | ICD-10-CM | POA: Diagnosis not present

## 2022-10-18 DIAGNOSIS — Z8249 Family history of ischemic heart disease and other diseases of the circulatory system: Secondary | ICD-10-CM

## 2022-10-18 DIAGNOSIS — I878 Other specified disorders of veins: Secondary | ICD-10-CM | POA: Diagnosis present

## 2022-10-18 DIAGNOSIS — Z823 Family history of stroke: Secondary | ICD-10-CM | POA: Diagnosis not present

## 2022-10-18 DIAGNOSIS — I7789 Other specified disorders of arteries and arterioles: Secondary | ICD-10-CM | POA: Diagnosis present

## 2022-10-18 DIAGNOSIS — I712 Thoracic aortic aneurysm, without rupture, unspecified: Secondary | ICD-10-CM | POA: Diagnosis present

## 2022-10-18 HISTORY — DX: Nonrheumatic aortic (valve) stenosis: I35.0

## 2022-10-18 HISTORY — PX: INTRAOPERATIVE TRANSTHORACIC ECHOCARDIOGRAM: SHX6523

## 2022-10-18 HISTORY — DX: Presence of prosthetic heart valve: Z95.2

## 2022-10-18 HISTORY — PX: TRANSCATHETER AORTIC VALVE REPLACEMENT, TRANSFEMORAL: SHX6400

## 2022-10-18 HISTORY — DX: Other persistent atrial fibrillation: I48.19

## 2022-10-18 HISTORY — DX: Unspecified dementia, unspecified severity, without behavioral disturbance, psychotic disturbance, mood disturbance, and anxiety: F03.90

## 2022-10-18 LAB — POCT I-STAT, CHEM 8
BUN: 19 mg/dL (ref 8–23)
BUN: 18 mg/dL (ref 8–23)
Calcium, Ion: 1.22 mmol/L (ref 1.15–1.40)
Calcium, Ion: 1.23 mmol/L (ref 1.15–1.40)
Chloride: 103 mmol/L (ref 98–111)
Chloride: 105 mmol/L (ref 98–111)
Creatinine, Ser: 1.1 mg/dL (ref 0.61–1.24)
Creatinine, Ser: 1 mg/dL (ref 0.61–1.24)
Glucose, Bld: 104 mg/dL — ABNORMAL HIGH (ref 70–99)
Glucose, Bld: 113 mg/dL — ABNORMAL HIGH (ref 70–99)
HCT: 42 % (ref 39.0–52.0)
HCT: 38 % — ABNORMAL LOW (ref 39.0–52.0)
Hemoglobin: 14.3 g/dL (ref 13.0–17.0)
Hemoglobin: 12.9 g/dL — ABNORMAL LOW (ref 13.0–17.0)
Potassium: 4.1 mmol/L (ref 3.5–5.1)
Potassium: 3.9 mmol/L (ref 3.5–5.1)
Sodium: 143 mmol/L (ref 135–145)
Sodium: 142 mmol/L (ref 135–145)
TCO2: 27 mmol/L (ref 22–32)
TCO2: 24 mmol/L (ref 22–32)

## 2022-10-18 LAB — ECHOCARDIOGRAM LIMITED
AR max vel: 4.12 cm2
AV Area VTI: 4.72 cm2
AV Area mean vel: 3.94 cm2
AV Mean grad: 2 mmHg
AV Peak grad: 3.3 mmHg
Ao pk vel: 0.91 m/s

## 2022-10-18 LAB — ABO/RH: ABO/RH(D): A POS

## 2022-10-18 LAB — PROTIME-INR
INR: 1.1 (ref 0.8–1.2)
Prothrombin Time: 13.8 seconds (ref 11.4–15.2)

## 2022-10-18 SURGERY — IMPLANTATION, AORTIC VALVE, TRANSCATHETER, FEMORAL APPROACH
Anesthesia: Monitor Anesthesia Care

## 2022-10-18 MED ORDER — PROPOFOL 500 MG/50ML IV EMUL
INTRAVENOUS | Status: DC | PRN
  Administered 2022-10-18: 10 ug/kg/min via INTRAVENOUS

## 2022-10-18 MED ORDER — HEPARIN (PORCINE) IN NACL 1000-0.9 UT/500ML-% IV SOLN
INTRAVENOUS | Status: DC | PRN
  Administered 2022-10-18 (×3): 500 mL

## 2022-10-18 MED ORDER — UMECLIDINIUM BROMIDE 62.5 MCG/ACT IN AEPB
1.0000 | INHALATION_SPRAY | Freq: Every day | RESPIRATORY_TRACT | Status: DC
  Administered 2022-10-19: 1 via RESPIRATORY_TRACT
  Filled 2022-10-18: qty 7

## 2022-10-18 MED ORDER — SODIUM CHLORIDE 0.9 % IV SOLN
INTRAVENOUS | Status: AC
  Administered 2022-10-18: 50 mL/h via INTRAVENOUS

## 2022-10-18 MED ORDER — CHLORHEXIDINE GLUCONATE 0.12 % MT SOLN
15.0000 mL | Freq: Once | OROMUCOSAL | Status: AC
  Administered 2022-10-18 – 2022-10-19 (×2): 15 mL via OROMUCOSAL
  Filled 2022-10-18 (×2): qty 15

## 2022-10-18 MED ORDER — FINASTERIDE 5 MG PO TABS
5.0000 mg | ORAL_TABLET | Freq: Every day | ORAL | Status: DC
  Administered 2022-10-18 – 2022-10-19 (×2): 5 mg via ORAL
  Filled 2022-10-18 (×2): qty 1

## 2022-10-18 MED ORDER — NITROGLYCERIN IN D5W 200-5 MCG/ML-% IV SOLN
0.0000 ug/min | INTRAVENOUS | Status: DC
  Filled 2022-10-18: qty 250

## 2022-10-18 MED ORDER — FLUTICASONE FUROATE-VILANTEROL 100-25 MCG/ACT IN AEPB
1.0000 | INHALATION_SPRAY | Freq: Every day | RESPIRATORY_TRACT | Status: DC
  Filled 2022-10-18: qty 28

## 2022-10-18 MED ORDER — DOCUSATE SODIUM 100 MG PO CAPS
100.0000 mg | ORAL_CAPSULE | Freq: Every day | ORAL | Status: DC
  Administered 2022-10-18 – 2022-10-19 (×2): 100 mg via ORAL
  Filled 2022-10-18 (×2): qty 1

## 2022-10-18 MED ORDER — ACETAMINOPHEN 650 MG RE SUPP: 650.0000 mg | Freq: Four times a day (QID) | RECTAL | Status: DC | PRN

## 2022-10-18 MED ORDER — LIDOCAINE HCL (PF) 1 % IJ SOLN
INTRAMUSCULAR | Status: AC
  Filled 2022-10-18: qty 30

## 2022-10-18 MED ORDER — GLYCOPYRROLATE PF 0.2 MG/ML IJ SOSY
PREFILLED_SYRINGE | INTRAMUSCULAR | Status: DC | PRN
  Administered 2022-10-18 (×2): .1 mg via INTRAVENOUS

## 2022-10-18 MED ORDER — LACTATED RINGERS IV SOLN: INTRAVENOUS | Status: DC

## 2022-10-18 MED ORDER — ACETAMINOPHEN 500 MG PO TABS
1000.0000 mg | ORAL_TABLET | Freq: Once | ORAL | Status: AC
  Administered 2022-10-18: 1000 mg via ORAL
  Filled 2022-10-18: qty 2

## 2022-10-18 MED ORDER — UMECLIDINIUM BROMIDE 62.5 MCG/ACT IN AEPB
1.0000 | INHALATION_SPRAY | Freq: Every day | RESPIRATORY_TRACT | Status: DC
  Filled 2022-10-18: qty 7

## 2022-10-18 MED ORDER — LACTATED RINGERS IV SOLN: INTRAVENOUS | Status: DC | PRN

## 2022-10-18 MED ORDER — ACETAMINOPHEN 325 MG PO TABS: 650.0000 mg | ORAL_TABLET | Freq: Four times a day (QID) | ORAL | Status: DC | PRN

## 2022-10-18 MED ORDER — HEPARIN (PORCINE) IN NACL 1000-0.9 UT/500ML-% IV SOLN
INTRAVENOUS | Status: AC
  Filled 2022-10-18: qty 500

## 2022-10-18 MED ORDER — CEFAZOLIN SODIUM-DEXTROSE 2-4 GM/100ML-% IV SOLN
2.0000 g | Freq: Three times a day (TID) | INTRAVENOUS | Status: AC
  Administered 2022-10-18 (×2): 2 g via INTRAVENOUS
  Filled 2022-10-18 (×2): qty 100

## 2022-10-18 MED ORDER — OXYCODONE HCL 5 MG PO TABS: 5.0000 mg | ORAL_TABLET | ORAL | Status: DC | PRN

## 2022-10-18 MED ORDER — MEMANTINE HCL 10 MG PO TABS
10.0000 mg | ORAL_TABLET | Freq: Two times a day (BID) | ORAL | Status: DC
  Administered 2022-10-18 – 2022-10-19 (×2): 10 mg via ORAL
  Filled 2022-10-18 (×2): qty 1

## 2022-10-18 MED ORDER — LIDOCAINE HCL (PF) 1 % IJ SOLN
INTRAMUSCULAR | Status: DC | PRN
  Administered 2022-10-18 (×2): 10 mL

## 2022-10-18 MED ORDER — HEPARIN (PORCINE) IN NACL 1000-0.9 UT/500ML-% IV SOLN
INTRAVENOUS | Status: AC
  Filled 2022-10-18: qty 1000

## 2022-10-18 MED ORDER — CHLORHEXIDINE GLUCONATE 4 % EX LIQD
60.0000 mL | Freq: Once | CUTANEOUS | Status: DC
  Filled 2022-10-18: qty 60

## 2022-10-18 MED ORDER — MORPHINE SULFATE (PF) 2 MG/ML IV SOLN: 1.0000 mg | INTRAVENOUS | Status: DC | PRN

## 2022-10-18 MED ORDER — IOHEXOL 350 MG/ML SOLN
INTRAVENOUS | Status: DC | PRN
  Administered 2022-10-18: 40 mL

## 2022-10-18 MED ORDER — LORATADINE 10 MG PO TABS
10.0000 mg | ORAL_TABLET | Freq: Every day | ORAL | Status: DC
  Administered 2022-10-18 – 2022-10-19 (×2): 10 mg via ORAL
  Filled 2022-10-18 (×2): qty 1

## 2022-10-18 MED ORDER — TOBRAMYCIN-DEXAMETHASONE 0.3-0.1 % OP SUSP
1.0000 [drp] | Freq: Four times a day (QID) | OPHTHALMIC | Status: DC
  Administered 2022-10-18 – 2022-10-19 (×2): 1 [drp] via OPHTHALMIC
  Filled 2022-10-18: qty 2.5

## 2022-10-18 MED ORDER — SODIUM CHLORIDE 0.9 % IV SOLN: INTRAVENOUS | Status: DC

## 2022-10-18 MED ORDER — SODIUM CHLORIDE 0.9% FLUSH: 3.0000 mL | INTRAVENOUS | Status: DC | PRN

## 2022-10-18 MED ORDER — SODIUM CHLORIDE 0.9% FLUSH: 3.0000 mL | Freq: Two times a day (BID) | INTRAVENOUS | Status: DC

## 2022-10-18 MED ORDER — CHLORHEXIDINE GLUCONATE 4 % EX LIQD
30.0000 mL | CUTANEOUS | Status: DC
  Filled 2022-10-18: qty 30

## 2022-10-18 MED ORDER — PROTAMINE SULFATE 10 MG/ML IV SOLN
INTRAVENOUS | Status: DC | PRN
  Administered 2022-10-18: 140 mg via INTRAVENOUS
  Administered 2022-10-18: 10 mg via INTRAVENOUS

## 2022-10-18 MED ORDER — FLUTICASONE FUROATE-VILANTEROL 100-25 MCG/ACT IN AEPB
1.0000 | INHALATION_SPRAY | Freq: Every day | RESPIRATORY_TRACT | Status: DC
  Administered 2022-10-19: 1 via RESPIRATORY_TRACT
  Filled 2022-10-18: qty 28

## 2022-10-18 MED ORDER — TRAMADOL HCL 50 MG PO TABS: 50.0000 mg | ORAL_TABLET | ORAL | Status: DC | PRN

## 2022-10-18 MED ORDER — SODIUM CHLORIDE 0.9 % IV SOLN: 250.0000 mL | INTRAVENOUS | Status: DC | PRN

## 2022-10-18 MED ORDER — PHENYLEPHRINE 80 MCG/ML (10ML) SYRINGE FOR IV PUSH (FOR BLOOD PRESSURE SUPPORT)
PREFILLED_SYRINGE | INTRAVENOUS | Status: DC | PRN
  Administered 2022-10-18 (×5): 80 ug via INTRAVENOUS

## 2022-10-18 MED ORDER — ONDANSETRON HCL 4 MG/2ML IJ SOLN
4.0000 mg | Freq: Four times a day (QID) | INTRAMUSCULAR | Status: DC | PRN
  Administered 2022-10-18: 4 mg via INTRAVENOUS
  Filled 2022-10-18: qty 2

## 2022-10-18 MED ORDER — HEPARIN SODIUM (PORCINE) 1000 UNIT/ML IJ SOLN
INTRAMUSCULAR | Status: DC | PRN
  Administered 2022-10-18: 15000 [IU] via INTRAVENOUS

## 2022-10-18 SURGICAL SUPPLY — 30 items
BAG SNAP BAND KOVER 36X36 (MISCELLANEOUS) ×2 IMPLANT
CATH COMMANDER DELIVERY SYS 29 (CATHETERS) IMPLANT
CATH DIAG 6FR PIGTAIL ANGLED (CATHETERS) IMPLANT
CATH INFINITI 6F AL2 (CATHETERS) IMPLANT
CATH S G BIP PACING (CATHETERS) IMPLANT
CLOSURE MYNX CONTROL 6F/7F (Vascular Products) IMPLANT
CLOSURE PERCLOSE PROSTYLE (VASCULAR PRODUCTS) IMPLANT
CRIMPER (MISCELLANEOUS) IMPLANT
DEVICE INFLATION ATRION QL38 (MISCELLANEOUS) IMPLANT
KIT HEART LEFT (KITS) ×1 IMPLANT
KIT MICROPUNCTURE NIT STIFF (SHEATH) IMPLANT
KIT SAPIAN 3 ULTRA RESILIA 29 (Valve) IMPLANT
PACK CARDIAC CATHETERIZATION (CUSTOM PROCEDURE TRAY) ×1 IMPLANT
PINNACLE LONG 7F 25CM (SHEATH) ×1
PROTECTION STATION PRESSURIZED (MISCELLANEOUS) ×1
SHEATH INTRO PINNACLE 7F 25CM (SHEATH) IMPLANT
SHEATH INTRODUCER SET 29 (SHEATH) IMPLANT
SHEATH PINNACLE 6F 10CM (SHEATH) IMPLANT
SHEATH PINNACLE 8F 10CM (SHEATH) IMPLANT
SHEATH PROBE COVER 6X72 (BAG) IMPLANT
STATION PROTECTION PRESSURIZED (MISCELLANEOUS) IMPLANT
STOPCOCK MORSE 400PSI 3WAY (MISCELLANEOUS) ×2 IMPLANT
SYR MEDRAD MARK V 150ML (SYRINGE) IMPLANT
TRANSDUCER W/STOPCOCK (MISCELLANEOUS) ×2 IMPLANT
TUBING CONTRAST HIGH PRESS 48 (TUBING) IMPLANT
WIRE AMPLATZ SS-J .035X180CM (WIRE) IMPLANT
WIRE EMERALD 3MM-J .035X150CM (WIRE) IMPLANT
WIRE EMERALD 3MM-J .035X260CM (WIRE) IMPLANT
WIRE EMERALD ST .035X260CM (WIRE) IMPLANT
WIRE SAFARI SM CURVE 275 (WIRE) IMPLANT

## 2022-10-18 NOTE — Progress Notes (Signed)
Pt arrived to 4E21 from cath lab.   A&O.  CCMD notified.  VS per protocol

## 2022-10-18 NOTE — Interval H&P Note (Signed)
History and Physical Interval Note:  10/18/2022 6:31 AM  Ulis Rias Mannie Stabile.  has presented today for surgery, with the diagnosis of Severe Aortic Stenosis.  The various methods of treatment have been discussed with the patient and family. After consideration of risks, benefits and other options for treatment, the patient has consented to  Procedure(s): Transcatheter Aortic Valve Replacement, Transfemoral (N/A) INTRAOPERATIVE TRANSTHORACIC ECHOCARDIOGRAM (N/A) as a surgical intervention.  The patient's history has been reviewed, patient examined, no change in status, stable for surgery.  I have reviewed the patient's chart and labs.  Questions were answered to the patient's satisfaction.     Gaye Pollack

## 2022-10-18 NOTE — Anesthesia Postprocedure Evaluation (Signed)
Anesthesia Post Note  Patient: Warren Wagner.  Procedure(s) Performed: Transcatheter Aortic Valve Replacement, Transfemoral INTRAOPERATIVE TRANSTHORACIC ECHOCARDIOGRAM     Patient location during evaluation: Cath Lab Anesthesia Type: MAC Level of consciousness: awake and alert, patient cooperative and oriented Pain management: pain level controlled Vital Signs Assessment: post-procedure vital signs reviewed and stable Respiratory status: nonlabored ventilation, spontaneous breathing, respiratory function stable and patient connected to nasal cannula oxygen Cardiovascular status: stable (remains on Norepi gtt) Postop Assessment: no apparent nausea or vomiting Anesthetic complications: no   There were no known notable events for this encounter.  Last Vitals:  Vitals:   10/18/22 1017 10/18/22 1020  BP: (!) 87/46 96/60  Pulse: (!) 46 (!) 50  Resp: 19 15  Temp:    SpO2: 93% 99%    Last Pain:  Vitals:   10/18/22 0945  TempSrc:   PainSc: 0-No pain                 Tam Delisle,E. Deniah Saia

## 2022-10-18 NOTE — Progress Notes (Addendum)
  [facility] VALVE TEAM  Patient doing well s/p TAVR. He is hemodynamically stable bu BPs are on soft side, esp when sleeping. On very small amount of levo. Will try to wean. Groin sites stable. ECG with afib but no high grade block, HR noted to dip into 30-40s. 50s pre op. If we can get off pressors and HR stable, dc A line and send to 4E. Plan for early ambulation after bedrest completed and hopeful discharge over the next 24-48 hours.   Angelena Form PA-C  [facility]  Pager [phone]

## 2022-10-18 NOTE — Anesthesia Procedure Notes (Signed)
Arterial Line Insertion Start/End1/06/2023 7:10 AM, 10/18/2022 7:15 AM Performed by: Annye Asa, MD, Griffin Dakin, CRNA, CRNA  Patient location: Pre-op. Preanesthetic checklist: patient identified, IV checked, site marked, risks and benefits discussed, surgical consent, monitors and equipment checked, pre-op evaluation, timeout performed and anesthesia consent Lidocaine 1% used for infiltration Left, radial was placed Catheter size: 20 G Hand hygiene performed  and maximum sterile barriers used   Attempts: 1 Procedure performed without using ultrasound guided technique. Following insertion, dressing applied and Biopatch. Post procedure assessment: normal and unchanged  Patient tolerated the procedure well with no immediate complications.

## 2022-10-18 NOTE — Progress Notes (Signed)
  Echocardiogram 2D Echocardiogram limited has been performed.  Darlina Sicilian M 10/18/2022, 9:25 AM

## 2022-10-18 NOTE — Transfer of Care (Signed)
Immediate Anesthesia Transfer of Care Note  Patient: Warren Wagner.  Procedure(s) Performed: Transcatheter Aortic Valve Replacement, Transfemoral INTRAOPERATIVE TRANSTHORACIC ECHOCARDIOGRAM  Patient Location: Cath Lab  Anesthesia Type:MAC  Level of Consciousness: awake, alert , and oriented  Airway & Oxygen Therapy: Patient Spontanous Breathing and Patient connected to nasal cannula oxygen  Post-op Assessment: Report given to RN and Post -op Vital signs reviewed and stable, norepi on  Post vital signs: Reviewed and stable  Last Vitals:  Vitals Value Taken Time  BP 83/55 10/18/22 0944  Temp    Pulse 48 10/18/22 0947  Resp 11 10/18/22 0947  SpO2 99 % 10/18/22 0947  Vitals shown include unvalidated device data.  Last Pain:  Vitals:   10/18/22 0945  TempSrc:   PainSc: 0-No pain         Complications: There were no known notable events for this encounter.

## 2022-10-18 NOTE — Discharge Summary (Incomplete)
[facility]   MULTIDISCIPLINARY HEART VALVE TEAM  Discharge Summary    Patient ID: Warren Wagner. MRN: [id number]; DOB: 10-31-1934  Admit date: 10/18/2022 Discharge date: 10/18/2022  Primary Care Provider: Mayer Masker, PA-C  Primary Cardiologist: [name], MD / Dr. Clifton James & Dr. Laneta Simmers (TAVR)  Discharge Diagnoses    Principal Problem:   S/P TAVR (transcatheter aortic valve replacement) Active Problems:   Atrial fibrillation, permanent (HCC)   Essential hypertension   Long term (current) use of anticoagulants   Aneurysm, aorta, thoracic (HCC)   Venous stasis of lower extremity   Benign prostatic hyperplasia   COPD (chronic obstructive pulmonary disease) (HCC)   Severe aortic stenosis   Aortic root enlargement (HCC)   Allergies No Known Allergies  Diagnostic Studies/Procedures    TAVR OPERATIVE NOTE     Date of Procedure:                10/18/2022   Preoperative Diagnosis:      Severe Aortic Stenosis    Postoperative Diagnosis:    Same    Procedure:        Transcatheter Aortic Valve Replacement - Transfemoral Approach             Edwards Sapien 3 THV (size 29 mm, model # [id number], serial # [id number] )              Co-Surgeons:                        Verne Carrow, MD and Evelene Croon , MD    Anesthesiologist:                  Jean Rosenthal   Echocardiographer:              O'Neal   Pre-operative Echo Findings: Severe aortic stenosis Normal left ventricular systolic function   Post-operative Echo Findings: No paravalvular leak Normal left ventricular systolic function   _____________    Echo 10/19/22: completed but pending formal read at the time of discharge   History of Present Illness     Warren Noy. is a 88 y.o. male with a history of permanent atrial fibrillation on coumadin, COPD, venous insufficiency, HTN, thoracic aortic aneurysm and severe paradoxical low flow/low gradient aortic stenosis who presented to  [facility] on 10/18/22 for planned TAVR.   He has been followed with what was felt to be moderate aortic stenosis. Echo in 01/2022 showed a mean gradient of 34.5 mmHg, a peak gradient of 50.3 mmHg, and a valve area of 0.82 cm. He then presented with a several month history of progressive exertional shortness of breath and fatigue as well as recurrent episodes of dizziness particularly with standing up. He had lower extremity edema that improved with diuretics. He was in the emergency room on 09/26/2022 feeling poorly with generalized weakness and dizziness and could not walk to the bathroom.  He was seen by cardiology and felt to be orthostatic and his Lasix was decreased to 20 mg daily. His most recent echocardiogram on 08/10/2022 showed EF 65%, and severe LFLG AS with mean grad 17.3 mmHg, peak grad 32.2 mmHg, AVA 0.91cm2, DVI 0.24, SVI 25, and mild MR. [facility] 09/09/22 showed CTO of the mid LAD, severe disease in the moderate caliber diagonal branch, moderate caliber Circumflex with severe stenosis in the moderate caliber obtuse marginal branch and a large dominant RCA. Dr. Aundra Dubin could not engage this vessel selectively. The  proximal, mid and distal vessel was patent with mild to moderate calcific disease. There were normal right and left heart pressures. Medical management of CAD was recommended.  The patient was evaluated by the multidisciplinary valve team and felt to have severe, symptomatic aortic stenosis and to be a suitable candidate for TAVR, which was set up for 10/18/22.   Hospital Course     Consultants: none   Severe AS: s/p successful TAVR with a 29 mm Edwards Sapien 3 Ultra Resilia THV via the TF approach on 10/18/22. Post operative echo completed but pending formal read. Groin sites are stable. ECG with *** and no high grade heart block. Resume on home coumadin.   Permanent afib: rates have been stable. He has an INR check 1/166 in [place].   HTN: BP stable. Resume home meds. ***  TAA: pre TAVR  CT showed "stable mild dilation of the ascending thoracic aorta, measuring up to 4.3 cm. Recommend annual imaging followup by CTA or MRA." This will be followed over time.    _____________  Discharge Vitals Blood pressure (!) 100/53, pulse (!) 57, temperature (!) 97.2 F (36.2 C), resp. rate 20, height 6' (1.829 m), weight 103 kg, SpO2 98 %.  Filed Weights   10/17/22 1300 10/18/22 0555  Weight: 108.9 kg 103 kg    *** physical exam  Labs & Radiologic Studies    CBC Recent Labs    10/18/22 0807  HGB 14.3  HCT 65.0   Basic Metabolic Panel Recent Labs    10/18/22 0807  NA 143  K 4.1  CL 103  GLUCOSE 104*  BUN 19  CREATININE 1.10   Liver Function Tests No results for input(s): "AST", "ALT", "ALKPHOS", "BILITOT", "PROT", "ALBUMIN" in the last 72 hours. No results for input(s): "LIPASE", "AMYLASE" in the last 72 hours. Cardiac Enzymes No results for input(s): "CKTOTAL", "CKMB", "CKMBINDEX", "TROPONINI" in the last 72 hours. BNP Invalid input(s): "POCBNP" D-Dimer No results for input(s): "DDIMER" in the last 72 hours. Hemoglobin A1C No results for input(s): "HGBA1C" in the last 72 hours. Fasting Lipid Panel No results for input(s): "CHOL", "HDL", "LDLCALC", "TRIG", "CHOLHDL", "LDLDIRECT" in the last 72 hours. Thyroid Function Tests No results for input(s): "TSH", "T4TOTAL", "T3FREE", "THYROIDAB" in the last 72 hours.  Invalid input(s): "FREET3" _____________  Structural Heart Procedure  Result Date: 10/18/2022 See surgical note for result.  DG Chest 2 View  Result Date: 10/14/2022 CLINICAL DATA:  Preop EXAM: CHEST - 2 VIEW COMPARISON:  Chest x-ray 09/26/2022 FINDINGS: Heart is mildly enlarged, unchanged. The lungs are clear. There is no pleural effusion or pneumothorax. No acute fractures are seen. IMPRESSION: No active cardiopulmonary disease. Electronically Signed   By: Ronney Asters M.D.   On: 10/14/2022 23:38   DG Chest 2 View  Result Date:  09/26/2022 CLINICAL DATA:  Near syncope.  Weakness. EXAM: CHEST - 2 VIEW COMPARISON:  July 08, 2022 FINDINGS: Stable cardiomegaly. The hila and mediastinum are unremarkable. No pneumothorax. Mild opacity in the left base. No other acute abnormalities. IMPRESSION: Suspected small left effusion with underlying opacity. No other acute abnormalities. Electronically Signed   By: Dorise Bullion III M.D.   On: 09/26/2022 10:59   Disposition   Pt is being discharged home today in good condition.  Follow-up Plans & Appointments     Follow-up Information     Eileen Stanford, PA-C Follow up on 10/28/2022.   Specialties: Cardiology, Radiology Why: @ 9:30am, please arrive at least 10 minutes early Contact  information: [address] [place] [address] [phone]                  Discharge Medications   Allergies as of 10/18/2022   No Known Allergies   Med Rec must be completed prior to using this SMARTLINK***           Outstanding Labs/Studies   ***  Duration of Discharge Encounter   Greater than 30 minutes including physician time.  SignedCline Crock, PA-C 10/18/2022, 12:44 PM [phone]

## 2022-10-18 NOTE — Op Note (Signed)
[facility]   MULTIDISCIPLINARY HEART VALVE TEAM   TAVR OPERATIVE NOTE   Date of Procedure:  10/18/2022  Preoperative Diagnosis: Severe Aortic Stenosis   Postoperative Diagnosis: Same   Procedure:   Transcatheter Aortic Valve Replacement - Percutaneous Right Transfemoral Approach  Edwards Sapien 3 Ultra ResiliaTHV (size 29 mm, model # [id number], serial # [id number])   Co-Surgeons:  Gaye Pollack, MD and Lauree Chandler, MD   Anesthesiologist:  Annye Asa, MD  Echocardiographer:  Marry Guan, MD  Pre-operative Echo Findings: Severe aortic stenosis Normal left ventricular systolic function  Post-operative Echo Findings: No paravalvular leak Normal left ventricular systolic function   BRIEF CLINICAL NOTE AND INDICATIONS FOR SURGERY  This 88 year old gentleman has stage D, severe, paradoxical, low-flow/low gradient aortic stenosis with New York Heart Association class III symptoms of exertional fatigue and shortness of breath consistent with chronic diastolic congestive heart failure as well as recurrent episodes of dizziness.  I have personally reviewed his 2D echocardiograms, cardiac catheterization, and CTA studies.  His recent echo shows a trileaflet aortic valve with severe calcification and thickening and restricted leaflet mobility.  The mean gradient is only 17 mmHg which has decreased from his prior echo in April 2023 when it was 34.5 mmHg.  This is most likely due to his reduced stroke-volume index of 25 and moderate concentric LVH.  His dimensionless index is 0.24 with an aortic valve area of 0.91 cm consistent with severe aortic stenosis.  His symptoms certainly are consistent with severe aortic stenosis.  Cardiac catheterization shows three-vessel coronary artery disease with a CTO of the mid LAD and no significant distal flow noted in the LAD.  There is severe stenosis of a moderate size diagonal branch and severe stenosis of a moderate size obtuse  marginal branch.  The RCA is a large dominant vessel with mild to moderate disease throughout.  Right and left heart pressures were normal.  The patient has no anginal symptoms and it is felt that medical treatment of his coronary disease would be best.  I agree that aortic valve replacement is indicated in this patient for relief of his progressive symptoms and to prevent left ventricular deterioration.  His gated cardiac CTA shows anatomy suitable for TAVR using a 29 mm SAPIEN 3 valve.  His abdominal and pelvic CTA shows adequate pelvic vascular anatomy to allow transfemoral insertion.  CTA of the chest shows a small ascending aortic aneurysm measured at 4.3 cm.   The patient and his daughter were counseled at length regarding treatment alternatives for management of severe symptomatic aortic stenosis. The risks and benefits of surgical intervention has been discussed in detail. Long-term prognosis with medical therapy was discussed. Alternative approaches such as conventional surgical aortic valve replacement, transcatheter aortic valve replacement, and palliative medical therapy were compared and contrasted at length. This discussion was placed in the context of the patient's own specific clinical presentation and past medical history. All of their questions have been addressed.    Following the decision to proceed with transcatheter aortic valve replacement, a discussion was held regarding what types of management strategies would be attempted intraoperatively in the event of life-threatening complications, including whether or not the patient would be considered a candidate for the use of cardiopulmonary bypass and/or conversion to open sternotomy for attempted surgical intervention.  Given his advanced age and frailty I do not think he would be a candidate for emergent sternotomy to manage any intraoperative complications.  The patient is aware of the  fact that transient use of cardiopulmonary bypass may  be necessary. The patient has been advised of a variety of complications that might develop including but not limited to risks of death, stroke, paravalvular leak, aortic dissection or other major vascular complications, aortic annulus rupture, device embolization, cardiac rupture or perforation, mitral regurgitation, acute myocardial infarction, arrhythmia, heart block or bradycardia requiring permanent pacemaker placement, congestive heart failure, respiratory failure, renal failure, pneumonia, infection, other late complications related to structural valve deterioration or migration, or other complications that might ultimately cause a temporary or permanent loss of functional independence or other long term morbidity. The patient provides full informed consent for the procedure as described and all questions were answered.     DETAILS OF THE OPERATIVE PROCEDURE  PREPARATION:    The patient was brought to the operating room on the above mentioned date and appropriate monitoring was established by the anesthesia team. The patient was placed in the supine position on the operating table.  Intravenous antibiotics were administered. The patient was monitored closely throughout the procedure under conscious sedation.    Baseline transthoracic echocardiogram was performed. The patient's abdomen and both groins were prepped and draped in a sterile manner. A time out procedure was performed.   PERIPHERAL ACCESS:    Using the modified Seldinger technique, femoral arterial and venous access was obtained with placement of 6 Fr sheaths on the left side.  A pigtail diagnostic catheter was passed through the left arterial sheath under fluoroscopic guidance into the aortic root.  A temporary transvenous pacemaker catheter was passed through the left femoral venous sheath under fluoroscopic guidance into the right ventricle.  The pacemaker was tested to ensure stable lead placement and pacemaker capture. Aortic  root angiography was performed in order to determine the optimal angiographic angle for valve deployment.   TRANSFEMORAL ACCESS:   Percutaneous transfemoral access and sheath placement was performed using ultrasound guidance.  The right common femoral artery was cannulated using a micropuncture needle and appropriate location was verified using hand injection angiogram.  A pair of Abbott Perclose percutaneous closure devices were placed and a 6 French sheath replaced into the femoral artery.  The patient was heparinized systemically and ACT verified > 250 seconds.    A 16 Fr transfemoral E-sheath was introduced into the right common femoral artery after progressively dilating over an Amplatz superstiff wire. An AL-2 catheter was used to direct a straight-tip exchange length wire across the native aortic valve into the left ventricle. This was exchanged out for a pigtail catheter and position was confirmed in the LV apex. Simultaneous LV and Ao pressures were recorded.  The pigtail catheter was exchanged for a Safari wire in the LV apex.   BALLOON AORTIC VALVULOPLASTY:   Not performed    TRANSCATHETER HEART VALVE DEPLOYMENT:   An Edwards Sapien 3 Ultra transcatheter heart valve (size 29 mm) was prepared and crimped per manufacturer's guidelines, and the proper orientation of the valve is confirmed on the [employer] delivery system. The valve was advanced through the introducer sheath using normal technique until in an appropriate position in the abdominal aorta beyond the sheath tip. The balloon was then retracted and using the fine-tuning wheel was centered on the valve. The valve was then advanced across the aortic arch using appropriate flexion of the catheter. The valve was carefully positioned across the aortic valve annulus. The Commander catheter was retracted using normal technique. Once final position of the valve has been confirmed by angiographic assessment, the  valve is deployed  during rapid ventricular pacing to maintain systolic blood pressure < 50 mmHg and pulse pressure < 10 mmHg. The balloon inflation is held for >3 seconds after reaching full deployment volume. Once the balloon has fully deflated the balloon is retracted into the ascending aorta and valve function is assessed using echocardiography. There is felt to be no paravalvular leak and no central aortic insufficiency.  The patient's hemodynamic recovery following valve deployment is good.  The deployment balloon and guidewire are both removed.    PROCEDURE COMPLETION:   The sheath was removed and femoral artery closure performed.  Protamine was administered once femoral arterial repair was complete. The temporary pacemaker, pigtail catheter and femoral sheaths were removed with manual pressure used for venous hemostasis.  A Mynx femoral closure device was utilized following removal of the diagnostic sheath in the left femoral artery.  The patient tolerated the procedure well and is transported to the cath lab recovery area in stable condition. There were no immediate intraoperative complications. All sponge instrument and needle counts are verified correct at completion of the operation.   No blood products were administered during the operation.  The patient received a total of 40 mL of intravenous contrast during the procedure.   Gaye Pollack, MD 10/18/2022

## 2022-10-18 NOTE — Progress Notes (Signed)
Mobility Specialist Progress Note:   10/18/22 1554  Mobility  Activity Ambulated with assistance in hallway  Level of Assistance Standby assist, set-up cues, supervision of patient - no hands on  Assistive Device Front wheel walker  Distance Ambulated (ft) 50 ft  Activity Response Tolerated well  $Mobility charge 1 Mobility   Pre- Mobility: 60 HR;  125/76 BP;  97% SpO2 Post Mobility:   75 HR;  129/77 BP;  98% SpO2  Pt in bed willing to participate in mobility. No complaints of pain. Left in bed with call bell in reach and all needs met.   Gareth Eagle Dillion Stowers Mobility Specialist Please contact via [facility] [employer] or  Rehab Office at [phone]

## 2022-10-18 NOTE — Anesthesia Procedure Notes (Signed)
Procedure Name: MAC Date/Time: 10/18/2022 7:45 AM  Performed by: Griffin Dakin, CRNAPre-anesthesia Checklist: Patient identified, Emergency Drugs available, Suction available, Patient being monitored and Timeout performed Patient Re-evaluated:Patient Re-evaluated prior to induction Oxygen Delivery Method: Simple face mask Preoxygenation: Pre-oxygenation with 100% oxygen Induction Type: IV induction Ventilation: Oral airway inserted - appropriate to patient size Placement Confirmation: positive ETCO2 and breath sounds checked- equal and bilateral Dental Injury: Teeth and Oropharynx as per pre-operative assessment

## 2022-10-18 NOTE — Discharge Instructions (Signed)

## 2022-10-18 NOTE — Progress Notes (Signed)
Per request by Angie RN charge nurse the arterial line was left intact.  BP per art line was 105/ 68.

## 2022-10-18 NOTE — CV Procedure (Signed)
[facility]  TAVR OPERATIVE NOTE   Date of Procedure:  10/18/2022  Preoperative Diagnosis: Severe Aortic Stenosis   Postoperative Diagnosis: Same   Procedure:   Transcatheter Aortic Valve Replacement - Transfemoral Approach  Edwards Sapien 3 THV (size 29 mm, model # [id number], serial # [id number] )   Co-Surgeons:  Lauree Chandler, MD and Gilford Raid , MD   Anesthesiologist:  Glennon Mac  Echocardiographer:  O'Neal  Pre-operative Echo Findings: Severe aortic stenosis Normal left ventricular systolic function  Post-operative Echo Findings: No paravalvular leak Normal left ventricular systolic function  BRIEF CLINICAL NOTE AND INDICATIONS FOR SURGERY  88 yo male with history of atrial fibrillation on coumadin, HTN, thoracic aortic aneurysm and severe aortic stenosis. He has been followed for moderate aortic stenosis. Most recent echo 08/10/22 with LVEF=65-70%. Dr. Audie Box read his echo and felt that he had severe paradoxical low flow/low gradient aortic stenosis. Mean gradient on this echo was 17 mmHg with DI of 0.24, SVI of 25. Mild mitral regurgitation. Dilated aortic root. His mean gradient was 34.5 mmHg by echo in April 2023 with AVA of 0.8 cm2. Moderate CAD by cath with plans for medical management. Pt describes progressive dyspnea on exertion, fatigue and dizziness.   During the course of the patient's preoperative work up they have been evaluated comprehensively by a multidisciplinary team of specialists coordinated through the [facility] in the [place].  They have been demonstrated to suffer from symptomatic severe aortic stenosis as noted above. The patient has been counseled extensively as to the relative risks and benefits of all options for the treatment of severe aortic stenosis including long term medical therapy, conventional surgery for aortic valve replacement, and transcatheter aortic valve  replacement.  The patient has been independently evaluated by Dr. Cyndia Bent with CT surgery and they are felt to be at high risk for conventional surgical aortic valve replacement. The surgeon indicated the patient would be a poor candidate for conventional surgery. Based upon review of all of the patient's preoperative diagnostic tests they are felt to be candidate for transcatheter aortic valve replacement using the transfemoral approach as an alternative to high risk conventional surgery.    Following the decision to proceed with transcatheter aortic valve replacement, a discussion has been held regarding what types of management strategies would be attempted intraoperatively in the event of life-threatening complications, including whether or not the patient would be considered a candidate for the use of cardiopulmonary bypass and/or conversion to open sternotomy for attempted surgical intervention.  The patient has been advised of a variety of complications that might develop peculiar to this approach including but not limited to risks of death, stroke, paravalvular leak, aortic dissection or other major vascular complications, aortic annulus rupture, device embolization, cardiac rupture or perforation, acute myocardial infarction, arrhythmia, heart block or bradycardia requiring permanent pacemaker placement, congestive heart failure, respiratory failure, renal failure, pneumonia, infection, other late complications related to structural valve deterioration or migration, or other complications that might ultimately cause a temporary or permanent loss of functional independence or other long term morbidity.  The patient provides full informed consent for the procedure as described and all questions were answered preoperatively.    DETAILS OF THE OPERATIVE PROCEDURE  PREPARATION:   The patient is brought to the operating room on the above mentioned date and central monitoring was established by the  anesthesia team including placement of a radial arterial line. The patient is placed in the  supine position on the operating table.  Intravenous antibiotics are administered. Conscious sedation is used.   Baseline transthoracic echocardiogram was performed. The patient's chest, abdomen, both groins, and both lower extremities are prepared and draped in a sterile manner. A time out procedure is performed.   PERIPHERAL ACCESS:   Using the modified Seldinger technique, femoral arterial and venous access were obtained with placement of a 6 Fr sheath in the artery and a 7 Fr sheath in the vein on the left side using u/s guidance.  A pigtail diagnostic catheter was passed through the femoral arterial sheath under fluoroscopic guidance into the aortic root.  A temporary transvenous pacemaker catheter was passed through the femoral venous sheath under fluoroscopic guidance into the right ventricle.  The pacemaker was tested to ensure stable lead placement and pacemaker capture. Aortic root angiography was performed in order to determine the optimal angiographic angle for valve deployment.  TRANSFEMORAL ACCESS:  A micropuncture kit was used to gain access to the right femoral artery using u/s guidance. Position confirmed with angiography. Pre-closure with double ProGlide closure devices. The patient was heparinized systemically and ACT verified > 250 seconds.    A 14 Fr transfemoral E-sheath was introduced into the right femoral artery after progressively dilating over an Amplatz superstiff wire. An AL-2 catheter was used to direct a long J wire into the LV. This was exchanged out for a pigtail catheter and position was confirmed in the LV apex. Simultaneous LV and Ao pressures were recorded.  The pigtail catheter was then exchanged for a Safari wire in the LV apex.   TRANSCATHETER HEART VALVE DEPLOYMENT:  An Edwards Sapien 3 THV (size 29 mm) was prepared and crimped per manufacturer's guidelines, and the  proper orientation of the valve is confirmed on the [employer] delivery system. The valve was advanced through the introducer sheath using normal technique until in an appropriate position in the abdominal aorta beyond the sheath tip. The balloon was then retracted and using the fine-tuning wheel was centered on the valve. The valve was then advanced across the aortic arch using appropriate flexion of the catheter. The valve was carefully positioned across the aortic valve annulus. The Commander catheter was retracted using normal technique. Once final position of the valve has been confirmed by angiographic assessment, the valve is deployed while temporarily holding ventilation and during rapid ventricular pacing to maintain systolic blood pressure < 50 mmHg and pulse pressure < 10 mmHg. The balloon inflation is held for >3 seconds after reaching full deployment volume. Once the balloon has fully deflated the balloon is retracted into the ascending aorta and valve function is assessed using TTE. There is felt to be no paravalvular leak and no central aortic insufficiency.  The patient's hemodynamic recovery following valve deployment is good.  The deployment balloon and guidewire are both removed. Echo demostrated acceptable post-procedural gradients, stable mitral valve function, and no AI.   PROCEDURE COMPLETION:  The sheath was then removed and closure devices were completed. Protamine was administered once femoral arterial repair was complete. The temporary pacemaker, pigtail catheters and femoral sheaths were removed with a Mynx closure device placed in the artery and manual pressure used for venous hemostasis.    The patient tolerated the procedure well and is transported to the surgical intensive care in stable condition. There were no immediate intraoperative complications. All sponge instrument and needle counts are verified correct at completion of the operation.   No blood products were  administered during the  operation.  The patient received a total of 40 mL of intravenous contrast during the procedure.  LVEDP: 16 mmHg  Verne Carrow MD, [facility] 10/18/2022 9:38 AM

## 2022-10-19 ENCOUNTER — Encounter (HOSPITAL_COMMUNITY): Payer: Self-pay | Admitting: Cardiovascular Disease

## 2022-10-19 ENCOUNTER — Inpatient Hospital Stay (HOSPITAL_COMMUNITY): Payer: Medicare Other

## 2022-10-19 DIAGNOSIS — Z952 Presence of prosthetic heart valve: Secondary | ICD-10-CM

## 2022-10-19 DIAGNOSIS — I350 Nonrheumatic aortic (valve) stenosis: Secondary | ICD-10-CM

## 2022-10-19 LAB — CBC
HCT: 42.5 % (ref 39.0–52.0)
Hemoglobin: 13.6 g/dL (ref 13.0–17.0)
MCH: 29.7 pg (ref 26.0–34.0)
MCHC: 32 g/dL (ref 30.0–36.0)
MCV: 92.8 fL (ref 80.0–100.0)
Platelets: 111 10*3/uL — ABNORMAL LOW (ref 150–400)
RBC: 4.58 MIL/uL (ref 4.22–5.81)
RDW: 13.2 % (ref 11.5–15.5)
WBC: 9.6 10*3/uL (ref 4.0–10.5)
nRBC: 0 % (ref 0.0–0.2)

## 2022-10-19 LAB — MAGNESIUM: Magnesium: 1.7 mg/dL (ref 1.7–2.4)

## 2022-10-19 LAB — BASIC METABOLIC PANEL
Anion gap: 7 (ref 5–15)
BUN: 14 mg/dL (ref 8–23)
CO2: 24 mmol/L (ref 22–32)
Calcium: 8.3 mg/dL — ABNORMAL LOW (ref 8.9–10.3)
Chloride: 108 mmol/L (ref 98–111)
Creatinine, Ser: 1.08 mg/dL (ref 0.61–1.24)
GFR, Estimated: >60 mL/min (ref >60)
Glucose, Bld: 115 mg/dL — ABNORMAL HIGH (ref 70–99)
Potassium: 4.4 mmol/L (ref 3.5–5.1)
Sodium: 139 mmol/L (ref 135–145)

## 2022-10-19 NOTE — Progress Notes (Signed)
Nursing DC note  Patients daughter [name] verbalized understanding of dc instructions. Ccmd notified of dc order. All belongings given to patient and family. Vss. Denies cp or sob.iv sites unremarkable.

## 2022-10-19 NOTE — Progress Notes (Signed)
  Echocardiogram 2D Echocardiogram has been performed.  Warren Wagner 10/19/2022, 9:29 AM

## 2022-10-19 NOTE — Progress Notes (Signed)
CARDIAC REHAB PHASE I   PRE:  Rate/Rhythm: 85 A-fib  BP:  Sitting: 125/71      SaO2: 97 RA  MODE:  Ambulation: 50 ft   POST:  Rate/Rhythm: 104 A-fib  BP:  Sitting: 137/93      SaO2: 94 RA   Pt resting in bed feeling well today. Pt with mod assist to EOB, min assist to standing. Ambulated in hall maintaining a slow steady gait with front wheel walker. Tolerated fairly with no dizziness or pain, noted mild SOB that resolved with rest.  Returned to bed with call bell and bedside table in reach. Post TAVR education including site care, risk factors, restrictions, heart healthy diet, exercise guidelines and CRP2 reviewed with pt and daughter. All questions and concerns addressed. Pt and daughter agree that he is not interested in CRP2 at this time. Plan for home later today.   [id number] Vanessa Barbara, RN BSN 10/19/2022 10:00 AM

## 2022-10-20 ENCOUNTER — Telehealth: Payer: Self-pay | Admitting: Physician Assistant

## 2022-10-20 ENCOUNTER — Ambulatory Visit: Payer: Medicare Other | Admitting: Cardiology

## 2022-10-20 NOTE — Telephone Encounter (Signed)
  [facility]   MULTIDISCIPLINARY HEART VALVE TEAM   Patient contacted regarding discharge from [facility] on 1/10  Patient understands to follow up with a structural heart APP on 1/19 at [address]  Patient understands discharge instructions? yes Patient understands medications and regimen? yes Patient understands to bring all medications to this visit? yes  Ryett Hamman PA-C  [facility]     

## 2022-10-21 ENCOUNTER — Telehealth: Payer: Self-pay | Admitting: Cardiology

## 2022-10-21 ENCOUNTER — Other Ambulatory Visit: Payer: Self-pay

## 2022-10-21 DIAGNOSIS — I10 Essential (primary) hypertension: Secondary | ICD-10-CM

## 2022-10-21 LAB — ECHOCARDIOGRAM COMPLETE
AR max vel: 3.54 cm2
AV Area VTI: 3.25 cm2
AV Area mean vel: 3.45 cm2
AV Mean grad: 5.6 mmHg
AV Peak grad: 11 mmHg
Ao pk vel: 1.66 m/s
Area-P 1/2: 4.68 cm2
Calc EF: 64.5 %
Height: 72 in
MV M vel: 5.43 m/s
MV Peak grad: 117.9 mmHg
Radius: 0.5 cm
S' Lateral: 3.25 cm
Single Plane A2C EF: 68.9 %
Single Plane A4C EF: 64.3 %
Weight: 3636.64 oz

## 2022-10-21 MED ORDER — METOPROLOL SUCCINATE ER 50 MG PO TB24: 50.0000 mg | ORAL_TABLET | Freq: Every day | ORAL | 0 refills | Status: DC

## 2022-10-21 MED ORDER — FINASTERIDE 5 MG PO TABS: 5.0000 mg | ORAL_TABLET | Freq: Every day | ORAL | 0 refills | Status: DC

## 2022-10-21 NOTE — Telephone Encounter (Signed)
Called pt's daughter. She states her father has been hallucinating since her returned home from the procedure. She states "I don't know what kind of anesthesia he had but he has been hallucinating and having double vision." When asked what was he seeing she replied: "yesterday he was looking at the front door saying it was falling apart, it's not. He said ants were crawling on my sister, they weren't. He also seen a squirrel run across the floor. It has been random weird things." I made her aware I will get this message over to Dr. Angelena Form and his team to get recommendations. She verbalized understanding.

## 2022-10-21 NOTE — Telephone Encounter (Signed)
Daughter calling with questions/concerns for the patient procedure. Please advise

## 2022-10-21 NOTE — Telephone Encounter (Signed)
I responded to her mychart message. Went over ER precautions.

## 2022-10-25 ENCOUNTER — Other Ambulatory Visit: Payer: Self-pay

## 2022-10-25 ENCOUNTER — Ambulatory Visit: Payer: Medicare Other | Attending: Cardiology

## 2022-10-25 DIAGNOSIS — J3089 Other allergic rhinitis: Secondary | ICD-10-CM

## 2022-10-25 DIAGNOSIS — I4891 Unspecified atrial fibrillation: Secondary | ICD-10-CM

## 2022-10-25 DIAGNOSIS — Z5181 Encounter for therapeutic drug level monitoring: Secondary | ICD-10-CM

## 2022-10-25 LAB — POCT INR: INR: 2.6 (ref 2.0–3.0)

## 2022-10-25 MED ORDER — CETIRIZINE HCL 10 MG PO TABS: 10.0000 mg | ORAL_TABLET | Freq: Every day | ORAL | 0 refills | Status: DC

## 2022-10-25 NOTE — Telephone Encounter (Signed)
L.O.V: 04/20/22  N.O.V: 11/29/22  L.R.F: 07/27/22 90 tab 0 refill

## 2022-10-25 NOTE — Patient Instructions (Signed)
Description   Continue taking 1 tablet daily except 0.5 tablet on Monday, Wednesday and Friday.  Stay consistent with greens (2 times per week)  Recheck INR 2 weeks  [facility] [phone]

## 2022-10-28 ENCOUNTER — Ambulatory Visit: Payer: Medicare Other | Attending: Physician Assistant | Admitting: Physician Assistant

## 2022-10-28 VITALS — BP 130/70 | HR 60 | Ht 72.0 in | Wt 232.0 lb

## 2022-10-28 DIAGNOSIS — I10 Essential (primary) hypertension: Secondary | ICD-10-CM

## 2022-10-28 DIAGNOSIS — I482 Chronic atrial fibrillation, unspecified: Secondary | ICD-10-CM | POA: Diagnosis not present

## 2022-10-28 DIAGNOSIS — Z952 Presence of prosthetic heart valve: Secondary | ICD-10-CM | POA: Diagnosis not present

## 2022-10-28 DIAGNOSIS — I7121 Aneurysm of the ascending aorta, without rupture: Secondary | ICD-10-CM | POA: Diagnosis not present

## 2022-10-28 DIAGNOSIS — H539 Unspecified visual disturbance: Secondary | ICD-10-CM

## 2022-10-28 MED ORDER — AMOXICILLIN 500 MG PO TABS: 2000.0000 mg | ORAL_TABLET | ORAL | 12 refills | Status: DC

## 2022-10-28 NOTE — Patient Instructions (Signed)
Medication Instructions:  Your physician recommends that you continue on your current medications as directed. Please refer to the Current Medication list given to you today.  *If you need a refill on your cardiac medications before your next appointment, please call your pharmacy   Follow-Up: At [facility], you and your health needs are our priority.  As part of our continuing mission to provide you with exceptional heart care, we have created designated Provider Care Teams.  These Care Teams include your primary Cardiologist (physician) and Advanced Practice Providers (APPs -  Physician Assistants and Nurse Practitioners) who all work together to provide you with the care you need, when you need it.  Your next appointment:   As scheduled  

## 2022-10-28 NOTE — Progress Notes (Signed)
[facility]                                     Cardiology Office Note:    Date:  10/28/2022   ID:  Warren Leas., DOB [birth year], MRN [id number]  PCP:  Lorrene Reid, PA-C  [facility] Cardiologist:  Minus Breeding, MD / Dr. Angelena Form & Dr. Cyndia Bent (TAVR)  [facility] Electrophysiologist:  None   Referring MD: Lorrene Reid, PA-C   TOC s/p TAVR  History of Present Illness:    Warren Wagner. is a 88 y.o. 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   He has been followed with what was felt to be moderate aortic stenosis. Echo in 01/2022 showed a mean gradient of 34.5 mmHg, a peak gradient of 50.3 mmHg, and a valve area of 0.82 cm. He then presented with a several month history of progressive exertional shortness of breath and fatigue as well as recurrent episodes of dizziness particularly with standing up. He had lower extremity edema that improved with diuretics. He was in the emergency room on 09/26/2022 feeling poorly with generalized weakness and dizziness and could not walk to the bathroom.  He was seen by cardiology and felt to be orthostatic and his Lasix was decreased to 20 mg daily. His most recent echocardiogram on 08/10/2022 showed EF 65%, and severe LFLG AS with mean grad 17.3 mmHg, peak grad 32.2 mmHg, AVA 0.91cm2, DVI 0.24, SVI 25, as well as mild MR. [facility] 09/09/22 showed CTO of the mid LAD, severe disease in the moderate caliber diagonal branch, moderate caliber Circumflex with severe stenosis in the moderate caliber obtuse marginal branch and a large dominant RCA. Dr. Buena Irish could not engage this vessel selectively. The proximal, mid and distal vessel was patent with mild to moderate calcific disease. There were  normal right and left heart pressures. Medical management of CAD was recommended.   The patient was evaluated by the multidisciplinary valve team and underwent successful TAVR with a 29 mm Edwards Sapien 3 Ultra Resilia THV via the TF approach on 10/18/22. Post operative echo  showed EF 60%, moderate concentric LVH, normally functioning TAVR with a mean gradient of 5.6 mmHg and no PVL and moderate MR as well as TAA ~59mm. He was resumed on home coumadin.    Today the patient presents to clinic for follow up. Here with daughter. Doing okay. Hasn't complained of dizziness since TAVR. Does have some positional lightheadedness. No CP or SOB. No LE edema, orthopnea or PND. No dizziness or syncope. No blood in stool or urine. No palpitations. He has been seeing "ants" since the procedure. This seems to be getting better. Daughter also says he has had some hallucinations.    Past Medical History:  Diagnosis Date   Atrial fibrillation, persistent (HCC)    COPD (chronic obstructive pulmonary disease) (HCC)    Dementia (HCC)    Hearing loss    Hypertension    Prostate enlargement    S/P TAVR (transcatheter aortic valve replacement) 10/18/2022   s/p TAVR with a 29 mm Edwards S3UR via the TF approach by Dr. Angelena Form & Dr. Cyndia Bent   Severe aortic stenosis    Umbilical hernia  07/27/2012    Past Surgical History:  Procedure Laterality Date   CARDIOVERSION  10/10/2006   cataracts Bilateral    HERNIA REPAIR  26/71/2458   large umbilical hernia   INSERTION OF MESH  09/10/2012   Procedure: INSERTION OF MESH;  Surgeon: Zenovia Jarred, MD;  Location: [facility];  Service: General;  Laterality: N/A;   INTRAOPERATIVE TRANSTHORACIC ECHOCARDIOGRAM N/A 10/18/2022   Procedure: INTRAOPERATIVE TRANSTHORACIC ECHOCARDIOGRAM;  Surgeon: Burnell Blanks, MD;  Location: [place] [facility];  Service: Open Heart Surgery;  Laterality: N/A;   macular degeneration Bilateral    RIGHT HEART CATH AND CORONARY ANGIOGRAPHY N/A  09/09/2022   Procedure: RIGHT HEART CATH AND CORONARY ANGIOGRAPHY;  Surgeon: Burnell Blanks, MD;  Location: [place] [facility];  Service: Cardiovascular;  Laterality: N/A;   TONSILLECTOMY     "maybe" (09/10/2012)   TRANSCATHETER AORTIC VALVE REPLACEMENT, TRANSFEMORAL N/A 10/18/2022   Procedure: Transcatheter Aortic Valve Replacement, Transfemoral;  Surgeon: Burnell Blanks, MD;  Location: [place] [facility];  Service: Open Heart Surgery;  Laterality: N/A;   UMBILICAL HERNIA REPAIR  09/10/2012   Procedure: HERNIA REPAIR UMBILICAL ADULT;  Surgeon: Zenovia Jarred, MD;  Location: [place];  Service: General;  Laterality: N/A;    Current Medications: Current Meds  Medication Sig   acetaminophen (TYLENOL) 500 MG tablet Take 1,000 mg by mouth every 6 (six) hours as needed for moderate pain or headache.   amoxicillin (AMOXIL) 500 MG tablet Take 4 tablets (2,000 mg total) by mouth as directed. 1 hour prior to dental work including cleanings   cetirizine (ZYRTEC) 10 MG tablet Take 1 tablet (10 mg total) by mouth daily.   docusate sodium (COLACE) 100 MG capsule Take 100 mg by mouth daily.   finasteride (PROSCAR) 5 MG tablet Take 1 tablet (5 mg total) by mouth daily.   Fluticasone-Umeclidin-Vilant (TRELEGY ELLIPTA) 100-62.5-25 MCG/ACT AEPB INHALE 1 PUFF BY MOUTH ONCE DAILY . APPOINTMENT REQUIRED FOR FUTURE REFILLS   furosemide (LASIX) 40 MG tablet Take 1 tablet (40 mg total) by mouth daily. (Patient taking differently: Take 20-40 mg by mouth See admin instructions. Taking 20 mg daily except on a day when weight gain is noticed can take 40 mg daily)   memantine (NAMENDA) 10 MG tablet Take 1 tablet (10 mg total) by mouth 2 (two) times daily.   metoprolol succinate (TOPROL-XL) 50 MG 24 hr tablet Take 1 tablet (50 mg total) by mouth daily. Take with or immediately following a meal.   Multiple Vitamins-Minerals (MULTIVITAMIN WITH MINERALS) tablet Take 1 tablet by mouth daily.   Multiple  Vitamins-Minerals (OCUVITE PO) Take 1 tablet by mouth daily.   OMEGA-3 FATTY ACIDS PO Take 500 mg by mouth daily.   sodium chloride (OCEAN) 0.65 % SOLN nasal spray Place 1 spray into both nostrils daily as needed for congestion.   tobramycin-dexamethasone (TOBRADEX) ophthalmic solution Place 1 drop into the right eye QID.   warfarin (COUMADIN) 5 MG tablet TAKE 1/2 TO 1 TABLET BY MOUTH ONCE DAILY AS DIRECTED BY ANTICOAGULATION CLINIC. (Patient taking differently: Take 2.5-5 mg by mouth See admin instructions. Take 2.5 mg on Monday,Wednesday and Friday all the other days take 5 mg in the morning DAILY AS DIRECTED BY ANTICOAGULATION CLINIC.)     Allergies:   Patient has no known allergies.   Social History   Socioeconomic History   Marital status: Widowed    Spouse name: Not on file   Number of children: 3   Years  of education: Not on file   Highest education level: Not on file  Occupational History   Occupation: [employer] doctor  Tobacco Use   Smoking status: Former    Packs/day: 1.50    Years: 10.00    Total pack years: 15.00    Types: Cigarettes    Quit date: 07/27/1982    Years since quitting: 40.2   Smokeless tobacco: Never  Vaping Use   Vaping Use: Never used  Substance and Sexual Activity   Alcohol use: No   Drug use: No   Sexual activity: Not Currently    Birth control/protection: None  Other Topics Concern   Not on file  Social History Narrative   Lives with 2 daughters. Three children.    Social Determinants of Health   Financial Resource Strain: Not on file  Food Insecurity: Not on file  Transportation Needs: Not on file  Physical Activity: Not on file  Stress: Not on file  Social Connections: Not on file     Family History: The patient's family history includes Heart disease in his mother; Stroke in his father.  ROS:   Please see the history of present illness.    All other systems reviewed and are negative.  EKGs/Labs/Other Studies Reviewed:    The  following studies were reviewed today:   TAVR OPERATIVE NOTE     Date of Procedure:                10/18/2022   Preoperative Diagnosis:      Severe Aortic Stenosis    Postoperative Diagnosis:    Same    Procedure:        Transcatheter Aortic Valve Replacement - Transfemoral Approach             Edwards Sapien 3 THV (size 29 mm, model # [id number], serial # [id number] )              Co-Surgeons:                        Verne Carrow, MD and Evelene Croon , MD    Anesthesiologist:                  Jean Rosenthal   Echocardiographer:              O'Neal   Pre-operative Echo Findings: Severe aortic stenosis Normal left ventricular systolic function   Post-operative Echo Findings: No paravalvular leak Normal left ventricular systolic function   _____________     Echo 10/19/22: IMPRESSIONS  1. The aortic valve has been repaired/replaced. Aortic valve  regurgitation is not visualized. There is a 29 mm Sapien prosthetic (TAVR)  valve present in the aortic position. Procedure Date: 10/18/2022. Echo  findings are consistent with normal structure  and function of the aortic valve prosthesis. Aortic valve area, by VTI  measures 3.25 cm. Aortic valve mean gradient measures 5.6 mmHg. Aortic  valve Vmax measures 1.66 m/s.   2. Left ventricular ejection fraction, by estimation, is 60 to 65%. The  left ventricle has normal function. The left ventricle has no regional  wall motion abnormalities. There is moderate concentric left ventricular  hypertrophy. Left ventricular  diastolic function could not be evaluated.   3. Right ventricular systolic function is normal. The right ventricular  size is mildly enlarged. There is normal pulmonary artery systolic  pressure. The estimated right ventricular systolic pressure is 24.2 mmHg.   4. Left atrial size was severely dilated.  5. Right atrial size was mildly dilated.   6. The mitral valve is grossly normal. Moderate mitral valve  regurgitation.  No evidence of mitral stenosis.   7. Aortic dilatation noted. Aneurysm of the ascending aorta, measuring 45  mm. There is mild dilatation of the aortic root, measuring 41 mm.   8. The inferior vena cava is dilated in size with >50% respiratory  variability, suggesting right atrial pressure of 8 mmHg.    EKG:  EKG is  ordered today.  The ekg ordered today demonstrates afib, HR 60  Recent Labs: 02/03/2022: TSH 1.860 09/26/2022: B Natriuretic Peptide 90.1 10/14/2022: ALT 27 10/19/2022: BUN 14; Creatinine, Ser 1.08; Hemoglobin 13.6; Magnesium 1.7; Platelets 111; Potassium 4.4; Sodium 139  Recent Lipid Panel    Component Value Date/Time   CHOL 218 (H) 05/04/2022 0840   TRIG 72 05/04/2022 0840   HDL 58 05/04/2022 0840   CHOLHDL 3.8 05/04/2022 0840   LDLCALC 147 (H) 05/04/2022 0840     Risk Assessment/Calculations:     Physical Exam:    VS:  BP 130/70   Pulse 60   Ht 6' (1.829 m)   Wt 232 lb (105.2 kg)   SpO2 95%   BMI 31.46 kg/m     Wt Readings from Last 3 Encounters:  10/28/22 232 lb (105.2 kg)  10/19/22 227 lb 4.6 oz (103.1 kg)  09/28/22 240 lb (108.9 kg)     GEN:  Well nourished, well developed in no acute distress HEENT: Normal NECK: No JVD LYMPHATICS: No lymphadenopathy CARDIAC: irreg irreg, no murmurs, rubs, gallops RESPIRATORY:  Clear to auscultation without rales, wheezing or rhonchi  ABDOMEN: Soft, non-tender, non-distended MUSCULOSKELETAL:  No edema; No deformity  SKIN: Warm and dry.  Groin sites clear without hematoma. Diffuse  ecchymosis noted, L>R NEUROLOGIC:  Alert and oriented x 3 PSYCHIATRIC:  Normal affect   ASSESSMENT:    1. S/P TAVR (transcatheter aortic valve replacement)   2. Chronic atrial fibrillation (HCC)   3. Essential hypertension   4. Aneurysm of ascending aorta without rupture (HCC)   5. Visual disturbances    PLAN:    In order of problems listed above:  Severe AS s/p TAVR: doing fine 1 week out from TAVR. ECG with no HAVB. Groin  sites stable. Continue coumadin. SBE prophylaxis discussed; I have RX'd amoxicillin. We will see him back in 1 month with echo and OV.    Permanent afib: rates have been stable. He has an INR therapeutic last check. This is followed in [place].    HTN: BP stable. Resume home meds.    TAA: pre TAVR CT showed "stable mild dilation of the ascending thoracic aorta, measuring up to 4.3 cm. Recommend annual imaging followup by CTA or MRA." This has been known and followed. Per review of previous [facility] notes, "Given his frailty we will manage this conservatively.   Visual disturbance/hallucinations: this is likely related to anesthesia vs embolic events from TAVR. We do see this from time and time and it usually gets better. Will continue to monitor.  Medication Adjustments/Labs and Tests Ordered: Current medicines are reviewed at length with the patient today.  Concerns regarding medicines are outlined above.  Orders Placed This Encounter  Procedures   EKG 12-Lead   Meds ordered this encounter  Medications   amoxicillin (AMOXIL) 500 MG tablet    Sig: Take 4 tablets (2,000 mg total) by mouth as directed. 1 hour prior to dental work including cleanings    Dispense:  12 tablet    Refill:  12    Order Specific Question:   Supervising Provider    Answer:   Tonny Bollman [[id number]]    Patient Instructions  Medication Instructions:  Your physician recommends that you continue on your current medications as directed. Please refer to the Current Medication list given to you today.  *If you need a refill on your cardiac medications before your next appointment, please call your pharmacy*   Follow-Up: At [facility], you and your health needs are our priority.  As part of our continuing mission to provide you with exceptional heart care, we have created designated Provider Care Teams.  These Care Teams include your primary Cardiologist (physician) and Advanced Practice Providers (APPs -   Physician Assistants and Nurse Practitioners) who all work together to provide you with the care you need, when you need it.   Your next appointment:   As scheduled     Signed, Cline Crock, PA-C  10/28/2022 10:06 AM    [facility]

## 2022-11-08 ENCOUNTER — Ambulatory Visit: Payer: Medicare Other | Attending: Cardiology

## 2022-11-08 DIAGNOSIS — Z5181 Encounter for therapeutic drug level monitoring: Secondary | ICD-10-CM | POA: Diagnosis not present

## 2022-11-08 DIAGNOSIS — Z7901 Long term (current) use of anticoagulants: Secondary | ICD-10-CM

## 2022-11-08 DIAGNOSIS — I4891 Unspecified atrial fibrillation: Secondary | ICD-10-CM

## 2022-11-08 DIAGNOSIS — I4821 Permanent atrial fibrillation: Secondary | ICD-10-CM

## 2022-11-08 LAB — POCT INR: INR: 3.5 — AB (ref 2.0–3.0)

## 2022-11-08 NOTE — Patient Instructions (Signed)
Description   Only take 1/2 tablet today and then continue taking 1 tablet daily except 0.5 tablet on Monday, Wednesday and Friday.  Stay consistent with greens (2 times per week)  Recheck INR 3 weeks  Coumadin Clinic [phone]

## 2022-11-10 NOTE — Progress Notes (Signed)
[facility] - Clinic Note  11/16/2022     CHIEF COMPLAINT Patient presents for Retina Evaluation   HISTORY OF PRESENT ILLNESS: Warren Wagner. is a 88 y.o. male who presents to the clinic today for:   HPI     Retina Evaluation   In both eyes.  This started 3.5 years ago.  Duration of 10 months.  Associated Symptoms Floaters.  I, the attending physician,  performed the HPI with the patient and updated documentation appropriately.        Comments   New pt here for ret eval, prev pt of Dr. Barbaraann Barthel. 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 Unsure of the last date of patients last treatment. Its been since at least May 2021. Pt has afib, on Coumadin. Recently had heart valve replacement in Jan 2024. Pt does have dementia Alzheimer's as well. Pt does report seeing floaters but no wavy lines. No FOL. Pt has rx specs but doesn't feel he can see in them very well-doesn't wear them much. Pt has had cataract sx OU w/ Dr. Nile Riggs about 4-5 years ago.       Last edited by Rennis Chris, MD on 11/17/2022 10:24 AM.    Pt is a previous Dr. Luciana Axe pt here due to insurance reasons, pt received injections OS with Dr. Luciana Axe, but has not had one since 2021, pt also sees Dr. Nile Riggs for regular exams and had cataract sx with him 4-5 years ago  Referring physician: Mayer Masker, PA-C No address on file  HISTORICAL INFORMATION:   Selected notes from the MEDICAL RECORD NUMBER [name] transferring care due to insurance LEE: 03.27.23 [BCVA 20/40 OD, CF OS] Ocular Hx- ex ARMD OU; last injection was IVA OS on 05.24.21  PMH-    CURRENT MEDICATIONS: Current Outpatient Medications (Ophthalmic Drugs)  Medication Sig   tobramycin-dexamethasone (TOBRADEX) ophthalmic solution Place 1 drop into the right eye QID.   No current facility-administered medications for this visit. (Ophthalmic Drugs)   Current Outpatient Medications (Other)  Medication Sig   acetaminophen  (TYLENOL) 500 MG tablet Take 1,000 mg by mouth every 6 (six) hours as needed for moderate pain or headache.   amoxicillin (AMOXIL) 500 MG tablet Take 4 tablets (2,000 mg total) by mouth as directed. 1 hour prior to dental work including cleanings   cetirizine (ZYRTEC) 10 MG tablet Take 1 tablet (10 mg total) by mouth daily.   docusate sodium (COLACE) 100 MG capsule Take 100 mg by mouth daily.   finasteride (PROSCAR) 5 MG tablet Take 1 tablet (5 mg total) by mouth daily.   Fluticasone-Umeclidin-Vilant (TRELEGY ELLIPTA) 100-62.5-25 MCG/ACT AEPB INHALE 1 PUFF BY MOUTH ONCE DAILY . APPOINTMENT REQUIRED FOR FUTURE REFILLS   furosemide (LASIX) 40 MG tablet Take 1 tablet (40 mg total) by mouth daily. (Patient taking differently: Take 20-40 mg by mouth See admin instructions. Taking 20 mg daily except on a day when weight gain is noticed can take 40 mg daily)   memantine (NAMENDA) 10 MG tablet Take 1 tablet (10 mg total) by mouth 2 (two) times daily.   metoprolol succinate (TOPROL-XL) 50 MG 24 hr tablet Take 1 tablet (50 mg total) by mouth daily. Take with or immediately following a meal.   Multiple Vitamins-Minerals (MULTIVITAMIN WITH MINERALS) tablet Take 1 tablet by mouth daily.   Multiple Vitamins-Minerals (OCUVITE PO) Take 1 tablet by mouth daily.   OMEGA-3 FATTY ACIDS PO Take 500 mg by mouth  daily.   sodium chloride (OCEAN) 0.65 % SOLN nasal spray Place 1 spray into both nostrils daily as needed for congestion.   warfarin (COUMADIN) 5 MG tablet TAKE 1/2 TO 1 TABLET BY MOUTH ONCE DAILY AS DIRECTED BY ANTICOAGULATION CLINIC. (Patient taking differently: Take 2.5-5 mg by mouth See admin instructions. Take 2.5 mg on Monday,Wednesday and Friday all the other days take 5 mg in the morning DAILY AS [place].)   No current facility-administered medications for this visit. (Other)   REVIEW OF SYSTEMS: ROS   Positive for: Cardiovascular, Eyes Negative for: Constitutional,  Gastrointestinal, Neurological, Skin, Genitourinary, Musculoskeletal, HENT, Endocrine, Respiratory, Psychiatric, Allergic/Imm, Heme/Lymph Last edited by Kingsley Spittle, COT on 11/16/2022  8:11 AM.     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 Angelena Form & Dr. Cyndia Bent   Severe aortic stenosis    Umbilical hernia 26/94/8546   Past Surgical History:  Procedure Laterality Date   CARDIOVERSION  10/10/2006   cataracts Bilateral    HERNIA REPAIR  27/12/5007   large umbilical hernia   INSERTION OF MESH  09/10/2012   Procedure: INSERTION OF MESH;  Surgeon: Zenovia Jarred, MD;  Location: [place];  Service: General;  Laterality: N/A;   INTRAOPERATIVE TRANSTHORACIC ECHOCARDIOGRAM N/A 10/18/2022   Procedure: INTRAOPERATIVE TRANSTHORACIC ECHOCARDIOGRAM;  Surgeon: Burnell Blanks, MD;  Location: [place] [facility];  Service: Open Heart Surgery;  Laterality: N/A;   macular degeneration Bilateral    RIGHT HEART CATH AND CORONARY ANGIOGRAPHY N/A 09/09/2022   Procedure: RIGHT HEART CATH AND CORONARY ANGIOGRAPHY;  Surgeon: Burnell Blanks, MD;  Location: [facility];  Service: Cardiovascular;  Laterality: N/A;   TONSILLECTOMY     "maybe" (09/10/2012)   TRANSCATHETER AORTIC VALVE REPLACEMENT, TRANSFEMORAL N/A 10/18/2022   Procedure: Transcatheter Aortic Valve Replacement, Transfemoral;  Surgeon: Burnell Blanks, MD;  Location: [place];  Service: Open Heart Surgery;  Laterality: N/A;   UMBILICAL HERNIA REPAIR  09/10/2012   Procedure: HERNIA REPAIR UMBILICAL ADULT;  Surgeon: Zenovia Jarred, MD;  Location: [place];  Service:  General;  Laterality: N/A;   FAMILY HISTORY Family History  Problem Relation Age of Onset   Heart disease Mother    Stroke Father    SOCIAL HISTORY Social History   Tobacco Use   Smoking status: Former    Packs/day: 1.50    Years: 10.00    Total pack years: 15.00    Types: Cigarettes    Quit date: 07/27/1982    Years since quitting: 40.3   Smokeless tobacco: Never  Vaping Use   Vaping Use: Never used  Substance Use Topics   Alcohol use: No   Drug use: No       OPHTHALMIC EXAM:  Base Eye Exam     Visual Acuity (Snellen - Linear)       Right Left   Dist cc 20/100 -2 CF at 2'   Dist ph cc 20/80 -1     Correction: Glasses         Tonometry (Tonopen, 8:27 AM)  Right Left   Pressure 16 13         Pupils       Dark Light Shape React APD   Right 2 1 Round Minimal None   Left 2 2 Round NR None         Visual Fields (Counting fingers)       Left Right    Full Full         Extraocular Movement       Right Left    Full, Ortho Full, Ortho         Neuro/Psych     Oriented x3: Yes   Mood/Affect: Normal         Dilation     Both eyes: 1.0% Mydriacyl, 2.5% Phenylephrine @ 8:28 AM           Slit Lamp and Fundus Exam     External Exam       Right Left   External Normal Normal         Slit Lamp Exam       Right Left   Lids/Lashes Dermatochalasis - upper lid Dermatochalasis - upper lid, mild MGD   Conjunctiva/Sclera White and quiet White and quiet   Cornea 1-2+ Punctate epithelial erosions, mild arcus, well healed cataract wound, tear film debris 3+ Punctate epithelial erosions, tear film debris, well healed cataract wound   Anterior Chamber deep and clear deep and clear   Iris Round and dilated round and poorly dilated   Lens PC IOL in good position, 1+ Posterior capsular opacification PC IOL in good position, 1+ Posterior capsular opacification   Anterior Vitreous Vitreous syneresis Vitreous syneresis          Fundus Exam       Right Left   Posterior Vitreous Normal Posterior vitreous detachment, vitreous condensations   Disc 2+Pallor, Sharp rim 2+Pallor, Sharp rim   C/D Ratio 0.3 0.3   Macula Blunted foveal reflex, refractile Drusen, RPE mottling, clumping and atrophy, focal central edema, no frank heme Flat, blunted foveal reflex, refractile Drusen, RPE mottling, clumping and atrophy, central GA, no frank heme   Vessels attenuated, mild tortuosity attenuated, mild tortuosity   Periphery Attached, reticular degeneration, mild heme Attached, reticular degeneration, mild heme           Refraction     Wearing Rx       Sphere Cylinder Axis Add   Right -0.75 +0.75 087 +2.75   Left -0.75 +75.00 083 +2.75  Rx specs are 12-44 years old (from s/p cataract sx)        Manifest Refraction       Sphere Cylinder Axis Dist VA   Right -1.75 +0.75 098 20/70   Left +/-5.00   20/NI           IMAGING AND PROCEDURES  Imaging and Procedures for 11/16/2022  OCT, Retina - OU - Both Eyes       Right Eye Quality was good. Scan locations included subfoveal. Central Foveal Thickness: 238. Progression has no prior data. Findings include normal foveal contour, no IRF, retinal drusen , pigment epithelial detachment, subretinal fluid, outer retinal atrophy (Patchy central ORA with PEDs and central pocket of SRF).   Left Eye Quality was good. Scan locations included subfoveal. Central Foveal Thickness: 231. Progression has no prior data. Findings include no IRF, no SRF, abnormal foveal contour, retinal drusen , outer retinal tubulation, pigment epithelial detachment, outer retinal atrophy (Central ORA / GA with PEDs and  ORT).   Notes *Images captured and stored on drive  Diagnosis / Impression:  OD: Patchy central ORA with PEDs and central pocket of SRF OS: Central ORA / GA with PEDs and ORT  Clinical management:  See below  Abbreviations: NFP - Normal foveal profile. CME - cystoid macular edema.  PED - pigment epithelial detachment. IRF - intraretinal fluid. SRF - subretinal fluid. EZ - ellipsoid zone. ERM - epiretinal membrane. ORA - outer retinal atrophy. ORT - outer retinal tubulation. SRHM - subretinal hyper-reflective material. IRHM - intraretinal hyper-reflective material      Intravitreal Injection, Pharmacologic Agent - OD - Right Eye       Time Out 11/16/2022. 9:11 AM. Confirmed correct patient, procedure, site, and patient consented.   Anesthesia Topical anesthesia was used. Anesthetic medications included Lidocaine 2%, Proparacaine 0.5%.   Procedure Preparation included 5% betadine to ocular surface, eyelid speculum. A supplied needle was used.   Injection: 1.25 mg Bevacizumab 1.25mg /0.65ml   Route: Intravitreal, Site: Right Eye   NDC: P3213405, Lot: [id number] , Expiration date: 12/23/2022   Post-op Post injection exam found visual acuity of at least counting fingers. The patient tolerated the procedure well. There were no complications. The patient received written and verbal post procedure care education.            ASSESSMENT/PLAN:    ICD-10-CM   1. Exudative age-related macular degeneration of right eye with active choroidal neovascularization (HCC)  H35.3211 OCT, Retina - OU - Both Eyes    Intravitreal Injection, Pharmacologic Agent - OD - Right Eye    Bevacizumab (AVASTIN) SOLN 1.25 mg    2. 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 Essential hypertension  I10     4. Hypertensive retinopathy of both eyes  H35.033     5. Pseudophakia, both eyes  Z96.1      Exudative age related macular degeneration, OD  - previous Dr. 12-06-1998 pt here due to insurance  - pt reports history of injections OD w/ [name], but unable to see record of that  - The incidence pathology and anatomy of wet AMD discussed   - discussed treatment options including observation vs intravitreal  anti-VEGF agents such as Avastin, Lucentis, Eylea.    - Risks of endophthalmitis and vascular occlusive events and atrophic changes discussed with patient  - BCVA OD today 20/80 -- decreased from last [name] visit on 03.27.23 (20/40)  - OCT OD: Patchy central ORA with PEDs and central pocket of SRF   - recommend IVA OD #1 today, 02.07.24 - pt wishes to proceed with injection - RBA of procedure discussed, questions answered - IVA informed consent obtained and signed, 02.07.24 (OD) - see procedure note  - f/u in 5 wks -- DFE/OCT, possible injection  2. Age related macular degeneration, non-exudative, left eye  - advanced stage w/ significant central GA  - BCVA CF 2' -- stable from last [name] visit on 03.27.23  - history of IVA OS w/ [name], last 05.24.21  - OCT OS shows Central ORA / GA with PEDs and ORT  - The incidence, anatomy, and pathology of dry AMD, risk of progression, and the AREDS and AREDS 2 study including smoking risks discussed with patient.  - Recommend amsler grid monitoring  - monitor  3,4. Hypertensive retinopathy OU - discussed importance of tight BP control - monitor  5. Pseudophakia OU  - s/p CE/IOL (Dr. 05.26.21)  - IOL in  good position, doing well  - monitor  Ophthalmic Meds Ordered this visit:  Meds ordered this encounter  Medications   Bevacizumab (AVASTIN) SOLN 1.25 mg     Return in about 5 weeks (around 12/21/2022) for f/u exu ARMD OD, DFE, OCT.  There are no Patient Instructions on file for this visit.   Explained the diagnoses, plan, and follow up with the patient and they expressed understanding.  Patient expressed understanding of the importance of proper follow up care.   This document serves as a record of services personally performed by Gardiner Sleeper, MD, PhD. It was created on their behalf by Orvan Falconer, an ophthalmic technician. The creation of this record is the provider's dictation and/or activities during the visit.     Electronically signed by: Orvan Falconer, OA, 11/17/22  10:24 AM  This document serves as a record of services personally performed by Gardiner Sleeper, MD, PhD. It was created on their behalf by San Jetty. Owens Shark, OA an ophthalmic technician. The creation of this record is the provider's dictation and/or activities during the visit.    Electronically signed by: San Jetty. Owens Shark, [place] 02.07.2024 10:24 AM  Gardiner Sleeper, M.D., Ph.D. Diseases & Surgery of the Retina and Vitreous [facility]  I have reviewed the above documentation for accuracy and completeness, and I agree with the above. Gardiner Sleeper, M.D., Ph.D. 11/17/22 10:31 AM  Abbreviations: M myopia (nearsighted); A astigmatism; H hyperopia (farsighted); P presbyopia; Mrx spectacle prescription;  CTL contact lenses; OD right eye; OS left eye; OU both eyes  XT exotropia; ET esotropia; PEK punctate epithelial keratitis; PEE punctate epithelial erosions; DES dry eye syndrome; MGD meibomian gland dysfunction; ATs artificial tears; PFAT's preservative free artificial tears; [place] nuclear sclerotic cataract; PSC posterior subcapsular cataract; ERM epi-retinal membrane; PVD posterior vitreous detachment; RD retinal detachment; DM diabetes mellitus; DR diabetic retinopathy; NPDR non-proliferative diabetic retinopathy; PDR proliferative diabetic retinopathy; CSME clinically significant macular edema; DME diabetic macular edema; dbh dot blot hemorrhages; CWS cotton wool spot; POAG primary open angle glaucoma; C/D cup-to-disc ratio; HVF humphrey visual field; GVF goldmann visual field; OCT optical coherence tomography; IOP intraocular pressure; BRVO Branch retinal vein occlusion; CRVO central retinal vein occlusion; CRAO central retinal artery occlusion; BRAO branch retinal artery occlusion; RT retinal tear; SB scleral buckle; PPV pars plana vitrectomy; VH Vitreous hemorrhage; PRP panretinal laser photocoagulation; IVK intravitreal  kenalog; VMT vitreomacular traction; MH Macular hole;  NVD neovascularization of the disc; NVE neovascularization elsewhere; AREDS age related eye disease study; ARMD age related macular degeneration; POAG primary open angle glaucoma; EBMD epithelial/anterior basement membrane dystrophy; ACIOL anterior chamber intraocular lens; IOL intraocular lens; PCIOL posterior chamber intraocular lens; Phaco/IOL phacoemulsification with intraocular lens placement; [place] photorefractive keratectomy; LASIK laser assisted in situ keratomileusis; HTN hypertension; DM diabetes mellitus; COPD chronic obstructive pulmonary disease

## 2022-11-14 ENCOUNTER — Other Ambulatory Visit: Payer: Self-pay | Admitting: Nurse Practitioner

## 2022-11-14 DIAGNOSIS — J449 Chronic obstructive pulmonary disease, unspecified: Secondary | ICD-10-CM

## 2022-11-14 NOTE — Telephone Encounter (Signed)
L.O.V: 04/20/22  N.O.V: 11/29/22  L.R.F: Fluticasone- Umeclidin-Vilant 60 each 0 refill    Denied. Refill requested too soon.

## 2022-11-16 ENCOUNTER — Ambulatory Visit (INDEPENDENT_AMBULATORY_CARE_PROVIDER_SITE_OTHER): Payer: Medicare Other | Admitting: Ophthalmology

## 2022-11-16 ENCOUNTER — Encounter (INDEPENDENT_AMBULATORY_CARE_PROVIDER_SITE_OTHER): Payer: Self-pay | Admitting: Ophthalmology

## 2022-11-16 DIAGNOSIS — H353124 Nonexudative age-related macular degeneration, left eye, advanced atrophic with subfoveal involvement: Secondary | ICD-10-CM | POA: Diagnosis not present

## 2022-11-16 DIAGNOSIS — H353211 Exudative age-related macular degeneration, right eye, with active choroidal neovascularization: Secondary | ICD-10-CM | POA: Diagnosis not present

## 2022-11-16 DIAGNOSIS — I10 Essential (primary) hypertension: Secondary | ICD-10-CM

## 2022-11-16 DIAGNOSIS — Z961 Presence of intraocular lens: Secondary | ICD-10-CM

## 2022-11-16 DIAGNOSIS — H35033 Hypertensive retinopathy, bilateral: Secondary | ICD-10-CM | POA: Diagnosis not present

## 2022-11-16 DIAGNOSIS — H353222 Exudative age-related macular degeneration, left eye, with inactive choroidal neovascularization: Secondary | ICD-10-CM

## 2022-11-16 MED ORDER — BEVACIZUMAB CHEMO INJECTION 1.25MG/0.05ML SYRINGE FOR KALEIDOSCOPE
1.2500 mg | INTRAVITREAL | Status: AC | PRN
  Administered 2022-11-16: 1.25 mg via INTRAVITREAL

## 2022-11-19 NOTE — Progress Notes (Unsigned)
[facility]                                     Cardiology Office Note:    Date:  11/21/2022   ID:  Warren Wagner., DOB May 18, 1935, MRN [id number]  PCP:  Lorrene Reid, PA-C  [facility] Cardiologist:  Minus Breeding, MD / Dr. Angelena Form, MD and Dr. Cyndia Bent, MD (TAVR) [facility] Electrophysiologist:  None   Referring MD: Lorrene Reid, PA-C   Chief Complaint  Patient presents with   Follow-up    1 month s/p TAVR   History of Present Illness:    Warren Wagner. is a 88 y.o. 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    He has been followed with what was felt to be moderate aortic stenosis. Echo in 01/2022 showed a mean gradient of 34.5 mmHg, a peak gradient of 50.3 mmHg, and a valve area of 0.82 cm. He then presented with a several month history of progressive exertional shortness of breath and fatigue as well as recurrent episodes of dizziness particularly with standing up. He had lower extremity edema that improved with diuretics. He was in the emergency room on 09/26/2022 feeling poorly with generalized weakness and dizziness and could not walk to the bathroom.  He was seen by cardiology and felt to be orthostatic and his Lasix was decreased to 20 mg daily. His most recent echocardiogram on 08/10/2022 showed EF 65%, and severe LFLG AS with mean grad 17.3 mmHg, peak grad 32.2 mmHg, AVA 0.91cm2, DVI 0.24, SVI 25, as well as mild MR. [facility] 09/09/22 showed CTO of the mid LAD, severe disease in the moderate caliber diagonal branch, moderate caliber Circumflex with severe stenosis in the moderate caliber obtuse marginal branch and a large dominant RCA. Dr. Buena Irish could not engage this vessel selectively. The proximal, mid and distal vessel was patent with mild to moderate  calcific disease. There were normal right and left heart pressures. Medical management of CAD was recommended.   The patient was evaluated by the multidisciplinary valve team and underwent successful TAVR with a 29 mm Edwards Sapien 3 Ultra Resilia THV via the TF approach on 10/18/22. Post operative echo  showed EF 60%, moderate concentric LVH, normally functioning TAVR with a mean gradient of 5.6 mmHg and no PVL and moderate MR as well as TAA ~26m. He was resumed on home coumadin.     On TOC follow up he was doing ok but had c/o of hallucinations. Today he states that hallucinations have continued. He sees "ants" and "bugs," mostly occurring during daytime hours. Follows with neurology for dementia however has not been seen since 2023. States that he is rather sedentary and can therefore not tell much of a change in his SOB. He denies chest pain, palpitations, bleeding in stool or urine, dizziness, or syncope. He has some bilateral feet edema controlled with Lasix.    Past Medical History:  Diagnosis Date   Atrial fibrillation, persistent (HCC)    COPD (chronic obstructive pulmonary disease) (HCC)    Dementia (HCC)    Hearing loss    Hypertension    Prostate enlargement    S/P TAVR (transcatheter aortic valve replacement) 10/18/2022   s/p TAVR with a  29 mm Edwards S3UR via the TF approach by Dr. Angelena Form & Dr. Cyndia Bent   Severe aortic stenosis    Umbilical hernia [id number]    Past Surgical History:  Procedure Laterality Date   CARDIOVERSION  10/10/2006   cataracts Bilateral    HERNIA REPAIR  [id number]   large umbilical hernia   INSERTION OF MESH  09/10/2012   Procedure: INSERTION OF MESH;  Surgeon: Zenovia Jarred, MD;  Location: [place];  Service: General;  Laterality: N/A;   INTRAOPERATIVE TRANSTHORACIC ECHOCARDIOGRAM N/A 10/18/2022   Procedure: INTRAOPERATIVE TRANSTHORACIC ECHOCARDIOGRAM;  Surgeon: Burnell Blanks, MD;  Location: [place] [facility];  Service: Open Heart Surgery;   Laterality: N/A;   macular degeneration Bilateral    RIGHT HEART CATH AND CORONARY ANGIOGRAPHY N/A 09/09/2022   Procedure: RIGHT HEART CATH AND CORONARY ANGIOGRAPHY;  Surgeon: Burnell Blanks, MD;  Location: [place] [facility];  Service: Cardiovascular;  Laterality: N/A;   TONSILLECTOMY     "maybe" (09/10/2012)   TRANSCATHETER AORTIC VALVE REPLACEMENT, TRANSFEMORAL N/A 10/18/2022   Procedure: Transcatheter Aortic Valve Replacement, Transfemoral;  Surgeon: Burnell Blanks, MD;  Location: [place] [facility];  Service: Open Heart Surgery;  Laterality: N/A;   UMBILICAL HERNIA REPAIR  09/10/2012   Procedure: HERNIA REPAIR UMBILICAL ADULT;  Surgeon: Zenovia Jarred, MD;  Location: [place];  Service: General;  Laterality: N/A;    Current Medications: Current Meds  Medication Sig   acetaminophen (TYLENOL) 500 MG tablet Take 1,000 mg by mouth every 6 (six) hours as needed for moderate pain or headache.   amoxicillin (AMOXIL) 500 MG tablet Take 4 tablets (2,000 mg total) by mouth as directed. 1 hour prior to dental work including cleanings   cetirizine (ZYRTEC) 10 MG tablet Take 1 tablet (10 mg total) by mouth daily.   docusate sodium (COLACE) 100 MG capsule Take 100 mg by mouth daily.   finasteride (PROSCAR) 5 MG tablet Take 1 tablet (5 mg total) by mouth daily.   Fluticasone-Umeclidin-Vilant (TRELEGY ELLIPTA) 100-62.5-25 MCG/ACT AEPB INHALE 1 PUFF BY MOUTH ONCE DAILY . APPOINTMENT REQUIRED FOR FUTURE REFILLS   furosemide (LASIX) 40 MG tablet Take 1 tablet (40 mg total) by mouth daily. (Patient taking differently: Take 20-40 mg by mouth See admin instructions. Taking 20 mg daily except on a day when weight gain is noticed can take 40 mg daily)   memantine (NAMENDA) 10 MG tablet Take 1 tablet (10 mg total) by mouth 2 (two) times daily.   metoprolol succinate (TOPROL-XL) 50 MG 24 hr tablet Take 1 tablet (50 mg total) by mouth daily. Take with or immediately following a meal.   Multiple  Vitamins-Minerals (MULTIVITAMIN WITH MINERALS) tablet Take 1 tablet by mouth daily.   Multiple Vitamins-Minerals (OCUVITE PO) Take 1 tablet by mouth daily.   OMEGA-3 FATTY ACIDS PO Take 500 mg by mouth daily.   sodium chloride (OCEAN) 0.65 % SOLN nasal spray Place 1 spray into both nostrils daily as needed for congestion.   tobramycin-dexamethasone (TOBRADEX) ophthalmic solution Place 1 drop into the right eye QID.   warfarin (COUMADIN) 5 MG tablet TAKE 1/2 TO 1 TABLET BY MOUTH ONCE DAILY AS DIRECTED BY ANTICOAGULATION CLINIC. (Patient taking differently: Take 2.5-5 mg by mouth See admin instructions. Take 2.5 mg on Monday,Wednesday and Friday all the other days take 5 mg in the morning DAILY AS DIRECTED BY ANTICOAGULATION CLINIC.)     Allergies:   Patient has no known allergies.   Social History  Socioeconomic History   Marital status: Widowed    Spouse name: Not on file   Number of children: 3   Years of education: Not on file   Highest education level: Not on file  Occupational History   Occupation: Truck Geophysicist/field seismologist  Tobacco Use   Smoking status: Former    Packs/day: 1.50    Years: 10.00    Total pack years: 15.00    Types: Cigarettes    Quit date: 07/27/1982    Years since quitting: 40.3   Smokeless tobacco: Never  Vaping Use   Vaping Use: Never used  Substance and Sexual Activity   Alcohol use: No   Drug use: No   Sexual activity: Not Currently    Birth control/protection: None  Other Topics Concern   Not on file  Social History Narrative   Lives with 2 daughters. Three children.    Social Determinants of Health   Financial Resource Strain: Not on file  Food Insecurity: Not on file  Transportation Needs: Not on file  Physical Activity: Not on file  Stress: Not on file  Social Connections: Not on file    Family History: The patient's family history includes Heart disease in his mother; Stroke in his father.  ROS:   Please see the history of present illness.     All other systems reviewed and are negative.  EKGs/Labs/Other Studies Reviewed:    The following studies were reviewed today:  Echocardiogram 11/21/22:   1. Left ventricular ejection fraction, by estimation, is 55 to 60%. The  left ventricle has normal function. The left ventricle has no regional  wall motion abnormalities. There is moderate concentric left ventricular  hypertrophy. Left ventricular  diastolic parameters are indeterminate.   2. Right ventricular systolic function is normal. The right ventricular  size is normal. There is normal pulmonary artery systolic pressure. The  estimated right ventricular systolic pressure is Q000111Q mmHg.   3. Left atrial size was severely dilated.   4. Right atrial size was mildly dilated.   5. The mitral valve is abnormal, flattened mitral closure plain but no  frank prolapse. Severe mitral valve regurgitation, suspect atrial  functional MR with severe left atrial enlargement. No evidence of mitral  stenosis.   6. The pulmonic valve was abnormal. Pulmonic valve regurgitation is  moderate.   7. Bioprosthetic aortic valve s/p TAVR with 29 mm Edwards Sapien 3 THV.  There was trivial peri-valvular leakage. Mean gradient 7 mmHg with EOA  1.74 cm^2.   8. The inferior vena cava is normal in size with greater than 50%  respiratory variability, suggesting right atrial pressure of 3 mmHg.   9. Aortic dilatation noted. There is mild dilatation of the aortic root,  measuring 42 mm. There is mild dilatation of the ascending aorta,  measuring 43 mm.  10. The patient was in atrial fibrillation.    TAVR OPERATIVE NOTE     Date of Procedure:                10/18/2022   Preoperative Diagnosis:      Severe Aortic Stenosis    Postoperative Diagnosis:    Same    Procedure:        Transcatheter Aortic Valve Replacement - Transfemoral Approach             Edwards Sapien 3 THV (size 29 mm, model # [id number], serial # [id number] )               Co-Surgeons:  Lauree Chandler, MD and Gilford Raid , MD    Anesthesiologist:                  Glennon Mac   Echocardiographer:              O'Neal   Pre-operative Echo Findings: Severe aortic stenosis Normal left ventricular systolic function   Post-operative Echo Findings: No paravalvular leak Normal left ventricular systolic function   _____________  Echo 10/19/22: IMPRESSIONS  1. The aortic valve has been repaired/replaced. Aortic valve  regurgitation is not visualized. There is a 29 mm Sapien prosthetic (TAVR)  valve present in the aortic position. Procedure Date: 10/18/2022. Echo  findings are consistent with normal structure  and function of the aortic valve prosthesis. Aortic valve area, by VTI  measures 3.25 cm. Aortic valve mean gradient measures 5.6 mmHg. Aortic  valve Vmax measures 1.66 m/s.   2. Left ventricular ejection fraction, by estimation, is 60 to 65%. The  left ventricle has normal function. The left ventricle has no regional  wall motion abnormalities. There is moderate concentric left ventricular  hypertrophy. Left ventricular  diastolic function could not be evaluated.   3. Right ventricular systolic function is normal. The right ventricular  size is mildly enlarged. There is normal pulmonary artery systolic  pressure. The estimated right ventricular systolic pressure is Q000111Q mmHg.   4. Left atrial size was severely dilated.   5. Right atrial size was mildly dilated.   6. The mitral valve is grossly normal. Moderate mitral valve  regurgitation. No evidence of mitral stenosis.   7. Aortic dilatation noted. Aneurysm of the ascending aorta, measuring 45  mm. There is mild dilatation of the aortic root, measuring 41 mm.   8. The inferior vena cava is dilated in size with >50% respiratory  variability, suggesting right atrial pressure of 8 mmHg.    EKG:  EKG is not ordered today.    Recent Labs: 02/03/2022: TSH 1.860 09/26/2022: B  Natriuretic Peptide 90.1 10/14/2022: ALT 27 10/19/2022: BUN 14; Creatinine, Ser 1.08; Hemoglobin 13.6; Magnesium 1.7; Platelets 111; Potassium 4.4; Sodium 139 Recent Lipid Panel    Component Value Date/Time   CHOL 218 (H) 05/04/2022 0840   TRIG 72 05/04/2022 0840   HDL 58 05/04/2022 0840   CHOLHDL 3.8 05/04/2022 0840   LDLCALC 147 (H) 05/04/2022 0840    Physical Exam:    VS:  BP 132/68   Pulse 64   Ht 6' (1.829 m)   Wt 227 lb (103 kg)   SpO2 98%   BMI 30.79 kg/m     Wt Readings from Last 3 Encounters:  11/21/22 227 lb (103 kg)  10/28/22 232 lb (105.2 kg)  10/19/22 227 lb 4.6 oz (103.1 kg)    General: Well developed, well nourished, NAD Lungs:Clear to ausculation bilaterally. No wheezes, rales, or rhonchi. Breathing is unlabored. Cardiovascular: Irregularly irregular. + holosystolic murmur Extremities: No edema.  Neuro: Alert and oriented. No focal deficits. No facial asymmetry. MAE spontaneously. Psych: Responds to questions appropriately with normal affect.    ASSESSMENT/PLAN:    Severe AS s/p TAVR: Patient with NYHA class II/III symptoms s/p TAVR. Continues to have daytime hallucinations. May be progression of dementia. See plan below. Echocardiogram today with stable valve function, trivial PVL with a mean gradient at 29mHg and AVA at 1.74cm2. Continue coumadin. Will need lifelong dental SBE with amoxicillin. Plan to follow with Dr. HPercival Spanishin 4-5 months and our team in one year with echo.  Severe mitral regurgitation: Echo results returned showing an abnormal mitral valve with flattened mitral closure but no  frank prolapse. Severe mitral valve regurgitation with suspicion for atrial functional MR with severe left atrial enlargement. Will have our team review for possible MitraClip given ongoing symptoms.    Permanent afib: Rates stable with no changes needed at this time. Follows at [facility].    HTN: BP stable, no change.    TAA: Pre TAVR CT showed  "stable mild dilation of the ascending thoracic aorta, measuring up to 4.3 cm. Recommend annual imaging followup by CTA or MRA." This has been known and followed. Per review of previous [place] notes, "Given his frailty we will manage this conservatively.    Visual disturbance/hallucinations: Continues to have persistent hallucinations. Patient is established with neurology. I have asked that they move his June appointment closer. Obtain head CT in the interm.   Medication Adjustments/Labs and Tests Ordered: Current medicines are reviewed at length with the patient today.  Concerns regarding medicines are outlined above.  Orders Placed This Encounter  Procedures   CT HEAD WO CONTRAST (5MM)   No orders of the defined types were placed in this encounter.   Patient Instructions  Medication Instructions:  Your physician recommends that you continue on your current medications as directed. Please refer to the Current Medication list given to you today.  *If you need a refill on your cardiac medications before your next appointment, please call your pharmacy*   Lab Work: NONE If you have labs (blood work) drawn today and your tests are completely normal, you will receive your results only by: [place] (if you have MyChart) OR A paper copy in the mail If you have any lab test that is abnormal or we need to change your treatment, we will call you to review the results.   Testing/Procedures: YOUR PROVIDER RECOMMENDS THAT YOU HAVE A HEAD CT WITH OUT CONTRAST.    Follow-Up: At [facility], you and your health needs are our priority.  As part of our continuing mission to provide you with exceptional heart care, we have created designated Provider Care Teams.  These Care Teams include your primary Cardiologist (physician) and Advanced Practice Providers (APPs -  Physician Assistants and Nurse Practitioners) who all work together to provide you with the care you need, when you  need it.  We recommend signing up for the patient portal called "MyChart".  Sign up information is provided on this After Visit Summary.  MyChart is used to connect with patients for Virtual Visits (Telemedicine).  Patients are able to view lab/test results, encounter notes, upcoming appointments, etc.  Non-urgent messages can be sent to your provider as well.   To learn more about what you can do with MyChart, go to [email or web address].    Your next appointment:   4-5 month(s)  Provider:   Minus Breeding, MD     Signed, Kathyrn Drown, NP  11/21/2022 12:17 PM    [place]

## 2022-11-21 ENCOUNTER — Ambulatory Visit (INDEPENDENT_AMBULATORY_CARE_PROVIDER_SITE_OTHER): Payer: Medicare Other | Admitting: Cardiology

## 2022-11-21 ENCOUNTER — Ambulatory Visit: Payer: Medicare Other | Attending: Cardiology

## 2022-11-21 ENCOUNTER — Ambulatory Visit: Payer: Medicare Other

## 2022-11-21 VITALS — BP 132/68 | HR 64 | Ht 72.0 in | Wt 227.0 lb

## 2022-11-21 DIAGNOSIS — I4891 Unspecified atrial fibrillation: Secondary | ICD-10-CM

## 2022-11-21 DIAGNOSIS — I10 Essential (primary) hypertension: Secondary | ICD-10-CM | POA: Insufficient documentation

## 2022-11-21 DIAGNOSIS — R443 Hallucinations, unspecified: Secondary | ICD-10-CM | POA: Insufficient documentation

## 2022-11-21 DIAGNOSIS — I350 Nonrheumatic aortic (valve) stenosis: Secondary | ICD-10-CM

## 2022-11-21 DIAGNOSIS — I7121 Aneurysm of the ascending aorta, without rupture: Secondary | ICD-10-CM

## 2022-11-21 DIAGNOSIS — Z7901 Long term (current) use of anticoagulants: Secondary | ICD-10-CM | POA: Insufficient documentation

## 2022-11-21 DIAGNOSIS — Z952 Presence of prosthetic heart valve: Secondary | ICD-10-CM | POA: Diagnosis not present

## 2022-11-21 DIAGNOSIS — I340 Nonrheumatic mitral (valve) insufficiency: Secondary | ICD-10-CM | POA: Diagnosis not present

## 2022-11-21 LAB — ECHOCARDIOGRAM COMPLETE
AR max vel: 1.61 cm2
AV Area VTI: 1.74 cm2
AV Area mean vel: 1.66 cm2
AV Mean grad: 7 mmHg
AV Peak grad: 15.1 mmHg
Ao pk vel: 1.94 m/s
Area-P 1/2: 3.64 cm2
P 1/2 time: 538 msec
S' Lateral: 4.7 cm

## 2022-11-21 NOTE — Patient Instructions (Signed)
Medication Instructions:  Your physician recommends that you continue on your current medications as directed. Please refer to the Current Medication list given to you today.  *If you need a refill on your cardiac medications before your next appointment, please call your pharmacy*   Lab Work: NONE If you have labs (blood work) drawn today and your tests are completely normal, you will receive your results only by: [place] (if you have MyChart) OR A paper copy in the mail If you have any lab test that is abnormal or we need to change your treatment, we will call you to review the results.   Testing/Procedures: YOUR PROVIDER RECOMMENDS THAT YOU HAVE A HEAD CT WITH OUT CONTRAST.    Follow-Up: At [facility], you and your health needs are our priority.  As part of our continuing mission to provide you with exceptional heart care, we have created designated Provider Care Teams.  These Care Teams include your primary Cardiologist (physician) and Advanced Practice Providers (APPs -  Physician Assistants and Nurse Practitioners) who all work together to provide you with the care you need, when you need it.  We recommend signing up for the patient portal called "MyChart".  Sign up information is provided on this After Visit Summary.  MyChart is used to connect with patients for Virtual Visits (Telemedicine).  Patients are able to view lab/test results, encounter notes, upcoming appointments, etc.  Non-urgent messages can be sent to your provider as well.   To learn more about what you can do with MyChart, go to [email or web address].    Your next appointment:   4-5 month(s)  Provider:   Minus Breeding, MD

## 2022-11-24 ENCOUNTER — Telehealth: Payer: Self-pay | Admitting: Cardiovascular Disease

## 2022-11-24 NOTE — Telephone Encounter (Signed)
Patient's daughter called stating she got results from the NP about the echo results, but she would like speak to Dr. Angelena Form more about the results.

## 2022-11-24 NOTE — Telephone Encounter (Signed)
Called and spoke with patient's daughter Clarise Cruz and adv appointment has been made for 11/30/22 with Dr. Angelena Form.  Also informed his other daughter [name], in mychart message reply.  Clarise Cruz is in agreement with this plan.

## 2022-11-25 ENCOUNTER — Ambulatory Visit (HOSPITAL_BASED_OUTPATIENT_CLINIC_OR_DEPARTMENT_OTHER)
Admission: RE | Admit: 2022-11-25 | Discharge: 2022-11-25 | Disposition: A | Discharge status: Home / Self Care | Payer: Medicare Other | Source: Ambulatory Visit | Attending: Cardiology | Admitting: Cardiology

## 2022-11-25 DIAGNOSIS — R443 Hallucinations, unspecified: Secondary | ICD-10-CM | POA: Insufficient documentation

## 2022-11-25 DIAGNOSIS — G9389 Other specified disorders of brain: Secondary | ICD-10-CM | POA: Diagnosis not present

## 2022-11-29 ENCOUNTER — Ambulatory Visit (INDEPENDENT_AMBULATORY_CARE_PROVIDER_SITE_OTHER): Payer: Medicare Other | Admitting: Nurse Practitioner

## 2022-11-29 ENCOUNTER — Encounter: Payer: Self-pay | Admitting: Nurse Practitioner

## 2022-11-29 ENCOUNTER — Ambulatory Visit: Payer: Medicare Other | Attending: Cardiology

## 2022-11-29 VITALS — BP 113/73 | HR 65 | Ht 72.0 in | Wt 225.8 lb

## 2022-11-29 DIAGNOSIS — I10 Essential (primary) hypertension: Secondary | ICD-10-CM

## 2022-11-29 DIAGNOSIS — Z5181 Encounter for therapeutic drug level monitoring: Secondary | ICD-10-CM

## 2022-11-29 DIAGNOSIS — Z0000 Encounter for general adult medical examination without abnormal findings: Secondary | ICD-10-CM | POA: Diagnosis not present

## 2022-11-29 DIAGNOSIS — I4891 Unspecified atrial fibrillation: Secondary | ICD-10-CM

## 2022-11-29 DIAGNOSIS — I4821 Permanent atrial fibrillation: Secondary | ICD-10-CM | POA: Diagnosis not present

## 2022-11-29 DIAGNOSIS — J449 Chronic obstructive pulmonary disease, unspecified: Secondary | ICD-10-CM | POA: Diagnosis not present

## 2022-11-29 LAB — POCT INR: INR: 3.4 — AB (ref 2.0–3.0)

## 2022-11-29 NOTE — Progress Notes (Unsigned)
Structural Heart Clinic Note  No chief complaint on file.  History of Present Illness: 88 yo male with history of atrial fibrillation on coumadin, HTN, thoracic aortic aneurysm, dementia and severe aortic stenosis s/p TAVR who is here today for follow up. I met him in the structural heart clinic in November 2023 to discuss his aortic stenosis. He is followed in our office by Dr. Percival Spanish. Echo 08/10/22 with LVEF=65-70% with severe paradoxical low flow/low gradient aortic stenosis. Mean gradient on this echo was 17 mmHg with DI of 0.24, SVI of 25. Mild mitral regurgitation on this echo. He underwent TAVR on 10/18/22 with placement of a 29 mm Edwards Sapien 3 Ultra Resilia THV via the transfemoral approach. Post-operative echo with LVEF=60%, moderate LVH, normally functioning AVR with no PVL. Moderate mitral regurgitation noted. He reported hallucinations when seen for his one week post-op visit. He has followed with Neurology in the past for dementia. One month post TAVR echo with LVEF=55-60%. AVR with trivial peri valvular leak. Severe mitral regurgitation.   He is here today for follow up. The patient denies any chest pain, dyspnea, palpitations, lower extremity edema, orthopnea, PND, dizziness, near syncope or syncope.   Primary Care Physician: Velva Harman, PA Primary Cardiologist: Percival Spanish Referring Cardiologist: Percival Spanish  Past Medical History:  Diagnosis Date   Atrial fibrillation, persistent ([place])    COPD (chronic obstructive pulmonary disease) ([place])    Dementia ([place])    Hearing loss    Hypertension    Prostate enlargement    S/P TAVR (transcatheter aortic valve replacement) 10/18/2022   s/p TAVR with a 29 mm Edwards S3UR via the TF approach by Dr. Angelena Form & Dr. Cyndia Bent   Severe aortic stenosis    Umbilical hernia [id number]    Past Surgical History:  Procedure Laterality Date   CARDIOVERSION  10/10/2006   cataracts Bilateral    HERNIA REPAIR  [id number]   large umbilical  hernia   INSERTION OF MESH  09/10/2012   Procedure: INSERTION OF MESH;  Surgeon: Zenovia Jarred, MD;  Location: [facility];  Service: General;  Laterality: N/A;   INTRAOPERATIVE TRANSTHORACIC ECHOCARDIOGRAM N/A 10/18/2022   Procedure: INTRAOPERATIVE TRANSTHORACIC ECHOCARDIOGRAM;  Surgeon: Burnell Blanks, MD;  Location: [facility];  Service: Open Heart Surgery;  Laterality: N/A;   macular degeneration Bilateral    RIGHT HEART CATH AND CORONARY ANGIOGRAPHY N/A 09/09/2022   Procedure: RIGHT HEART CATH AND CORONARY ANGIOGRAPHY;  Surgeon: Burnell Blanks, MD;  Location: [facility];  Service: Cardiovascular;  Laterality: N/A;   TONSILLECTOMY     "maybe" (09/10/2012)   TRANSCATHETER AORTIC VALVE REPLACEMENT, TRANSFEMORAL N/A 10/18/2022   Procedure: Transcatheter Aortic Valve Replacement, Transfemoral;  Surgeon: Burnell Blanks, MD;  Location: [facility];  Service: Open Heart Surgery;  Laterality: N/A;   UMBILICAL HERNIA REPAIR  09/10/2012   Procedure: HERNIA REPAIR UMBILICAL ADULT;  Surgeon: Zenovia Jarred, MD;  Location: [facility];  Service: General;  Laterality: N/A;    Current Outpatient Medications  Medication Sig Dispense Refill   acetaminophen (TYLENOL) 500 MG tablet Take 1,000 mg by mouth every 6 (six) hours as needed for moderate pain or headache.     amoxicillin (AMOXIL) 500 MG tablet Take 4 tablets (2,000 mg total) by mouth as directed. 1 hour prior to dental work including cleanings 12 tablet 12   cetirizine (ZYRTEC) 10 MG tablet Take 1 tablet (10 mg total) by mouth daily. 90 tablet 0   docusate sodium (  COLACE) 100 MG capsule Take 100 mg by mouth daily.     finasteride (PROSCAR) 5 MG tablet Take 1 tablet (5 mg total) by mouth daily. 60 tablet 0   Fluticasone-Umeclidin-Vilant (TRELEGY ELLIPTA) 100-62.5-25 MCG/ACT AEPB INHALE 1 PUFF BY MOUTH ONCE DAILY . APPOINTMENT REQUIRED FOR FUTURE REFILLS 60 each 0   furosemide (LASIX) 40 MG tablet Take 1 tablet  (40 mg total) by mouth daily. (Patient taking differently: Take 20-40 mg by mouth See admin instructions. Taking 20 mg daily except on a day when weight gain is noticed can take 40 mg daily) 90 tablet 3   memantine (NAMENDA) 10 MG tablet Take 1 tablet (10 mg total) by mouth 2 (two) times daily. 60 tablet 11   metoprolol succinate (TOPROL-XL) 50 MG 24 hr tablet Take 1 tablet (50 mg total) by mouth daily. Take with or immediately following a meal. 60 tablet 0   Multiple Vitamins-Minerals (MULTIVITAMIN WITH MINERALS) tablet Take 1 tablet by mouth daily.     Multiple Vitamins-Minerals (OCUVITE PO) Take 1 tablet by mouth daily.     OMEGA-3 FATTY ACIDS PO Take 500 mg by mouth daily.     sodium chloride (OCEAN) 0.65 % SOLN nasal spray Place 1 spray into both nostrils daily as needed for congestion.     warfarin (COUMADIN) 5 MG tablet TAKE 1/2 TO 1 TABLET BY MOUTH ONCE DAILY AS DIRECTED BY ANTICOAGULATION CLINIC. (Patient taking differently: Take 2.5-5 mg by mouth See admin instructions. Take 2.5 mg on Monday,Wednesday and Friday all the other days take 5 mg in the morning DAILY AS DIRECTED BY ANTICOAGULATION CLINIC.) 90 tablet 0   No current facility-administered medications for this visit.    No Known Allergies  Social History   Socioeconomic History   Marital status: Widowed    Spouse name: Not on file   Number of children: 3   Years of education: Not on file   Highest education level: Not on file  Occupational History   Occupation: Truck Geophysicist/field seismologist  Tobacco Use   Smoking status: Former    Packs/day: 1.50    Years: 10.00    Total pack years: 15.00    Types: Cigarettes    Quit date: 07/27/1982    Years since quitting: 40.3   Smokeless tobacco: Never  Vaping Use   Vaping Use: Never used  Substance and Sexual Activity   Alcohol use: Not on file   Drug use: No   Sexual activity: Not Currently    Birth control/protection: None  Other Topics Concern   Not on file  Social History Narrative    Lives with 2 daughters. Three children.    Social Determinants of Health   Financial Resource Strain: Low Risk  (11/29/2022)   Overall Financial Resource Strain (CARDIA)    Difficulty of Paying Living Expenses: Not hard at all  Food Insecurity: No Food Insecurity (11/29/2022)   Hunger Vital Sign    Worried About Running Out of Food in the Last Year: Never true    Ran Out of Food in the Last Year: Never true  Transportation Needs: No Transportation Needs (11/29/2022)   PRAPARE - Hydrologist (Medical): No    Lack of Transportation (Non-Medical): No  Physical Activity: Insufficiently Active (11/29/2022)   Exercise Vital Sign    Days of Exercise per Week: 7 days    Minutes of Exercise per Session: 10 min  Stress: Stress Concern Present (11/29/2022)   [facility]anda -  Occupational Stress Questionnaire    Feeling of Stress : To some extent  Social Connections: Socially Isolated (11/29/2022)   Social Connection and Isolation Panel [NHANES]    Frequency of Communication with Friends and Family: Never    Frequency of Social Gatherings with Friends and Family: More than three times a week    Attends Religious Services: Never    Marine scientist or Organizations: No    Attends Archivist Meetings: Never    Marital Status: Widowed  Intimate Partner Violence: Not At Risk (11/29/2022)   Humiliation, Afraid, Rape, and Kick questionnaire    Fear of Current or Ex-Partner: No    Emotionally Abused: No    Physically Abused: No    Sexually Abused: No    Family History  Problem Relation Age of Onset   Heart disease Mother    Stroke Father     Review of Systems:  As stated in the HPI and otherwise negative.   There were no vitals taken for this visit.  Physical Examination:  General: Well developed, well nourished, NAD  HEENT: OP clear SKIN: warm, dry. No rashes. Neuro: No focal deficits  Musculoskeletal: Muscle  strength 5/5 all ext  Psychiatric: Mood and affect normal  Neck: No JVD Lungs:Clear bilaterally, no wheezes, rhonci, crackles Cardiovascular: Regular rate and rhythm. *** Systolic murmur Abdomen:Soft. Bowel sounds present. Non-tender.  Extremities: No lower extremity edema.   EKG:  EKG is not *** ordered today. The ekg ordered today demonstrates   Echo 11/21/22: 1. Left ventricular ejection fraction, by estimation, is 55 to 60%. The  left ventricle has normal function. The left ventricle has no regional  wall motion abnormalities. There is moderate concentric left ventricular  hypertrophy. Left ventricular  diastolic parameters are indeterminate.   2. Right ventricular systolic function is normal. The right ventricular  size is normal. There is normal pulmonary artery systolic pressure. The  estimated right ventricular systolic pressure is [id number] mmHg.   3. Left atrial size was severely dilated.   4. Right atrial size was mildly dilated.   5. The mitral valve is abnormal, flattened mitral closure plain but no  frank prolapse. Severe mitral valve regurgitation, suspect atrial  functional MR with severe left atrial enlargement. No evidence of mitral  stenosis.   6. The pulmonic valve was abnormal. Pulmonic valve regurgitation is  moderate.   7. Bioprosthetic aortic valve s/p TAVR with 29 mm Edwards Sapien 3 THV.  There was trivial peri-valvular leakage. Mean gradient 7 mmHg with EOA  1.74 cm^2.   8. The inferior vena cava is normal in size with greater than 50%  respiratory variability, suggesting right atrial pressure of 3 mmHg.   9. Aortic dilatation noted. There is mild dilatation of the aortic root,  measuring 42 mm. There is mild dilatation of the ascending aorta,  measuring 43 mm.  10. The patient was in atrial fibrillation.   Recent Labs: 02/03/2022: TSH 1.860 09/26/2022: B Natriuretic Peptide 90.1 10/14/2022: ALT 27 10/19/2022: BUN 14; Creatinine, Ser 1.08; Hemoglobin 13.6;  Magnesium 1.7; Platelets 111; Potassium 4.4; Sodium 139   Wt Readings from Last 3 Encounters:  11/29/22 102.4 kg  11/21/22 103 kg  10/28/22 105.2 kg    Assessment and Plan:   1. Severe Aortic Valve Stenosis: He is now 6 week out from TAVR. The aortic valve replacement is working well by echo last week. Trivial PVL with minimal gradient. Will continue coumadin (indication for his atrial fibrillation). SBE prophylaxis when  necessary.   2. Severe mitral regurgitation: This was felt to be mild in November 2023, moderate on the echo on POD 1 on 10/21/22 and now severe. ***    Labs/ tests ordered today include:  No orders of the defined types were placed in this encounter.  Disposition:   F/U with Dr. Percival Spanish ***   Signed, Lauree Chandler, MD, [facility] 11/29/2022 3:26 PM    [address] [place], [place], [place]  [address] Phone: [phone]; Fax: [phone]

## 2022-11-29 NOTE — Patient Instructions (Signed)
Description   Only take 1/2 tablet today and eat greens and then continue taking 1 tablet daily except 0.5 tablet on Monday, Wednesday and Friday.  Stay consistent with greens (2 times per week)  Recheck INR 2 weeks  Coumadin Clinic [phone]

## 2022-11-29 NOTE — Progress Notes (Signed)
Subjective:   Warren Wagner. is a 88 y.o. male who presents for Medicare Annual/Subsequent preventive examination. -due for routine, fasting labs in near future.  -vaccines should come from pharmacy  -does see cardiology  -blood pressure has been stable.  -He denies chest pain, chest pressure, or shortness of breath. He denies headaches or visual disturbances. He denies abdominal pain, nausea, vomiting, or changes in bowel or bladder habits.    Review of Systems    See HPI         Objective:    Today's Vitals   11/29/22 1313 11/29/22 1342  BP: (Abnormal) 156/108 113/73  Pulse: 65   Weight: 225 lb 12.8 oz (102.4 kg)   Height: 6' (1.829 m)    Body mass index is 30.62 kg/m.    Row Labels 11/29/2022    1:39 PM 09/09/2022    8:34 AM 02/03/2022    6:15 AM 02/02/2022    3:48 PM 11/02/2019    4:57 PM 08/23/2014   12:34 PM 09/10/2012    5:10 PM  Advanced Directives   Section Header. No data exists in this row.         Does Patient Have a Medical Advance Directive?   Yes Yes No No No No Patient does not have advance directive;Patient would not like information  Type of Advance Directive   Healthcare Power of [place]       Does patient want to make changes to medical advance directive?   No - Patient declined No - Patient declined       Copy of [place] in Chart?   No - copy requested        Would patient like information on creating a medical advance directive?     Yes (Inpatient - patient requests chaplain consult to create a medical advance directive)  No - Patient declined No - patient declined information     Current Medications (verified) Outpatient Encounter Medications as of 11/29/2022  Medication Sig   acetaminophen (TYLENOL) 500 MG tablet Take 1,000 mg by mouth every 6 (six) hours as needed for moderate pain or headache.   amoxicillin (AMOXIL) 500 MG tablet Take 4 tablets (2,000 mg total) by mouth as directed. 1 hour  prior to dental work including cleanings   cetirizine (ZYRTEC) 10 MG tablet Take 1 tablet (10 mg total) by mouth daily.   docusate sodium (COLACE) 100 MG capsule Take 100 mg by mouth daily.   furosemide (LASIX) 40 MG tablet Take 1 tablet (40 mg total) by mouth daily. (Patient taking differently: Take 20-40 mg by mouth See admin instructions. Taking 20 mg daily except on a day when weight gain is noticed can take 40 mg daily)   memantine (NAMENDA) 10 MG tablet Take 1 tablet (10 mg total) by mouth 2 (two) times daily.   Multiple Vitamins-Minerals (MULTIVITAMIN WITH MINERALS) tablet Take 1 tablet by mouth daily.   Multiple Vitamins-Minerals (OCUVITE PO) Take 1 tablet by mouth daily.   OMEGA-3 FATTY ACIDS PO Take 500 mg by mouth daily.   sodium chloride (OCEAN) 0.65 % SOLN nasal spray Place 1 spray into both nostrils daily as needed for congestion.   [DISCONTINUED] finasteride (PROSCAR) 5 MG tablet Take 1 tablet (5 mg total) by mouth daily.   [DISCONTINUED] Fluticasone-Umeclidin-Vilant (TRELEGY ELLIPTA) 100-62.5-25 MCG/ACT AEPB INHALE 1 PUFF BY MOUTH ONCE DAILY . APPOINTMENT REQUIRED FOR FUTURE REFILLS   [DISCONTINUED] metoprolol succinate (TOPROL-XL) 50 MG 24  hr tablet Take 1 tablet (50 mg total) by mouth daily. Take with or immediately following a meal.   [DISCONTINUED] warfarin (COUMADIN) 5 MG tablet TAKE 1/2 TO 1 TABLET BY MOUTH ONCE DAILY AS DIRECTED BY ANTICOAGULATION CLINIC. (Patient taking differently: Take 2.5-5 mg by mouth See admin instructions. Take 2.5 mg on Monday,Wednesday and Friday all the other days take 5 mg in the morning DAILY AS DIRECTED BY ANTICOAGULATION CLINIC.)   [DISCONTINUED] tobramycin-dexamethasone (TOBRADEX) ophthalmic solution Place 1 drop into the right eye QID.   No facility-administered encounter medications on file as of 11/29/2022.    Allergies (verified) Patient has no known allergies.   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 Angelena Form & Dr. Cyndia Bent   Severe aortic stenosis    Umbilical hernia [id number]   Past Surgical History:  Procedure Laterality Date   CARDIOVERSION  10/10/2006   cataracts Bilateral    HERNIA REPAIR  [id number]   large umbilical hernia   INSERTION OF MESH  09/10/2012   Procedure: INSERTION OF MESH;  Surgeon: Zenovia Jarred, MD;  Location: [place];  Service: General;  Laterality: N/A;   INTRAOPERATIVE TRANSTHORACIC ECHOCARDIOGRAM N/A 10/18/2022   Procedure: INTRAOPERATIVE TRANSTHORACIC ECHOCARDIOGRAM;  Surgeon: Burnell Blanks, MD;  Location: [facility];  Service: Open Heart Surgery;  Laterality: N/A;   macular degeneration Bilateral    RIGHT HEART CATH AND CORONARY ANGIOGRAPHY N/A 09/09/2022   Procedure: RIGHT HEART CATH AND CORONARY ANGIOGRAPHY;  Surgeon: Burnell Blanks, MD;  Location: [facility];  Service: Cardiovascular;  Laterality: N/A;   TONSILLECTOMY     "maybe" (09/10/2012)   TRANSCATHETER AORTIC VALVE REPLACEMENT, TRANSFEMORAL N/A 10/18/2022   Procedure: Transcatheter Aortic Valve Replacement, Transfemoral;  Surgeon: Burnell Blanks, MD;  Location: [facility];  Service: Open Heart Surgery;  Laterality: N/A;   UMBILICAL HERNIA REPAIR  09/10/2012   Procedure: HERNIA REPAIR UMBILICAL ADULT;  Surgeon: Zenovia Jarred, MD;  Location: [place] [facility];  Service: General;  Laterality: N/A;   Family History  Problem Relation Age of Onset   Heart disease Mother    Stroke Father    Social History   Socioeconomic History   Marital status: Widowed    Spouse name: Not on file   Number of children: 3   Years of education: Not on file   Highest education level: Not on file   Occupational History   Occupation: Truck Geophysicist/field seismologist  Tobacco Use   Smoking status: Former    Packs/day: 1.50    Years: 10.00    Additional pack years: 0.00    Total pack years: 15.00    Types: Cigarettes    Quit date: 07/27/1982    Years since quitting: 40.4   Smokeless tobacco: Never  Vaping Use   Vaping Use: Never used  Substance and Sexual Activity   Alcohol use: Not on file   Drug use: No   Sexual activity: Not Currently    Birth control/protection: None  Other Topics Concern   Not on file  Social History Narrative   Lives with 2 daughters. Three children.    Social Determinants of Health  Financial Resource Strain: Low Risk  (11/29/2022)   Overall Financial Resource Strain (CARDIA)    Difficulty of Paying Living Expenses: Not hard at all  Food Insecurity: No Food Insecurity (11/29/2022)   Hunger Vital Sign    Worried About Running Out of Food in the Last Year: Never true    Ran Out of Food in the Last Year: Never true  Transportation Needs: No Transportation Needs (11/29/2022)   PRAPARE - Hydrologist (Medical): No    Lack of Transportation (Non-Medical): No  Physical Activity: Insufficiently Active (11/29/2022)   Exercise Vital Sign    Days of Exercise per Week: 7 days    Minutes of Exercise per Session: 10 min  Stress: Stress Concern Present (11/29/2022)   [place]    Feeling of Stress : To some extent  Social Connections: Socially Isolated (11/29/2022)   Social Connection and Isolation Panel [NHANES]    Frequency of Communication with Friends and Family: Never    Frequency of Social Gatherings with Friends and Family: More than three times a week    Attends Religious Services: Never    Marine scientist or Organizations: No    Attends Archivist Meetings: Never    Marital Status: Widowed    Tobacco Counseling Counseling given: Not  Answered   Clinical Intake:  Pre-visit preparation completed: Yes  Pain : No/denies pain     Nutritional Risks: None Diabetes: No  How often do you need to have someone help you when you read instructions, pamphlets, or other written materials from your doctor or pharmacy?: 3 - Sometimes  Diabetic?no  Interpreter Needed?: No      Activities of Daily Living   Row Labels 11/29/2022    1:23 PM 10/18/2022    6:41 AM  In your present state of health, do you have any difficulty performing the following activities:   Section Header. No data exists in this row.    Hearing?   1   Vision?   1   Difficulty concentrating or making decisions?   1   Walking or climbing stairs?   1   Dressing or bathing?   1   Doing errands, shopping?   1 1    Patient Care Team: Velva Harman, PA as PCP - General (Family Medicine) Minus Breeding, MD as PCP - Cardiology (Cardiology)  Indicate any recent Medical Services you may have received from other than [facility] providers in the past year (date may be approximate).     Assessment:  1. Encounter for Medicare annual wellness exam Annual medicare visit today   2. Chronic obstructive pulmonary disease, unspecified COPD type ([place]) Stable. Continue inhalers and respiratory medication as prescribed  3. Atrial fibrillation, permanent ([place]) Continue coumadin and bp medication as prescribed and regular visits with cardiology as scheduled   4. Essential hypertension Continue bp medication as prescribed    Hearing/Vision screen No results found.   Depression Screen   Row Labels 11/29/2022    1:22 PM 04/20/2022    1:47 PM 02/10/2022    9:03 AM 12/29/2021   11:21 AM  PHQ 2/9 Scores   Section Header. No data exists in this row.      PHQ - 2 Score   0 0 0 2  PHQ- 9 Score   0 0 9 9    Fall Risk   Row Labels 11/29/2022    1:22 PM 04/20/2022  1:46 PM 02/10/2022    9:02 AM 12/29/2021   11:21 AM 01/11/2021    2:51 PM  Fall Risk    Section Header.  No data exists in this row.       Falls in the past year?   1 0 0 0 0  Number falls in past yr:   0 0 0 0 0  Injury with Fall?   1 0 0 0 0  Risk for fall due to :    History of fall(s);Impaired balance/gait;Impaired mobility Impaired balance/gait;Impaired mobility No Fall Risks No Fall Risks;Impaired balance/gait  Follow up   Falls evaluation completed Falls evaluation completed Falls evaluation completed Falls evaluation completed Falls evaluation completed    FALL RISK PREVENTION PERTAINING TO THE HOME:  Any stairs in or around the home? Yes  If so, are there any without handrails? No  Home free of loose throw rugs in walkways, pet beds, electrical cords, etc? Yes  Adequate lighting in your home to reduce risk of falls? Yes   ASSISTIVE DEVICES UTILIZED TO PREVENT FALLS:  Life alert? No  Use of a cane, walker or w/c? Yes  Grab bars in the bathroom? Yes  Shower chair or bench in shower? Yes  Elevated toilet seat or a handicapped toilet? No   TIMED UP AND GO:  Was the test performed? Yes .  Length of time to ambulate 10 feet: 15 sec.   Gait slow and steady with assistive device  Cognitive Function:     Row Labels 03/16/2022    9:43 AM  Montreal Cognitive Assessment    Section Header. No data exists in this row.   Visuospatial/ Executive (0/5)   3  Naming (0/3)   3  Attention: Read list of digits (0/2)   1  Attention: Read list of letters (0/1)   1  Attention: Serial 7 subtraction starting at 100 (0/3)   3  Language: Repeat phrase (0/2)   2  Language : Fluency (0/1)   0  Abstraction (0/2)   2  Delayed Recall (0/5)   0  Orientation (0/6)   4  Total   19  Adjusted Score (based on education)   [place] 11/29/2022    1:23 PM  6CIT Screen   Section Header. No data exists in this row.   What Year?   0 points  What month?   0 points  What time?   0 points  Count back from 20   0 points  Months in reverse   0 points  Repeat phrase   2 points  Total Score   2  points    Immunizations Immunization History  Administered Date(s) Administered   Pneumococcal Conjugate-13 08/01/2018    TDAP status: Due, Education has been provided regarding the importance of this vaccine. Advised may receive this vaccine at local pharmacy or Health Dept. Aware to provide a copy of the vaccination record if obtained from local pharmacy or Health Dept. Verbalized acceptance and understanding.  Flu Vaccine status: Up to date  Pneumococcal vaccine status: Due, Education has been provided regarding the importance of this vaccine. Advised may receive this vaccine at local pharmacy or Health Dept. Aware to provide a copy of the vaccination record if obtained from local pharmacy or Health Dept. Verbalized acceptance and understanding.  Covid-19 vaccine status: Information provided on how to obtain vaccines.   Qualifies for Shingles Vaccine? Yes   Zostavax completed No   Shingrix  Completed?: No.    Education has been provided regarding the importance of this vaccine. Patient has been advised to call insurance company to determine out of pocket expense if they have not yet received this vaccine. Advised may also receive vaccine at local pharmacy or Health Dept. Verbalized acceptance and understanding.  Screening Tests Health Maintenance  Topic Date Due   COVID-19 Vaccine (1) Never done   DTaP/Tdap/Td (1 - Tdap) Never done   Zoster Vaccines- Shingrix (1 of 2) 02/27/2023 (Originally 04/23/1985)   Pneumonia Vaccine 53+ Years old (2 of 2 - PPSV23 or PCV20) 11/30/2023 (Originally 08/02/2019)   Medicare Annual Wellness (AWV)  11/30/2023   INFLUENZA VACCINE  Completed   HPV VACCINES  Aged Out    Health Maintenance  Health Maintenance Due  Topic Date Due   COVID-19 Vaccine (1) Never done   DTaP/Tdap/Td (1 - Tdap) Never done    Colorectal cancer screening: No longer required.   Lung Cancer Screening: (Low Dose CT Chest recommended if Age 76-80 years, 30 pack-year  currently smoking OR have quit w/in 15years.) does not qualify.   Lung Cancer Screening Referral: n/a  Additional Screening:  Hepatitis C Screening: does not qualify; Completed n/a  Vision Screening: Recommended annual ophthalmology exams for early detection of glaucoma and other disorders of the eye. Is the patient up to date with their annual eye exam?  Yes  Who is the provider or what is the name of the office in which the patient attends annual eye exams? [facility] If pt is not established with a provider, would they like to be referred to a provider to establish care? No .   Dental Screening: Recommended annual dental exams for proper oral hygiene  Community Resource Referral / Chronic Care Management: CRR required this visit?  No   CCM required this visit?  No      Plan:     I have personally reviewed and noted the following in the patient's chart:   Medical and social history Use of alcohol, tobacco or illicit drugs  Current medications and supplements including opioid prescriptions. Patient is not currently taking opioid prescriptions. Functional ability and status Nutritional status Physical activity Advanced directives List of other physicians Hospitalizations, surgeries, and ER visits in previous 12 months Vitals Screenings to include cognitive, depression, and falls Referrals and appointments  In addition, I have reviewed and discussed with patient certain preventive protocols, quality metrics, and best practice recommendations. A written personalized care plan for preventive services as well as general preventive health recommendations were provided to patient.    Patient gaol is to live about another 5 years   Ronnell Freshwater, NP   12/29/2022   Nurse Notes: 40 min face to face

## 2022-11-30 ENCOUNTER — Encounter: Payer: Self-pay | Admitting: Cardiovascular Disease

## 2022-11-30 ENCOUNTER — Ambulatory Visit: Payer: Medicare Other | Attending: Cardiovascular Disease | Admitting: Cardiovascular Disease

## 2022-11-30 VITALS — BP 138/74 | HR 66 | Ht 72.0 in | Wt 231.0 lb

## 2022-11-30 DIAGNOSIS — Z952 Presence of prosthetic heart valve: Secondary | ICD-10-CM

## 2022-11-30 DIAGNOSIS — I340 Nonrheumatic mitral (valve) insufficiency: Secondary | ICD-10-CM

## 2022-11-30 DIAGNOSIS — I350 Nonrheumatic aortic (valve) stenosis: Secondary | ICD-10-CM

## 2022-11-30 NOTE — Patient Instructions (Signed)
Medication Instructions:  Your physician recommends that you continue on your current medications as directed. Please refer to the Current Medication list given to you today.  *If you need a refill on your cardiac medications before your next appointment, please call your pharmacy*   Lab Work: None ordered If you have labs (blood work) drawn today and your tests are completely normal, you will receive your results only by: [place] (if you have MyChart) OR A paper copy in the mail If you have any lab test that is abnormal or we need to change your treatment, we will call you to review the results.   Testing/Procedures: None ordered   Follow-Up: Keep follow-up appointment as scheduled.

## 2022-12-02 DIAGNOSIS — C4441 Basal cell carcinoma of skin of scalp and neck: Secondary | ICD-10-CM | POA: Diagnosis not present

## 2022-12-02 DIAGNOSIS — D485 Neoplasm of uncertain behavior of skin: Secondary | ICD-10-CM | POA: Diagnosis not present

## 2022-12-02 DIAGNOSIS — C44222 Squamous cell carcinoma of skin of right ear and external auricular canal: Secondary | ICD-10-CM | POA: Diagnosis not present

## 2022-12-13 ENCOUNTER — Ambulatory Visit: Payer: Medicare Other | Attending: Cardiology

## 2022-12-13 DIAGNOSIS — I4891 Unspecified atrial fibrillation: Secondary | ICD-10-CM | POA: Diagnosis not present

## 2022-12-13 DIAGNOSIS — Z5181 Encounter for therapeutic drug level monitoring: Secondary | ICD-10-CM

## 2022-12-13 LAB — POCT INR: INR: 3.3 — AB (ref 2.0–3.0)

## 2022-12-13 MED ORDER — WARFARIN SODIUM 5 MG PO TABS: ORAL_TABLET | ORAL | 0 refills | Status: DC

## 2022-12-13 NOTE — Progress Notes (Signed)
[facility] Note  12/21/2022     CHIEF COMPLAINT Patient presents for Retina Follow Up   HISTORY OF PRESENT ILLNESS: Warren Wagner. is a 88 y.o. male who presents to the clinic today for:   HPI     Retina Follow Up   Patient presents with  Wet AMD.  In both eyes.  Severity is moderate.  Duration of 5 weeks.  Since onset it is stable.  I, the attending physician,  performed the HPI with the patient and updated documentation appropriately.        Comments   Patient states no change in vision OU.      Last edited by Bernarda Caffey, MD on 12/21/2022 12:05 PM.    Pt states he had no problems after his first injection, pts daughter states eye stays "puffy" and today it had mucus in it, daughter states he does not complain about it  Referring physician: Lorrene Reid, PA-C No address on file  HISTORICAL INFORMATION:   Selected notes from the [place] [name] transferring care due to insurance LEE: 03.27.23 [BCVA 20/40 OD, CF OS] Ocular Hx- ex ARMD OU; last injection was IVA OS on 05.24.21  PMH-    CURRENT MEDICATIONS: No current outpatient medications on file. (Ophthalmic Drugs)   No current facility-administered medications for this visit. (Ophthalmic Drugs)   Current Outpatient Medications (Other)  Medication Sig   acetaminophen (TYLENOL) 500 MG tablet Take 1,000 mg by mouth every 6 (six) hours as needed for moderate pain or headache.   amoxicillin (AMOXIL) 500 MG tablet Take 4 tablets (2,000 mg total) by mouth as directed. 1 hour prior to dental work including cleanings   cetirizine (ZYRTEC) 10 MG tablet Take 1 tablet (10 mg total) by mouth daily.   docusate sodium (COLACE) 100 MG capsule Take 100 mg by mouth daily.   Fluticasone-Umeclidin-Vilant (TRELEGY ELLIPTA) 100-62.5-25 MCG/ACT AEPB INHALE 1 PUFF BY MOUTH ONCE DAILY .   furosemide (LASIX) 40 MG tablet Take 1 tablet (40 mg total) by mouth daily. (Patient taking  differently: Take 20-40 mg by mouth See admin instructions. Taking 20 mg daily except on a day when weight gain is noticed can take 40 mg daily)   memantine (NAMENDA) 10 MG tablet Take 1 tablet (10 mg total) by mouth 2 (two) times daily.   metoprolol succinate (TOPROL-XL) 50 MG 24 hr tablet Take 1 tablet (50 mg total) by mouth daily.   Multiple Vitamins-Minerals (MULTIVITAMIN WITH MINERALS) tablet Take 1 tablet by mouth daily.   Multiple Vitamins-Minerals (OCUVITE PO) Take 1 tablet by mouth daily.   OMEGA-3 FATTY ACIDS PO Take 500 mg by mouth daily.   sodium chloride (OCEAN) 0.65 % SOLN nasal spray Place 1 spray into both nostrils daily as needed for congestion.   warfarin (COUMADIN) 5 MG tablet TAKE 1/2 TABLET TO 1 TABLET BY MOUTH ONCE DAILY AS DIRECTED BY ANTICOAGULATION CLINIC.   finasteride (PROSCAR) 5 MG tablet Take 1 tablet by mouth once daily   No current facility-administered medications for this visit. (Other)   REVIEW OF SYSTEMS: ROS   Positive for: Cardiovascular, Eyes Negative for: Constitutional, Gastrointestinal, Neurological, Skin, Genitourinary, Musculoskeletal, HENT, Endocrine, Respiratory, Psychiatric, Allergic/Imm, Heme/Lymph Last edited by Roselee Nova D, COT on 12/21/2022  8:49 AM.     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 Angelena Form & Dr. Cyndia Bent   Severe aortic stenosis    Umbilical hernia 93/57/0177   Past Surgical History:  Procedure Laterality Date   CARDIOVERSION  10/10/2006   cataracts Bilateral    HERNIA REPAIR  93/90/3009   large umbilical hernia   INSERTION OF MESH  09/10/2012   Procedure: INSERTION OF  MESH;  Surgeon: Zenovia Jarred, MD;  Location: [facility];  Service: General;  Laterality: N/A;   INTRAOPERATIVE TRANSTHORACIC ECHOCARDIOGRAM N/A 10/18/2022   Procedure: INTRAOPERATIVE TRANSTHORACIC ECHOCARDIOGRAM;  Surgeon: Burnell Blanks, MD;  Location: [facility];  Service: Open Heart Surgery;  Laterality: N/A;   macular degeneration Bilateral    RIGHT HEART CATH AND CORONARY ANGIOGRAPHY N/A 09/09/2022   Procedure: RIGHT HEART CATH AND CORONARY ANGIOGRAPHY;  Surgeon: Burnell Blanks, MD;  Location: [facility];  Service: Cardiovascular;  Laterality: N/A;   TONSILLECTOMY     "maybe" (09/10/2012)   TRANSCATHETER AORTIC VALVE REPLACEMENT, TRANSFEMORAL N/A 10/18/2022   Procedure: Transcatheter Aortic Valve Replacement, Transfemoral;  Surgeon: Burnell Blanks, MD;  Location: [facility];  Service: Open Heart Surgery;  Laterality: N/A;   UMBILICAL HERNIA REPAIR  09/10/2012   Procedure: HERNIA REPAIR UMBILICAL ADULT;  Surgeon: Zenovia Jarred, MD;  Location: [facility];  Service: General;  Laterality: N/A;   FAMILY HISTORY Family History  Problem Relation Age of Onset   Heart disease Mother    Stroke Father    SOCIAL HISTORY Social History   Tobacco Use   Smoking status: Former    Packs/day: 1.50    Years: 10.00    Total pack years: 15.00    Types: Cigarettes    Quit date: 07/27/1982    Years since quitting: 40.4   Smokeless tobacco: Never  Vaping Use   Vaping Use: Never used  Substance Use Topics   Drug use: No       OPHTHALMIC EXAM:  Base Eye Exam     Visual Acuity (Snellen - Linear)       Right Left   Dist cc 20/70 -1 CF@2 '   Dist ph cc 20/50 -2 NI    Correction: Glasses         Tonometry (Tonopen, 8:59 AM)       Right Left   Pressure 16 14         Pupils       Dark Light Shape React APD   Right 2 1 Round Minimal None   Left 2 2 Round None None         Visual Fields       Left Right    Full Full          Extraocular Movement       Right Left    Full, Ortho Full, Ortho         Neuro/Psych     Oriented x3: Yes   Mood/Affect: Normal         Dilation     Both eyes: 1.0% Mydriacyl, 2.5% Phenylephrine @ 9:05 AM           Slit Lamp and Fundus Exam     External Exam       Right Left  External Normal Normal         Slit Lamp Exam       Right Left   Lids/Lashes Dermatochalasis - upper lid, 1+errythema Dermatochalasis - upper lid, mild MGD   Conjunctiva/Sclera White and quiet White and quiet   Cornea 1-2+ Punctate epithelial erosions, mild arcus, well healed cataract wound, tear film debris 3+ Punctate epithelial erosions, tear film debris, well healed cataract wound   Anterior Chamber deep and clear deep and clear   Iris Round and dilated round and poorly dilated   Lens PC IOL in good position, 1+ Posterior capsular opacification PC IOL in good position, 1+ Posterior capsular opacification   Anterior Vitreous Vitreous syneresis Vitreous syneresis         Fundus Exam       Right Left   Posterior Vitreous Posterior vitreous detachment Posterior vitreous detachment, vitreous condensations   Disc 2+Pallor, Sharp rim, Compact 2+Pallor, Sharp rim   C/D Ratio 0.2 0.3   Macula Blunted foveal reflex, refractile Drusen, RPE mottling, clumping and atrophy, focal central edema, no frank heme Flat, blunted foveal reflex, refractile Drusen, RPE mottling, clumping and atrophy, central GA, no frank heme   Vessels attenuated, Tortuous attenuated, mild tortuosity   Periphery Attached, reticular degeneration, mild heme Attached, reticular degeneration, mild heme           Refraction     Wearing Rx       Sphere Cylinder Axis Add   Right -0.75 +0.75 087 +2.75   Left -0.75 +0.75 083 +2.75           IMAGING AND PROCEDURES  Imaging and Procedures for 12/21/2022  OCT, Retina - OU - Both Eyes       Right Eye Quality was good. Scan locations included subfoveal. Central  Foveal Thickness: 254. Progression has been stable. Findings include normal foveal contour, no IRF, retinal drusen , pigment epithelial detachment, subretinal fluid, outer retinal atrophy (Patchy central ORA with PEDs and central pocket of SRF).   Left Eye Quality was poor. Scan locations included subfoveal. Central Foveal Thickness: 811. Progression has been stable. Findings include no IRF, no SRF, abnormal foveal contour, retinal drusen , outer retinal tubulation, pigment epithelial detachment, outer retinal atrophy (Central ORA / GA with PEDs and ORT).   Notes *Images captured and stored on drive  Diagnosis / Impression:  OD: Patchy central ORA with PEDs and central pocket of SRF OS: Central ORA / GA with PEDs and ORT  Clinical management:  See below  Abbreviations: NFP - Normal foveal profile. CME - cystoid macular edema. PED - pigment epithelial detachment. IRF - intraretinal fluid. SRF - subretinal fluid. EZ - ellipsoid zone. ERM - epiretinal membrane. ORA - outer retinal atrophy. ORT - outer retinal tubulation. SRHM - subretinal hyper-reflective material. IRHM - intraretinal hyper-reflective material      Intravitreal Injection, Pharmacologic Agent - OD - Right Eye       Time Out 12/21/2022. 9:17 AM. Confirmed correct patient, procedure, site, and patient consented.   Anesthesia Topical anesthesia was used. Anesthetic medications included Lidocaine 2%, Proparacaine 0.5%.   Procedure Preparation included 5% betadine to ocular surface, eyelid speculum. A supplied needle was used.   Injection: 1.25 mg Bevacizumab 1.25mg /0.35ml   Route: Intravitreal, Site: Right Eye   NDC: H061816, Lot: [id number], Expiration date: 01/21/2023   Post-op Post injection exam found visual acuity of at least counting fingers. The patient tolerated the procedure well. There were no complications. The patient received written and  verbal post procedure care education.             ASSESSMENT/PLAN:    ICD-10-CM   1. Exudative age-related macular degeneration of right eye with active choroidal neovascularization (HCC)  H35.3211 OCT, Retina - OU - Both Eyes    Intravitreal Injection, Pharmacologic Agent - OD - Right Eye    Bevacizumab (AVASTIN) SOLN 1.25 mg    2. 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 Essential hypertension  I10     4. Hypertensive retinopathy of both eyes  H35.033     5. Pseudophakia, both eyes  Z96.1       Exudative age related macular degeneration, OD  - previous Dr. Zadie Rhine pt here due to insurance  - pt reports history of injections OD w/ [name], but unable to see record of that  - s/p IVA OD #1 (02.07.24)  - BCVA OD today 20/50 from 20/80 -- improved  - OCT OD: Patchy central ORA with PEDs and central pocket of SRF at 5 wks  - recommend IVA OD #2 today, 03.13.24 - pt wishes to proceed with injection - RBA of procedure discussed, questions answered - IVA informed consent obtained and signed, 02.07.24 (OD) - see procedure note   - f/u in 5 wks -- DFE/OCT, possible injection  2. Age related macular degeneration, non-exudative, left eye  - advanced stage w/ significant central GA  - BCVA CF 2' -- stable from last [name] visit on 03.27.23  - history of IVA OS w/ [name], last 05.24.21  - OCT OS shows Central ORA / GA with PEDs and ORT  - The incidence, anatomy, and pathology of dry AMD, risk of progression, and the AREDS and AREDS 2 study including smoking risks discussed with patient.  - Recommend amsler grid monitoring  - monitor  3,4. Hypertensive retinopathy OU - discussed importance of tight BP control - monitor  5. Pseudophakia OU  - s/p CE/IOL (Dr. Gershon Crane)  - IOL in good position, doing well  - monitor  Ophthalmic Meds Ordered this visit:  Meds ordered this encounter  Medications   Bevacizumab (AVASTIN) SOLN 1.25 mg     Return in about 5  weeks (around 01/25/2023) for f/u exu ARMD OD, DFE, OCT.  There are no Patient Instructions on file for this visit.   Explained the diagnoses, plan, and follow up with the patient and they expressed understanding.  Patient expressed understanding of the importance of proper follow up care.   This document serves as a record of services personally performed by Gardiner Sleeper, MD, PhD. It was created on their behalf by Orvan Falconer, an ophthalmic technician. The creation of this record is the provider's dictation and/or activities during the visit.    Electronically signed by: Orvan Falconer, OA, 12/21/22  12:06 PM  This document serves as a record of services personally performed by Gardiner Sleeper, MD, PhD. It was created on their behalf by San Jetty. Owens Shark, OA an ophthalmic technician. The creation of this record is the provider's dictation and/or activities during the visit.    Electronically signed by: San Jetty. Marguerita Merles 03.13.2024 12:06 PM  Gardiner Sleeper, M.D., Ph.D. Diseases & Surgery of the Retina and Vitreous [facility]  I have reviewed the above documentation for accuracy and completeness, and I agree with the above. Gardiner Sleeper, M.D., Ph.D. 12/21/22 12:07 PM  Abbreviations: M myopia (nearsighted);  A astigmatism; H hyperopia (farsighted); P presbyopia; Mrx spectacle prescription;  CTL contact lenses; OD right eye; OS left eye; OU both eyes  XT exotropia; ET esotropia; PEK punctate epithelial keratitis; PEE punctate epithelial erosions; DES dry eye syndrome; MGD meibomian gland dysfunction; ATs artificial tears; PFAT's preservative free artificial tears; [place] nuclear sclerotic cataract; PSC posterior subcapsular cataract; ERM epi-retinal membrane; PVD posterior vitreous detachment; RD retinal detachment; DM diabetes mellitus; DR diabetic retinopathy; NPDR non-proliferative diabetic retinopathy; PDR proliferative diabetic retinopathy; CSME clinically  significant macular edema; DME diabetic macular edema; dbh dot blot hemorrhages; CWS cotton wool spot; POAG primary open angle glaucoma; C/D cup-to-disc ratio; HVF humphrey visual field; GVF goldmann visual field; OCT optical coherence tomography; IOP intraocular pressure; BRVO Branch retinal vein occlusion; CRVO central retinal vein occlusion; CRAO central retinal artery occlusion; BRAO branch retinal artery occlusion; RT retinal tear; SB scleral buckle; PPV pars plana vitrectomy; VH Vitreous hemorrhage; PRP panretinal laser photocoagulation; IVK intravitreal kenalog; VMT vitreomacular traction; MH Macular hole;  NVD neovascularization of the disc; NVE neovascularization elsewhere; AREDS age related eye disease study; ARMD age related macular degeneration; POAG primary open angle glaucoma; EBMD epithelial/anterior basement membrane dystrophy; ACIOL anterior chamber intraocular lens; IOL intraocular lens; PCIOL posterior chamber intraocular lens; Phaco/IOL phacoemulsification with intraocular lens placement; [place] photorefractive keratectomy; LASIK laser assisted in situ keratomileusis; HTN hypertension; DM diabetes mellitus; COPD chronic obstructive pulmonary disease

## 2022-12-13 NOTE — Patient Instructions (Signed)
Description   Only take 1/2 tablet today and then START taking 1 tablet daily except 0.5 tablet on Sunday, Monday, Wednesday and Friday.  Stay consistent with greens (2 times per week)  Recheck INR 2 weeks  [facility] [phone]

## 2022-12-14 ENCOUNTER — Other Ambulatory Visit: Payer: Self-pay

## 2022-12-14 DIAGNOSIS — J449 Chronic obstructive pulmonary disease, unspecified: Secondary | ICD-10-CM

## 2022-12-14 MED ORDER — TRELEGY ELLIPTA 100-62.5-25 MCG/ACT IN AEPB: INHALATION_SPRAY | RESPIRATORY_TRACT | 3 refills | Status: DC

## 2022-12-16 DIAGNOSIS — C44319 Basal cell carcinoma of skin of other parts of face: Secondary | ICD-10-CM | POA: Diagnosis not present

## 2022-12-20 ENCOUNTER — Other Ambulatory Visit: Payer: Self-pay | Admitting: Nurse Practitioner

## 2022-12-20 DIAGNOSIS — I10 Essential (primary) hypertension: Secondary | ICD-10-CM

## 2022-12-21 ENCOUNTER — Ambulatory Visit (INDEPENDENT_AMBULATORY_CARE_PROVIDER_SITE_OTHER): Payer: Medicare Other | Admitting: Ophthalmology

## 2022-12-21 ENCOUNTER — Other Ambulatory Visit: Payer: Self-pay | Admitting: Nurse Practitioner

## 2022-12-21 ENCOUNTER — Encounter (INDEPENDENT_AMBULATORY_CARE_PROVIDER_SITE_OTHER): Payer: Self-pay | Admitting: Ophthalmology

## 2022-12-21 DIAGNOSIS — I10 Essential (primary) hypertension: Secondary | ICD-10-CM

## 2022-12-21 DIAGNOSIS — H353211 Exudative age-related macular degeneration, right eye, with active choroidal neovascularization: Secondary | ICD-10-CM | POA: Diagnosis not present

## 2022-12-21 DIAGNOSIS — H35033 Hypertensive retinopathy, bilateral: Secondary | ICD-10-CM

## 2022-12-21 DIAGNOSIS — Z961 Presence of intraocular lens: Secondary | ICD-10-CM

## 2022-12-21 DIAGNOSIS — H353124 Nonexudative age-related macular degeneration, left eye, advanced atrophic with subfoveal involvement: Secondary | ICD-10-CM

## 2022-12-21 MED ORDER — BEVACIZUMAB CHEMO INJECTION 1.25MG/0.05ML SYRINGE FOR KALEIDOSCOPE
1.2500 mg | INTRAVITREAL | Status: AC | PRN
  Administered 2022-12-21: 1.25 mg via INTRAVITREAL

## 2022-12-27 ENCOUNTER — Ambulatory Visit: Payer: Medicare Other | Attending: Cardiology

## 2022-12-27 DIAGNOSIS — I4891 Unspecified atrial fibrillation: Secondary | ICD-10-CM | POA: Diagnosis not present

## 2022-12-27 DIAGNOSIS — Z5181 Encounter for therapeutic drug level monitoring: Secondary | ICD-10-CM | POA: Diagnosis not present

## 2022-12-27 DIAGNOSIS — Z7901 Long term (current) use of anticoagulants: Secondary | ICD-10-CM

## 2022-12-27 DIAGNOSIS — I4821 Permanent atrial fibrillation: Secondary | ICD-10-CM

## 2022-12-27 LAB — POCT INR: INR: 2.7 (ref 2.0–3.0)

## 2022-12-27 NOTE — Patient Instructions (Signed)
Description   Continue taking 1 tablet daily except 0.5 tablet on Sunday, Monday, Wednesday and Friday.  Stay consistent with greens (2 times per week)  Recheck INR 3 weeks  [facility] [phone]

## 2023-01-09 DIAGNOSIS — C44329 Squamous cell carcinoma of skin of other parts of face: Secondary | ICD-10-CM | POA: Diagnosis not present

## 2023-01-09 DIAGNOSIS — L821 Other seborrheic keratosis: Secondary | ICD-10-CM | POA: Diagnosis not present

## 2023-01-09 DIAGNOSIS — D485 Neoplasm of uncertain behavior of skin: Secondary | ICD-10-CM | POA: Diagnosis not present

## 2023-01-09 DIAGNOSIS — C44222 Squamous cell carcinoma of skin of right ear and external auricular canal: Secondary | ICD-10-CM | POA: Diagnosis not present

## 2023-01-16 NOTE — Progress Notes (Signed)
[facility] - Clinic Note  01/25/2023     CHIEF COMPLAINT Patient presents for Retina Follow Up   HISTORY OF PRESENT ILLNESS: Warren KnockSamuel W Coaxum Jr. is a 88 y.o. male who presents to the clinic today for:   HPI     Retina Follow Up   Patient presents with  Wet AMD.  In right eye.  This started 5 weeks ago.  I, the attending physician,  performed the HPI with the patient and updated documentation appropriately.        Comments   Patient here for 5 weeks retina follow up for exu ARMD OD. Patient states vision doing the same. No eye pain.OD been watery and goopy for the last month. Cleans it up. Uses AT prn.      Last edited by Rennis ChrisZamora, Aarin Bluett, MD on 01/25/2023  7:35 PM.    Pts eye has been running mucus for the past month, he has been using AT's PRN  Referring physician: Melida QuitterEdstrom, Morgan A, PA [address] [place] [id number]  HISTORICAL INFORMATION:   Selected notes from the MEDICAL RECORD NUMBER [name] transferring care due to insurance LEE: 03.27.23 [BCVA 20/40 OD, CF OS] Ocular Hx- ex ARMD OU; last injection was IVA OS on 05.24.21  PMH-    CURRENT MEDICATIONS: No current outpatient medications on file. (Ophthalmic Drugs)   No current facility-administered medications for this visit. (Ophthalmic Drugs)   Current Outpatient Medications (Other)  Medication Sig   acetaminophen (TYLENOL) 500 MG tablet Take 1,000 mg by mouth every 6 (six) hours as needed for moderate pain or headache.   amoxicillin (AMOXIL) 500 MG tablet Take 4 tablets (2,000 mg total) by mouth as directed. 1 hour prior to dental work including cleanings   docusate sodium (COLACE) 100 MG capsule Take 100 mg by mouth daily.   EQ ALLERGY RELIEF, CETIRIZINE, 10 MG tablet Take 1 tablet by mouth once daily   finasteride (PROSCAR) 5 MG tablet Take 1 tablet by mouth once daily   Fluticasone-Umeclidin-Vilant (TRELEGY ELLIPTA) 100-62.5-25 MCG/ACT AEPB INHALE 1 PUFF BY MOUTH  ONCE DAILY .   furosemide (LASIX) 40 MG tablet Take 1 tablet (40 mg total) by mouth daily. (Patient taking differently: Take 20-40 mg by mouth See admin instructions. Taking 20 mg daily except on a day when weight gain is noticed can take 40 mg daily)   memantine (NAMENDA) 10 MG tablet Take 1 tablet (10 mg total) by mouth 2 (two) times daily.   metoprolol succinate (TOPROL-XL) 50 MG 24 hr tablet Take 1 tablet (50 mg total) by mouth daily.   Multiple Vitamins-Minerals (MULTIVITAMIN WITH MINERALS) tablet Take 1 tablet by mouth daily.   Multiple Vitamins-Minerals (OCUVITE PO) Take 1 tablet by mouth daily.   OMEGA-3 FATTY ACIDS PO Take 500 mg by mouth daily.   sodium chloride (OCEAN) 0.65 % SOLN nasal spray Place 1 spray into both nostrils daily as needed for congestion.   warfarin (COUMADIN) 5 MG tablet TAKE 1/2 TABLET TO 1 TABLET BY MOUTH ONCE DAILY AS DIRECTED BY ANTICOAGULATION CLINIC.   No current facility-administered medications for this visit. (Other)   REVIEW OF SYSTEMS: ROS   Positive for: Cardiovascular, Eyes Negative for: Constitutional, Gastrointestinal, Neurological, Skin, Genitourinary, Musculoskeletal, HENT, Endocrine, Respiratory, Psychiatric, Allergic/Imm, Heme/Lymph Last edited by Laddie Aquaslarke, Rebecca S, COA on 01/25/2023  8:49 AM.      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 Clifton JamesMcAlhany & Dr. Laneta SimmersBartle   Severe aortic stenosis    Umbilical hernia 07/27/2012   Past Surgical History:  Procedure Laterality Date   CARDIOVERSION  10/10/2006   cataracts Bilateral    HERNIA REPAIR  09/10/2012   large umbilical hernia   INSERTION OF MESH  09/10/2012    Procedure: INSERTION OF MESH;  Surgeon: Liz MaladyBurke E Thompson, MD;  [facility];  Service: General;  Laterality: N/A;   INTRAOPERATIVE TRANSTHORACIC ECHOCARDIOGRAM N/A 10/18/2022   Procedure: INTRAOPERATIVE TRANSTHORACIC ECHOCARDIOGRAM;  Surgeon: Kathleene HazelMcAlhany, Christopher D, MD;  Location: [facility] INVASIVE CV LAB;  Service: Open Heart Surgery;  Laterality: N/A;   macular degeneration Bilateral    RIGHT HEART CATH AND CORONARY ANGIOGRAPHY N/A 09/09/2022   Procedure: RIGHT HEART CATH AND CORONARY ANGIOGRAPHY;  Surgeon: Kathleene HazelMcAlhany, Christopher D, MD;  Location: [facility] INVASIVE CV LAB;  Service: Cardiovascular;  Laterality: N/A;   TONSILLECTOMY     "maybe" (09/10/2012)   TRANSCATHETER AORTIC VALVE REPLACEMENT, TRANSFEMORAL N/A 10/18/2022   Procedure: Transcatheter Aortic Valve Replacement, Transfemoral;  Surgeon: Kathleene HazelMcAlhany, Christopher D, MD;  Location: [facility];  Service: Open Heart Surgery;  Laterality: N/A;   UMBILICAL HERNIA REPAIR  09/10/2012   Procedure: HERNIA REPAIR UMBILICAL ADULT;  Surgeon: Liz MaladyBurke E Thompson, MD;  Location: [facility] OR;  Service: General;  Laterality: N/A;   FAMILY HISTORY Family History  Problem Relation Age of Onset   Heart disease Mother    Stroke Father    SOCIAL HISTORY Social History   Tobacco Use   Smoking status: Former    Packs/day: 1.50    Years: 10.00    Additional pack years: 0.00    Total pack years: 15.00    Types: Cigarettes    Quit date: 07/27/1982    Years since quitting: 40.5   Smokeless tobacco: Never  Vaping Use   Vaping Use: Never used  Substance Use Topics   Drug use: No       OPHTHALMIC EXAM:  Base Eye Exam     Visual Acuity (Snellen - Linear)       Right Left   Dist [place] 20/70 -2 CF at 3'   Dist ph Jardine 20/50 -2 NI    Correction: Glasses         Tonometry (Tonopen, 8:45 AM)       Right Left   Pressure 15 17         Pupils       Dark Light Shape React APD   Right 2 1 Round Minimal None   Left 2 2 Round NR None         Visual  Fields (Counting fingers)       Left Right    Full Full         Extraocular Movement       Right Left    Full, Ortho Full, Ortho         Neuro/Psych     Oriented x3: Yes   Mood/Affect: Normal         Dilation     Both eyes:            Slit Lamp and Fundus Exam  External Exam       Right Left   External Normal Normal         Slit Lamp Exam       Right Left   Lids/Lashes Dermatochalasis - upper lid, Ptosis Dermatochalasis - upper lid, mild MGD   Conjunctiva/Sclera White and quiet White and quiet   Cornea 1-2+ Punctate epithelial erosions, mild arcus, well healed cataract wound, tear film debris Trace tear film debris, well healed cataract wound, arcus   Anterior Chamber deep and clear deep and clear   Iris Round and dilated round and poorly dilated   Lens PC IOL in good position, 1-2+ Posterior capsular opacification PC IOL in good position, 2-3+ Posterior capsular opacification   Anterior Vitreous mild syneresis Vitreous syneresis         Fundus Exam       Right Left   Posterior Vitreous Posterior vitreous detachment Posterior vitreous detachment, vitreous condensations   Disc 2+Pallor, Sharp rim, Compact 2+Pallor, Sharp rim   C/D Ratio 0.2 0.3   Macula Blunted foveal reflex, refractile Drusen, RPE mottling, clumping and atrophy, focal central edema, no frank heme Flat, blunted foveal reflex, refractile Drusen, RPE mottling, clumping and atrophy, central GA, no frank heme   Vessels attenuated, Tortuous attenuated, mild tortuosity   Periphery Attached, reticular degeneration, mild heme Attached, reticular degeneration, mild heme           Refraction     Wearing Rx       Sphere Cylinder Axis Add   Right -0.75 +0.75 087 +2.75   Left -0.75 +0.75 083 +2.75           IMAGING AND PROCEDURES  Imaging and Procedures for 01/25/2023  OCT, Retina - OU - Both Eyes       Right Eye Quality was good. Scan locations included subfoveal.  Central Foveal Thickness: 236. Progression has been stable. Findings include normal foveal contour, no IRF, retinal drusen , subretinal hyper-reflective material, pigment epithelial detachment, subretinal fluid, outer retinal atrophy (Patchy central ORA with PEDs, central pocket of SRF -- persistent).   Left Eye Quality was poor. Scan locations included subfoveal. Central Foveal Thickness: 178. Progression has been stable. Findings include no IRF, no SRF, abnormal foveal contour, retinal drusen , outer retinal tubulation, pigment epithelial detachment, outer retinal atrophy (Central ORA / GA with PEDs and ORT).   Notes *Images captured and stored on drive  Diagnosis / Impression:  OD: exudative ARMD - Patchy central ORA with PEDs, central pocket of SRF -- persistent OS: non exudative ARMD - Central ORA / GA with PEDs and ORT  Clinical management:  See below  Abbreviations: NFP - Normal foveal profile. CME - cystoid macular edema. PED - pigment epithelial detachment. IRF - intraretinal fluid. SRF - subretinal fluid. EZ - ellipsoid zone. ERM - epiretinal membrane. ORA - outer retinal atrophy. ORT - outer retinal tubulation. SRHM - subretinal hyper-reflective material. IRHM - intraretinal hyper-reflective material      Intravitreal Injection, Pharmacologic Agent - OD - Right Eye       Time Out 01/25/2023. 9:23 AM. Confirmed correct patient, procedure, site, and patient consented.   Anesthesia Topical anesthesia was used. Anesthetic medications included Lidocaine 2%, Proparacaine 0.5%.   Procedure Preparation included 5% betadine to ocular surface, eyelid speculum. A (32g) needle was used.   Injection: 1.25 mg Bevacizumab 1.25mg /0.30ml   Route: Intravitreal, Site: Right Eye   NDC: P3213405, Lot: [id number], Expiration date: 04/13/2023   Post-op Post injection exam found  visual acuity of at least counting fingers. The patient tolerated the procedure well. There were no  complications. The patient received written and verbal post procedure care education.            ASSESSMENT/PLAN:    ICD-10-CM   1. Exudative age-related macular degeneration of right eye with active choroidal neovascularization  H35.3211 OCT, Retina - OU - Both Eyes    Intravitreal Injection, Pharmacologic Agent - OD - Right Eye    Bevacizumab (AVASTIN) SOLN 1.25 mg    2. 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 Essential hypertension  I10     4. Hypertensive retinopathy of both eyes  H35.033     5. Pseudophakia, both eyes  Z96.1      Exudative age related macular degeneration, OD  - previous Dr. Luciana Axe pt here due to insurance  - pt reports history of injections OD w/ [name], but unable to see record of that  - here s/p IVA OD #1 (02.07.24), #2 (03.13.24)  - BCVA OD today 20/50 -- stable  - OCT OD: Patchy central ORA with PEDs and persistent central pocket of SRF at 5 wks  - recommend IVA OD #3 today, 04.17.24 w/ f/u in 5 wks again - pt wishes to proceed with injection - RBA of procedure discussed, questions answered - IVA informed consent obtained and signed, 02.07.24 (OD) - see procedure note   - f/u in 5 wks -- DFE/OCT, possible injection  2. Age related macular degeneration, non-exudative, left eye  - advanced stage w/ significant central GA  - BCVA CF 3' -- stable   - history of IVA OS w/ [name], last 05.24.21  - OCT OS shows Central ORA / GA with PEDs and ORT  - The incidence, anatomy, and pathology of dry AMD, risk of progression, and the AREDS and AREDS 2 study including smoking risks discussed with patient.  - Recommend amsler grid monitoring  - monitor  3,4. Hypertensive retinopathy OU - discussed importance of tight BP control - monitor  5. Pseudophakia OU  - s/p CE/IOL (Dr. Nile Riggs)  - IOL in good position, doing well  - monitor  Ophthalmic Meds Ordered this visit:  Meds  ordered this encounter  Medications   Bevacizumab (AVASTIN) SOLN 1.25 mg     Return in about 5 weeks (around 03/01/2023) for f/u exu ARMD OD, DFE, OCT.  There are no Patient Instructions on file for this visit.   Explained the diagnoses, plan, and follow up with the patient and they expressed understanding.  Patient expressed understanding of the importance of proper follow up care.   This document serves as a record of services personally performed by Karie Chimera, MD, PhD. It was created on their behalf by De Blanch, an ophthalmic technician. The creation of this record is the provider's dictation and/or activities during the visit.    Electronically signed by: De Blanch, OA, 01/25/23  7:37 PM  This document serves as a record of services personally performed by Karie Chimera, MD, PhD. It was created on their behalf by Glee Arvin. Manson Passey, OA an ophthalmic technician. The creation of this record is the provider's dictation and/or activities during the visit.    Electronically signed by: Glee Arvin. Kristopher Oppenheim 04.17.2024 7:37 PM  Karie Chimera, M.D., Ph.D. Diseases & Surgery of the Retina and Vitreous [facility]  I have reviewed the above documentation  for accuracy and completeness, and I agree with the above. Karie Chimera, M.D., Ph.D. 01/25/23 7:39 PM   Abbreviations: M myopia (nearsighted); A astigmatism; H hyperopia (farsighted); P presbyopia; Mrx spectacle prescription;  CTL contact lenses; OD right eye; OS left eye; OU both eyes  XT exotropia; ET esotropia; PEK punctate epithelial keratitis; PEE punctate epithelial erosions; DES dry eye syndrome; MGD meibomian gland dysfunction; ATs artificial tears; PFAT's preservative free artificial tears; NSC nuclear sclerotic cataract; PSC posterior subcapsular cataract; ERM epi-retinal membrane; PVD posterior vitreous detachment; RD retinal detachment; DM diabetes mellitus; DR diabetic retinopathy; NPDR  non-proliferative diabetic retinopathy; PDR proliferative diabetic retinopathy; CSME clinically significant macular edema; DME diabetic macular edema; dbh dot blot hemorrhages; CWS cotton wool spot; POAG primary open angle glaucoma; C/D cup-to-disc ratio; HVF humphrey visual field; GVF goldmann visual field; OCT optical coherence tomography; IOP intraocular pressure; BRVO Branch retinal vein occlusion; CRVO central retinal vein occlusion; CRAO central retinal artery occlusion; BRAO branch retinal artery occlusion; RT retinal tear; SB scleral buckle; PPV pars plana vitrectomy; VH Vitreous hemorrhage; PRP panretinal laser photocoagulation; IVK intravitreal kenalog; VMT vitreomacular traction; MH Macular hole;  NVD neovascularization of the disc; NVE neovascularization elsewhere; AREDS age related eye disease study; ARMD age related macular degeneration; POAG primary open angle glaucoma; EBMD epithelial/anterior basement membrane dystrophy; ACIOL anterior chamber intraocular lens; IOL intraocular lens; PCIOL posterior chamber intraocular lens; Phaco/IOL phacoemulsification with intraocular lens placement; PRK photorefractive keratectomy; LASIK laser assisted in situ keratomileusis; HTN hypertension; DM diabetes mellitus; COPD chronic obstructive pulmonary disease

## 2023-01-19 ENCOUNTER — Other Ambulatory Visit: Payer: Self-pay | Admitting: Nurse Practitioner

## 2023-01-19 DIAGNOSIS — J3089 Other allergic rhinitis: Secondary | ICD-10-CM

## 2023-01-24 ENCOUNTER — Ambulatory Visit: Payer: Medicare Other | Attending: Cardiology

## 2023-01-24 DIAGNOSIS — I4891 Unspecified atrial fibrillation: Secondary | ICD-10-CM

## 2023-01-24 DIAGNOSIS — I4821 Permanent atrial fibrillation: Secondary | ICD-10-CM | POA: Diagnosis not present

## 2023-01-24 DIAGNOSIS — Z7901 Long term (current) use of anticoagulants: Secondary | ICD-10-CM

## 2023-01-24 DIAGNOSIS — Z5181 Encounter for therapeutic drug level monitoring: Secondary | ICD-10-CM | POA: Diagnosis not present

## 2023-01-24 LAB — POCT INR: INR: 2.7 (ref 2.0–3.0)

## 2023-01-24 NOTE — Patient Instructions (Signed)
Description   Continue taking 1 tablet daily except 0.5 tablet on Sunday, Monday, Wednesday and Friday.  Stay consistent with greens (2 times per week)  Recheck INR 4 weeks  Coumadin Clinic [phone]

## 2023-01-25 ENCOUNTER — Ambulatory Visit (INDEPENDENT_AMBULATORY_CARE_PROVIDER_SITE_OTHER): Payer: Medicare Other | Admitting: Ophthalmology

## 2023-01-25 ENCOUNTER — Encounter (INDEPENDENT_AMBULATORY_CARE_PROVIDER_SITE_OTHER): Payer: Self-pay | Admitting: Ophthalmology

## 2023-01-25 DIAGNOSIS — I10 Essential (primary) hypertension: Secondary | ICD-10-CM

## 2023-01-25 DIAGNOSIS — H35033 Hypertensive retinopathy, bilateral: Secondary | ICD-10-CM | POA: Diagnosis not present

## 2023-01-25 DIAGNOSIS — H353124 Nonexudative age-related macular degeneration, left eye, advanced atrophic with subfoveal involvement: Secondary | ICD-10-CM

## 2023-01-25 DIAGNOSIS — H353211 Exudative age-related macular degeneration, right eye, with active choroidal neovascularization: Secondary | ICD-10-CM | POA: Diagnosis not present

## 2023-01-25 DIAGNOSIS — Z961 Presence of intraocular lens: Secondary | ICD-10-CM

## 2023-01-25 MED ORDER — BEVACIZUMAB CHEMO INJECTION 1.25MG/0.05ML SYRINGE FOR KALEIDOSCOPE
1.2500 mg | INTRAVITREAL | Status: AC | PRN
  Administered 2023-01-25: 1.25 mg via INTRAVITREAL

## 2023-02-01 ENCOUNTER — Telehealth: Payer: Self-pay | Admitting: Neurology

## 2023-02-01 ENCOUNTER — Encounter: Payer: Self-pay | Admitting: Neurology

## 2023-02-01 ENCOUNTER — Ambulatory Visit: Payer: Medicare Other | Admitting: Neurology

## 2023-02-01 VITALS — BP 132/72 | HR 58 | Ht 72.0 in | Wt 104.8 lb

## 2023-02-01 DIAGNOSIS — G301 Alzheimer's disease with late onset: Secondary | ICD-10-CM

## 2023-02-01 DIAGNOSIS — F02B18 Dementia in other diseases classified elsewhere, moderate, with other behavioral disturbance: Secondary | ICD-10-CM

## 2023-02-01 DIAGNOSIS — R443 Hallucinations, unspecified: Secondary | ICD-10-CM | POA: Diagnosis not present

## 2023-02-01 MED ORDER — QUETIAPINE FUMARATE 25 MG PO TABS: 12.5000 mg | ORAL_TABLET | Freq: Two times a day (BID) | ORAL | 0 refills | Status: DC

## 2023-02-01 MED ORDER — QUETIAPINE FUMARATE 25 MG PO TABS: 25.0000 mg | ORAL_TABLET | Freq: Every day | ORAL | 0 refills | Status: DC

## 2023-02-01 NOTE — Patient Instructions (Signed)
MRI brain without contrast to assess for strokes Continue current medication including Namenda 10 mg twice daily If symptoms get worse, trial of Seroquel 12.5 mg twice daily Continue to follow with PCP Return in 1 year or sooner if worse

## 2023-02-01 NOTE — Progress Notes (Signed)
[facility]  PATIENT: Warren Wagner. DOB: 1935/02/14  REQUESTING CLINICIAN: Mayer Masker, PA-C HISTORY FROM: Patient and 2 daughters Marlana Salvage and Maralyn Sago REASON FOR VISIT: Memory decline   HISTORICAL  CHIEF COMPLAINT:  Chief Complaint  Patient presents with   Follow-up    RM 13, [name]&JULIET (DAUGHTERS) PRESENT HALLUCINATION since heart surgery they happen daily (sees bugs/worms/blue lights)    INTERVAL HISTORY 02/01/2023:  Patient presents today for follow-up, last visit was over a year ago.  He states that he has been stable, compliant with Namenda.  In January he did have aortic valve replacement and since then he has been having hallucinations.  He is seeing bugs crawling on the floor, bugs in his food and also seeing people in his house and little children.  He is aware that he is only 1 that sees them and it bothers patient.  Family reported that otherwise he has been stable, has not had any major worsening in term of the memory.   HISTORY OF PRESENT ILLNESS:  This is a 88 year old gentleman past medical history of atrial fibrillation, aortic stenosis, hypertension, insomnia, BPH, and hearing loss who is presenting with daughters Marlana Salvage and Maralyn Sago with concern of memory.  Patient reported his memory is not what he used to be, he is more forgetful than previously.  Currently he does live with his daughters after selling his house and need help with most activities of daily living.  Per daughters, patient is more forgetful, he does not talk that much but can get irritable if daughters are trying to have a conversation with him.  He was recently admitted to the hospital for UTI but since discharge his forgetfulness has worsened, sometimes he will forget to change his pull-up or forget to put his pans back on.  He needs reminder to take a shower, He has issue with taking his medications, sometime he will forget.  Daughters feel like they have to teach him the  same thing over and over like how to get out of the car.  Family has to repeat himself themselves multiple times.  During conversation he can change topic frequently.  Daughters are helping managing his bill.  They reported agitation at time, that he is easily frustrated.  Denies any hallucinations.  Currently is not driving but he will get confused when going to familiar places.    TBI:   No past history of TBI Stroke:   no past history of stroke Seizures:   no past history of seizures Sleep:   no history of sleep apnea.   Mood:   patient denies anxiety and depression  Functional status: Dependent in most ADLs and IADLs Patient lives with daughter  Cooking: no Cleaning: no Shopping: no Bathing: need help  Toileting: need help  Driving: no, last time was 1 year due to vision loss  Bills: Daughters   Ever left the stove on by accident?: no Forget how to use items around the house?: yes  Getting lost going to familiar places?: yes Forgetting loved ones names?: no  Word finding difficulty? Yes   Sleep: very poor, will sleep most of the day and up at night    OTHER MEDICAL CONDITIONS: Atrial fibrillation, aortic stenosis, hypertension, hearing loss, insomnia, BPH    REVIEW OF SYSTEMS: Full 14 system review of systems performed and negative with exception of: as noted in the HPI   ALLERGIES: No Known Allergies  HOME MEDICATIONS: Outpatient Medications Prior to Visit  Medication Sig  Dispense Refill   acetaminophen (TYLENOL) 500 MG tablet Take 1,000 mg by mouth every 6 (six) hours as needed for moderate pain or headache.     amoxicillin (AMOXIL) 500 MG tablet Take 4 tablets (2,000 mg total) by mouth as directed. 1 hour prior to dental work including cleanings 12 tablet 12   docusate sodium (COLACE) 100 MG capsule Take 100 mg by mouth daily.     EQ ALLERGY RELIEF, CETIRIZINE, 10 MG tablet Take 1 tablet by mouth once daily 90 tablet 0   finasteride (PROSCAR) 5 MG tablet Take 1  tablet by mouth once daily 60 tablet 0   Fluticasone-Umeclidin-Vilant (TRELEGY ELLIPTA) 100-62.5-25 MCG/ACT AEPB INHALE 1 PUFF BY MOUTH ONCE DAILY . 60 each 3   furosemide (LASIX) 40 MG tablet Take 1 tablet (40 mg total) by mouth daily. (Patient taking differently: Take 20-40 mg by mouth See admin instructions. Taking 20 mg daily except on a day when weight gain is noticed can take 40 mg daily) 90 tablet 3   memantine (NAMENDA) 10 MG tablet Take 1 tablet (10 mg total) by mouth 2 (two) times daily. 60 tablet 11   metoprolol succinate (TOPROL-XL) 50 MG 24 hr tablet Take 1 tablet (50 mg total) by mouth daily. 90 tablet 1   Multiple Vitamins-Minerals (MULTIVITAMIN WITH MINERALS) tablet Take 1 tablet by mouth daily.     Multiple Vitamins-Minerals (OCUVITE PO) Take 1 tablet by mouth daily.     OMEGA-3 FATTY ACIDS PO Take 500 mg by mouth daily.     sodium chloride (OCEAN) 0.65 % SOLN nasal spray Place 1 spray into both nostrils daily as needed for congestion.     warfarin (COUMADIN) 5 MG tablet TAKE 1/2 TABLET TO 1 TABLET BY MOUTH ONCE DAILY AS DIRECTED BY ANTICOAGULATION CLINIC. 65 tablet 0   No facility-administered medications prior to visit.    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 Clifton James & Dr. Laneta Simmers   Severe aortic stenosis    Umbilical hernia 07/27/2012    PAST SURGICAL HISTORY: Past Surgical History:  Procedure Laterality Date   CARDIOVERSION  10/10/2006   cataracts Bilateral    HERNIA REPAIR  09/10/2012   large umbilical hernia   INSERTION OF MESH  09/10/2012   Procedure: INSERTION OF MESH;  Surgeon: [name], MD;  Location: [facility] OR;  Service: General;  Laterality: N/A;    INTRAOPERATIVE TRANSTHORACIC ECHOCARDIOGRAM N/A 10/18/2022   Procedure: INTRAOPERATIVE TRANSTHORACIC ECHOCARDIOGRAM;  Surgeon: Kathleene Hazel, MD;  Location: [facility] INVASIVE CV LAB;  Service: Open Heart Surgery;  Laterality: N/A;   macular degeneration Bilateral    RIGHT HEART CATH AND CORONARY ANGIOGRAPHY N/A 09/09/2022   Procedure: RIGHT HEART CATH AND CORONARY ANGIOGRAPHY;  Surgeon: Kathleene Hazel, MD;  Location: [facility] INVASIVE CV LAB;  Service: Cardiovascular;  Laterality: N/A;   TONSILLECTOMY     "maybe" (09/10/2012)   TRANSCATHETER AORTIC VALVE REPLACEMENT, TRANSFEMORAL N/A 10/18/2022   Procedure: Transcatheter Aortic Valve Replacement, Transfemoral;  Surgeon: Kathleene Hazel, MD;  Location: [facility] INVASIVE CV LAB;  Service: Open Heart Surgery;  Laterality: N/A;   UMBILICAL HERNIA REPAIR  09/10/2012   Procedure: HERNIA  REPAIR UMBILICAL ADULT;  Surgeon: [name], MD;  Location[facility];  Service: General;  Laterality: N/A;    FAMILY HISTORY: Family History  Problem Relation Age of Onset   Heart disease Mother    Stroke Father     SOCIAL HISTORY: Social History   Socioeconomic History   Marital status: Widowed    Spouse name: Not on file   Number of children: 3   Years of education: Not on file   Highest education level: Not on file  Occupational History   Occupation: [employer] doctor  Tobacco Use   Smoking status: Former    Packs/day: 1.50    Years: 10.00    Additional pack years: 0.00    Total pack years: 15.00    Types: Cigarettes    Quit date: 07/27/1982    Years since quitting: 40.5   Smokeless tobacco: Never  Vaping Use   Vaping Use: Never used  Substance and Sexual Activity   Alcohol use: Not on file   Drug use: No   Sexual activity: Not Currently    Birth control/protection: None  Other Topics Concern   Not on file  Social History Narrative   Lives with 2 daughters. Three children.    Social Determinants of Health   Financial Resource  Strain: Low Risk  (11/29/2022)   Overall Financial Resource Strain (CARDIA)    Difficulty of Paying Living Expenses: Not hard at all  Food Insecurity: No Food Insecurity (11/29/2022)   Hunger Vital Sign    Worried About Running Out of Food in the Last Year: Never true    Ran Out of Food in the Last Year: Never true  Transportation Needs: No Transportation Needs (11/29/2022)   PRAPARE - Administrator, Civil Service (Medical): No    Lack of Transportation (Non-Medical): No  Physical Activity: Insufficiently Active (11/29/2022)   Exercise Vital Sign    Days of Exercise per Week: 7 days    Minutes of Exercise per Session: 10 min  Stress: Stress Concern Present (11/29/2022)   [employer] of Occupational Health - Occupational Stress Questionnaire    Feeling of Stress : To some extent  Social Connections: Socially Isolated (11/29/2022)   Social Connection and Isolation Panel [NHANES]    Frequency of Communication with Friends and Family: Never    Frequency of Social Gatherings with Friends and Family: More than three times a week    Attends Religious Services: Never    Database administrator or Organizations: No    Attends Banker Meetings: Never    Marital Status: Widowed  Intimate Partner Violence: Not At Risk (11/29/2022)   Humiliation, Afraid, Rape, and Kick questionnaire    Fear of Current or Ex-Partner: No    Emotionally Abused: No    Physically Abused: No    Sexually Abused: No    PHYSICAL EXAM  GENERAL EXAM/CONSTITUTIONAL: Vitals:  Vitals:   02/01/23 1012  BP: 132/72  Pulse: (!) 58  Weight: 104 lb 12.8 oz (47.5 kg)  Height: 6' (1.829 m)    Body mass index is 14.21 kg/m. Wt Readings from Last 3 Encounters:  02/01/23 104 lb 12.8 oz (47.5 kg)  11/30/22 231 lb (104.8 kg)  11/29/22 225 lb 12.8 oz (102.4 kg)   Patient is in no distress; well developed, nourished and groomed; neck is supple  EYES: Visual fields full to confrontation,  Extraocular movements intacts,   MUSCULOSKELETAL: Gait, strength, tone, movements noted in Neurologic exam below  NEUROLOGIC: MENTAL STATUS:      No data to display             03/16/2022    9:43 AM  Montreal Cognitive Assessment   Visuospatial/ Executive (0/5) 3  Naming (0/3) 3  Attention: Read list of digits (0/2) 1  Attention: Read list of letters (0/1) 1  Attention: Serial 7 subtraction starting at 100 (0/3) 3  Language: Repeat phrase (0/2) 2  Language : Fluency (0/1) 0  Abstraction (0/2) 2  Delayed Recall (0/5) 0  Orientation (0/6) 4  Total 19  Adjusted Score (based on education) 19    CRANIAL NERVE:  2nd, 3rd, 4th, 6th - visual fields full to confrontation, extraocular muscles intact, no nystagmus 5th - facial sensation symmetric 7th - facial strength symmetric 8th - hearing intact 9th - palate elevates symmetrically, uvula midline 11th - shoulder shrug symmetric 12th - tongue protrusion midline  MOTOR:  normal bulk and tone, full strength in the BUE, BLE  SENSORY:  normal and symmetric to light touch  COORDINATION:  finger-nose-finger  GAIT/STATION:  Deferred, in a wheelchair    DIAGNOSTIC DATA (LABS, IMAGING, TESTING) - I reviewed patient records, labs, notes, testing and imaging myself where available.  Lab Results  Component Value Date   WBC 9.6 10/19/2022   HGB 13.6 10/19/2022   HCT 42.5 10/19/2022   MCV 92.8 10/19/2022   PLT 111 (L) 10/19/2022      Component Value Date/Time   NA 139 10/19/2022 0057   NA 143 08/23/2022 1046   K 4.4 10/19/2022 0057   CL 108 10/19/2022 0057   CO2 24 10/19/2022 0057   GLUCOSE 115 (H) 10/19/2022 0057   BUN 14 10/19/2022 0057   BUN 22 08/23/2022 1046   CREATININE 1.08 10/19/2022 0057   CALCIUM 8.3 (L) 10/19/2022 0057   PROT 7.6 10/14/2022 0945   PROT 6.9 05/04/2022 0840   ALBUMIN 4.1 10/14/2022 0945   ALBUMIN 4.4 05/04/2022 0840   AST 47 (H) 10/14/2022 0945   ALT 27 10/14/2022 0945   ALKPHOS 75  10/14/2022 0945   BILITOT 1.6 (H) 10/14/2022 0945   BILITOT 0.7 05/04/2022 0840   GFRNONAA >60 10/19/2022 0057   GFRAA 69 12/04/2019 1109   Lab Results  Component Value Date   CHOL 218 (H) 05/04/2022   HDL 58 05/04/2022   LDLCALC 147 (H) 05/04/2022   TRIG 72 05/04/2022   CHOLHDL 3.8 05/04/2022   Lab Results  Component Value Date   HGBA1C 5.6 01/06/2022   Lab Results  Component Value Date   VITAMINB12 911 03/16/2022   Lab Results  Component Value Date   TSH 1.860 02/03/2022    CT Head 02/02/22 1. No acute intracranial abnormalities. 2. Chronic small vessel ischemic disease and brain atrophy. 3. Chronic left maxillary sinus inflammation. 4. Periapical lucency involving a left upper tooth for which periapical abscess cannot be excluded.   CT Head 11/25/2022 Atrophy, chronic microvascular disease. No acute intracranial abnormality.   ASSESSMENT AND PLAN  88 y.o. year old male with history of atrial fibrillation, aortic stenosis s/p AVR, heart disease, BPH, hearing loss who is presenting for follow up for his dementia.  He is compliant with his Namenda 10 mg twice daily, but since January he did after having aortic valve replacement he has been changing hallucinations described as seeing bugs crawling on the floor, bugs in his food and seeing people including little children in his house.  This happened daily.  He did have  a head CT which showed no acute stroke but will request for MRI brain for further evaluation.  We also discussed medication including antipsychotic, discussed black box warning including increased risk of death in the elderly.  Family is reluctant to start the medication but we have agreed to give him a trial of 14 tablets 25 mg if the symptoms get worse, they can start with half tablet twice daily.  They are comfortable with plans.  I will see him in 1 year for follow-up or sooner if worse.   1. 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 Hallucination      Patient Instructions  MRI brain without contrast to assess for strokes Continue current medication including Namenda 10 mg twice daily If symptoms get worse, trial of Seroquel 12.5 mg twice daily Continue to follow with PCP Return in 1 year or sooner if worse  Orders Placed This Encounter  Procedures   MR BRAIN WO CONTRAST    Meds ordered this encounter  Medications   DISCONTD: QUEtiapine (SEROQUEL) 25 MG tablet    Sig: Take 1 tablet (25 mg total) by mouth at bedtime.    Dispense:  14 tablet    Refill:  0   QUEtiapine (SEROQUEL) 25 MG tablet    Sig: Take 0.5 tablets (12.5 mg total) by mouth 2 (two) times daily.    Dispense:  14 tablet    Refill:  0    Return in about 1 year (around 02/01/2024).  I have spent a total of 40 minutes dedicated to this patient today, preparing to see patient, performing a medically appropriate examination and evaluation, ordering tests and/or medications and procedures, and counseling and educating the patient/family/caregiver; independently interpreting result and communicating results to the family/patient/caregiver; and documenting clinical information in the electronic medical record.   Windell Norfolk, MD 02/01/2023, 10:48 AM  [facility] [address] [place], [place] [address] [phone]

## 2023-02-01 NOTE — Telephone Encounter (Signed)
[employer] medicare Berkley Harvey: [id number] exp. 02/01/23-03/02/23 sent to GI [phone]

## 2023-02-12 ENCOUNTER — Other Ambulatory Visit: Payer: Self-pay | Admitting: Cardiology

## 2023-02-12 DIAGNOSIS — I4891 Unspecified atrial fibrillation: Secondary | ICD-10-CM

## 2023-02-13 NOTE — Telephone Encounter (Signed)
Refill request for warfarin:  Last INR was 2.7 on 01/24/23 Next INR due 02/21/23 LOV was 11/30/22    Refill approved.

## 2023-02-14 ENCOUNTER — Encounter: Payer: Self-pay | Admitting: Neurology

## 2023-02-14 DIAGNOSIS — K080 Exfoliation of teeth due to systemic causes: Secondary | ICD-10-CM | POA: Diagnosis not present

## 2023-02-16 ENCOUNTER — Ambulatory Visit
Admission: RE | Admit: 2023-02-16 | Discharge: 2023-02-16 | Disposition: A | Discharge status: Home / Self Care | Payer: Medicare Other | Source: Ambulatory Visit | Attending: Neurology | Admitting: Neurology

## 2023-02-16 DIAGNOSIS — F02B0 Dementia in other diseases classified elsewhere, moderate, without behavioral disturbance, psychotic disturbance, mood disturbance, and anxiety: Secondary | ICD-10-CM | POA: Diagnosis not present

## 2023-02-16 DIAGNOSIS — G301 Alzheimer's disease with late onset: Secondary | ICD-10-CM

## 2023-02-16 DIAGNOSIS — R443 Hallucinations, unspecified: Secondary | ICD-10-CM | POA: Diagnosis not present

## 2023-02-21 ENCOUNTER — Ambulatory Visit: Payer: Medicare Other | Attending: Cardiology

## 2023-02-21 DIAGNOSIS — Z5181 Encounter for therapeutic drug level monitoring: Secondary | ICD-10-CM

## 2023-02-21 DIAGNOSIS — I4891 Unspecified atrial fibrillation: Secondary | ICD-10-CM | POA: Diagnosis not present

## 2023-02-21 LAB — POCT INR: INR: 2.6 (ref 2.0–3.0)

## 2023-02-21 NOTE — Patient Instructions (Signed)
Description   Continue taking 1 tablet daily except 0.5 tablet on Sunday, Monday, Wednesday and Friday.  Stay consistent with greens (2 times per week)  Recheck INR 5 weeks  [facility] [phone]

## 2023-02-22 DIAGNOSIS — L820 Inflamed seborrheic keratosis: Secondary | ICD-10-CM | POA: Diagnosis not present

## 2023-02-22 DIAGNOSIS — D485 Neoplasm of uncertain behavior of skin: Secondary | ICD-10-CM | POA: Diagnosis not present

## 2023-02-22 DIAGNOSIS — L821 Other seborrheic keratosis: Secondary | ICD-10-CM | POA: Diagnosis not present

## 2023-02-22 DIAGNOSIS — C44329 Squamous cell carcinoma of skin of other parts of face: Secondary | ICD-10-CM | POA: Diagnosis not present

## 2023-02-27 ENCOUNTER — Other Ambulatory Visit: Payer: Self-pay | Admitting: Nurse Practitioner

## 2023-03-01 ENCOUNTER — Encounter (INDEPENDENT_AMBULATORY_CARE_PROVIDER_SITE_OTHER): Payer: Medicare Other | Admitting: Ophthalmology

## 2023-03-01 DIAGNOSIS — H353124 Nonexudative age-related macular degeneration, left eye, advanced atrophic with subfoveal involvement: Secondary | ICD-10-CM

## 2023-03-01 DIAGNOSIS — Z961 Presence of intraocular lens: Secondary | ICD-10-CM

## 2023-03-01 DIAGNOSIS — H35033 Hypertensive retinopathy, bilateral: Secondary | ICD-10-CM

## 2023-03-01 DIAGNOSIS — H353211 Exudative age-related macular degeneration, right eye, with active choroidal neovascularization: Secondary | ICD-10-CM

## 2023-03-01 DIAGNOSIS — I10 Essential (primary) hypertension: Secondary | ICD-10-CM

## 2023-03-09 NOTE — Progress Notes (Signed)
[facility] - Clinic Note  03/10/2023     CHIEF COMPLAINT Patient presents for Retina Follow Up   HISTORY OF PRESENT ILLNESS: Warren Wagner. is a 88 y.o. male who presents to the clinic today for:   HPI     Retina Follow Up   Patient presents with  Wet AMD.  In right eye.  This started 5 weeks ago.  Duration of 5 weeks.  Since onset it is stable.  I, the attending physician,  performed the HPI with the patient and updated documentation appropriately.        Comments   5 week retina follow up AMD and IVA OD pt is reporting no vision changes noticed he has seen some floaters but denies flashes of light       Last edited by Rennis Chris, MD on 03/10/2023 12:16 PM.     Pts eye has been running mucus for the past month, he has been using AT's PRN  Referring physician: Melida Quitter, PA [address] [place],  [place] [address]  HISTORICAL INFORMATION:   Selected notes from the MEDICAL RECORD NUMBER [name] transferring care due to insurance LEE: 03.27.23 [BCVA 20/40 OD, CF OS] Ocular Hx- ex ARMD OU; last injection was IVA OS on 05.24.21  PMH-    CURRENT MEDICATIONS: No current outpatient medications on file. (Ophthalmic Drugs)   No current facility-administered medications for this visit. (Ophthalmic Drugs)   Current Outpatient Medications (Other)  Medication Sig   acetaminophen (TYLENOL) 500 MG tablet Take 1,000 mg by mouth every 6 (six) hours as needed for moderate pain or headache.   amoxicillin (AMOXIL) 500 MG tablet Take 4 tablets (2,000 mg total) by mouth as directed. 1 hour prior to dental work including cleanings   docusate sodium (COLACE) 100 MG capsule Take 100 mg by mouth daily.   EQ ALLERGY RELIEF, CETIRIZINE, 10 MG tablet Take 1 tablet by mouth once daily   finasteride (PROSCAR) 5 MG tablet Take 1 tablet by mouth once daily   Fluticasone-Umeclidin-Vilant (TRELEGY ELLIPTA) 100-62.5-25 MCG/ACT AEPB INHALE 1 PUFF BY  MOUTH ONCE DAILY .   furosemide (LASIX) 40 MG tablet Take 1 tablet (40 mg total) by mouth daily. (Patient taking differently: Take 20-40 mg by mouth See admin instructions. Taking 20 mg daily except on a day when weight gain is noticed can take 40 mg daily)   memantine (NAMENDA) 10 MG tablet Take 1 tablet (10 mg total) by mouth 2 (two) times daily.   metoprolol succinate (TOPROL-XL) 50 MG 24 hr tablet Take 1 tablet (50 mg total) by mouth daily.   Multiple Vitamins-Minerals (MULTIVITAMIN WITH MINERALS) tablet Take 1 tablet by mouth daily.   Multiple Vitamins-Minerals (OCUVITE PO) Take 1 tablet by mouth daily.   OMEGA-3 FATTY ACIDS PO Take 500 mg by mouth daily.   QUEtiapine (SEROQUEL) 25 MG tablet Take 0.5 tablets (12.5 mg total) by mouth 2 (two) times daily.   sodium chloride (OCEAN) 0.65 % SOLN nasal spray Place 1 spray into both nostrils daily as needed for congestion.   warfarin (COUMADIN) 5 MG tablet TAKE 1/2 TO 1 (ONE-HALF TO ONE) TABLET BY MOUTH ONCE DAILY AS DIRECTED BY  ANTICOAGULATION  CLINIC.   No current facility-administered medications for this visit. (Other)   REVIEW OF SYSTEMS: ROS   Positive for: Cardiovascular, Eyes Negative for: Constitutional, Gastrointestinal, Neurological, Skin, Genitourinary, Musculoskeletal, HENT, Endocrine, Respiratory, Psychiatric, Allergic/Imm, Heme/Lymph Last edited by Etheleen Mayhew,  COT on 03/10/2023  8:53 AM.       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 Clifton James & Dr. Laneta Simmers   Severe aortic stenosis    Umbilical hernia 07/27/2012   Past Surgical History:  Procedure Laterality Date    CARDIOVERSION  10/10/2006   cataracts Bilateral    HERNIA REPAIR  09/10/2012   large umbilical hernia   INSERTION OF MESH  09/10/2012   Procedure: INSERTION OF MESH;  Surgeon: [name], MD;  Location: [facility] OR;  Service: General;  Laterality: N/A;   INTRAOPERATIVE TRANSTHORACIC ECHOCARDIOGRAM N/A 10/18/2022   Procedure: INTRAOPERATIVE TRANSTHORACIC ECHOCARDIOGRAM;  Surgeon: Kathleene Hazel, MD;  Location: [facility] INVASIVE CV LAB;  Service: Open Heart Surgery;  Laterality: N/A;   macular degeneration Bilateral    RIGHT HEART CATH AND CORONARY ANGIOGRAPHY N/A 09/09/2022   Procedure: RIGHT HEART CATH AND CORONARY ANGIOGRAPHY;  Surgeon: Kathleene Hazel, MD;  Location: [facility] INVASIVE CV LAB;  Service: Cardiovascular;  Laterality: N/A;   TONSILLECTOMY     "maybe" (09/10/2012)   TRANSCATHETER AORTIC VALVE REPLACEMENT, TRANSFEMORAL N/A 10/18/2022   Procedure: Transcatheter Aortic Valve Replacement, Transfemoral;  Surgeon: Kathleene Hazel, MD;  Location: [facility] INVASIVE CV LAB;  Service: Open Heart Surgery;  Laterality: N/A;   UMBILICAL HERNIA REPAIR  09/10/2012   Procedure: HERNIA REPAIR UMBILICAL ADULT;  Surgeon: [name], MD;  Location: [facility] OR;  Service: General;  Laterality: N/A;   FAMILY HISTORY Family History  Problem Relation Age of Onset   Heart disease Mother    Stroke Father    SOCIAL HISTORY Social History   Tobacco Use   Smoking status: Former    Packs/day: 1.50    Years: 10.00    Additional pack years: 0.00    Total pack years: 15.00    Types: Cigarettes    Quit date: 07/27/1982    Years since quitting: 40.6   Smokeless tobacco: Never  Vaping Use   Vaping Use: Never used  Substance Use Topics   Drug use: No       OPHTHALMIC EXAM:  Base Eye Exam     Visual Acuity (Snellen - Linear)       Right Left   Dist [place] 20/100 CF at 3'   Dist ph [place] 20/80          Tonometry (Tonopen, 9:01 AM)       Right Left   Pressure 15 16         Pupils        Pupils Dark Light Shape React APD   Right PERRL 2 1 Round Brisk None   Left PERRL 2 2 Round Brisk None         Visual Fields       Left Right    Full Full         Extraocular Movement       Right Left    Full, Ortho Full, Ortho         Neuro/Psych     Oriented x3: Yes   Mood/Affect: Normal  Dilation     Both eyes: 2.5% Phenylephrine @ 9:01 AM           Slit Lamp and Fundus Exam     External Exam       Right Left   External Normal Normal         Slit Lamp Exam       Right Left   Lids/Lashes Dermatochalasis - upper lid, Ptosis Dermatochalasis - upper lid, mild MGD   Conjunctiva/Sclera White and quiet White and quiet   Cornea 1-2+ Punctate epithelial erosions, mild arcus, well healed cataract wound, tear film debris Trace tear film debris, well healed cataract wound, arcus   Anterior Chamber deep and clear deep and clear   Iris Round and dilated round and poorly dilated   Lens PC IOL in good position, 1-2+ Posterior capsular opacification PC IOL in good position, 2-3+ Posterior capsular opacification   Anterior Vitreous mild syneresis Vitreous syneresis         Fundus Exam       Right Left   Posterior Vitreous Posterior vitreous detachment Posterior vitreous detachment, vitreous condensations   Disc 2+Pallor, Sharp rim, Compact 2+Pallor, Sharp rim   C/D Ratio 0.2 0.3   Macula Blunted foveal reflex, refractile Drusen, RPE mottling, clumping and atrophy, focal central edema, no frank heme Flat, blunted foveal reflex, refractile Drusen, RPE mottling, clumping and atrophy, central GA, no frank heme   Vessels attenuated, Tortuous attenuated, mild tortuosity   Periphery Attached, reticular degeneration, mild heme Attached, reticular degeneration, mild heme           Refraction     Wearing Rx       Sphere Cylinder Axis Add   Right -0.75 +0.75 087 +2.75   Left -0.75 +0.75 083 +2.75           IMAGING AND PROCEDURES   Imaging and Procedures for 03/10/2023  OCT, Retina - OU - Both Eyes       Right Eye Quality was good. Scan locations included subfoveal. Central Foveal Thickness: 234. Progression has been stable. Findings include normal foveal contour, no IRF, retinal drusen , subretinal hyper-reflective material, pigment epithelial detachment, subretinal fluid, outer retinal atrophy (Patchy central ORA with PEDs, central pocket of SRF -- persistent).   Left Eye Quality was poor. Scan locations included subfoveal. Central Foveal Thickness: 170. Progression has been stable. Findings include no IRF, no SRF, abnormal foveal contour, retinal drusen , outer retinal tubulation, pigment epithelial detachment, outer retinal atrophy (Central ORA / GA with PEDs and ORT).   Notes *Images captured and stored on drive  Diagnosis / Impression:  OD: exudative ARMD - Patchy central ORA with PEDs, central pocket of SRF -- persistent OS: non exudative ARMD - Central ORA / GA with PEDs and ORT  Clinical management:  See below  Abbreviations: NFP - Normal foveal profile. CME - cystoid macular edema. PED - pigment epithelial detachment. IRF - intraretinal fluid. SRF - subretinal fluid. EZ - ellipsoid zone. ERM - epiretinal membrane. ORA - outer retinal atrophy. ORT - outer retinal tubulation. SRHM - subretinal hyper-reflective material. IRHM - intraretinal hyper-reflective material      Intravitreal Injection, Pharmacologic Agent - OD - Right Eye       Time Out 03/10/2023. 10:15 AM. Confirmed correct patient, procedure, site, and patient consented.   Anesthesia Topical anesthesia was used. Anesthetic medications included Lidocaine 2%, Proparacaine 0.5%.   Procedure Preparation included 5% betadine to ocular surface, eyelid speculum. A (32g) needle was  used.   Injection: 1.25 mg Bevacizumab 1.25mg /0.74ml   Route: Intravitreal, Site: Right Eye   NDC: [id number], Lot: [id number], Expiration date: 06/10/2023    Post-op Post injection exam found visual acuity of at least counting fingers. The patient tolerated the procedure well. There were no complications. The patient received written and verbal post procedure care education.            ASSESSMENT/PLAN:    ICD-10-CM   1. Exudative age-related macular degeneration of right eye with active choroidal neovascularization (HCC)  H35.3211 OCT, Retina - OU - Both Eyes    Intravitreal Injection, Pharmacologic Agent - OD - Right Eye    Bevacizumab (AVASTIN) SOLN 1.25 mg    2. 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 Essential hypertension  I10     4. Hypertensive retinopathy of both eyes  H35.033     5. Pseudophakia, both eyes  Z96.1       Exudative age related macular degeneration, OD  - previous Dr. Luciana Axe pt here due to insurance  - pt reports history of injections OD w/ [name], but unable to see record of that  - here s/p IVA OD #1 (02.07.24), #2 (03.13.24) #3 (04.17.24)  - BCVA OD today 20/80 -- decreased  - OCT OD: Patchy central ORA with PEDs and persistent central pocket of SRF at 6 wks  **discussed decreased efficacy / resistance to Avastin and potential benefit of switching medication**  - recommend IVA OD #4 today, 05.31.24 w/ f/u up back to 4 wks - pt wishes to proceed with injection - RBA of procedure discussed, questions answered - IVA informed consent obtained and signed, 02.07.24 (OD) - see procedure note  - will check authorization for Eylea  - f/u in 4 wks -- DFE/OCT, possible injection  2. Age related macular degeneration, non-exudative, left eye  - advanced stage w/ significant central GA  - BCVA CF 3' -- stable   - history of IVA OS w/ [name], last 05.24.21  - OCT OS shows Central ORA / GA with PEDs and ORT  - The incidence, anatomy, and pathology of dry AMD, risk of progression, and the AREDS and AREDS 2 study including smoking risks  discussed with patient.  - Recommend amsler grid monitoring  - monitor  3,4. Hypertensive retinopathy OU - discussed importance of tight BP control - monitor  5. Pseudophakia OU  - s/p CE/IOL (Dr. Nile Riggs)  - IOL in good position, doing well  - monitor  Ophthalmic Meds Ordered this visit:  Meds ordered this encounter  Medications   Bevacizumab (AVASTIN) SOLN 1.25 mg     Return in about 4 weeks (around 04/07/2023) for f/u exu ARMD OD, DFE, OCT.  There are no Patient Instructions on file for this visit.   This document serves as a record of services personally performed by Karie Chimera, MD, PhD. It was created on their behalf by Berlin Hun COT, an ophthalmic technician. The creation of this record is the provider's dictation and/or activities during the visit.    Electronically signed by: Berlin Hun COT 05.30.202412:17 PM  This document serves as a record of services personally performed by Karie Chimera, MD, PhD. It was created on their behalf by Glee Arvin. Manson Passey, OA an ophthalmic technician. The creation of this record is the provider's dictation and/or activities during the visit.    Electronically signed by: Glee Arvin. Manson Passey, OA 05.31.2024 12:17 PM  Karie Chimera, M.D., Ph.D. Diseases & Surgery of the Retina and Vitreous [facility] 03/10/2023   I have reviewed the above documentation for accuracy and completeness, and I agree with the above. Karie Chimera, M.D., Ph.D. 03/10/23 12:18 PM  Abbreviations: M myopia (nearsighted); A astigmatism; H hyperopia (farsighted); P presbyopia; Mrx spectacle prescription;  CTL contact lenses; OD right eye; OS left eye; OU both eyes  XT exotropia; ET esotropia; PEK punctate epithelial keratitis; PEE punctate epithelial erosions; DES dry eye syndrome; MGD meibomian gland dysfunction; ATs artificial tears; PFAT's preservative free artificial tears; NSC nuclear sclerotic cataract; PSC posterior  subcapsular cataract; ERM epi-retinal membrane; PVD posterior vitreous detachment; RD retinal detachment; DM diabetes mellitus; DR diabetic retinopathy; NPDR non-proliferative diabetic retinopathy; PDR proliferative diabetic retinopathy; CSME clinically significant macular edema; DME diabetic macular edema; dbh dot blot hemorrhages; CWS cotton wool spot; POAG primary open angle glaucoma; C/D cup-to-disc ratio; HVF humphrey visual field; GVF goldmann visual field; OCT optical coherence tomography; IOP intraocular pressure; BRVO Branch retinal vein occlusion; CRVO central retinal vein occlusion; CRAO central retinal artery occlusion; BRAO branch retinal artery occlusion; RT retinal tear; SB scleral buckle; PPV pars plana vitrectomy; VH Vitreous hemorrhage; PRP panretinal laser photocoagulation; IVK intravitreal kenalog; VMT vitreomacular traction; MH Macular hole;  NVD neovascularization of the disc; NVE neovascularization elsewhere; AREDS age related eye disease study; ARMD age related macular degeneration; POAG primary open angle glaucoma; EBMD epithelial/anterior basement membrane dystrophy; ACIOL anterior chamber intraocular lens; IOL intraocular lens; PCIOL posterior chamber intraocular lens; Phaco/IOL phacoemulsification with intraocular lens placement; PRK photorefractive keratectomy; LASIK laser assisted in situ keratomileusis; HTN hypertension; DM diabetes mellitus; COPD chronic obstructive pulmonary disease

## 2023-03-10 ENCOUNTER — Encounter (INDEPENDENT_AMBULATORY_CARE_PROVIDER_SITE_OTHER): Payer: Self-pay | Admitting: Ophthalmology

## 2023-03-10 ENCOUNTER — Ambulatory Visit (INDEPENDENT_AMBULATORY_CARE_PROVIDER_SITE_OTHER): Payer: Medicare Other | Admitting: Ophthalmology

## 2023-03-10 DIAGNOSIS — H353124 Nonexudative age-related macular degeneration, left eye, advanced atrophic with subfoveal involvement: Secondary | ICD-10-CM

## 2023-03-10 DIAGNOSIS — H353211 Exudative age-related macular degeneration, right eye, with active choroidal neovascularization: Secondary | ICD-10-CM | POA: Diagnosis not present

## 2023-03-10 DIAGNOSIS — I10 Essential (primary) hypertension: Secondary | ICD-10-CM | POA: Diagnosis not present

## 2023-03-10 DIAGNOSIS — H35033 Hypertensive retinopathy, bilateral: Secondary | ICD-10-CM

## 2023-03-10 DIAGNOSIS — Z961 Presence of intraocular lens: Secondary | ICD-10-CM

## 2023-03-10 MED ORDER — BEVACIZUMAB CHEMO INJECTION 1.25MG/0.05ML SYRINGE FOR KALEIDOSCOPE
1.2500 mg | INTRAVITREAL | Status: AC | PRN
Start: 2023-03-10 — End: 2023-03-10
  Administered 2023-03-10: 1.25 mg via INTRAVITREAL

## 2023-03-20 ENCOUNTER — Ambulatory Visit: Payer: Medicare Other | Admitting: Neurology

## 2023-03-25 DIAGNOSIS — I340 Nonrheumatic mitral (valve) insufficiency: Secondary | ICD-10-CM | POA: Insufficient documentation

## 2023-03-25 NOTE — Progress Notes (Unsigned)
Cardiology Office Note:   Date:  03/27/2023  ID:  Warren Knock., DOB 06/01/1935, MRN [id number] PCP: Melida Quitter, PA  [facility] Providers Cardiologist:  [name], MD    History of Present Illness:   Warren Senn. is a 88 y.o. male who presents for evaluation of lower extremity swelling.  He has atrial fib and has been on warfarin.   At the last visit I ordered an echo.  LV function is OK.   There were no significant valvular abnormalities.  He has aortic root enlargement.  He was in the hospital in April 2023 with dizziness and orthostatic hypotension.    He had rhabdo and dehydration.  He had a UTI.  He was found to have severe AS.   This had progressed compared with previous studies.   I sent him for follow up echo earlier this month.  The gradient was not changed but this was felt to be severe and CT for calcium level on the aortic valve was suggested.    He saw Dr. Clifton Keanthony Poole since I last saw him and he does have severe MR but given his advanced age and comorbidities as well as some altered mental status this is can be managed conservatively.  There is no plan for TEE or further intervention.  He is not having any acute shortness of breath, PND or orthopnea.  He had no new palpitations, presyncope or syncope.  He gets around his house a little bit with a walker.  He uses a wheelchair when he goes out.  Keep his feet down on the ground a lot and does have some lower extremity swelling.  He does not want to keep them elevated.  He is not having chest pressure, neck discomfort.  He has had no fevers or chills.  He is here with his daughter.  ROS: As stated in the HPI and negative for all other systems.  Studies Reviewed:    EKG:  NA    Risk Assessment/Calculations:    CHA2DS2-VASc Score = 3   This indicates a 3.2% annual risk of stroke. The patient's score is based upon: CHF History: 0 HTN History: 1 Diabetes History: 0 Stroke History: 0 Vascular  Disease History: 0 Age Score: 2 Gender Score: 0            Physical Exam:   VS:  BP (!) 143/79 (BP Location: Left Arm, Patient Position: Sitting, Cuff Size: Normal)   Pulse (!) 59   Ht 6' (1.829 m)   Wt 227 lb 3.2 oz (103.1 kg)   SpO2 96%   BMI 30.81 kg/m    Wt Readings from Last 3 Encounters:  03/27/23 227 lb 3.2 oz (103.1 kg)  02/01/23 104 lb 12.8 oz (47.5 kg)  11/30/22 231 lb (104.8 kg)     GEN: Well nourished, well developed in no acute distress NECK: No JVD; No carotid bruits CARDIAC: RRR, apical systolic murmur radiating slightly to the axilla, no diastolic murmurs, rubs, gallops RESPIRATORY:  Clear to auscultation without rales, wheezing or rhonchi  ABDOMEN: Soft, non-tender, non-distended EXTREMITIES:  No edema; No deformity   ASSESSMENT AND PLAN:   Severe AS s/p TAVR: He is up-to-date with follow-up and we are going to manage this conservatively going forward.  No further intervention is planned.  I will consider follow-up echo in 2025.    I reviewed the [facility] notes.   Severe mitral regurgitation:   We are going to manage  this conservatively.  No change in therapy.  Permanent afib: He tolerates this and had his anticoagulation check today.  HTN: BP mildly elevated but this is unusual.  No change in therapy.    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  However, he would not be a candidate for repair.  No further imaging is indicated.  Visual disturbance/hallucinations: He still sees bugs occasionally in his house but this is manageable and he has had some prescription given for this but his daughter really does not want to start it.      Follow up APP in this office in six months.   Signed, [name], MD

## 2023-03-27 ENCOUNTER — Ambulatory Visit: Payer: Medicare Other

## 2023-03-27 ENCOUNTER — Encounter: Payer: Self-pay | Admitting: Cardiology

## 2023-03-27 ENCOUNTER — Ambulatory Visit: Payer: Medicare Other | Attending: Cardiology | Admitting: Cardiology

## 2023-03-27 VITALS — BP 143/79 | HR 59 | Ht 72.0 in | Wt 227.2 lb

## 2023-03-27 DIAGNOSIS — I4821 Permanent atrial fibrillation: Secondary | ICD-10-CM | POA: Diagnosis not present

## 2023-03-27 DIAGNOSIS — I340 Nonrheumatic mitral (valve) insufficiency: Secondary | ICD-10-CM | POA: Diagnosis not present

## 2023-03-27 DIAGNOSIS — I4891 Unspecified atrial fibrillation: Secondary | ICD-10-CM | POA: Diagnosis not present

## 2023-03-27 DIAGNOSIS — Z952 Presence of prosthetic heart valve: Secondary | ICD-10-CM

## 2023-03-27 DIAGNOSIS — Z7901 Long term (current) use of anticoagulants: Secondary | ICD-10-CM

## 2023-03-27 DIAGNOSIS — Z5181 Encounter for therapeutic drug level monitoring: Secondary | ICD-10-CM | POA: Diagnosis not present

## 2023-03-27 DIAGNOSIS — I7121 Aneurysm of the ascending aorta, without rupture: Secondary | ICD-10-CM

## 2023-03-27 LAB — POCT INR: INR: 1.4 — AB (ref 2.0–3.0)

## 2023-03-27 NOTE — Patient Instructions (Signed)
    Follow-Up: At [facility], you and your health needs are our priority.  As part of our continuing mission to provide you with exceptional heart care, we have created designated Provider Care Teams.  These Care Teams include your primary Cardiologist (physician) and Advanced Practice Providers (APPs -  Physician Assistants and Nurse Practitioners) who all work together to provide you with the care you need, when you need it.  We recommend signing up for the patient portal called "MyChart".  Sign up information is provided on this After Visit Summary.  MyChart is used to connect with patients for Virtual Visits (Telemedicine).  Patients are able to view lab/test results, encounter notes, upcoming appointments, etc.  Non-urgent messages can be sent to your provider as well.   To learn more about what you can do with MyChart, go to [email or web address].    Your next appointment:   6 month(s)  Provider:   ANY APP   

## 2023-03-27 NOTE — Patient Instructions (Signed)
Description   Take 1 tablet today and 1.5 tablets tomorrow and then continue taking 1 tablet daily except 0.5 tablet on Sunday, Monday, Wednesday and Friday.  Stay consistent with greens (2 times per week)  Recheck INR 3 weeks  Coumadin Clinic [phone]

## 2023-04-05 NOTE — Progress Notes (Signed)
[facility] - Clinic Note  04/07/2023     CHIEF COMPLAINT Patient presents for Retina Follow Up   HISTORY OF PRESENT ILLNESS: Warren Wagner. is a 88 y.o. male who presents to the clinic today for:   HPI     Retina Follow Up   Patient presents with  Wet AMD.  In right eye.  This started weeks ago.  Duration of 4 weeks.  Since onset it is stable.  I, the attending physician,  performed the HPI with the patient and updated documentation appropriately.        Comments   Patient feels that the vision is the same. He is seeing floaters. He is not using eye drops.       Last edited by Rennis Chris, MD on 04/07/2023 12:16 PM.       Referring physician: Melida Quitter, PA [address] [place],  [place] [address]  HISTORICAL INFORMATION:   Selected notes from the MEDICAL RECORD NUMBER [name] transferring care due to insurance LEE: 03.27.23 [BCVA 20/40 OD, CF OS] Ocular Hx- ex ARMD OU; last injection was IVA OS on 05.24.21  PMH-    CURRENT MEDICATIONS: No current outpatient medications on file. (Ophthalmic Drugs)   No current facility-administered medications for this visit. (Ophthalmic Drugs)   Current Outpatient Medications (Other)  Medication Sig   acetaminophen (TYLENOL) 500 MG tablet Take 1,000 mg by mouth every 6 (six) hours as needed for moderate pain or headache.   amoxicillin (AMOXIL) 500 MG tablet Take 4 tablets (2,000 mg total) by mouth as directed. 1 hour prior to dental work including cleanings   docusate sodium (COLACE) 100 MG capsule Take 100 mg by mouth daily.   EQ ALLERGY RELIEF, CETIRIZINE, 10 MG tablet Take 1 tablet by mouth once daily   finasteride (PROSCAR) 5 MG tablet Take 1 tablet by mouth once daily   Fluticasone-Umeclidin-Vilant (TRELEGY ELLIPTA) 100-62.5-25 MCG/ACT AEPB INHALE 1 PUFF BY MOUTH ONCE DAILY .   furosemide (LASIX) 40 MG tablet Take 1 tablet (40 mg total) by mouth daily. (Patient taking  differently: Take 20-40 mg by mouth See admin instructions. Taking 20 mg daily except on a day when weight gain is noticed can take 40 mg daily)   metoprolol succinate (TOPROL-XL) 50 MG 24 hr tablet Take 1 tablet (50 mg total) by mouth daily.   Multiple Vitamins-Minerals (MULTIVITAMIN WITH MINERALS) tablet Take 1 tablet by mouth daily.   Multiple Vitamins-Minerals (OCUVITE PO) Take 1 tablet by mouth daily.   OMEGA-3 FATTY ACIDS PO Take 500 mg by mouth daily.   QUEtiapine (SEROQUEL) 25 MG tablet Take 0.5 tablets (12.5 mg total) by mouth 2 (two) times daily.   sodium chloride (OCEAN) 0.65 % SOLN nasal spray Place 1 spray into both nostrils daily as needed for congestion.   warfarin (COUMADIN) 5 MG tablet TAKE 1/2 TO 1 (ONE-HALF TO ONE) TABLET BY MOUTH ONCE DAILY AS DIRECTED BY  ANTICOAGULATION  CLINIC.   memantine (NAMENDA) 10 MG tablet Take 1 tablet (10 mg total) by mouth 2 (two) times daily.   No current facility-administered medications for this visit. (Other)   REVIEW OF SYSTEMS: ROS   Positive for: Cardiovascular, Eyes Negative for: Constitutional, Gastrointestinal, Neurological, Skin, Genitourinary, Musculoskeletal, HENT, Endocrine, Respiratory, Psychiatric, Allergic/Imm, Heme/Lymph Last edited by Charlette Caffey, COT on 04/07/2023  9:12 AM.     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 Clifton James & Dr. Laneta Simmers   Severe aortic stenosis    Umbilical hernia 07/27/2012   Past Surgical History:  Procedure Laterality Date   CARDIOVERSION  10/10/2006   cataracts Bilateral    HERNIA REPAIR  09/10/2012   large umbilical hernia   INSERTION OF  MESH  09/10/2012   Procedure: INSERTION OF MESH;  Surgeon: [name], MD;  Location: [facility];  Service: General;  Laterality: N/A;   INTRAOPERATIVE TRANSTHORACIC ECHOCARDIOGRAM N/A 10/18/2022   Procedure: INTRAOPERATIVE TRANSTHORACIC ECHOCARDIOGRAM;  Surgeon: Kathleene Hazel, MD;  Location: [facility] INVASIVE CV LAB;  Service: Open Heart Surgery;  Laterality: N/A;   macular degeneration Bilateral    RIGHT HEART CATH AND CORONARY ANGIOGRAPHY N/A 09/09/2022   Procedure: RIGHT HEART CATH AND CORONARY ANGIOGRAPHY;  Surgeon: Kathleene Hazel, MD;  Location: [facility] INVASIVE CV LAB;  Service: Cardiovascular;  Laterality: N/A;   TONSILLECTOMY     "maybe" (09/10/2012)   TRANSCATHETER AORTIC VALVE REPLACEMENT, TRANSFEMORAL N/A 10/18/2022   Procedure: Transcatheter Aortic Valve Replacement, Transfemoral;  Surgeon: Kathleene Hazel, MD;  Location: [facility] INVASIVE CV LAB;  Service: Open Heart Surgery;  Laterality: N/A;   UMBILICAL HERNIA REPAIR  09/10/2012   Procedure: HERNIA REPAIR UMBILICAL ADULT;  Surgeon: [name], MD;  Location: [facility] OR;  Service: General;  Laterality: N/A;   FAMILY HISTORY Family History  Problem Relation Age of Onset   Heart disease Mother    Stroke Father    SOCIAL HISTORY Social History   Tobacco Use   Smoking status: Former    Packs/day: 1.50    Years: 10.00    Additional pack years: 0.00    Total pack years: 15.00    Types: Cigarettes    Quit date: 07/27/1982    Years since quitting: 40.7   Smokeless tobacco: Never  Vaping Use   Vaping Use: Never used  Substance Use Topics   Drug use: No       OPHTHALMIC EXAM:  Base Eye Exam     Visual Acuity (Snellen - Linear)       Right Left   Dist cc 20/100 CF at 3'   Dist ph cc 20/70 +2 NI    Correction: Glasses         Tonometry (Tonopen, 9:18 AM)       Right Left   Pressure 14 14         Pupils       Dark Light Shape React APD   Right 2 1 Round Slow None   Left 2 1 Round Slow None          Visual Fields       Left Right    Full Full         Extraocular Movement       Right Left    Full, Ortho Full, Ortho         Neuro/Psych     Oriented x3: Yes   Mood/Affect: Normal         Dilation     Both eyes: 1.0% Mydriacyl, 2.5% Phenylephrine @ 9:13 AM  Slit Lamp and Fundus Exam     External Exam       Right Left   External Normal Normal         Slit Lamp Exam       Right Left   Lids/Lashes Dermatochalasis - upper lid, Ptosis Dermatochalasis - upper lid, mild MGD   Conjunctiva/Sclera White and quiet White and quiet   Cornea 1-2+ Punctate epithelial erosions, mild arcus, well healed cataract wound, tear film debris Trace tear film debris, well healed cataract wound, arcus   Anterior Chamber deep and clear deep and clear   Iris Round and dilated round and poorly dilated   Lens PC IOL in good position, 1-2+ Posterior capsular opacification PC IOL in good position, 2-3+ Posterior capsular opacification   Anterior Vitreous mild syneresis Vitreous syneresis         Fundus Exam       Right Left   Posterior Vitreous Posterior vitreous detachment Posterior vitreous detachment, vitreous condensations   Disc 2+Pallor, Sharp rim, Compact 2+Pallor, Sharp rim   C/D Ratio 0.2 0.3   Macula Blunted foveal reflex, refractile Drusen, RPE mottling, clumping and atrophy, focal central edema, no frank heme Flat, blunted foveal reflex, refractile Drusen, RPE mottling, clumping and atrophy, central GA, no frank heme   Vessels attenuated, Tortuous attenuated, mild tortuosity   Periphery Attached, reticular degeneration, mild heme Attached, reticular degeneration, mild heme           Refraction     Wearing Rx       Sphere Cylinder Axis Add   Right -0.75 +0.75 087 +2.75   Left -0.75 +0.75 083 +2.75    Age: 36yrs           IMAGING AND PROCEDURES  Imaging and Procedures for 04/07/2023  OCT, Retina - OU - Both Eyes       Right  Eye Quality was good. Scan locations included subfoveal. Central Foveal Thickness: 235. Progression has been stable. Findings include normal foveal contour, no IRF, retinal drusen , subretinal hyper-reflective material, pigment epithelial detachment, subretinal fluid, outer retinal atrophy (Patchy central ORA with PEDs, central pocket of SRF -- slightly improved).   Left Eye Quality was borderline. Scan locations included subfoveal. Central Foveal Thickness: 202. Progression has been stable. Findings include no IRF, no SRF, abnormal foveal contour, retinal drusen , outer retinal tubulation, pigment epithelial detachment, outer retinal atrophy (Central ORA / GA with PEDs and ORT).   Notes *Images captured and stored on drive  Diagnosis / Impression:  OD: exudative ARMD - Patchy central ORA with PEDs, central pocket of SRF -- slightly improved OS: non exudative ARMD - Central ORA / GA with PEDs and ORT  Clinical management:  See below  Abbreviations: NFP - Normal foveal profile. CME - cystoid macular edema. PED - pigment epithelial detachment. IRF - intraretinal fluid. SRF - subretinal fluid. EZ - ellipsoid zone. ERM - epiretinal membrane. ORA - outer retinal atrophy. ORT - outer retinal tubulation. SRHM - subretinal hyper-reflective material. IRHM - intraretinal hyper-reflective material      Intravitreal Injection, Pharmacologic Agent - OD - Right Eye       Time Out 04/07/2023. 10:23 AM. Confirmed correct patient, procedure, site, and patient consented.   Anesthesia Topical anesthesia was used. Anesthetic medications included Lidocaine 2%, Proparacaine 0.5%.   Procedure Preparation included 5% betadine to ocular surface, eyelid speculum. A (32g) needle was used.   Injection: 2 mg aflibercept 2 MG/0.05ML   Route: Intravitreal, Site: Right  Eye   NDC: [id number], Lot: [id number], Expiration date: 04/08/2024, Waste: 0 mL   Post-op Post injection exam found visual acuity of at least  counting fingers. The patient tolerated the procedure well. There were no complications. The patient received written and verbal post procedure care education.            ASSESSMENT/PLAN:    ICD-10-CM   1. Exudative age-related macular degeneration of right eye with active choroidal neovascularization (HCC)  H35.3211 OCT, Retina - OU - Both Eyes    Intravitreal Injection, Pharmacologic Agent - OD - Right Eye    aflibercept (EYLEA) SOLN 2 mg    2. 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 Essential hypertension  I10     4. Hypertensive retinopathy of both eyes  H35.033     5. Pseudophakia, both eyes  Z96.1      Exudative age related macular degeneration, OD  - previous Dr. Luciana Axe pt here due to insurance  - pt reports history of injections OD w/ [name], but unable to see record of that  - here s/p IVA OD #1 (02.07.24), #2 (03.13.24), #3 (04.17.24), #4 (05.31.24)  - BCVA OD 20/70  - OCT OD: Patchy central ORA with PEDs and persistent central pocket of SRF at 6 wks **discussed decreased efficacy / resistance to Avastin and potential benefit of switching medication**  - Insurance approved Eylea  - recommend switching to IVE OD #1 today, 06.28.24 w/ f/u up back to 4 wks - pt wishes to proceed with injection - RBA of procedure discussed, questions answered - IVA informed consent obtained and signed, 02.07.24 (OD) - IVE informed consent obtained and signed, 06.28.24 (OD) - see procedure note   - f/u in 4 wks -- DFE/OCT, possible injection  2. Age related macular degeneration, non-exudative, left eye  - advanced stage w/ significant central GA  - BCVA CF 3' -- stable   - history of IVA OS w/ [name], last 05.24.21  - OCT OS shows Central ORA / GA with PEDs and ORT - The incidence, anatomy, and pathology of dry AMD, risk of progression, and the AREDS and AREDS 2 study including smoking risks discussed with  patient.  - Recommend amsler grid monitoring  - monitor  3,4. Hypertensive retinopathy OU - discussed importance of tight BP control - monitor  5. Pseudophakia OU  - s/p CE/IOL (Dr. Nile Riggs)  - IOL in good position, doing well  - monitor  Ophthalmic Meds Ordered this visit:  Meds ordered this encounter  Medications   aflibercept (EYLEA) SOLN 2 mg     Return in about 4 weeks (around 05/05/2023) for f/u Ex. AMD OD , DFE, OCT, Possible, IVE, OD.  There are no Patient Instructions on file for this visit.   This document serves as a record of services personally performed by Karie Chimera, MD, PhD. It was created on their behalf by Berlin Hun COT, an ophthalmic technician. The creation of this record is the provider's dictation and/or activities during the visit.    Electronically signed by: Berlin Hun COT 06.26.2024 12:17 PM  This document serves as a record of services personally performed by Karie Chimera, MD, PhD. It was created on their behalf by Gerilyn Nestle, COT an ophthalmic technician. The creation of this record is the provider's dictation and/or activities during the visit.    Electronically signed by:  Charlette Caffey, COT  04/07/23 12:17  PM  Karie Chimera, M.D., Ph.D. Diseases & Surgery of the Retina and Vitreous [facility] 04/07/2023   I have reviewed the above documentation for accuracy and completeness, and I agree with the above. Karie Chimera, M.D., Ph.D. 04/07/23 12:19 PM   Abbreviations: M myopia (nearsighted); A astigmatism; H hyperopia (farsighted); P presbyopia; Mrx spectacle prescription;  CTL contact lenses; OD right eye; OS left eye; OU both eyes  XT exotropia; ET esotropia; PEK punctate epithelial keratitis; PEE punctate epithelial erosions; DES dry eye syndrome; MGD meibomian gland dysfunction; ATs artificial tears; PFAT's preservative free artificial tears; NSC nuclear sclerotic cataract; PSC  posterior subcapsular cataract; ERM epi-retinal membrane; PVD posterior vitreous detachment; RD retinal detachment; DM diabetes mellitus; DR diabetic retinopathy; NPDR non-proliferative diabetic retinopathy; PDR proliferative diabetic retinopathy; CSME clinically significant macular edema; DME diabetic macular edema; dbh dot blot hemorrhages; CWS cotton wool spot; POAG primary open angle glaucoma; C/D cup-to-disc ratio; HVF humphrey visual field; GVF goldmann visual field; OCT optical coherence tomography; IOP intraocular pressure; BRVO Branch retinal vein occlusion; CRVO central retinal vein occlusion; CRAO central retinal artery occlusion; BRAO branch retinal artery occlusion; RT retinal tear; SB scleral buckle; PPV pars plana vitrectomy; VH Vitreous hemorrhage; PRP panretinal laser photocoagulation; IVK intravitreal kenalog; VMT vitreomacular traction; MH Macular hole;  NVD neovascularization of the disc; NVE neovascularization elsewhere; AREDS age related eye disease study; ARMD age related macular degeneration; POAG primary open angle glaucoma; EBMD epithelial/anterior basement membrane dystrophy; ACIOL anterior chamber intraocular lens; IOL intraocular lens; PCIOL posterior chamber intraocular lens; Phaco/IOL phacoemulsification with intraocular lens placement; PRK photorefractive keratectomy; LASIK laser assisted in situ keratomileusis; HTN hypertension; DM diabetes mellitus; COPD chronic obstructive pulmonary disease

## 2023-04-06 ENCOUNTER — Encounter: Payer: Self-pay | Admitting: Cardiology

## 2023-04-07 ENCOUNTER — Ambulatory Visit (INDEPENDENT_AMBULATORY_CARE_PROVIDER_SITE_OTHER): Payer: Medicare Other | Admitting: Ophthalmology

## 2023-04-07 ENCOUNTER — Encounter (INDEPENDENT_AMBULATORY_CARE_PROVIDER_SITE_OTHER): Payer: Self-pay | Admitting: Ophthalmology

## 2023-04-07 ENCOUNTER — Other Ambulatory Visit: Payer: Self-pay | Admitting: Neurology

## 2023-04-07 DIAGNOSIS — H353124 Nonexudative age-related macular degeneration, left eye, advanced atrophic with subfoveal involvement: Secondary | ICD-10-CM | POA: Diagnosis not present

## 2023-04-07 DIAGNOSIS — H35033 Hypertensive retinopathy, bilateral: Secondary | ICD-10-CM | POA: Diagnosis not present

## 2023-04-07 DIAGNOSIS — Z961 Presence of intraocular lens: Secondary | ICD-10-CM

## 2023-04-07 DIAGNOSIS — I10 Essential (primary) hypertension: Secondary | ICD-10-CM | POA: Diagnosis not present

## 2023-04-07 DIAGNOSIS — H353211 Exudative age-related macular degeneration, right eye, with active choroidal neovascularization: Secondary | ICD-10-CM

## 2023-04-07 MED ORDER — AFLIBERCEPT 2MG/0.05ML IZ SOLN FOR KALEIDOSCOPE
2.0000 mg | INTRAVITREAL | Status: AC | PRN
Start: 2023-04-07 — End: 2023-04-07
  Administered 2023-04-07: 2 mg via INTRAVITREAL

## 2023-04-18 ENCOUNTER — Ambulatory Visit: Payer: Medicare Other | Attending: Cardiology

## 2023-04-18 ENCOUNTER — Other Ambulatory Visit: Payer: Self-pay | Admitting: Family Medicine

## 2023-04-18 DIAGNOSIS — Z7901 Long term (current) use of anticoagulants: Secondary | ICD-10-CM

## 2023-04-18 DIAGNOSIS — J449 Chronic obstructive pulmonary disease, unspecified: Secondary | ICD-10-CM

## 2023-04-18 DIAGNOSIS — I4891 Unspecified atrial fibrillation: Secondary | ICD-10-CM | POA: Diagnosis not present

## 2023-04-18 DIAGNOSIS — Z5181 Encounter for therapeutic drug level monitoring: Secondary | ICD-10-CM

## 2023-04-18 DIAGNOSIS — I4821 Permanent atrial fibrillation: Secondary | ICD-10-CM

## 2023-04-18 LAB — POCT INR: INR: 3.4 — AB (ref 2.0–3.0)

## 2023-04-18 NOTE — Patient Instructions (Signed)
Description   HOLD today's dose and then continue taking 1 tablet daily except 0.5 tablet on Sunday, Monday, Wednesday and Friday.  Stay consistent with greens (2 times per week)  Recheck INR 3 weeks  Coumadin Clinic [phone]

## 2023-04-21 ENCOUNTER — Other Ambulatory Visit: Payer: Self-pay | Admitting: Nurse Practitioner

## 2023-04-21 DIAGNOSIS — J3089 Other allergic rhinitis: Secondary | ICD-10-CM

## 2023-05-03 DIAGNOSIS — L821 Other seborrheic keratosis: Secondary | ICD-10-CM | POA: Diagnosis not present

## 2023-05-03 DIAGNOSIS — L814 Other melanin hyperpigmentation: Secondary | ICD-10-CM | POA: Diagnosis not present

## 2023-05-03 DIAGNOSIS — D225 Melanocytic nevi of trunk: Secondary | ICD-10-CM | POA: Diagnosis not present

## 2023-05-03 DIAGNOSIS — L570 Actinic keratosis: Secondary | ICD-10-CM | POA: Diagnosis not present

## 2023-05-03 DIAGNOSIS — L89319 Pressure ulcer of right buttock, unspecified stage: Secondary | ICD-10-CM | POA: Diagnosis not present

## 2023-05-04 ENCOUNTER — Other Ambulatory Visit: Payer: Self-pay | Admitting: Neurology

## 2023-05-05 NOTE — Progress Notes (Signed)
[facility] - Clinic Note  05/08/2023     CHIEF COMPLAINT Patient presents for Retina Follow Up   HISTORY OF PRESENT ILLNESS: Warren Wagner. is a 88 y.o. male who presents to the clinic today for:   HPI     Retina Follow Up   Patient presents with  Wet AMD.  In right eye.  This started 4 weeks ago.  Duration of 4 weeks.  Since onset it is stable.  I, the attending physician,  performed the HPI with the patient and updated documentation appropriately.        Comments   4 week retina follow up AMD and I'VE OS pt is reporting no vision changes noticed he denies any flashes or floaters       Last edited by Warren Chris, MD on 05/08/2023 12:17 PM.     Patient feels the vision is the same. He had no issues after the first Eylea injection.    Referring physician: Melida Quitter, PA [address] [place],  [place] [address]  HISTORICAL INFORMATION:   Selected notes from the MEDICAL RECORD NUMBER [name] transferring care due to insurance LEE: 03.27.23 [BCVA 20/40 OD, CF OS] Ocular Hx- ex ARMD OU; last injection was IVA OS on 05.24.21  PMH-    CURRENT MEDICATIONS: No current outpatient medications on file. (Ophthalmic Drugs)   No current facility-administered medications for this visit. (Ophthalmic Drugs)   Current Outpatient Medications (Other)  Medication Sig   acetaminophen (TYLENOL) 500 MG tablet Take 1,000 mg by mouth every 6 (six) hours as needed for moderate pain or headache.   amoxicillin (AMOXIL) 500 MG tablet Take 4 tablets (2,000 mg total) by mouth as directed. 1 hour prior to dental work including cleanings   docusate sodium (COLACE) 100 MG capsule Take 100 mg by mouth daily.   EQ ALLERGY RELIEF, CETIRIZINE, 10 MG tablet Take 1 tablet by mouth once daily   finasteride (PROSCAR) 5 MG tablet Take 1 tablet by mouth once daily   furosemide (LASIX) 40 MG tablet Take 1 tablet (40 mg total) by mouth daily. (Patient taking  differently: Take 20-40 mg by mouth See admin instructions. Taking 20 mg daily except on a day when weight gain is noticed can take 40 mg daily)   memantine (NAMENDA) 10 MG tablet Take 1 tablet by mouth twice daily   metoprolol succinate (TOPROL-XL) 50 MG 24 hr tablet Take 1 tablet (50 mg total) by mouth daily.   Multiple Vitamins-Minerals (MULTIVITAMIN WITH MINERALS) tablet Take 1 tablet by mouth daily.   Multiple Vitamins-Minerals (OCUVITE PO) Take 1 tablet by mouth daily.   OMEGA-3 FATTY ACIDS PO Take 500 mg by mouth daily.   QUEtiapine (SEROQUEL) 25 MG tablet Take 0.5 tablets (12.5 mg total) by mouth 2 (two) times daily.   sodium chloride (OCEAN) 0.65 % SOLN nasal spray Place 1 spray into both nostrils daily as needed for congestion.   TRELEGY ELLIPTA 100-62.5-25 MCG/ACT AEPB Inhale 1 puff by mouth once daily   warfarin (COUMADIN) 5 MG tablet TAKE 1/2 TO 1 (ONE-HALF TO ONE) TABLET BY MOUTH ONCE DAILY AS DIRECTED BY  ANTICOAGULATION  CLINIC.   No current facility-administered medications for this visit. (Other)   REVIEW OF SYSTEMS: ROS   Positive for: Cardiovascular, Eyes Negative for: Constitutional, Gastrointestinal, Neurological, Skin, Genitourinary, Musculoskeletal, HENT, Endocrine, Respiratory, Psychiatric, Allergic/Imm, Heme/Lymph Last edited by Etheleen Mayhew, COT on 05/08/2023  9:05 AM.  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 Clifton James & Dr. Laneta Simmers   Severe aortic stenosis    Umbilical hernia 07/27/2012   Past Surgical History:  Procedure Laterality Date   CARDIOVERSION  10/10/2006   cataracts Bilateral    HERNIA  REPAIR  09/10/2012   large umbilical hernia   INSERTION OF MESH  09/10/2012   Procedure: INSERTION OF MESH;  Surgeon: [name], MD;  Location: [facility] OR;  Service: General;  Laterality: N/A;   INTRAOPERATIVE TRANSTHORACIC ECHOCARDIOGRAM N/A 10/18/2022   Procedure: INTRAOPERATIVE TRANSTHORACIC ECHOCARDIOGRAM;  Surgeon: Kathleene Hazel, MD;  Location: [facility] INVASIVE CV LAB;  Service: Open Heart Surgery;  Laterality: N/A;   macular degeneration Bilateral    RIGHT HEART CATH AND CORONARY ANGIOGRAPHY N/A 09/09/2022   Procedure: RIGHT HEART CATH AND CORONARY ANGIOGRAPHY;  Surgeon: Kathleene Hazel, MD;  Location: [facility] INVASIVE CV LAB;  Service: Cardiovascular;  Laterality: N/A;   TONSILLECTOMY     "maybe" (09/10/2012)   TRANSCATHETER AORTIC VALVE REPLACEMENT, TRANSFEMORAL N/A 10/18/2022   Procedure: Transcatheter Aortic Valve Replacement, Transfemoral;  Surgeon: Kathleene Hazel, MD;  Location: [facility] INVASIVE CV LAB;  Service: Open Heart Surgery;  Laterality: N/A;   UMBILICAL HERNIA REPAIR  09/10/2012   Procedure: HERNIA REPAIR UMBILICAL ADULT;  Surgeon: [name], MD;  Location: [facility] OR;  Service: General;  Laterality: N/A;   FAMILY HISTORY Family History  Problem Relation Age of Onset   Heart disease Mother    Stroke Father    SOCIAL HISTORY Social History   Tobacco Use   Smoking status: Former    Current packs/day: 0.00    Average packs/day: 1.5 packs/day for 10.0 years (15.0 ttl pk-yrs)    Types: Cigarettes    Start date: 07/27/1972    Quit date: 07/27/1982    Years since quitting: 40.8   Smokeless tobacco: Never  Vaping Use   Vaping status: Never Used  Substance Use Topics   Drug use: No       OPHTHALMIC EXAM:  Base Eye Exam     Visual Acuity (Snellen - Linear)       Right Left   Dist [place] 20/150 -3 CF at 3'   Dist ph [place] NI NI         Tonometry (Tonopen, 9:10 AM)       Right Left   Pressure 11 10         Pupils       Pupils Dark Light  Shape React APD   Right PERRL 2 1 Round Brisk None   Left PERRL 2 1 Round Brisk None         Visual Fields       Left Right    Full Full         Extraocular Movement       Right Left    Full, Ortho Full, Ortho         Neuro/Psych     Oriented x3: Yes   Mood/Affect: Normal         Dilation     Both eyes: 2.5% Phenylephrine @  9:10 AM           Slit Lamp and Fundus Exam     External Exam       Right Left   External Normal Normal         Slit Lamp Exam       Right Left   Lids/Lashes Dermatochalasis - upper lid, Ptosis Dermatochalasis - upper lid, mild MGD   Conjunctiva/Sclera White and quiet White and quiet   Cornea 1-2+ Punctate epithelial erosions, mild arcus, well healed cataract wound, tear film debris Trace tear film debris, well healed cataract wound, arcus   Anterior Chamber deep and clear deep and clear   Iris Round and dilated round and poorly dilated   Lens PC IOL in good position, 1-2+ Posterior capsular opacification PC IOL in good position, 2-3+ Posterior capsular opacification   Anterior Vitreous mild syneresis Vitreous syneresis         Fundus Exam       Right Left   Posterior Vitreous Posterior vitreous detachment Posterior vitreous detachment, vitreous condensations   Disc 2+Pallor, Sharp rim, Compact 2+Pallor, Sharp rim   C/D Ratio 0.2 0.3   Macula Blunted foveal reflex, refractile Drusen, RPE mottling, clumping and atrophy, focal central edema, no frank heme Flat, blunted foveal reflex, refractile Drusen, RPE mottling, clumping and atrophy, central GA, no frank heme   Vessels attenuated, Tortuous attenuated, mild tortuosity   Periphery Attached, reticular degeneration, mild heme Attached, reticular degeneration, mild heme           Refraction     Wearing Rx       Sphere Cylinder Axis Add   Right -0.75 +0.75 087 +2.75   Left -0.75 +0.75 083 +2.75           IMAGING AND PROCEDURES  Imaging and Procedures for  05/08/2023  OCT, Retina - OU - Both Eyes       Right Eye Quality was good. Scan locations included subfoveal. Central Foveal Thickness: 226. Progression has been stable. Findings include normal foveal contour, no IRF, retinal drusen , subretinal hyper-reflective material, pigment epithelial detachment, subretinal fluid, outer retinal atrophy (Patchy central ORA with PEDs, central pocket of SRF -- slightly improved).   Left Eye Quality was borderline. Scan locations included subfoveal. Central Foveal Thickness: 255. Progression has been stable. Findings include no IRF, no SRF, abnormal foveal contour, retinal drusen , outer retinal tubulation, pigment epithelial detachment, outer retinal atrophy (Central ORA / GA with PEDs and ORT).   Notes *Images captured and stored on drive  Diagnosis / Impression:  OD: exudative ARMD - Patchy central ORA with PEDs, central pocket of SRF -- slightly improved OS: non exudative ARMD - Central ORA / GA with PEDs and ORT  Clinical management:  See below  Abbreviations: NFP - Normal foveal profile. CME - cystoid macular edema. PED - pigment epithelial detachment. IRF - intraretinal fluid. SRF - subretinal fluid. EZ - ellipsoid zone. ERM - epiretinal membrane. ORA - outer retinal atrophy. ORT - outer retinal tubulation. SRHM - subretinal hyper-reflective material. IRHM - intraretinal hyper-reflective material      Intravitreal Injection, Pharmacologic Agent - OD - Right Eye       Time Out 05/08/2023. 9:33 AM. Confirmed correct patient, procedure, site, and patient consented.   Anesthesia Topical anesthesia was used. Anesthetic medications included Lidocaine 2%, Proparacaine 0.5%.   Procedure Preparation included 5% betadine to ocular surface, eyelid speculum. A (32g) needle was used.   Injection: 2 mg aflibercept 2  MG/0.05ML   Route: Intravitreal, Site: Right Eye   NDC: [id number], Lot: [id number], Expiration date: 06/09/2024, Waste: 0 mL    Post-op Post injection exam found visual acuity of at least counting fingers. The patient tolerated the procedure well. There were no complications. The patient received written and verbal post procedure care education.             ASSESSMENT/PLAN:    ICD-10-CM   1. Exudative age-related macular degeneration of right eye with active choroidal neovascularization (HCC)  H35.3211 OCT, Retina - OU - Both Eyes    Intravitreal Injection, Pharmacologic Agent - OD - Right Eye    aflibercept (EYLEA) SOLN 2 mg    2. 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 Essential hypertension  I10     4. Hypertensive retinopathy of both eyes  H35.033     5. Pseudophakia, both eyes  Z96.1       Exudative age related macular degeneration, OD  - previous Dr. Luciana Axe pt here due to insurance  - pt reports history of injections OD w/ [name], but unable to see record of that  - here s/p IVA OD #1 (02.07.24), #2 (03.13.24), #3 (04.17.24), #4 (05.31.24) -- IVA resistance  - s/p IVE OD #1 06.28.24  - BCVA OD 20/150 decreased from 20/70  - OCT OD: Patchy central ORA with PEDs, central pocket of SRF -- slightly improved at 4 wks - Insurance approved Eylea  - recommend IVE OD #2 today, 07.29.24 w/ f/u up in 4 wks - pt wishes to proceed with injection - RBA of procedure discussed, questions answered - IVA informed consent obtained and signed, 02.07.24 (OD) - IVE informed consent obtained and signed, 06.28.24 (OD) - see procedure note   - f/u in 4 wks -- DFE/OCT, possible injection  2. Age related macular degeneration, non-exudative, left eye  - advanced stage w/ significant central GA  - BCVA CF 3' -- stable   - history of IVA OS w/ [name], last 05.24.21  - OCT OS shows Central ORA / GA with PEDs and ORT - The incidence, anatomy, and pathology of dry AMD, risk of progression, and the AREDS and AREDS 2 study including smoking  risks discussed with patient.  - Recommend amsler grid monitoring  - monitor  3,4. Hypertensive retinopathy OU - discussed importance of tight BP control - monitor  5. Pseudophakia OU  - s/p CE/IOL (Dr. Nile Riggs)  - IOL in good position, doing well  - monitor  Ophthalmic Meds Ordered this visit:  Meds ordered this encounter  Medications   aflibercept (EYLEA) SOLN 2 mg     Return in about 4 weeks (around 06/05/2023) for f/u EX. AMD OD, DFE, OCT, Possible, IVE, OD.  There are no Patient Instructions on file for this visit.   This document serves as a record of services personally performed by Karie Chimera, MD, PhD. It was created on their behalf by Annalee Genta, COMT. The creation of this record is the provider's dictation and/or activities during the visit.  Electronically signed by: Annalee Genta, COMT 05/08/23 12:19 PM  This document serves as a record of services personally performed by Karie Chimera, MD, PhD. It was created on their behalf by Gerilyn Nestle, COT an ophthalmic technician. The creation of this record is the provider's dictation and/or activities during the visit.    Electronically signed by:  Charlette Caffey, COT  05/08/23 12:19  PM  Karie Chimera, M.D., Ph.D. Diseases & Surgery of the Retina and Vitreous [facility]  I have reviewed the above documentation for accuracy and completeness, and I agree with the above. Karie Chimera, M.D., Ph.D. 05/08/23 12:21 PM  Abbreviations: M myopia (nearsighted); A astigmatism; H hyperopia (farsighted); P presbyopia; Mrx spectacle prescription;  CTL contact lenses; OD right eye; OS left eye; OU both eyes  XT exotropia; ET esotropia; PEK punctate epithelial keratitis; PEE punctate epithelial erosions; DES dry eye syndrome; MGD meibomian gland dysfunction; ATs artificial tears; PFAT's preservative free artificial tears; NSC nuclear sclerotic cataract; PSC posterior subcapsular cataract; ERM  epi-retinal membrane; PVD posterior vitreous detachment; RD retinal detachment; DM diabetes mellitus; DR diabetic retinopathy; NPDR non-proliferative diabetic retinopathy; PDR proliferative diabetic retinopathy; CSME clinically significant macular edema; DME diabetic macular edema; dbh dot blot hemorrhages; CWS cotton wool spot; POAG primary open angle glaucoma; C/D cup-to-disc ratio; HVF humphrey visual field; GVF goldmann visual field; OCT optical coherence tomography; IOP intraocular pressure; BRVO Branch retinal vein occlusion; CRVO central retinal vein occlusion; CRAO central retinal artery occlusion; BRAO branch retinal artery occlusion; RT retinal tear; SB scleral buckle; PPV pars plana vitrectomy; VH Vitreous hemorrhage; PRP panretinal laser photocoagulation; IVK intravitreal kenalog; VMT vitreomacular traction; MH Macular hole;  NVD neovascularization of the disc; NVE neovascularization elsewhere; AREDS age related eye disease study; ARMD age related macular degeneration; POAG primary open angle glaucoma; EBMD epithelial/anterior basement membrane dystrophy; ACIOL anterior chamber intraocular lens; IOL intraocular lens; PCIOL posterior chamber intraocular lens; Phaco/IOL phacoemulsification with intraocular lens placement; PRK photorefractive keratectomy; LASIK laser assisted in situ keratomileusis; HTN hypertension; DM diabetes mellitus; COPD chronic obstructive pulmonary disease

## 2023-05-08 ENCOUNTER — Ambulatory Visit (INDEPENDENT_AMBULATORY_CARE_PROVIDER_SITE_OTHER): Payer: Medicare Other | Admitting: Ophthalmology

## 2023-05-08 ENCOUNTER — Encounter (INDEPENDENT_AMBULATORY_CARE_PROVIDER_SITE_OTHER): Payer: Self-pay | Admitting: Ophthalmology

## 2023-05-08 DIAGNOSIS — H35033 Hypertensive retinopathy, bilateral: Secondary | ICD-10-CM | POA: Diagnosis not present

## 2023-05-08 DIAGNOSIS — H353211 Exudative age-related macular degeneration, right eye, with active choroidal neovascularization: Secondary | ICD-10-CM | POA: Diagnosis not present

## 2023-05-08 DIAGNOSIS — I10 Essential (primary) hypertension: Secondary | ICD-10-CM | POA: Diagnosis not present

## 2023-05-08 DIAGNOSIS — H353124 Nonexudative age-related macular degeneration, left eye, advanced atrophic with subfoveal involvement: Secondary | ICD-10-CM | POA: Diagnosis not present

## 2023-05-08 DIAGNOSIS — Z961 Presence of intraocular lens: Secondary | ICD-10-CM

## 2023-05-08 MED ORDER — AFLIBERCEPT 2MG/0.05ML IZ SOLN FOR KALEIDOSCOPE
2.0000 mg | INTRAVITREAL | Status: AC | PRN
Start: 2023-05-08 — End: 2023-05-08
  Administered 2023-05-08: 2 mg via INTRAVITREAL

## 2023-05-09 ENCOUNTER — Ambulatory Visit: Payer: Medicare Other | Attending: Cardiology

## 2023-05-09 DIAGNOSIS — Z5181 Encounter for therapeutic drug level monitoring: Secondary | ICD-10-CM

## 2023-05-09 DIAGNOSIS — I4891 Unspecified atrial fibrillation: Secondary | ICD-10-CM | POA: Diagnosis not present

## 2023-05-09 LAB — POCT INR: INR: 2.9 (ref 2.0–3.0)

## 2023-05-09 NOTE — Patient Instructions (Signed)
Description   Continue taking 1 tablet daily except 0.5 tablet on Sunday, Monday, Wednesday and Friday.  Stay consistent with greens (2 times per week)  Recheck INR 4 weeks  Coumadin Clinic [phone]

## 2023-05-15 ENCOUNTER — Telehealth: Payer: Self-pay | Admitting: *Deleted

## 2023-05-15 ENCOUNTER — Encounter: Payer: Self-pay | Admitting: Cardiology

## 2023-05-15 ENCOUNTER — Encounter: Payer: Self-pay | Admitting: Family Medicine

## 2023-05-15 ENCOUNTER — Telehealth: Payer: Self-pay | Admitting: Cardiology

## 2023-05-15 NOTE — Telephone Encounter (Signed)
Morrell Riddle from [facility] calling to notify provider that they will be providing palliative care for this patient.  Originally she said she needed a verbal in the message which Saralyn Pilar PA gave, but when I returned call she said that it was just to notify the provider that these services would be started.

## 2023-05-15 NOTE — Telephone Encounter (Signed)
Kyra from [facility] is calling. She mentioned that the patient's family is requesting an order for palliative care from Dr. [name]. She stated that they can either receive a verbal order or have the order faxed to [phone].

## 2023-05-15 NOTE — Telephone Encounter (Signed)
Returned call to [facility] and spoke with Star. She is not sure if they needed an order. Did advise to reach out to PCP.

## 2023-05-17 ENCOUNTER — Other Ambulatory Visit: Payer: Self-pay | Admitting: Family Medicine

## 2023-05-17 DIAGNOSIS — J449 Chronic obstructive pulmonary disease, unspecified: Secondary | ICD-10-CM

## 2023-05-29 ENCOUNTER — Other Ambulatory Visit: Payer: Self-pay | Admitting: Family Medicine

## 2023-05-29 DIAGNOSIS — R531 Weakness: Secondary | ICD-10-CM

## 2023-05-30 ENCOUNTER — Ambulatory Visit: Payer: Medicare Other | Admitting: Family Medicine

## 2023-05-30 NOTE — Progress Notes (Signed)
[facility] - Clinic Note  06/05/2023     CHIEF COMPLAINT Patient presents for Retina Follow Up   HISTORY OF PRESENT ILLNESS: Warren Wagner. is a 88 y.o. male who presents to the clinic today for:   HPI     Retina Follow Up   Patient presents with  Wet AMD.  In both eyes.  This started 4 weeks ago.  Duration of 4 weeks.  Since onset it is stable.  I, the attending physician,  performed the HPI with the patient and updated documentation appropriately.        Comments   4 week retina follow up ARMD and I'VE OD pt is reporting no vision changes noticed he denies any floaters he has had some flashes       Last edited by Rennis Chris, MD on 06/05/2023  1:34 PM.      Patient feels like his vision is stable, no new health concerns   Referring physician: Melida Quitter, PA [address] [place],  [place] [address]  HISTORICAL INFORMATION:   Selected notes from the MEDICAL RECORD NUMBER [name] transferring care due to insurance LEE: 03.27.23 [BCVA 20/40 OD, CF OS] Ocular Hx- ex ARMD OU; last injection was IVA OS on 05.24.21  PMH-    CURRENT MEDICATIONS: No current outpatient medications on file. (Ophthalmic Drugs)   No current facility-administered medications for this visit. (Ophthalmic Drugs)   Current Outpatient Medications (Other)  Medication Sig   acetaminophen (TYLENOL) 500 MG tablet Take 1,000 mg by mouth every 6 (six) hours as needed for moderate pain or headache.   amoxicillin (AMOXIL) 500 MG tablet Take 4 tablets (2,000 mg total) by mouth as directed. 1 hour prior to dental work including cleanings   docusate sodium (COLACE) 100 MG capsule Take 100 mg by mouth daily.   EQ ALLERGY RELIEF, CETIRIZINE, 10 MG tablet Take 1 tablet by mouth once daily   finasteride (PROSCAR) 5 MG tablet Take 1 tablet by mouth once daily   furosemide (LASIX) 40 MG tablet Take 1 tablet (40 mg total) by mouth daily. (Patient taking differently:  Take 20-40 mg by mouth See admin instructions. Taking 20 mg daily except on a day when weight gain is noticed can take 40 mg daily)   memantine (NAMENDA) 10 MG tablet Take 1 tablet by mouth twice daily   metoprolol succinate (TOPROL-XL) 50 MG 24 hr tablet Take 1 tablet (50 mg total) by mouth daily.   Multiple Vitamins-Minerals (MULTIVITAMIN WITH MINERALS) tablet Take 1 tablet by mouth daily.   Multiple Vitamins-Minerals (OCUVITE PO) Take 1 tablet by mouth daily.   OMEGA-3 FATTY ACIDS PO Take 500 mg by mouth daily.   QUEtiapine (SEROQUEL) 25 MG tablet Take 0.5 tablets (12.5 mg total) by mouth 2 (two) times daily.   sodium chloride (OCEAN) 0.65 % SOLN nasal spray Place 1 spray into both nostrils daily as needed for congestion.   TRELEGY ELLIPTA 100-62.5-25 MCG/ACT AEPB Inhale 1 puff by mouth once daily   warfarin (COUMADIN) 5 MG tablet TAKE 1/2 TO 1 (ONE-HALF TO ONE) TABLET BY MOUTH ONCE DAILY AS DIRECTED BY  ANTICOAGULATION  CLINIC.   No current facility-administered medications for this visit. (Other)   REVIEW OF SYSTEMS: ROS   Positive for: Cardiovascular, Eyes Negative for: Constitutional, Gastrointestinal, Neurological, Skin, Genitourinary, Musculoskeletal, HENT, Endocrine, Respiratory, Psychiatric, Allergic/Imm, Heme/Lymph Last edited by Etheleen Mayhew, COT on 06/05/2023  9:09 AM.  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 Clifton James & Dr. Laneta Simmers   Severe aortic stenosis    Umbilical hernia 07/27/2012   Past Surgical History:  Procedure Laterality Date   CARDIOVERSION  10/10/2006   cataracts Bilateral    HERNIA REPAIR   09/10/2012   large umbilical hernia   INSERTION OF MESH  09/10/2012   Procedure: INSERTION OF MESH;  Surgeon: [name], MD;  Location: [facility] OR;  Service: General;  Laterality: N/A;   INTRAOPERATIVE TRANSTHORACIC ECHOCARDIOGRAM N/A 10/18/2022   Procedure: INTRAOPERATIVE TRANSTHORACIC ECHOCARDIOGRAM;  Surgeon: Kathleene Hazel, MD;  Location: [facility] INVASIVE CV LAB;  Service: Open Heart Surgery;  Laterality: N/A;   macular degeneration Bilateral    RIGHT HEART CATH AND CORONARY ANGIOGRAPHY N/A 09/09/2022   Procedure: RIGHT HEART CATH AND CORONARY ANGIOGRAPHY;  Surgeon: Kathleene Hazel, MD;  Location: [facility] INVASIVE CV LAB;  Service: Cardiovascular;  Laterality: N/A;   TONSILLECTOMY     "maybe" (09/10/2012)   TRANSCATHETER AORTIC VALVE REPLACEMENT, TRANSFEMORAL N/A 10/18/2022   Procedure: Transcatheter Aortic Valve Replacement, Transfemoral;  Surgeon: Kathleene Hazel, MD;  Location: [facility] INVASIVE CV LAB;  Service: Open Heart Surgery;  Laterality: N/A;   UMBILICAL HERNIA REPAIR  09/10/2012   Procedure: HERNIA REPAIR UMBILICAL ADULT;  Surgeon: [name], MD;  Location: [facility] OR;  Service: General;  Laterality: N/A;   FAMILY HISTORY Family History  Problem Relation Age of Onset   Heart disease Mother    Stroke Father    SOCIAL HISTORY Social History   Tobacco Use   Smoking status: Former    Current packs/day: 0.00    Average packs/day: 1.5 packs/day for 10.0 years (15.0 ttl pk-yrs)    Types: Cigarettes    Start date: 07/27/1972    Quit date: 07/27/1982    Years since quitting: 40.8   Smokeless tobacco: Never  Vaping Use   Vaping status: Never Used  Substance Use Topics   Drug use: No       OPHTHALMIC EXAM:  Base Eye Exam     Visual Acuity (Snellen - Linear)       Right Left   Dist [place] 20/150 -1 CF at 3'   Dist ph [place] NI NI         Tonometry (Tonopen, 9:13 AM)       Right Left   Pressure 12 14         Pupils       Pupils Dark Light Shape React  APD   Right PERRL 2 1 Round Brisk None   Left PERRL 2 1 Round Brisk None         Visual Fields       Left Right    Full Full         Extraocular Movement       Right Left    Full, Ortho Full, Ortho         Neuro/Psych     Oriented x3: Yes   Mood/Affect: Normal         Dilation     Both eyes: 2.5% Phenylephrine @  9:11 AM           Slit Lamp and Fundus Exam     External Exam       Right Left   External Normal Normal         Slit Lamp Exam       Right Left   Lids/Lashes Dermatochalasis - upper lid, Ptosis Dermatochalasis - upper lid, mild MGD   Conjunctiva/Sclera White and quiet White and quiet   Cornea 1-2+ Punctate epithelial erosions, mild arcus, well healed cataract wound, tear film debris Trace tear film debris, well healed cataract wound, arcus   Anterior Chamber deep and clear deep and clear   Iris Round and dilated round and poorly dilated   Lens PC IOL in good position, 1-2+ Posterior capsular opacification PC IOL in good position, 2-3+ Posterior capsular opacification   Anterior Vitreous mild syneresis Vitreous syneresis         Fundus Exam       Right Left   Posterior Vitreous Posterior vitreous detachment Posterior vitreous detachment, vitreous condensations   Disc 2+Pallor, Sharp rim, Compact 2+Pallor, Sharp rim   C/D Ratio 0.2 0.3   Macula Blunted foveal reflex, refractile Drusen, RPE mottling, clumping and atrophy, focal central edema, no frank heme Flat, blunted foveal reflex, refractile Drusen, RPE mottling, clumping and atrophy, central GA, no frank heme   Vessels attenuated, Tortuous attenuated, mild tortuosity   Periphery Attached, reticular degeneration, mild heme Attached, reticular degeneration, mild heme           Refraction     Wearing Rx       Sphere Cylinder Axis Add   Right -0.75 +0.75 087 +2.75   Left -0.75 +0.75 083 +2.75           IMAGING AND PROCEDURES  Imaging and Procedures for  06/05/2023  OCT, Retina - OU - Both Eyes       Right Eye Quality was good. Scan locations included subfoveal. Central Foveal Thickness: 226. Progression has been stable. Findings include normal foveal contour, no IRF, retinal drusen , subretinal hyper-reflective material, pigment epithelial detachment, subretinal fluid, outer retinal atrophy (Patchy central ORA with PEDs, central pocket of SRF -- stably improved).   Left Eye Quality was borderline. Scan locations included subfoveal. Central Foveal Thickness: 264. Progression has been stable. Findings include no IRF, no SRF, abnormal foveal contour, retinal drusen , outer retinal tubulation, pigment epithelial detachment, outer retinal atrophy (Central ORA / GA with PEDs and ORT).   Notes *Images captured and stored on drive  Diagnosis / Impression:  OD: exudative ARMD - Patchy central ORA with PEDs, central pocket of SRF -- stably improved OS: non exudative ARMD - Central ORA / GA with PEDs and ORT  Clinical management:  See below  Abbreviations: NFP - Normal foveal profile. CME - cystoid macular edema. PED - pigment epithelial detachment. IRF - intraretinal fluid. SRF - subretinal fluid. EZ - ellipsoid zone. ERM - epiretinal membrane. ORA - outer retinal atrophy. ORT - outer retinal tubulation. SRHM - subretinal hyper-reflective material. IRHM - intraretinal hyper-reflective material      Intravitreal Injection, Pharmacologic Agent - OD - Right Eye       Time Out 06/05/2023. 9:55 AM. Confirmed correct patient, procedure, site, and patient consented.   Anesthesia Topical anesthesia was used. Anesthetic medications included Lidocaine 2%, Proparacaine 0.5%.   Procedure Preparation included 5% betadine to ocular surface, eyelid speculum. A (32g) needle was used.   Injection: 2 mg aflibercept 2  MG/0.05ML   Route: Intravitreal, Site: Right Eye   NDC: [id number], Lot: [id number], Expiration date: 07/09/2024, Waste: 0 mL    Post-op Post injection exam found visual acuity of at least counting fingers. The patient tolerated the procedure well. There were no complications. The patient received written and verbal post procedure care education.            ASSESSMENT/PLAN:    ICD-10-CM   1. Exudative age-related macular degeneration of right eye with active choroidal neovascularization (HCC)  H35.3211 OCT, Retina - OU - Both Eyes    Intravitreal Injection, Pharmacologic Agent - OD - Right Eye    aflibercept (EYLEA) SOLN 2 mg    2. 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 Essential hypertension  I10     4. Hypertensive retinopathy of both eyes  H35.033     5. Pseudophakia, both eyes  Z96.1      Exudative age related macular degeneration, OD  - previous Dr. Luciana Axe pt here due to insurance  - pt reports history of injections OD w/ [name], but unable to see record of that  - here s/p IVA OD #1 (02.07.24), #2 (03.13.24), #3 (04.17.24), #4 (05.31.24) -- IVA resistance  - s/p IVE OD #1 (06.28.24), #2 (07.29.24)  - BCVA OD stable at 20/150  - OCT OD: Patchy central ORA with PEDs, central pocket of SRF -- stably improved at 4 wks  - recommend IVE OD #3 today, 08.26.24 w/ f/u up extended to 5 wks - pt wishes to proceed with injection - RBA of procedure discussed, questions answered - IVE informed consent obtained and signed, 06.28.24 (OD) - see procedure note   - f/u in 5 wks -- DFE/OCT, possible injection  2. Age related macular degeneration, non-exudative, left eye  - advanced stage w/ significant central GA  - BCVA CF 3' -- stable   - history of IVA OS w/ [name], last 05.24.21  - OCT OS shows Central ORA / GA with PEDs and ORT  - Recommend amsler grid monitoring  - monitor  3,4. Hypertensive retinopathy OU - discussed importance of tight BP control - monitor  5. Pseudophakia OU  - s/p CE/IOL (Dr. Nile Riggs)  - IOL in  good position, doing well  - monitor  Ophthalmic Meds Ordered this visit:  Meds ordered this encounter  Medications   aflibercept (EYLEA) SOLN 2 mg     Return in about 5 weeks (around 07/10/2023) for f/u exu ARMD OD, DFE, OCT.  There are no Patient Instructions on file for this visit.   This document serves as a record of services personally performed by Karie Chimera, MD, PhD. It was created on their behalf by Annalee Genta, COMT. The creation of this record is the provider's dictation and/or activities during the visit.  Electronically signed by: Annalee Genta, COMT 06/06/23 11:38 PM  This document serves as a record of services personally performed by Karie Chimera, MD, PhD. It was created on their behalf by Glee Arvin. Manson Passey, OA an ophthalmic technician. The creation of this record is the provider's dictation and/or activities during the visit.    Electronically signed by: Glee Arvin. Manson Passey, OA 06/06/23 11:38 PM  Karie Chimera, M.D., Ph.D. Diseases & Surgery of the Retina and Vitreous [facility]  I have reviewed the above documentation for accuracy and completeness, and I agree with the above. Karie Chimera, M.D.,  Ph.D. 06/06/23 11:40 PM   Abbreviations: M myopia (nearsighted); A astigmatism; H hyperopia (farsighted); P presbyopia; Mrx spectacle prescription;  CTL contact lenses; OD right eye; OS left eye; OU both eyes  XT exotropia; ET esotropia; PEK punctate epithelial keratitis; PEE punctate epithelial erosions; DES dry eye syndrome; MGD meibomian gland dysfunction; ATs artificial tears; PFAT's preservative free artificial tears; NSC nuclear sclerotic cataract; PSC posterior subcapsular cataract; ERM epi-retinal membrane; PVD posterior vitreous detachment; RD retinal detachment; DM diabetes mellitus; DR diabetic retinopathy; NPDR non-proliferative diabetic retinopathy; PDR proliferative diabetic retinopathy; CSME clinically significant macular edema; DME  diabetic macular edema; dbh dot blot hemorrhages; CWS cotton wool spot; POAG primary open angle glaucoma; C/D cup-to-disc ratio; HVF humphrey visual field; GVF goldmann visual field; OCT optical coherence tomography; IOP intraocular pressure; BRVO Branch retinal vein occlusion; CRVO central retinal vein occlusion; CRAO central retinal artery occlusion; BRAO branch retinal artery occlusion; RT retinal tear; SB scleral buckle; PPV pars plana vitrectomy; VH Vitreous hemorrhage; PRP panretinal laser photocoagulation; IVK intravitreal kenalog; VMT vitreomacular traction; MH Macular hole;  NVD neovascularization of the disc; NVE neovascularization elsewhere; AREDS age related eye disease study; ARMD age related macular degeneration; POAG primary open angle glaucoma; EBMD epithelial/anterior basement membrane dystrophy; ACIOL anterior chamber intraocular lens; IOL intraocular lens; PCIOL posterior chamber intraocular lens; Phaco/IOL phacoemulsification with intraocular lens placement; PRK photorefractive keratectomy; LASIK laser assisted in situ keratomileusis; HTN hypertension; DM diabetes mellitus; COPD chronic obstructive pulmonary disease

## 2023-06-02 ENCOUNTER — Other Ambulatory Visit: Payer: Self-pay | Admitting: Neurology

## 2023-06-05 ENCOUNTER — Ambulatory Visit (INDEPENDENT_AMBULATORY_CARE_PROVIDER_SITE_OTHER): Payer: Medicare Other | Admitting: Ophthalmology

## 2023-06-05 ENCOUNTER — Encounter (INDEPENDENT_AMBULATORY_CARE_PROVIDER_SITE_OTHER): Payer: Self-pay | Admitting: Ophthalmology

## 2023-06-05 DIAGNOSIS — I10 Essential (primary) hypertension: Secondary | ICD-10-CM

## 2023-06-05 DIAGNOSIS — H353211 Exudative age-related macular degeneration, right eye, with active choroidal neovascularization: Secondary | ICD-10-CM | POA: Diagnosis not present

## 2023-06-05 DIAGNOSIS — H353124 Nonexudative age-related macular degeneration, left eye, advanced atrophic with subfoveal involvement: Secondary | ICD-10-CM

## 2023-06-05 DIAGNOSIS — H35033 Hypertensive retinopathy, bilateral: Secondary | ICD-10-CM

## 2023-06-05 DIAGNOSIS — Z961 Presence of intraocular lens: Secondary | ICD-10-CM

## 2023-06-05 MED ORDER — AFLIBERCEPT 2MG/0.05ML IZ SOLN FOR KALEIDOSCOPE
2.0000 mg | INTRAVITREAL | Status: AC | PRN
Start: 2023-06-05 — End: 2023-06-05
  Administered 2023-06-05: 2 mg via INTRAVITREAL

## 2023-06-06 ENCOUNTER — Ambulatory Visit: Payer: Medicare Other | Attending: Cardiology

## 2023-06-06 DIAGNOSIS — Z7901 Long term (current) use of anticoagulants: Secondary | ICD-10-CM

## 2023-06-06 DIAGNOSIS — I4891 Unspecified atrial fibrillation: Secondary | ICD-10-CM | POA: Diagnosis not present

## 2023-06-06 DIAGNOSIS — Z5181 Encounter for therapeutic drug level monitoring: Secondary | ICD-10-CM

## 2023-06-06 DIAGNOSIS — I4821 Permanent atrial fibrillation: Secondary | ICD-10-CM

## 2023-06-06 LAB — POCT INR: INR: 2.7 (ref 2.0–3.0)

## 2023-06-06 NOTE — Patient Instructions (Signed)
Description   Continue taking 1 tablet daily except 0.5 tablet on Sunday, Monday, Wednesday and Friday.  Stay consistent with greens (2 times per week)  Recheck INR 5 weeks  [facility] [phone]

## 2023-06-13 ENCOUNTER — Encounter: Payer: Self-pay | Admitting: Family Medicine

## 2023-06-13 ENCOUNTER — Ambulatory Visit (INDEPENDENT_AMBULATORY_CARE_PROVIDER_SITE_OTHER): Payer: Medicare Other | Admitting: Family Medicine

## 2023-06-13 VITALS — BP 144/84 | HR 69 | Resp 18 | Ht 72.0 in | Wt 222.0 lb

## 2023-06-13 DIAGNOSIS — I10 Essential (primary) hypertension: Secondary | ICD-10-CM | POA: Diagnosis not present

## 2023-06-13 DIAGNOSIS — J449 Chronic obstructive pulmonary disease, unspecified: Secondary | ICD-10-CM

## 2023-06-13 DIAGNOSIS — I4821 Permanent atrial fibrillation: Secondary | ICD-10-CM

## 2023-06-13 DIAGNOSIS — R5383 Other fatigue: Secondary | ICD-10-CM | POA: Diagnosis not present

## 2023-06-13 DIAGNOSIS — Z136 Encounter for screening for cardiovascular disorders: Secondary | ICD-10-CM

## 2023-06-13 MED ORDER — METOPROLOL SUCCINATE ER 50 MG PO TB24
50.0000 mg | ORAL_TABLET | Freq: Every day | ORAL | 1 refills | Status: DC
Start: 2023-06-13 — End: 2023-06-16

## 2023-06-13 NOTE — Assessment & Plan Note (Signed)
Stable.  Continue Trelegy Ellipta 100-60 2.5-25 mcg/act 1 puff daily.  Will continue to monitor.

## 2023-06-13 NOTE — Assessment & Plan Note (Addendum)
Stable.  Continue metoprolol 50 mg daily and ambulatory blood pressure monitoring.  Repeating CMP for medication monitoring.  Will continue to monitor.  Starting with CBC, CMP, TSH to evaluate for other potential causes of fatigue.  Otherwise, encouraged to follow-up with cardiology.

## 2023-06-13 NOTE — Assessment & Plan Note (Addendum)
Followed by cardiology.  Continue warfarin 5 mg daily and routine appointments with cardiology for management.

## 2023-06-13 NOTE — Progress Notes (Signed)
Established Patient Office Visit  Subjective   Patient ID: Warren Wagner., male    DOB: 1935-08-13  Age: 88 y.o. MRN: [id number]  Chief Complaint  Patient presents with   Hypertension    HPI Warren Wagner. is a 88 y.o. male presenting today for follow up of hypertension, COPD. Hypertension: Patient here for follow-up of elevated blood pressure. He is not exercising as he is limited in movement by his deteriorating health.   Pt denies chest pain, SOB, dizziness, edema, syncope, or heart palpitations.  He does endorse recent fatigue.  Taking metoprolol, reports excellent compliance with treatment. Denies side effects. COPD: He is currently using Trelegy 1 puff daily.  He reports excellent compliance with treatment.  He is not having side effects.  He has not used his rescue inhaler.  He reports breathing is unchanged  ROS Negative unless otherwise noted in HPI   Objective:     BP (!) 144/84 (BP Location: Left Arm, Patient Position: Sitting, Cuff Size: Normal)   Pulse 69   Resp 18   Ht 6' (1.829 m)   Wt 222 lb (100.7 kg)   SpO2 95%   BMI 30.11 kg/m   Physical Exam Constitutional:      General: He is not in acute distress.    Appearance: Normal appearance.  HENT:     Head: Normocephalic and atraumatic.  Cardiovascular:     Rate and Rhythm: Normal rate and regular rhythm.     Heart sounds: Normal heart sounds. No murmur heard.    No friction rub. No gallop.  Pulmonary:     Effort: Pulmonary effort is normal. No respiratory distress.     Breath sounds: Normal breath sounds. No wheezing, rhonchi or rales.  Skin:    General: Skin is warm and dry.  Neurological:     Mental Status: He is alert and oriented to person, place, and time.  Psychiatric:        Mood and Affect: Mood normal.     Assessment & Plan:  Essential hypertension Assessment & Plan: Stable.  Continue metoprolol 50 mg daily and ambulatory blood pressure monitoring.  Repeating CMP for  medication monitoring.  Will continue to monitor.  Starting with CBC, CMP, TSH to evaluate for other potential causes of fatigue.  Otherwise, encouraged to follow-up with cardiology.  Orders: -     Metoprolol Succinate ER; Take 1 tablet (50 mg total) by mouth daily.  Dispense: 90 tablet; Refill: 1 -     CBC with Differential/Platelet; Future -     Comprehensive metabolic panel; Future  Chronic obstructive pulmonary disease, unspecified COPD type (HCC) Assessment & Plan: Stable.  Continue Trelegy Ellipta 100-60 2.5-25 mcg/act 1 puff daily.  Will continue to monitor.   Atrial fibrillation, permanent [facility]) Assessment & Plan: Followed by cardiology.  Continue warfarin 5 mg daily and routine appointments with cardiology for management.  Orders: -     CBC with Differential/Platelet; Future -     Comprehensive metabolic panel; Future  Encounter for screening for cardiovascular disorders -     Lipid panel; Future  Fatigue, unspecified type -     TSH Rfx on Abnormal to Free T4; Future -     VITAMIN D 25 Hydroxy (Vit-D Deficiency, Fractures); Future  MOST Form Completed In Person MOST form completed with patient and daughter (healthcare POA) today, 06/13/2023 during in-person office visit. He is currently working with palliative care who recommended completing the MOST form. Section A: Attempt  resuscitation (CPR) Section B: Comfort measures Section C: Antibiotics if indicated Section D: IV fluids if indicated, feeding tube for a defined trial period Section E: Discussed with and agreed to by: patient; an individual with an established relationship with the patient who is acting in good faith and can reliably convey the wishes of the patient. The entirety of the form was reviewed and prepared in consultation with the patient and patient representative, Warren Wagner (daughter).  Return in about 6 months (around 12/11/2023) for follow-up for HTN, COPD, fasting blood work 1 week before.    I spent 55 minutes on the day of the encounter to include pre-visit record review, face-to-face time with the patient addressing chronic conditions and completing MOST form, and post visit ordering of tests.  Melida Quitter, PA

## 2023-06-14 ENCOUNTER — Telehealth: Payer: Self-pay | Admitting: Cardiology

## 2023-06-14 ENCOUNTER — Encounter: Payer: Self-pay | Admitting: Family Medicine

## 2023-06-14 DIAGNOSIS — L89319 Pressure ulcer of right buttock, unspecified stage: Secondary | ICD-10-CM | POA: Diagnosis not present

## 2023-06-14 DIAGNOSIS — R5383 Other fatigue: Secondary | ICD-10-CM

## 2023-06-14 DIAGNOSIS — I10 Essential (primary) hypertension: Secondary | ICD-10-CM

## 2023-06-14 NOTE — Telephone Encounter (Signed)
Returned call to daughter [name]. Pt states her father says he just feels bad. No chest pain or pressure. He is having dizziness and lightheadedness, no HA, no slurred speech or blurred vision. Swelling in his feet. Pt feels very unsteady and is falling into the walls. Does not have diabetes. Symptoms have been going on since Monday morning. He is very fatigue and tired. He states when he sits up he feels like something is pulling him down. HR 63 O2 94 BP 110/68 are his current vitals. Pt refuses to go to the ER as the EMS was there on Monday and they asked and pt said no.

## 2023-06-14 NOTE — Telephone Encounter (Signed)
Pt's daughter [name] is requesting a callback due to pt not feeling well. She stated on Monday is was pretty bad he fell into the wall and broke skin. He states he's just not feeling well and having spells as if he's going to collapse. [name] was going to take him to ED on Monday but he refused to go so she'd like to be advised on what she should do. Please advise.

## 2023-06-14 NOTE — Telephone Encounter (Signed)
Spoke to patient's daughter she stated father has been feeling dizzy,light headed,unsteady all week.He has been in bed most of day.She requested appointment.Appointment scheduled with Dr.Hochrein 9/6 at 2:30 pm.

## 2023-06-15 ENCOUNTER — Other Ambulatory Visit: Payer: Medicare Other

## 2023-06-15 DIAGNOSIS — Z136 Encounter for screening for cardiovascular disorders: Secondary | ICD-10-CM | POA: Diagnosis not present

## 2023-06-15 DIAGNOSIS — I10 Essential (primary) hypertension: Secondary | ICD-10-CM

## 2023-06-15 DIAGNOSIS — R5383 Other fatigue: Secondary | ICD-10-CM | POA: Diagnosis not present

## 2023-06-15 DIAGNOSIS — I4821 Permanent atrial fibrillation: Secondary | ICD-10-CM

## 2023-06-15 NOTE — Progress Notes (Signed)
Cardiology Office Note:   Date:  06/16/2023  ID:  Warren Wagner., DOB 06-24-1935, MRN [id number] PCP: Melida Quitter, PA  [facility] Providers Cardiologist:  [name], MD {  History of Present Illness:   Warren Wagner. is a 88 y.o. male who presents for  88 y.o. male who presents for evaluation of lower extremity swelling.  He has atrial fib and has been on warfarin.  LV function is OK.   There were no significant valvular abnormalities.  He has aortic root enlargement.  He was in the hospital in April 2023 with dizziness and orthostatic hypotension.    He had rhabdo and dehydration.  He had a UTI.  He was found to have severe AS and TAVR.   He has severe MR.   It was decided to manage him medically because of his multiple comorbid issues.    They have their first visit at home with palliative care.  Actually had to call the ambulance on Monday because he was so weak .  He did not want to get out of bed.  He did not want to be transported.  His heart rate runs low but his blood pressure was apparently okay.  He has been weak this week.   The patient denies any new symptoms such as chest discomfort, neck or arm discomfort. There has been no new shortness of breath, PND or orthopnea. There have been no reported palpitations, presyncope or syncope.   ROS: As stated in the HPI and negative for all other systems.  Studies Reviewed:    EKG:   EKG Interpretation Date/Time:  Friday June 16 2023 14:36:24 EDT Ventricular Rate:  49 PR Interval:    QRS Duration:  108 QT Interval:  460 QTC Calculation: 415 R Axis:   104  Text Interpretation: Atrial fibrillation with slow ventricular response Rightward axis Septal infarct (cited on or before 03-Feb-2022) When compared with ECG of 16-Jun-2023 14:35, no change Confirmed by [name] ([id number]) on 06/16/2023 2:49:37 PM    Risk Assessment/Calculations:    CHA2DS2-VASc Score = 3    This indicates a 3.2% annual risk  of stroke. The patient's score is based upon: CHF History: 0 HTN History: 1 Diabetes History: 0 Stroke History: 0 Vascular Disease History: 0 Age Score: 2 Gender Score: 0    Physical Exam:   VS:  BP 136/85 (BP Location: Right Arm, Patient Position: Sitting, Cuff Size: Large)   Pulse (!) 48   Ht 6' (1.829 m)   Wt 205 lb 9.6 oz (93.3 kg)   SpO2 95%   BMI 27.88 kg/m    Wt Readings from Last 3 Encounters:  06/16/23 205 lb 9.6 oz (93.3 kg)  06/13/23 222 lb (100.7 kg)  03/27/23 227 lb 3.2 oz (103.1 kg)     GEN: Well nourished, well developed in no acute distress NECK: No JVD; No carotid bruits CARDIAC: Irregular RR, no murmurs, rubs, gallops RESPIRATORY:  Clear to auscultation without rales, wheezing or rhonchi.   ABDOMEN: Soft, non-tender, non-distended EXTREMITIES:  No edema; No deformity   ASSESSMENT AND PLAN:   Severe AS s/p TAVR:   He is up-to-date with follow-up and we are managing this conservatively going forward.  No further imaging.   Severe mitral regurgitation:   He previously had been seen by Dr. Clifton Chastin Garlitz and the plan is to continue conservative management.   Permanent afib:   His heart rates going slow; reduce his metoprolol to 25 mg  daily.  He tolerates anticoagulation and he is up-to-date with blood work.  I reviewed blood work from yesterday.  Dyslipidemia: His LDL is slightly elevated at 148.  However, to avoid any polypharmacy I would not suggest starting statin.  I think the benefit would be marginal.  HTN: BP is at target.  No change in therapy.   TAA:   He has had some dilatation of his aortic 43 mm.  He would not be a candidate for intervention so no further imaging is suggested.     Follow up with me in 3 months.   Signed, [name], MD

## 2023-06-15 NOTE — Addendum Note (Signed)
Addended by: Tonny Bollman on: 06/15/2023 01:01 PM   Modules accepted: Orders

## 2023-06-16 ENCOUNTER — Ambulatory Visit: Payer: Medicare Other | Attending: Cardiology | Admitting: Cardiology

## 2023-06-16 ENCOUNTER — Encounter: Payer: Self-pay | Admitting: Cardiology

## 2023-06-16 VITALS — BP 136/85 | HR 48 | Ht 72.0 in | Wt 205.6 lb

## 2023-06-16 DIAGNOSIS — I10 Essential (primary) hypertension: Secondary | ICD-10-CM | POA: Diagnosis not present

## 2023-06-16 DIAGNOSIS — I340 Nonrheumatic mitral (valve) insufficiency: Secondary | ICD-10-CM

## 2023-06-16 DIAGNOSIS — I482 Chronic atrial fibrillation, unspecified: Secondary | ICD-10-CM | POA: Diagnosis not present

## 2023-06-16 DIAGNOSIS — Z952 Presence of prosthetic heart valve: Secondary | ICD-10-CM | POA: Diagnosis not present

## 2023-06-16 LAB — LIPID PANEL
Chol/HDL Ratio: 3.9 ratio (ref 0.0–5.0)
Cholesterol, Total: 221 mg/dL — ABNORMAL HIGH (ref 100–199)
HDL: 56 mg/dL (ref >39)
LDL Chol Calc (NIH): 148 mg/dL — ABNORMAL HIGH (ref 0–99)
Triglycerides: 94 mg/dL (ref 0–149)
VLDL Cholesterol Cal: 17 mg/dL (ref 5–40)

## 2023-06-16 LAB — CBC WITH DIFFERENTIAL/PLATELET
Basophils Absolute: 0.1 10*3/uL (ref 0.0–0.2)
Basos: 1 %
EOS (ABSOLUTE): 0.4 10*3/uL (ref 0.0–0.4)
Eos: 5 %
Hematocrit: 50.5 % (ref 37.5–51.0)
Hemoglobin: 16.7 g/dL (ref 13.0–17.7)
Immature Grans (Abs): 0 10*3/uL (ref 0.0–0.1)
Immature Granulocytes: 1 %
Lymphocytes Absolute: 1.7 10*3/uL (ref 0.7–3.1)
Lymphs: 21 %
MCH: 29.9 pg (ref 26.6–33.0)
MCHC: 33.1 g/dL (ref 31.5–35.7)
MCV: 91 fL (ref 79–97)
Monocytes Absolute: 0.8 10*3/uL (ref 0.1–0.9)
Monocytes: 10 %
Neutrophils Absolute: 5 10*3/uL (ref 1.4–7.0)
Neutrophils: 62 %
Platelets: 157 10*3/uL (ref 150–450)
RBC: 5.58 x10E6/uL (ref 4.14–5.80)
RDW: 12.4 % (ref 11.6–15.4)
WBC: 7.8 10*3/uL (ref 3.4–10.8)

## 2023-06-16 LAB — COMPREHENSIVE METABOLIC PANEL
ALT: 19 IU/L (ref 0–44)
AST: 35 IU/L (ref 0–40)
Albumin: 4 g/dL (ref 3.7–4.7)
Alkaline Phosphatase: 112 IU/L (ref 44–121)
BUN/Creatinine Ratio: 16 (ref 10–24)
BUN: 19 mg/dL (ref 8–27)
Bilirubin Total: 1.1 mg/dL (ref 0.0–1.2)
CO2: 25 mmol/L (ref 20–29)
Calcium: 9.6 mg/dL (ref 8.6–10.2)
Chloride: 101 mmol/L (ref 96–106)
Creatinine, Ser: 1.19 mg/dL (ref 0.76–1.27)
Globulin, Total: 2.7 g/dL (ref 1.5–4.5)
Glucose: 82 mg/dL (ref 70–99)
Potassium: 4.2 mmol/L (ref 3.5–5.2)
Sodium: 142 mmol/L (ref 134–144)
Total Protein: 6.7 g/dL (ref 6.0–8.5)
eGFR: 59 mL/min/{1.73_m2} — ABNORMAL LOW (ref >59)

## 2023-06-16 LAB — VITAMIN D 25 HYDROXY (VIT D DEFICIENCY, FRACTURES): Vit D, 25-Hydroxy: 35.5 ng/mL (ref 30.0–100.0)

## 2023-06-16 LAB — TSH RFX ON ABNORMAL TO FREE T4: TSH: 4.12 u[IU]/mL (ref 0.450–4.500)

## 2023-06-16 MED ORDER — MEMANTINE HCL 10 MG PO TABS: 10.0000 mg | ORAL_TABLET | Freq: Two times a day (BID) | ORAL | Status: DC

## 2023-06-16 MED ORDER — METOPROLOL SUCCINATE ER 50 MG PO TB24: 25.0000 mg | ORAL_TABLET | Freq: Every day | ORAL | 3 refills | Status: DC

## 2023-06-16 NOTE — Patient Instructions (Addendum)
Medication Instructions:  - Start Metoprolol Succinate 25mg , twice  a day. May split current pills in half until bottle is finished.    *If you need a refill on your cardiac medications before your next appointment, please call your pharmacy*     Follow-Up: At [facility], you and your health needs are our priority.  As part of our continuing mission to provide you with exceptional heart care, we have created designated Provider Care Teams.  These Care Teams include your primary Cardiologist (physician) and Advanced Practice Providers (APPs -  Physician Assistants and Nurse Practitioners) who all work together to provide you with the care you need, when you need it.  We recommend signing up for the patient portal called "MyChart".  Sign up information is provided on this After Visit Summary.  MyChart is used to connect with patients for Virtual Visits (Telemedicine).  Patients are able to view lab/test results, encounter notes, upcoming appointments, etc.  Non-urgent messages can be sent to your provider as well.   To learn more about what you can do with MyChart, go to [email or web address].    Your next appointment:   3 month(s)  The format for your next appointment:   In Person  Provider:   Edd Fabian, FNP, Rito Ehrlich, or Joni Reining, DNP, ANP

## 2023-06-17 ENCOUNTER — Other Ambulatory Visit: Payer: Self-pay | Admitting: Family Medicine

## 2023-06-17 DIAGNOSIS — J449 Chronic obstructive pulmonary disease, unspecified: Secondary | ICD-10-CM

## 2023-06-20 DIAGNOSIS — Z7901 Long term (current) use of anticoagulants: Secondary | ICD-10-CM | POA: Diagnosis not present

## 2023-06-20 DIAGNOSIS — I10 Essential (primary) hypertension: Secondary | ICD-10-CM | POA: Diagnosis not present

## 2023-06-20 DIAGNOSIS — I951 Orthostatic hypotension: Secondary | ICD-10-CM | POA: Diagnosis not present

## 2023-06-20 DIAGNOSIS — Z952 Presence of prosthetic heart valve: Secondary | ICD-10-CM | POA: Diagnosis not present

## 2023-06-20 DIAGNOSIS — Z7951 Long term (current) use of inhaled steroids: Secondary | ICD-10-CM | POA: Diagnosis not present

## 2023-06-20 DIAGNOSIS — I712 Thoracic aortic aneurysm, without rupture, unspecified: Secondary | ICD-10-CM | POA: Diagnosis not present

## 2023-06-20 DIAGNOSIS — I4821 Permanent atrial fibrillation: Secondary | ICD-10-CM | POA: Diagnosis not present

## 2023-06-20 DIAGNOSIS — L89310 Pressure ulcer of right buttock, unstageable: Secondary | ICD-10-CM | POA: Diagnosis not present

## 2023-06-20 DIAGNOSIS — Z556 Problems related to health literacy: Secondary | ICD-10-CM | POA: Diagnosis not present

## 2023-06-20 DIAGNOSIS — Z9181 History of falling: Secondary | ICD-10-CM | POA: Diagnosis not present

## 2023-06-20 DIAGNOSIS — I080 Rheumatic disorders of both mitral and aortic valves: Secondary | ICD-10-CM | POA: Diagnosis not present

## 2023-06-20 DIAGNOSIS — Z79899 Other long term (current) drug therapy: Secondary | ICD-10-CM | POA: Diagnosis not present

## 2023-06-20 DIAGNOSIS — Z87440 Personal history of urinary (tract) infections: Secondary | ICD-10-CM | POA: Diagnosis not present

## 2023-06-21 ENCOUNTER — Other Ambulatory Visit: Payer: Medicare Other

## 2023-06-22 ENCOUNTER — Other Ambulatory Visit: Payer: Medicare Other

## 2023-06-26 ENCOUNTER — Telehealth: Payer: Self-pay | Admitting: *Deleted

## 2023-06-26 ENCOUNTER — Telehealth: Payer: Self-pay

## 2023-06-26 DIAGNOSIS — I080 Rheumatic disorders of both mitral and aortic valves: Secondary | ICD-10-CM | POA: Diagnosis not present

## 2023-06-26 DIAGNOSIS — Z87440 Personal history of urinary (tract) infections: Secondary | ICD-10-CM | POA: Diagnosis not present

## 2023-06-26 DIAGNOSIS — I4821 Permanent atrial fibrillation: Secondary | ICD-10-CM | POA: Diagnosis not present

## 2023-06-26 DIAGNOSIS — Z9181 History of falling: Secondary | ICD-10-CM | POA: Diagnosis not present

## 2023-06-26 DIAGNOSIS — Z79899 Other long term (current) drug therapy: Secondary | ICD-10-CM | POA: Diagnosis not present

## 2023-06-26 DIAGNOSIS — Z556 Problems related to health literacy: Secondary | ICD-10-CM | POA: Diagnosis not present

## 2023-06-26 DIAGNOSIS — Z952 Presence of prosthetic heart valve: Secondary | ICD-10-CM | POA: Diagnosis not present

## 2023-06-26 DIAGNOSIS — I951 Orthostatic hypotension: Secondary | ICD-10-CM | POA: Diagnosis not present

## 2023-06-26 DIAGNOSIS — R2689 Other abnormalities of gait and mobility: Secondary | ICD-10-CM

## 2023-06-26 DIAGNOSIS — Z7951 Long term (current) use of inhaled steroids: Secondary | ICD-10-CM | POA: Diagnosis not present

## 2023-06-26 DIAGNOSIS — I712 Thoracic aortic aneurysm, without rupture, unspecified: Secondary | ICD-10-CM | POA: Diagnosis not present

## 2023-06-26 DIAGNOSIS — R531 Weakness: Secondary | ICD-10-CM

## 2023-06-26 DIAGNOSIS — L89310 Pressure ulcer of right buttock, unstageable: Secondary | ICD-10-CM | POA: Diagnosis not present

## 2023-06-26 DIAGNOSIS — Z7901 Long term (current) use of anticoagulants: Secondary | ICD-10-CM | POA: Diagnosis not present

## 2023-06-26 DIAGNOSIS — I10 Essential (primary) hypertension: Secondary | ICD-10-CM | POA: Diagnosis not present

## 2023-06-26 NOTE — Telephone Encounter (Signed)
Order has been placed, community message sent to Warren Wagner.  I do not know what the demographic sheet is.

## 2023-06-26 NOTE — Telephone Encounter (Signed)
HH OT from [facility] left VM requesting a Rx to be sent to [facility] for 4 wheeled Rotator walker and also include the Demographic sheet.  Please advise

## 2023-06-26 NOTE — Telephone Encounter (Signed)
Warren Wagner with [facility] calling to request ongoing PT orders for this pt.  Verbal orders given for 1x a week for 8 weeks. This will follow up with a fax.  If you need to reach [facility] her [phone]

## 2023-06-27 NOTE — Telephone Encounter (Signed)
Put in orders and request for 4 wheeled rollator walker to [facility].  Received the following response as they tried to process it:  Oda Cogan, PA; Santina Evans  Patient received a RW  02/05/2022 so insurance will not cover a rollator at this time.

## 2023-06-28 DIAGNOSIS — I080 Rheumatic disorders of both mitral and aortic valves: Secondary | ICD-10-CM | POA: Diagnosis not present

## 2023-06-28 DIAGNOSIS — Z7951 Long term (current) use of inhaled steroids: Secondary | ICD-10-CM | POA: Diagnosis not present

## 2023-06-28 DIAGNOSIS — I10 Essential (primary) hypertension: Secondary | ICD-10-CM | POA: Diagnosis not present

## 2023-06-28 DIAGNOSIS — I4821 Permanent atrial fibrillation: Secondary | ICD-10-CM | POA: Diagnosis not present

## 2023-06-28 DIAGNOSIS — Z556 Problems related to health literacy: Secondary | ICD-10-CM | POA: Diagnosis not present

## 2023-06-28 DIAGNOSIS — L89310 Pressure ulcer of right buttock, unstageable: Secondary | ICD-10-CM | POA: Diagnosis not present

## 2023-06-28 DIAGNOSIS — I712 Thoracic aortic aneurysm, without rupture, unspecified: Secondary | ICD-10-CM | POA: Diagnosis not present

## 2023-06-28 DIAGNOSIS — Z9181 History of falling: Secondary | ICD-10-CM | POA: Diagnosis not present

## 2023-06-28 DIAGNOSIS — Z87440 Personal history of urinary (tract) infections: Secondary | ICD-10-CM | POA: Diagnosis not present

## 2023-06-28 DIAGNOSIS — Z7901 Long term (current) use of anticoagulants: Secondary | ICD-10-CM | POA: Diagnosis not present

## 2023-06-28 DIAGNOSIS — Z952 Presence of prosthetic heart valve: Secondary | ICD-10-CM | POA: Diagnosis not present

## 2023-06-28 DIAGNOSIS — I951 Orthostatic hypotension: Secondary | ICD-10-CM | POA: Diagnosis not present

## 2023-06-28 DIAGNOSIS — Z79899 Other long term (current) drug therapy: Secondary | ICD-10-CM | POA: Diagnosis not present

## 2023-06-30 DIAGNOSIS — I712 Thoracic aortic aneurysm, without rupture, unspecified: Secondary | ICD-10-CM | POA: Diagnosis not present

## 2023-06-30 DIAGNOSIS — Z7901 Long term (current) use of anticoagulants: Secondary | ICD-10-CM | POA: Diagnosis not present

## 2023-06-30 DIAGNOSIS — Z79899 Other long term (current) drug therapy: Secondary | ICD-10-CM | POA: Diagnosis not present

## 2023-06-30 DIAGNOSIS — I10 Essential (primary) hypertension: Secondary | ICD-10-CM | POA: Diagnosis not present

## 2023-06-30 DIAGNOSIS — Z7951 Long term (current) use of inhaled steroids: Secondary | ICD-10-CM | POA: Diagnosis not present

## 2023-06-30 DIAGNOSIS — Z556 Problems related to health literacy: Secondary | ICD-10-CM | POA: Diagnosis not present

## 2023-06-30 DIAGNOSIS — I080 Rheumatic disorders of both mitral and aortic valves: Secondary | ICD-10-CM | POA: Diagnosis not present

## 2023-06-30 DIAGNOSIS — L89310 Pressure ulcer of right buttock, unstageable: Secondary | ICD-10-CM | POA: Diagnosis not present

## 2023-06-30 DIAGNOSIS — I951 Orthostatic hypotension: Secondary | ICD-10-CM | POA: Diagnosis not present

## 2023-06-30 DIAGNOSIS — I4821 Permanent atrial fibrillation: Secondary | ICD-10-CM | POA: Diagnosis not present

## 2023-06-30 DIAGNOSIS — Z87440 Personal history of urinary (tract) infections: Secondary | ICD-10-CM | POA: Diagnosis not present

## 2023-06-30 DIAGNOSIS — Z9181 History of falling: Secondary | ICD-10-CM | POA: Diagnosis not present

## 2023-06-30 DIAGNOSIS — Z952 Presence of prosthetic heart valve: Secondary | ICD-10-CM | POA: Diagnosis not present

## 2023-07-01 ENCOUNTER — Other Ambulatory Visit: Payer: Self-pay | Admitting: Neurology

## 2023-07-03 DIAGNOSIS — Z79899 Other long term (current) drug therapy: Secondary | ICD-10-CM | POA: Diagnosis not present

## 2023-07-03 DIAGNOSIS — Z7901 Long term (current) use of anticoagulants: Secondary | ICD-10-CM | POA: Diagnosis not present

## 2023-07-03 DIAGNOSIS — I080 Rheumatic disorders of both mitral and aortic valves: Secondary | ICD-10-CM | POA: Diagnosis not present

## 2023-07-03 DIAGNOSIS — L89310 Pressure ulcer of right buttock, unstageable: Secondary | ICD-10-CM | POA: Diagnosis not present

## 2023-07-03 DIAGNOSIS — Z7951 Long term (current) use of inhaled steroids: Secondary | ICD-10-CM | POA: Diagnosis not present

## 2023-07-03 DIAGNOSIS — Z87440 Personal history of urinary (tract) infections: Secondary | ICD-10-CM | POA: Diagnosis not present

## 2023-07-03 DIAGNOSIS — I712 Thoracic aortic aneurysm, without rupture, unspecified: Secondary | ICD-10-CM | POA: Diagnosis not present

## 2023-07-03 DIAGNOSIS — I10 Essential (primary) hypertension: Secondary | ICD-10-CM | POA: Diagnosis not present

## 2023-07-03 DIAGNOSIS — Z952 Presence of prosthetic heart valve: Secondary | ICD-10-CM | POA: Diagnosis not present

## 2023-07-03 DIAGNOSIS — I951 Orthostatic hypotension: Secondary | ICD-10-CM | POA: Diagnosis not present

## 2023-07-03 DIAGNOSIS — I4821 Permanent atrial fibrillation: Secondary | ICD-10-CM | POA: Diagnosis not present

## 2023-07-03 DIAGNOSIS — Z556 Problems related to health literacy: Secondary | ICD-10-CM | POA: Diagnosis not present

## 2023-07-03 DIAGNOSIS — Z9181 History of falling: Secondary | ICD-10-CM | POA: Diagnosis not present

## 2023-07-04 DIAGNOSIS — K080 Exfoliation of teeth due to systemic causes: Secondary | ICD-10-CM | POA: Diagnosis not present

## 2023-07-05 DIAGNOSIS — Z9181 History of falling: Secondary | ICD-10-CM | POA: Diagnosis not present

## 2023-07-05 DIAGNOSIS — Z556 Problems related to health literacy: Secondary | ICD-10-CM | POA: Diagnosis not present

## 2023-07-05 DIAGNOSIS — Z79899 Other long term (current) drug therapy: Secondary | ICD-10-CM | POA: Diagnosis not present

## 2023-07-05 DIAGNOSIS — Z7951 Long term (current) use of inhaled steroids: Secondary | ICD-10-CM | POA: Diagnosis not present

## 2023-07-05 DIAGNOSIS — I10 Essential (primary) hypertension: Secondary | ICD-10-CM | POA: Diagnosis not present

## 2023-07-05 DIAGNOSIS — Z952 Presence of prosthetic heart valve: Secondary | ICD-10-CM | POA: Diagnosis not present

## 2023-07-05 DIAGNOSIS — I951 Orthostatic hypotension: Secondary | ICD-10-CM | POA: Diagnosis not present

## 2023-07-05 DIAGNOSIS — L89310 Pressure ulcer of right buttock, unstageable: Secondary | ICD-10-CM | POA: Diagnosis not present

## 2023-07-05 DIAGNOSIS — I080 Rheumatic disorders of both mitral and aortic valves: Secondary | ICD-10-CM | POA: Diagnosis not present

## 2023-07-05 DIAGNOSIS — I4821 Permanent atrial fibrillation: Secondary | ICD-10-CM | POA: Diagnosis not present

## 2023-07-05 DIAGNOSIS — Z7901 Long term (current) use of anticoagulants: Secondary | ICD-10-CM | POA: Diagnosis not present

## 2023-07-05 DIAGNOSIS — Z87440 Personal history of urinary (tract) infections: Secondary | ICD-10-CM | POA: Diagnosis not present

## 2023-07-05 DIAGNOSIS — I712 Thoracic aortic aneurysm, without rupture, unspecified: Secondary | ICD-10-CM | POA: Diagnosis not present

## 2023-07-05 NOTE — Progress Notes (Signed)
[facility] - Clinic Note  07/10/2023     CHIEF COMPLAINT Patient presents for Retina Follow Up   HISTORY OF PRESENT ILLNESS: Warren Wagner. is a 88 y.o. male who presents to the clinic today for:   HPI     Retina Follow Up   Patient presents with  Wet AMD.  In both eyes.  This started 5 weeks ago.  Duration of 5 weeks.  Since onset it is stable.  I, the attending physician,  performed the HPI with the patient and updated documentation appropriately.        Comments   5 week retina follow up and I'VE OD pt is reporting no vision changes noticed he denies flashes or floaters       Last edited by Rennis Chris, MD on 07/10/2023  5:54 PM.    Patient states the vision is about the same as last visit.  Referring physician: Melida Quitter, PA [address] [place],  [place] [address]  HISTORICAL INFORMATION:   Selected notes from the MEDICAL RECORD NUMBER Warren Wagner pt transferring care due to insurance LEE: 03.27.23 [BCVA 20/40 OD, CF OS] Ocular Hx- ex ARMD OU; last injection was IVA OS on 05.24.21  PMH-    CURRENT MEDICATIONS: No current outpatient medications on file. (Ophthalmic Drugs)   No current facility-administered medications for this visit. (Ophthalmic Drugs)   Current Outpatient Medications (Other)  Medication Sig   acetaminophen (TYLENOL) 500 MG tablet Take 1,000 mg by mouth every 6 (six) hours as needed for moderate pain or headache.   amoxicillin (AMOXIL) 500 MG tablet Take 4 tablets (2,000 mg total) by mouth as directed. 1 hour prior to dental work including cleanings   docusate sodium (COLACE) 100 MG capsule Take 100 mg by mouth daily.   EQ ALLERGY RELIEF, CETIRIZINE, 10 MG tablet Take 1 tablet by mouth once daily   finasteride (PROSCAR) 5 MG tablet Take 1 tablet by mouth once daily   furosemide (LASIX) 40 MG tablet Take 1 tablet (40 mg total) by mouth daily. (Patient taking differently: Take 20-40 mg by mouth See admin  instructions. Taking 20 mg daily except on a day when weight gain is noticed can take 40 mg daily)   memantine (NAMENDA) 10 MG tablet Take 1 tablet by mouth twice daily   metoprolol succinate (TOPROL-XL) 50 MG 24 hr tablet Take 0.5 tablets (25 mg total) by mouth daily. Take with or immediately following a meal. May split current pills in half until bottle is gone.   Multiple Vitamins-Minerals (MULTIVITAMIN WITH MINERALS) tablet Take 1 tablet by mouth daily.   Multiple Vitamins-Minerals (OCUVITE PO) Take 1 tablet by mouth daily.   OMEGA-3 FATTY ACIDS PO Take 500 mg by mouth daily.   QUEtiapine (SEROQUEL) 25 MG tablet Take 0.5 tablets (12.5 mg total) by mouth 2 (two) times daily.   sodium chloride (OCEAN) 0.65 % SOLN nasal spray Place 1 spray into both nostrils daily as needed for congestion.   TRELEGY ELLIPTA 100-62.5-25 MCG/ACT AEPB Inhale 1 puff by mouth once daily   warfarin (COUMADIN) 5 MG tablet TAKE 1/2 TO 1 (ONE-HALF TO ONE) TABLET BY MOUTH ONCE DAILY AS DIRECTED BY  ANTICOAGULATION  CLINIC.   No current facility-administered medications for this visit. (Other)   REVIEW OF SYSTEMS: ROS   Positive for: Cardiovascular, Eyes Negative for: Constitutional, Gastrointestinal, Neurological, Skin, Genitourinary, Musculoskeletal, HENT, Endocrine, Respiratory, Psychiatric, Allergic/Imm, Heme/Lymph Last edited by Etheleen Mayhew, COT  on 07/10/2023  9:04 AM.        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 Clifton James & Dr. Laneta Simmers   Severe aortic stenosis    Umbilical hernia 07/27/2012   Past Surgical History:  Procedure Laterality Date    CARDIOVERSION  10/10/2006   cataracts Bilateral    HERNIA REPAIR  09/10/2012   large umbilical hernia   INSERTION OF MESH  09/10/2012   Procedure: INSERTION OF MESH;  Surgeon: [name], MD;  Location: [facility] OR;  Service: General;  Laterality: N/A;   INTRAOPERATIVE TRANSTHORACIC ECHOCARDIOGRAM N/A 10/18/2022   Procedure: INTRAOPERATIVE TRANSTHORACIC ECHOCARDIOGRAM;  Surgeon: Kathleene Hazel, MD;  Location: [facility] INVASIVE CV LAB;  Service: Open Heart Surgery;  Laterality: N/A;   macular degeneration Bilateral    RIGHT HEART CATH AND CORONARY ANGIOGRAPHY N/A 09/09/2022   Procedure: RIGHT HEART CATH AND CORONARY ANGIOGRAPHY;  Surgeon: Kathleene Hazel, MD;  Location: [facility] INVASIVE CV LAB;  Service: Cardiovascular;  Laterality: N/A;   TONSILLECTOMY     "maybe" (09/10/2012)   TRANSCATHETER AORTIC VALVE REPLACEMENT, TRANSFEMORAL N/A 10/18/2022   Procedure: Transcatheter Aortic Valve Replacement, Transfemoral;  Surgeon: Kathleene Hazel, MD;  Location: [facility] INVASIVE CV LAB;  Service: Open Heart Surgery;  Laterality: N/A;   UMBILICAL HERNIA REPAIR  09/10/2012   Procedure: HERNIA REPAIR UMBILICAL ADULT;  Surgeon: [name], MD;  Location: [facility] OR;  Service: General;  Laterality: N/A;   FAMILY HISTORY Family History  Problem Relation Age of Onset   Heart disease Mother    Stroke Father    SOCIAL HISTORY Social History   Tobacco Use   Smoking status: Former    Current packs/day: 0.00    Average packs/day: 1.5 packs/day for 10.0 years (15.0 ttl pk-yrs)    Types: Cigarettes    Start date: 07/27/1972    Quit date: 07/27/1982    Years since quitting: 40.9   Smokeless tobacco: Never  Vaping Use   Vaping status: Never Used  Substance Use Topics   Drug use: No       OPHTHALMIC EXAM:  Base Eye Exam     Visual Acuity (Snellen - Linear)       Right Left   Dist [place] 20/200 -1 CF at 3'   Dist ph [place] 20/150 -2 NI         Tonometry (Tonopen, 9:09 AM)       Right Left    Pressure 14 14         Pupils       Pupils Dark Light Shape React APD   Right PERRL 2 1 Round Brisk None   Left PERRL 2 1 Round Brisk None         Extraocular Movement       Right Left    Full, Ortho Full, Ortho         Neuro/Psych     Oriented x3: Yes   Mood/Affect: Normal         Dilation     Both eyes: 2.5% Phenylephrine @ 9:09 AM  Slit Lamp and Fundus Exam     External Exam       Right Left   External Normal Normal         Slit Lamp Exam       Right Left   Lids/Lashes Dermatochalasis - upper lid, Ptosis Dermatochalasis - upper lid, mild MGD   Conjunctiva/Sclera White and quiet White and quiet   Cornea 1-2+ Punctate epithelial erosions, mild arcus, well healed cataract wound, tear film debris Trace tear film debris, well healed cataract wound, arcus   Anterior Chamber deep and clear deep and clear   Iris Round and dilated round and poorly dilated   Lens PC IOL in good position, 1-2+ Posterior capsular opacification PC IOL in good position, 2-3+ Posterior capsular opacification   Anterior Vitreous mild syneresis Vitreous syneresis         Fundus Exam       Right Left   Posterior Vitreous Posterior vitreous detachment Posterior vitreous detachment, vitreous condensations   Disc 2+Pallor, Sharp rim, Compact 2+Pallor, Sharp rim   C/D Ratio 0.2 0.3   Macula Blunted foveal reflex, refractile Drusen, RPE mottling, clumping and atrophy, focal central edema- stably improved, no frank heme Flat, blunted foveal reflex, refractile Drusen, RPE mottling, clumping and atrophy, central GA, no frank heme   Vessels attenuated, Tortuous attenuated, mild tortuosity   Periphery Attached, reticular degeneration, no heme Attached, reticlar degeneration, no heme           Refraction     Wearing Rx       Sphere Cylinder Axis Add   Right -0.75 +0.75 087 +2.75   Left -0.75 +0.75 083 +2.75           IMAGING AND PROCEDURES  Imaging and  Procedures for 07/10/2023  OCT, Retina - OU - Both Eyes       Right Eye Quality was good. Scan locations included subfoveal. Central Foveal Thickness: 222. Progression has been stable. Findings include normal foveal contour, no IRF, retinal drusen , subretinal hyper-reflective material, pigment epithelial detachment, subretinal fluid, outer retinal atrophy (Patchy central ORA with PEDs, central pocket of SRF -- stably improved).   Left Eye Quality was poor. Scan locations included subfoveal. Central Foveal Thickness: 242. Progression has been stable. Findings include no IRF, no SRF, abnormal foveal contour, retinal drusen , outer retinal tubulation, pigment epithelial detachment, outer retinal atrophy (Central ORA / GA with PEDs and ORT).   Notes *Images captured and stored on drive  Diagnosis / Impression:  OD: exudative ARMD - Patchy central ORA with PEDs, central pocket of SRF -- stably improved OS: non exudative ARMD - Central ORA / GA with PEDs and ORT  Clinical management:  See below  Abbreviations: NFP - Normal foveal profile. CME - cystoid macular edema. PED - pigment epithelial detachment. IRF - intraretinal fluid. SRF - subretinal fluid. EZ - ellipsoid zone. ERM - epiretinal membrane. ORA - outer retinal atrophy. ORT - outer retinal tubulation. SRHM - subretinal hyper-reflective material. IRHM - intraretinal hyper-reflective material      Intravitreal Injection, Pharmacologic Agent - OD - Right Eye       Time Out 07/10/2023. 9:43 AM. Confirmed correct patient, procedure, site, and patient consented.   Anesthesia Topical anesthesia was used. Anesthetic medications included Lidocaine 2%, Proparacaine 0.5%.   Procedure Preparation included 5% betadine to ocular surface, eyelid speculum. A (32g) needle was used.   Injection: 2 mg aflibercept 2 MG/0.05ML   Route: Intravitreal, Site: Right Eye  NDC: [id number], Lot: [id number], Expiration date: 08/09/2024, Waste: 0 mL    Post-op Post injection exam found visual acuity of at least counting fingers. The patient tolerated the procedure well. There were no complications. The patient received written and verbal post procedure care education.            ASSESSMENT/PLAN:    ICD-10-CM   1. Exudative age-related macular degeneration of right eye with active choroidal neovascularization (HCC)  H35.3211 OCT, Retina - OU - Both Eyes    Intravitreal Injection, Pharmacologic Agent - OD - Right Eye    aflibercept (EYLEA) SOLN 2 mg    2. 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 Essential hypertension  I10     4. Hypertensive retinopathy of both eyes  H35.033     5. Pseudophakia, both eyes  Z96.1       Exudative age related macular degeneration, OD  - previous Dr. Luciana Axe pt here due to insurance  - pt reports history of injections OD w/ Warren Wagner, but unable to see record of that - here s/p IVA OD #1 (02.07.24), #2 (03.13.24), #3 (04.17.24), #4 (05.31.24) -- IVA resistance  - s/p IVE OD #1 (06.28.24), #2 (07.29.24), #3 (08.26.24)  - BCVA OD stable at 20/150  - OCT OD: Patchy central ORA with PEDs, central pocket of SRF -- stably improved at 5 wks  - recommend IVE OD #4 today, 09.30.24 w/ f/u up extended to 6 wks - pt wishes to proceed with injection - RBA of procedure discussed, questions answered - IVE informed consent obtained and signed, 06.28.24 (OD) - see procedure note   - f/u in 6 wks -- DFE/OCT, possible injection  2. Age related macular degeneration, non-exudative, left eye  - advanced stage w/ significant central GA  - BCVA CF 3' -- stable   - history of IVA OS w/ Warren Wagner, last 05.24.21  - OCT OS shows Central ORA / GA with PEDs and ORT  - Recommend amsler grid monitoring  - monitor  3,4. Hypertensive retinopathy OU - discussed importance of tight BP control - monitor  5. Pseudophakia OU  - s/p CE/IOL (Dr.  Nile Riggs)  - IOL in good position, doing well  - monitor  Ophthalmic Meds Ordered this visit:  Meds ordered this encounter  Medications   aflibercept (EYLEA) SOLN 2 mg     Return in about 6 weeks (around 08/21/2023) for f/u Ex. AMD OD , DFE, OCT, Possible, IVE, OD.  There are no Patient Instructions on file for this visit.   This document serves as a record of services personally performed by Karie Chimera, MD, PhD. It was created on their behalf by Annalee Genta, COMT. The creation of this record is the provider's dictation and/or activities during the visit.  Electronically signed by: Annalee Genta, COMT 07/10/23 5:58 PM  This document serves as a record of services personally performed by Karie Chimera, MD, PhD. It was created on their behalf by Charlette Caffey, COT an ophthalmic technician. The creation of this record is the provider's dictation and/or activities during the visit.    Electronically signed by:  Charlette Caffey, COT  07/10/23 5:58 PM   Karie Chimera, M.D., Ph.D. Diseases & Surgery of the Retina and Vitreous [facility]  I have reviewed the above documentation for accuracy and completeness, and I agree with the above. Karie Chimera, M.D., Ph.D.  07/10/23 5:59 PM   Abbreviations: M myopia (nearsighted); A astigmatism; H hyperopia (farsighted); P presbyopia; Mrx spectacle prescription;  CTL contact lenses; OD right eye; OS left eye; OU both eyes  XT exotropia; ET esotropia; PEK punctate epithelial keratitis; PEE punctate epithelial erosions; DES dry eye syndrome; MGD meibomian gland dysfunction; ATs artificial tears; PFAT's preservative free artificial tears; NSC nuclear sclerotic cataract; PSC posterior subcapsular cataract; ERM epi-retinal membrane; PVD posterior vitreous detachment; RD retinal detachment; DM diabetes mellitus; DR diabetic retinopathy; NPDR non-proliferative diabetic retinopathy; PDR proliferative diabetic  retinopathy; CSME clinically significant macular edema; DME diabetic macular edema; dbh dot blot hemorrhages; CWS cotton wool spot; POAG primary open angle glaucoma; C/D cup-to-disc ratio; HVF humphrey visual field; GVF goldmann visual field; OCT optical coherence tomography; IOP intraocular pressure; BRVO Branch retinal vein occlusion; CRVO central retinal vein occlusion; CRAO central retinal artery occlusion; BRAO branch retinal artery occlusion; RT retinal tear; SB scleral buckle; PPV pars plana vitrectomy; VH Vitreous hemorrhage; PRP panretinal laser photocoagulation; IVK intravitreal kenalog; VMT vitreomacular traction; MH Macular hole;  NVD neovascularization of the disc; NVE neovascularization elsewhere; AREDS age related eye disease study; ARMD age related macular degeneration; POAG primary open angle glaucoma; EBMD epithelial/anterior basement membrane dystrophy; ACIOL anterior chamber intraocular lens; IOL intraocular lens; PCIOL posterior chamber intraocular lens; Phaco/IOL phacoemulsification with intraocular lens placement; PRK photorefractive keratectomy; LASIK laser assisted in situ keratomileusis; HTN hypertension; DM diabetes mellitus; COPD chronic obstructive pulmonary disease

## 2023-07-10 ENCOUNTER — Ambulatory Visit (INDEPENDENT_AMBULATORY_CARE_PROVIDER_SITE_OTHER): Payer: Medicare Other | Admitting: Ophthalmology

## 2023-07-10 ENCOUNTER — Encounter (INDEPENDENT_AMBULATORY_CARE_PROVIDER_SITE_OTHER): Payer: Self-pay | Admitting: Ophthalmology

## 2023-07-10 DIAGNOSIS — H353124 Nonexudative age-related macular degeneration, left eye, advanced atrophic with subfoveal involvement: Secondary | ICD-10-CM

## 2023-07-10 DIAGNOSIS — I10 Essential (primary) hypertension: Secondary | ICD-10-CM | POA: Diagnosis not present

## 2023-07-10 DIAGNOSIS — H353211 Exudative age-related macular degeneration, right eye, with active choroidal neovascularization: Secondary | ICD-10-CM | POA: Diagnosis not present

## 2023-07-10 DIAGNOSIS — Z961 Presence of intraocular lens: Secondary | ICD-10-CM

## 2023-07-10 DIAGNOSIS — H35033 Hypertensive retinopathy, bilateral: Secondary | ICD-10-CM

## 2023-07-10 MED ORDER — AFLIBERCEPT 2MG/0.05ML IZ SOLN FOR KALEIDOSCOPE
2.0000 mg | INTRAVITREAL | Status: AC | PRN
Start: 2023-07-10 — End: 2023-07-10
  Administered 2023-07-10: 2 mg via INTRAVITREAL

## 2023-07-11 ENCOUNTER — Ambulatory Visit: Payer: Medicare Other | Attending: Cardiology

## 2023-07-11 DIAGNOSIS — I4891 Unspecified atrial fibrillation: Secondary | ICD-10-CM | POA: Diagnosis not present

## 2023-07-11 DIAGNOSIS — Z5181 Encounter for therapeutic drug level monitoring: Secondary | ICD-10-CM | POA: Diagnosis not present

## 2023-07-11 DIAGNOSIS — I4821 Permanent atrial fibrillation: Secondary | ICD-10-CM

## 2023-07-11 DIAGNOSIS — Z7901 Long term (current) use of anticoagulants: Secondary | ICD-10-CM

## 2023-07-11 LAB — POCT INR: INR: 2.6 (ref 2.0–3.0)

## 2023-07-11 NOTE — Patient Instructions (Signed)
Description   Continue taking 1 tablet daily except 0.5 tablet on Sunday, Monday, Wednesday and Friday.  Stay consistent with greens (2 times per week)  Recheck INR 6 weeks  Coumadin Clinic [phone]

## 2023-07-12 DIAGNOSIS — Z7951 Long term (current) use of inhaled steroids: Secondary | ICD-10-CM | POA: Diagnosis not present

## 2023-07-12 DIAGNOSIS — Z556 Problems related to health literacy: Secondary | ICD-10-CM | POA: Diagnosis not present

## 2023-07-12 DIAGNOSIS — Z952 Presence of prosthetic heart valve: Secondary | ICD-10-CM | POA: Diagnosis not present

## 2023-07-12 DIAGNOSIS — Z7901 Long term (current) use of anticoagulants: Secondary | ICD-10-CM | POA: Diagnosis not present

## 2023-07-12 DIAGNOSIS — Z79899 Other long term (current) drug therapy: Secondary | ICD-10-CM | POA: Diagnosis not present

## 2023-07-12 DIAGNOSIS — I951 Orthostatic hypotension: Secondary | ICD-10-CM | POA: Diagnosis not present

## 2023-07-12 DIAGNOSIS — I10 Essential (primary) hypertension: Secondary | ICD-10-CM | POA: Diagnosis not present

## 2023-07-12 DIAGNOSIS — I080 Rheumatic disorders of both mitral and aortic valves: Secondary | ICD-10-CM | POA: Diagnosis not present

## 2023-07-12 DIAGNOSIS — I4821 Permanent atrial fibrillation: Secondary | ICD-10-CM | POA: Diagnosis not present

## 2023-07-12 DIAGNOSIS — Z87440 Personal history of urinary (tract) infections: Secondary | ICD-10-CM | POA: Diagnosis not present

## 2023-07-12 DIAGNOSIS — I712 Thoracic aortic aneurysm, without rupture, unspecified: Secondary | ICD-10-CM | POA: Diagnosis not present

## 2023-07-12 DIAGNOSIS — Z9181 History of falling: Secondary | ICD-10-CM | POA: Diagnosis not present

## 2023-07-12 DIAGNOSIS — L89310 Pressure ulcer of right buttock, unstageable: Secondary | ICD-10-CM | POA: Diagnosis not present

## 2023-07-14 ENCOUNTER — Other Ambulatory Visit: Payer: Self-pay | Admitting: Family Medicine

## 2023-07-14 DIAGNOSIS — L89310 Pressure ulcer of right buttock, unstageable: Secondary | ICD-10-CM | POA: Diagnosis not present

## 2023-07-14 DIAGNOSIS — Z87440 Personal history of urinary (tract) infections: Secondary | ICD-10-CM | POA: Diagnosis not present

## 2023-07-14 DIAGNOSIS — I712 Thoracic aortic aneurysm, without rupture, unspecified: Secondary | ICD-10-CM | POA: Diagnosis not present

## 2023-07-14 DIAGNOSIS — I10 Essential (primary) hypertension: Secondary | ICD-10-CM | POA: Diagnosis not present

## 2023-07-14 DIAGNOSIS — I4821 Permanent atrial fibrillation: Secondary | ICD-10-CM | POA: Diagnosis not present

## 2023-07-14 DIAGNOSIS — Z7951 Long term (current) use of inhaled steroids: Secondary | ICD-10-CM | POA: Diagnosis not present

## 2023-07-14 DIAGNOSIS — J449 Chronic obstructive pulmonary disease, unspecified: Secondary | ICD-10-CM

## 2023-07-14 DIAGNOSIS — Z7901 Long term (current) use of anticoagulants: Secondary | ICD-10-CM | POA: Diagnosis not present

## 2023-07-14 DIAGNOSIS — Z556 Problems related to health literacy: Secondary | ICD-10-CM | POA: Diagnosis not present

## 2023-07-14 DIAGNOSIS — Z79899 Other long term (current) drug therapy: Secondary | ICD-10-CM | POA: Diagnosis not present

## 2023-07-14 DIAGNOSIS — I951 Orthostatic hypotension: Secondary | ICD-10-CM | POA: Diagnosis not present

## 2023-07-14 DIAGNOSIS — I080 Rheumatic disorders of both mitral and aortic valves: Secondary | ICD-10-CM | POA: Diagnosis not present

## 2023-07-14 DIAGNOSIS — Z9181 History of falling: Secondary | ICD-10-CM | POA: Diagnosis not present

## 2023-07-14 DIAGNOSIS — Z952 Presence of prosthetic heart valve: Secondary | ICD-10-CM | POA: Diagnosis not present

## 2023-07-16 ENCOUNTER — Other Ambulatory Visit: Payer: Self-pay | Admitting: Family Medicine

## 2023-07-16 DIAGNOSIS — J3089 Other allergic rhinitis: Secondary | ICD-10-CM

## 2023-07-18 DIAGNOSIS — Z87440 Personal history of urinary (tract) infections: Secondary | ICD-10-CM | POA: Diagnosis not present

## 2023-07-18 DIAGNOSIS — L89310 Pressure ulcer of right buttock, unstageable: Secondary | ICD-10-CM | POA: Diagnosis not present

## 2023-07-18 DIAGNOSIS — Z556 Problems related to health literacy: Secondary | ICD-10-CM | POA: Diagnosis not present

## 2023-07-18 DIAGNOSIS — I10 Essential (primary) hypertension: Secondary | ICD-10-CM | POA: Diagnosis not present

## 2023-07-18 DIAGNOSIS — I080 Rheumatic disorders of both mitral and aortic valves: Secondary | ICD-10-CM | POA: Diagnosis not present

## 2023-07-18 DIAGNOSIS — I712 Thoracic aortic aneurysm, without rupture, unspecified: Secondary | ICD-10-CM | POA: Diagnosis not present

## 2023-07-18 DIAGNOSIS — Z9181 History of falling: Secondary | ICD-10-CM | POA: Diagnosis not present

## 2023-07-18 DIAGNOSIS — Z79899 Other long term (current) drug therapy: Secondary | ICD-10-CM | POA: Diagnosis not present

## 2023-07-18 DIAGNOSIS — Z7901 Long term (current) use of anticoagulants: Secondary | ICD-10-CM | POA: Diagnosis not present

## 2023-07-18 DIAGNOSIS — Z7951 Long term (current) use of inhaled steroids: Secondary | ICD-10-CM | POA: Diagnosis not present

## 2023-07-18 DIAGNOSIS — Z952 Presence of prosthetic heart valve: Secondary | ICD-10-CM | POA: Diagnosis not present

## 2023-07-18 DIAGNOSIS — I951 Orthostatic hypotension: Secondary | ICD-10-CM | POA: Diagnosis not present

## 2023-07-18 DIAGNOSIS — I4821 Permanent atrial fibrillation: Secondary | ICD-10-CM | POA: Diagnosis not present

## 2023-07-19 DIAGNOSIS — I712 Thoracic aortic aneurysm, without rupture, unspecified: Secondary | ICD-10-CM | POA: Diagnosis not present

## 2023-07-19 DIAGNOSIS — Z952 Presence of prosthetic heart valve: Secondary | ICD-10-CM | POA: Diagnosis not present

## 2023-07-19 DIAGNOSIS — L89310 Pressure ulcer of right buttock, unstageable: Secondary | ICD-10-CM | POA: Diagnosis not present

## 2023-07-19 DIAGNOSIS — Z556 Problems related to health literacy: Secondary | ICD-10-CM | POA: Diagnosis not present

## 2023-07-19 DIAGNOSIS — Z7951 Long term (current) use of inhaled steroids: Secondary | ICD-10-CM | POA: Diagnosis not present

## 2023-07-19 DIAGNOSIS — I4821 Permanent atrial fibrillation: Secondary | ICD-10-CM | POA: Diagnosis not present

## 2023-07-19 DIAGNOSIS — Z7901 Long term (current) use of anticoagulants: Secondary | ICD-10-CM | POA: Diagnosis not present

## 2023-07-19 DIAGNOSIS — I080 Rheumatic disorders of both mitral and aortic valves: Secondary | ICD-10-CM | POA: Diagnosis not present

## 2023-07-19 DIAGNOSIS — Z87440 Personal history of urinary (tract) infections: Secondary | ICD-10-CM | POA: Diagnosis not present

## 2023-07-19 DIAGNOSIS — Z79899 Other long term (current) drug therapy: Secondary | ICD-10-CM | POA: Diagnosis not present

## 2023-07-19 DIAGNOSIS — I951 Orthostatic hypotension: Secondary | ICD-10-CM | POA: Diagnosis not present

## 2023-07-19 DIAGNOSIS — Z9181 History of falling: Secondary | ICD-10-CM | POA: Diagnosis not present

## 2023-07-19 DIAGNOSIS — I10 Essential (primary) hypertension: Secondary | ICD-10-CM | POA: Diagnosis not present

## 2023-07-24 DIAGNOSIS — Z556 Problems related to health literacy: Secondary | ICD-10-CM | POA: Diagnosis not present

## 2023-07-24 DIAGNOSIS — Z87440 Personal history of urinary (tract) infections: Secondary | ICD-10-CM | POA: Diagnosis not present

## 2023-07-24 DIAGNOSIS — I4821 Permanent atrial fibrillation: Secondary | ICD-10-CM | POA: Diagnosis not present

## 2023-07-24 DIAGNOSIS — I951 Orthostatic hypotension: Secondary | ICD-10-CM | POA: Diagnosis not present

## 2023-07-24 DIAGNOSIS — Z9181 History of falling: Secondary | ICD-10-CM | POA: Diagnosis not present

## 2023-07-24 DIAGNOSIS — Z7951 Long term (current) use of inhaled steroids: Secondary | ICD-10-CM | POA: Diagnosis not present

## 2023-07-24 DIAGNOSIS — I712 Thoracic aortic aneurysm, without rupture, unspecified: Secondary | ICD-10-CM | POA: Diagnosis not present

## 2023-07-24 DIAGNOSIS — I10 Essential (primary) hypertension: Secondary | ICD-10-CM | POA: Diagnosis not present

## 2023-07-24 DIAGNOSIS — I080 Rheumatic disorders of both mitral and aortic valves: Secondary | ICD-10-CM | POA: Diagnosis not present

## 2023-07-24 DIAGNOSIS — Z79899 Other long term (current) drug therapy: Secondary | ICD-10-CM | POA: Diagnosis not present

## 2023-07-24 DIAGNOSIS — Z7901 Long term (current) use of anticoagulants: Secondary | ICD-10-CM | POA: Diagnosis not present

## 2023-07-24 DIAGNOSIS — L89310 Pressure ulcer of right buttock, unstageable: Secondary | ICD-10-CM | POA: Diagnosis not present

## 2023-07-24 DIAGNOSIS — Z952 Presence of prosthetic heart valve: Secondary | ICD-10-CM | POA: Diagnosis not present

## 2023-07-25 ENCOUNTER — Telehealth: Payer: Self-pay

## 2023-07-25 DIAGNOSIS — Z87440 Personal history of urinary (tract) infections: Secondary | ICD-10-CM | POA: Diagnosis not present

## 2023-07-25 DIAGNOSIS — Z952 Presence of prosthetic heart valve: Secondary | ICD-10-CM | POA: Diagnosis not present

## 2023-07-25 DIAGNOSIS — K080 Exfoliation of teeth due to systemic causes: Secondary | ICD-10-CM | POA: Diagnosis not present

## 2023-07-25 DIAGNOSIS — I951 Orthostatic hypotension: Secondary | ICD-10-CM | POA: Diagnosis not present

## 2023-07-25 DIAGNOSIS — I4821 Permanent atrial fibrillation: Secondary | ICD-10-CM | POA: Diagnosis not present

## 2023-07-25 DIAGNOSIS — Z79899 Other long term (current) drug therapy: Secondary | ICD-10-CM | POA: Diagnosis not present

## 2023-07-25 DIAGNOSIS — I10 Essential (primary) hypertension: Secondary | ICD-10-CM | POA: Diagnosis not present

## 2023-07-25 DIAGNOSIS — Z556 Problems related to health literacy: Secondary | ICD-10-CM | POA: Diagnosis not present

## 2023-07-25 DIAGNOSIS — Z7951 Long term (current) use of inhaled steroids: Secondary | ICD-10-CM | POA: Diagnosis not present

## 2023-07-25 DIAGNOSIS — Z9181 History of falling: Secondary | ICD-10-CM | POA: Diagnosis not present

## 2023-07-25 DIAGNOSIS — I712 Thoracic aortic aneurysm, without rupture, unspecified: Secondary | ICD-10-CM | POA: Diagnosis not present

## 2023-07-25 DIAGNOSIS — Z7901 Long term (current) use of anticoagulants: Secondary | ICD-10-CM | POA: Diagnosis not present

## 2023-07-25 DIAGNOSIS — L89310 Pressure ulcer of right buttock, unstageable: Secondary | ICD-10-CM | POA: Diagnosis not present

## 2023-07-25 DIAGNOSIS — I080 Rheumatic disorders of both mitral and aortic valves: Secondary | ICD-10-CM | POA: Diagnosis not present

## 2023-07-25 NOTE — Telephone Encounter (Signed)
Pt is going to be discharge from OT today doing really good. Pt still has 3 3 of PT.   If you have any questions you can call [phone]

## 2023-07-26 IMAGING — DX DG CHEST 2V
2 series · 2 of 2 positions shown · non-contrast
Comparison: 11/02/2019, 12/14/2020

CLINICAL DATA: Cough

EXAM:
CHEST - 2 VIEW

[chest pa]
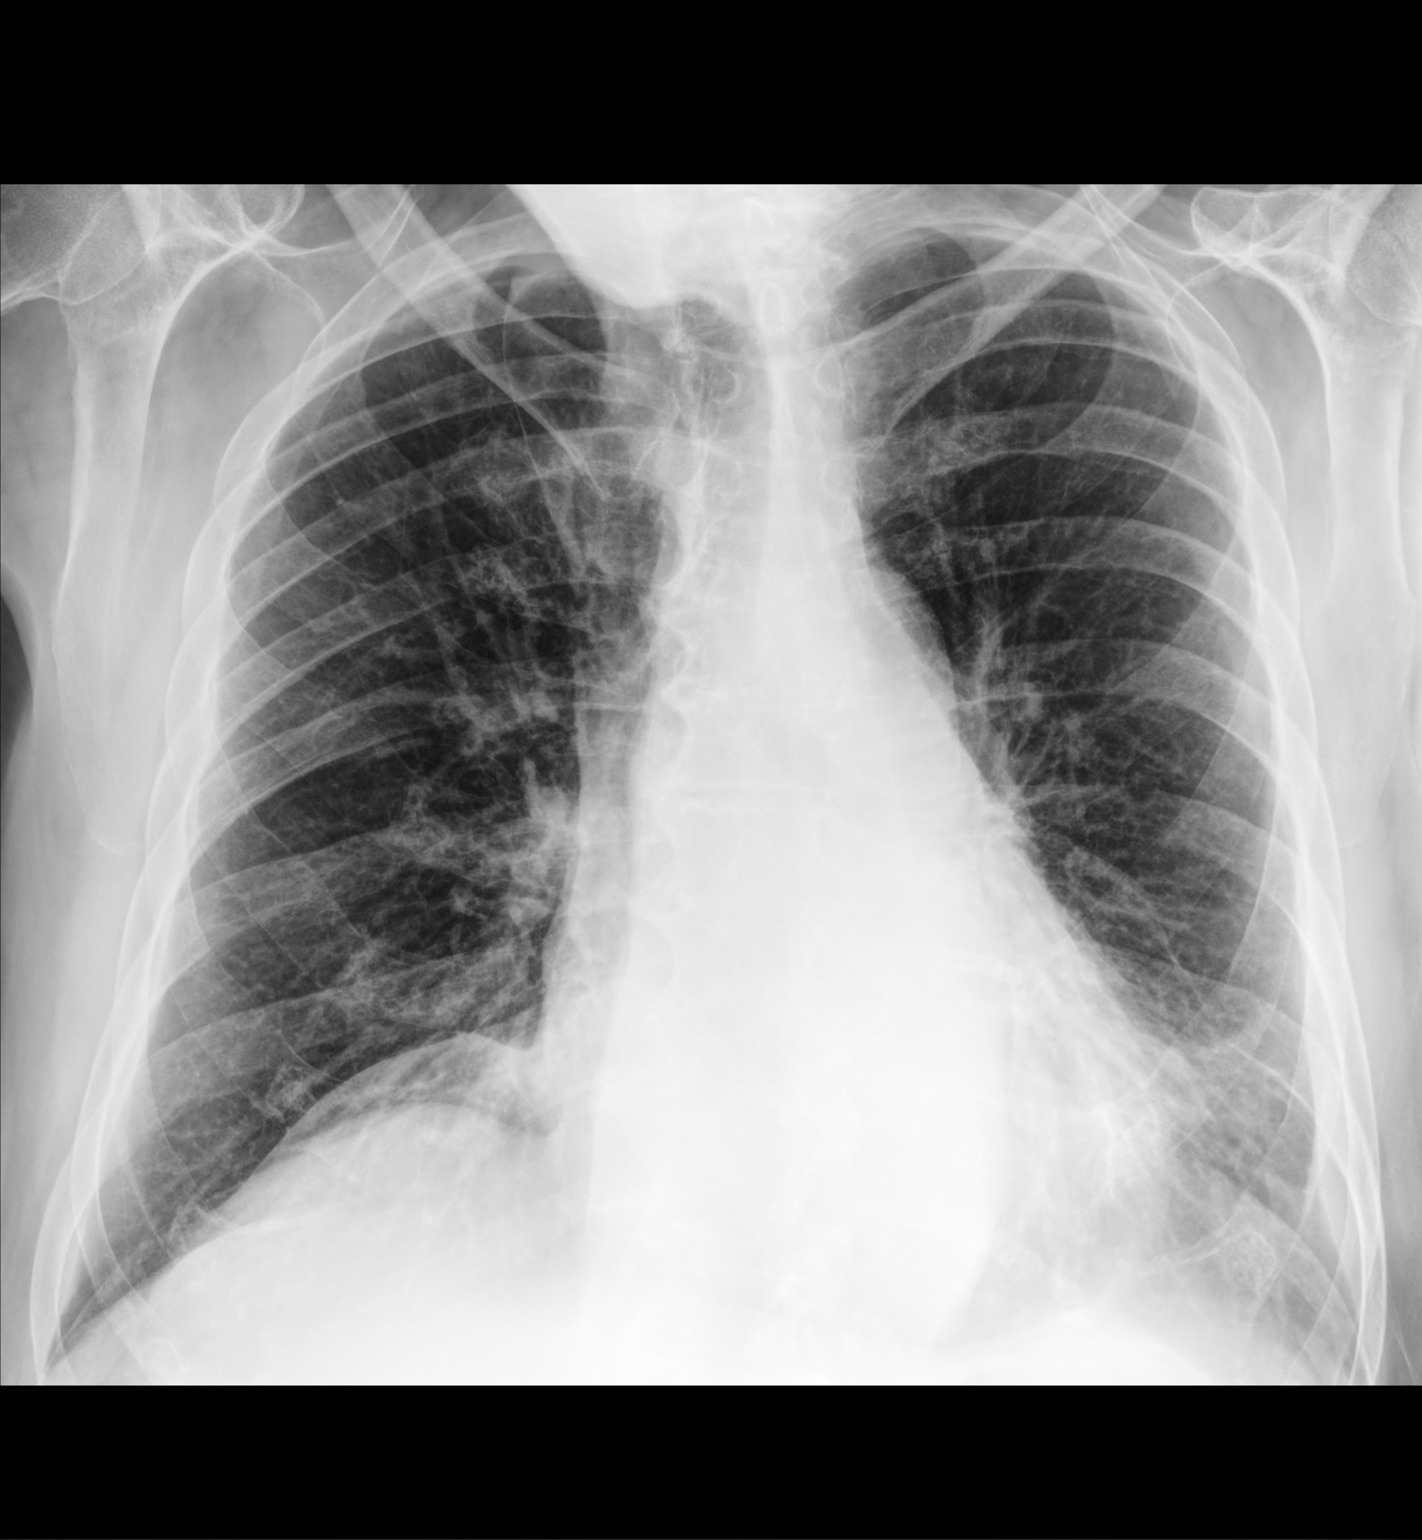

[chest lat]
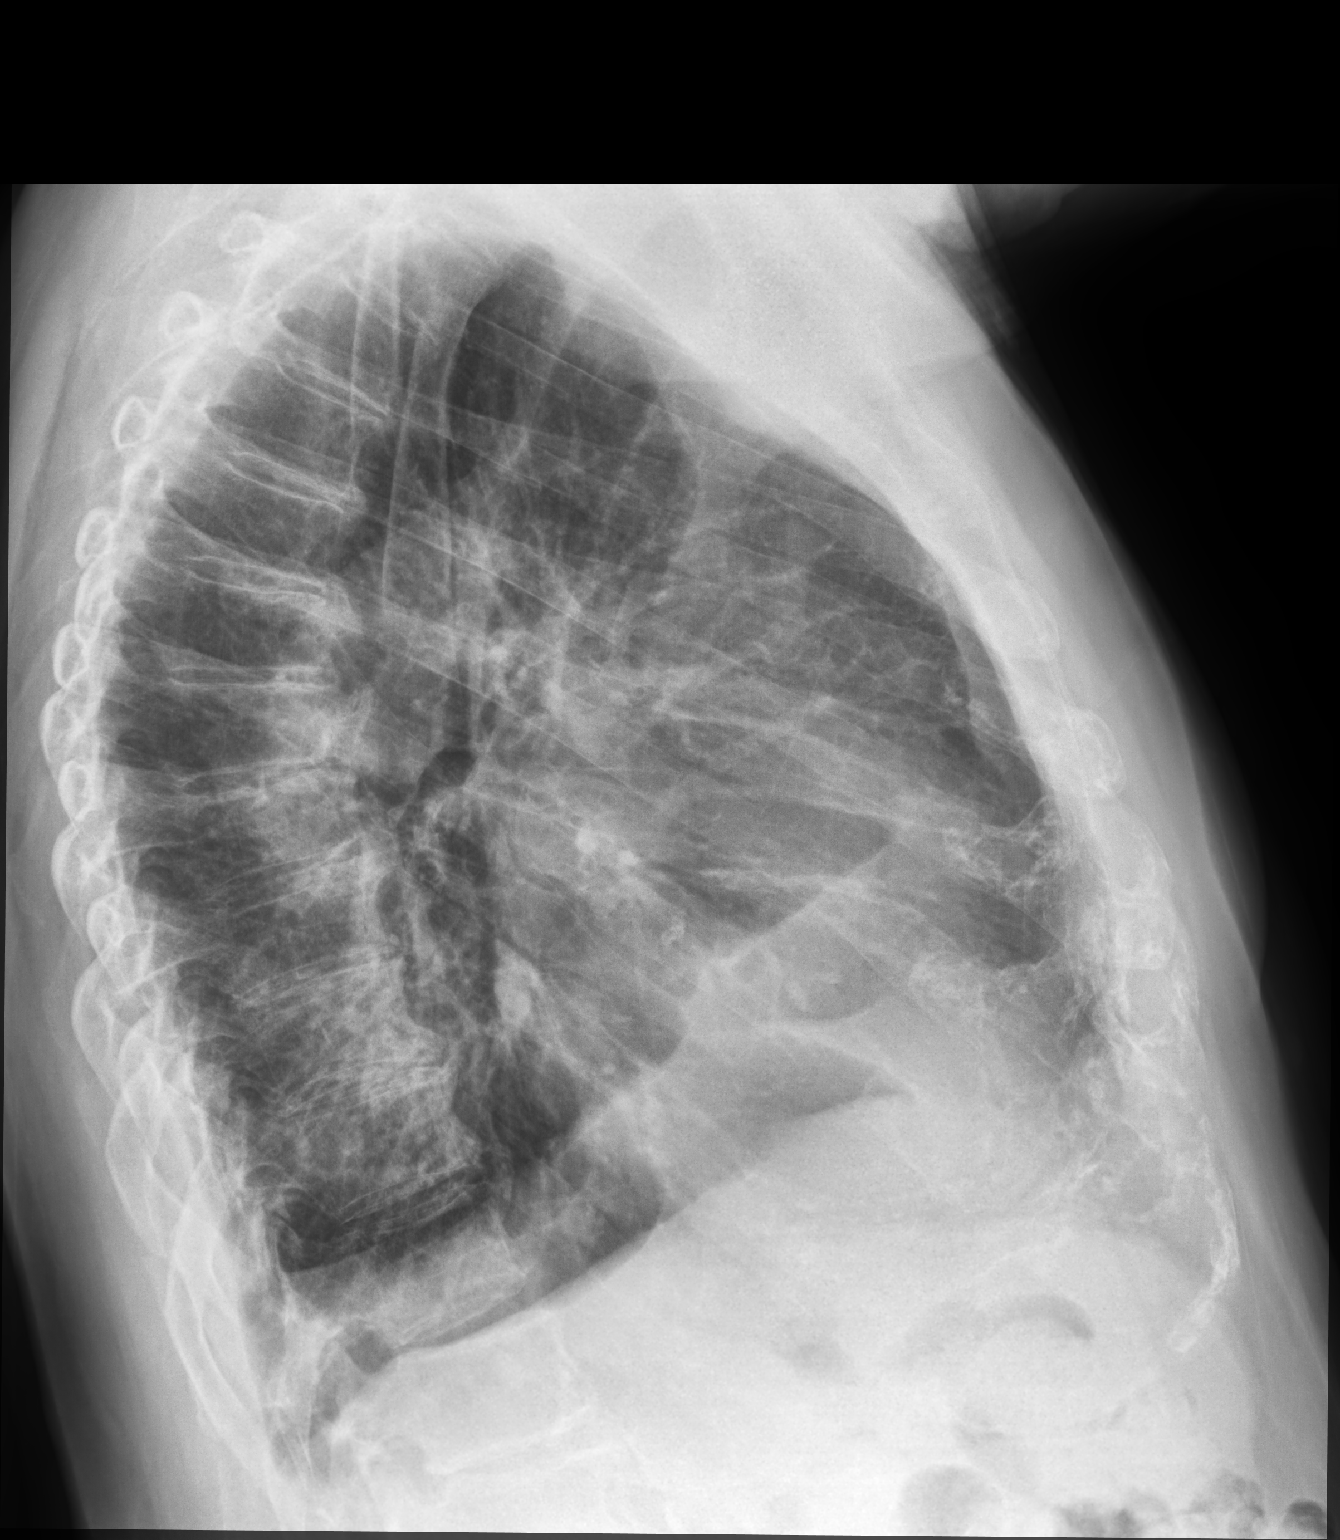

[2 of 2 positions shown; findings below may reference images not displayed]

FINDINGS: Stable cardiomediastinal contours. Streaky left basilar opacities,
favor scarring or atelectasis. Right lung is clear. No pleural
effusion or pneumothorax. Degenerative changes of the thoracic
spine.
IMPRESSION: Streaky left basilar opacities, favor scarring or atelectasis.

## 2023-07-31 DIAGNOSIS — I951 Orthostatic hypotension: Secondary | ICD-10-CM | POA: Diagnosis not present

## 2023-07-31 DIAGNOSIS — L89310 Pressure ulcer of right buttock, unstageable: Secondary | ICD-10-CM | POA: Diagnosis not present

## 2023-07-31 DIAGNOSIS — Z952 Presence of prosthetic heart valve: Secondary | ICD-10-CM | POA: Diagnosis not present

## 2023-07-31 DIAGNOSIS — Z9181 History of falling: Secondary | ICD-10-CM | POA: Diagnosis not present

## 2023-07-31 DIAGNOSIS — I080 Rheumatic disorders of both mitral and aortic valves: Secondary | ICD-10-CM | POA: Diagnosis not present

## 2023-07-31 DIAGNOSIS — I10 Essential (primary) hypertension: Secondary | ICD-10-CM | POA: Diagnosis not present

## 2023-07-31 DIAGNOSIS — Z79899 Other long term (current) drug therapy: Secondary | ICD-10-CM | POA: Diagnosis not present

## 2023-07-31 DIAGNOSIS — Z87440 Personal history of urinary (tract) infections: Secondary | ICD-10-CM | POA: Diagnosis not present

## 2023-07-31 DIAGNOSIS — Z556 Problems related to health literacy: Secondary | ICD-10-CM | POA: Diagnosis not present

## 2023-07-31 DIAGNOSIS — I712 Thoracic aortic aneurysm, without rupture, unspecified: Secondary | ICD-10-CM | POA: Diagnosis not present

## 2023-07-31 DIAGNOSIS — Z7951 Long term (current) use of inhaled steroids: Secondary | ICD-10-CM | POA: Diagnosis not present

## 2023-07-31 DIAGNOSIS — Z7901 Long term (current) use of anticoagulants: Secondary | ICD-10-CM | POA: Diagnosis not present

## 2023-07-31 DIAGNOSIS — I4821 Permanent atrial fibrillation: Secondary | ICD-10-CM | POA: Diagnosis not present

## 2023-08-08 DIAGNOSIS — Z87440 Personal history of urinary (tract) infections: Secondary | ICD-10-CM | POA: Diagnosis not present

## 2023-08-08 DIAGNOSIS — I951 Orthostatic hypotension: Secondary | ICD-10-CM | POA: Diagnosis not present

## 2023-08-08 DIAGNOSIS — L89310 Pressure ulcer of right buttock, unstageable: Secondary | ICD-10-CM | POA: Diagnosis not present

## 2023-08-08 DIAGNOSIS — I4821 Permanent atrial fibrillation: Secondary | ICD-10-CM | POA: Diagnosis not present

## 2023-08-08 DIAGNOSIS — Z556 Problems related to health literacy: Secondary | ICD-10-CM | POA: Diagnosis not present

## 2023-08-08 DIAGNOSIS — I10 Essential (primary) hypertension: Secondary | ICD-10-CM | POA: Diagnosis not present

## 2023-08-08 DIAGNOSIS — I712 Thoracic aortic aneurysm, without rupture, unspecified: Secondary | ICD-10-CM | POA: Diagnosis not present

## 2023-08-08 DIAGNOSIS — I080 Rheumatic disorders of both mitral and aortic valves: Secondary | ICD-10-CM | POA: Diagnosis not present

## 2023-08-08 DIAGNOSIS — Z952 Presence of prosthetic heart valve: Secondary | ICD-10-CM | POA: Diagnosis not present

## 2023-08-08 DIAGNOSIS — Z7951 Long term (current) use of inhaled steroids: Secondary | ICD-10-CM | POA: Diagnosis not present

## 2023-08-08 DIAGNOSIS — Z79899 Other long term (current) drug therapy: Secondary | ICD-10-CM | POA: Diagnosis not present

## 2023-08-08 DIAGNOSIS — Z7901 Long term (current) use of anticoagulants: Secondary | ICD-10-CM | POA: Diagnosis not present

## 2023-08-08 DIAGNOSIS — Z9181 History of falling: Secondary | ICD-10-CM | POA: Diagnosis not present

## 2023-08-09 ENCOUNTER — Other Ambulatory Visit: Payer: Self-pay | Admitting: Physician Assistant

## 2023-08-09 DIAGNOSIS — Z952 Presence of prosthetic heart valve: Secondary | ICD-10-CM

## 2023-08-11 ENCOUNTER — Other Ambulatory Visit: Payer: Self-pay | Admitting: Family Medicine

## 2023-08-11 DIAGNOSIS — J449 Chronic obstructive pulmonary disease, unspecified: Secondary | ICD-10-CM

## 2023-08-14 ENCOUNTER — Telehealth: Payer: Self-pay | Admitting: *Deleted

## 2023-08-14 DIAGNOSIS — I080 Rheumatic disorders of both mitral and aortic valves: Secondary | ICD-10-CM | POA: Diagnosis not present

## 2023-08-14 DIAGNOSIS — I712 Thoracic aortic aneurysm, without rupture, unspecified: Secondary | ICD-10-CM | POA: Diagnosis not present

## 2023-08-14 DIAGNOSIS — L89310 Pressure ulcer of right buttock, unstageable: Secondary | ICD-10-CM | POA: Diagnosis not present

## 2023-08-14 DIAGNOSIS — Z556 Problems related to health literacy: Secondary | ICD-10-CM | POA: Diagnosis not present

## 2023-08-14 DIAGNOSIS — Z79899 Other long term (current) drug therapy: Secondary | ICD-10-CM | POA: Diagnosis not present

## 2023-08-14 DIAGNOSIS — Z87440 Personal history of urinary (tract) infections: Secondary | ICD-10-CM | POA: Diagnosis not present

## 2023-08-14 DIAGNOSIS — Z7951 Long term (current) use of inhaled steroids: Secondary | ICD-10-CM | POA: Diagnosis not present

## 2023-08-14 DIAGNOSIS — I10 Essential (primary) hypertension: Secondary | ICD-10-CM | POA: Diagnosis not present

## 2023-08-14 DIAGNOSIS — I4821 Permanent atrial fibrillation: Secondary | ICD-10-CM | POA: Diagnosis not present

## 2023-08-14 DIAGNOSIS — Z9181 History of falling: Secondary | ICD-10-CM | POA: Diagnosis not present

## 2023-08-14 DIAGNOSIS — Z7901 Long term (current) use of anticoagulants: Secondary | ICD-10-CM | POA: Diagnosis not present

## 2023-08-14 DIAGNOSIS — I951 Orthostatic hypotension: Secondary | ICD-10-CM | POA: Diagnosis not present

## 2023-08-14 DIAGNOSIS — Z952 Presence of prosthetic heart valve: Secondary | ICD-10-CM | POA: Diagnosis not present

## 2023-08-14 NOTE — Telephone Encounter (Signed)
Arlesia Kiel from [facility] calling to request ongoing PT orders. She is wanting to do 1x a week for 9 weeks.  Verbal given and she will send the formal request for provider to sign.

## 2023-08-14 NOTE — Progress Notes (Signed)
[facility] - Clinic Note  08/21/2023     CHIEF COMPLAINT Patient presents for Retina Follow Up   HISTORY OF PRESENT ILLNESS: Warren Smisek. is a 88 y.o. male who presents to the clinic today for:   HPI     Retina Follow Up   Patient presents with  Wet AMD.  In both eyes.  This started 6 weeks ago.  Duration of 6 weeks.  Since onset it is stable.  I, the attending physician,  performed the HPI with the patient and updated documentation appropriately.        Comments   6 week retina follow up ARMD OD and I'VE OD pt is reporting no vision changes noticed he denies any flashes or floaters       Last edited by Rennis Chris, MD on 08/21/2023 12:58 PM.     Patient feels the vision is about the same.   Referring physician: Melida Quitter, PA [address] [place],  [place] [address]  HISTORICAL INFORMATION:   Selected notes from the MEDICAL RECORD NUMBER [name] transferring care due to insurance LEE: 03.27.23 [BCVA 20/40 OD, CF OS] Ocular Hx- ex ARMD OU; last injection was IVA OS on 05.24.21  PMH-    CURRENT MEDICATIONS: No current outpatient medications on file. (Ophthalmic Drugs)   No current facility-administered medications for this visit. (Ophthalmic Drugs)   Current Outpatient Medications (Other)  Medication Sig   acetaminophen (TYLENOL) 500 MG tablet Take 1,000 mg by mouth every 6 (six) hours as needed for moderate pain or headache.   amoxicillin (AMOXIL) 500 MG tablet Take 4 tablets (2,000 mg total) by mouth as directed. 1 hour prior to dental work including cleanings   cetirizine (ZYRTEC) 10 MG tablet Take 1 tablet by mouth once daily   docusate sodium (COLACE) 100 MG capsule Take 100 mg by mouth daily.   finasteride (PROSCAR) 5 MG tablet Take 1 tablet by mouth once daily   furosemide (LASIX) 40 MG tablet Take 1 tablet (40 mg total) by mouth daily. (Patient taking differently: Take 20-40 mg by mouth See admin  instructions. Taking 20 mg daily except on a day when weight gain is noticed can take 40 mg daily)   memantine (NAMENDA) 10 MG tablet Take 1 tablet by mouth twice daily   metoprolol succinate (TOPROL-XL) 50 MG 24 hr tablet Take 0.5 tablets (25 mg total) by mouth daily. Take with or immediately following a meal. May split current pills in half until bottle is gone.   Multiple Vitamins-Minerals (MULTIVITAMIN WITH MINERALS) tablet Take 1 tablet by mouth daily.   Multiple Vitamins-Minerals (OCUVITE PO) Take 1 tablet by mouth daily.   OMEGA-3 FATTY ACIDS PO Take 500 mg by mouth daily.   QUEtiapine (SEROQUEL) 25 MG tablet Take 0.5 tablets (12.5 mg total) by mouth 2 (two) times daily.   sodium chloride (OCEAN) 0.65 % SOLN nasal spray Place 1 spray into both nostrils daily as needed for congestion.   TRELEGY ELLIPTA 100-62.5-25 MCG/ACT AEPB Inhale 1 puff by mouth once daily   warfarin (COUMADIN) 5 MG tablet TAKE 1/2 TO 1 (ONE-HALF TO ONE) TABLET BY MOUTH ONCE DAILY AS DIRECTED BY  ANTICOAGULATION  CLINIC.   No current facility-administered medications for this visit. (Other)   REVIEW OF SYSTEMS: ROS   Positive for: Cardiovascular, Eyes Negative for: Constitutional, Gastrointestinal, Neurological, Skin, Genitourinary, Musculoskeletal, HENT, Endocrine, Respiratory, Psychiatric, Allergic/Imm, Heme/Lymph Last edited by Etheleen Mayhew, COT on  08/21/2023  9:23 AM.     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 Clifton James & Dr. Laneta Simmers   Severe aortic stenosis    Umbilical hernia 07/27/2012   Past Surgical History:  Procedure Laterality Date    CARDIOVERSION  10/10/2006   cataracts Bilateral    HERNIA REPAIR  09/10/2012   large umbilical hernia   INSERTION OF MESH  09/10/2012   Procedure: INSERTION OF MESH;  Surgeon: [name], MD;  Location: [facility];  Service: General;  Laterality: N/A;   INTRAOPERATIVE TRANSTHORACIC ECHOCARDIOGRAM N/A 10/18/2022   Procedure: INTRAOPERATIVE TRANSTHORACIC ECHOCARDIOGRAM;  Surgeon: Kathleene Hazel, MD;  Location: [facility] INVASIVE CV LAB;  Service: Open Heart Surgery;  Laterality: N/A;   macular degeneration Bilateral    RIGHT HEART CATH AND CORONARY ANGIOGRAPHY N/A 09/09/2022   Procedure: RIGHT HEART CATH AND CORONARY ANGIOGRAPHY;  Surgeon: Kathleene Hazel, MD;  Location: [facility] INVASIVE CV LAB;  Service: Cardiovascular;  Laterality: N/A;   TONSILLECTOMY     "maybe" (09/10/2012)   TRANSCATHETER AORTIC VALVE REPLACEMENT, TRANSFEMORAL N/A 10/18/2022   Procedure: Transcatheter Aortic Valve Replacement, Transfemoral;  Surgeon: Kathleene Hazel, MD;  Location: [facility] INVASIVE CV LAB;  Service: Open Heart Surgery;  Laterality: N/A;   UMBILICAL HERNIA REPAIR  09/10/2012   Procedure: HERNIA REPAIR UMBILICAL ADULT;  Surgeon: [name], MD;  Location: [facility] OR;  Service: General;  Laterality: N/A;   FAMILY HISTORY Family History  Problem Relation Age of Onset   Heart disease Mother    Stroke Father    SOCIAL HISTORY Social History   Tobacco Use   Smoking status: Former    Current packs/day: 0.00    Average packs/day: 1.5 packs/day for 10.0 years (15.0 ttl pk-yrs)    Types: Cigarettes    Start date: 07/27/1972    Quit date: 07/27/1982    Years since quitting: 41.0   Smokeless tobacco: Never  Vaping Use   Vaping status: Never Used  Substance Use Topics   Drug use: No       OPHTHALMIC EXAM:  Base Eye Exam     Visual Acuity (Snellen - Linear)       Right Left   Dist [place] 20/150 CF at 3'   Dist ph [place] 20/80 -1 NI         Tonometry (Tonopen, 9:29 AM)       Right Left    Pressure 14 15         Pupils       Pupils Dark Light Shape React APD   Right PERRL 2 1 Round Brisk None   Left PERRL 2 1 Round Brisk None         Visual Fields       Left Right    Full Full         Extraocular Movement       Right Left    Full, Ortho Full, Ortho         Neuro/Psych     Oriented x3: Yes   Mood/Affect: Normal         Dilation  Both eyes: 2.5% Phenylephrine @ 9:29 AM           Slit Lamp and Fundus Exam     External Exam       Right Left   External Normal Normal         Slit Lamp Exam       Right Left   Lids/Lashes Dermatochalasis - upper lid, Ptosis Dermatochalasis - upper lid, mild MGD   Conjunctiva/Sclera White and quiet White and quiet   Cornea 1-2+ Punctate epithelial erosions, mild arcus, well healed cataract wound, tear film debris Trace tear film debris, well healed cataract wound, arcus   Anterior Chamber deep and clear deep and clear   Iris Round and dilated round and poorly dilated   Lens PC IOL in good position, 1-2+ Posterior capsular opacification PC IOL in good position, 2-3+ Posterior capsular opacification   Anterior Vitreous mild syneresis Vitreous syneresis         Fundus Exam       Right Left   Posterior Vitreous Posterior vitreous detachment Posterior vitreous detachment, vitreous condensations   Disc 2+Pallor, Sharp rim, Compact 2+Pallor, Sharp rim   C/D Ratio 0.2 0.3   Macula Blunted foveal reflex, refractile Drusen, RPE mottling, clumping and atrophy, focal central edema- stably improved, no frank heme Flat, blunted foveal reflex, refractile Drusen, RPE mottling, clumping and atrophy, central GA, no frank heme   Vessels attenuated, Tortuous attenuated, mild tortuosity   Periphery Attached, reticular degeneration, no heme Attached, reticlar degeneration, no heme           Refraction     Wearing Rx       Sphere Cylinder Axis Add   Right -0.75 +0.75 087 +2.75   Left -0.75 +0.75 083 +2.75            IMAGING AND PROCEDURES  Imaging and Procedures for 08/21/2023  OCT, Retina - OU - Both Eyes       Right Eye Quality was good. Scan locations included subfoveal. Central Foveal Thickness: 218. Progression has improved. Findings include normal foveal contour, no IRF, retinal drusen , subretinal hyper-reflective material, pigment epithelial detachment, subretinal fluid, outer retinal atrophy (Patchy central ORA with PEDs- slightly improved, central pocket of SRF -- stably improved).   Left Eye Quality was poor. Scan locations included subfoveal. Central Foveal Thickness: 242. Progression has been stable. Findings include no IRF, no SRF, abnormal foveal contour, retinal drusen , outer retinal tubulation, pigment epithelial detachment, outer retinal atrophy (Central ORA / GA with PEDs and ORT).   Notes *Images captured and stored on drive  Diagnosis / Impression:  OD: Patchy central ORA with PEDs- slightly improved, central pocket of SRF -- stably improved OS: non exudative ARMD - Central ORA / GA with PEDs and ORT  Clinical management:  See below  Abbreviations: NFP - Normal foveal profile. CME - cystoid macular edema. PED - pigment epithelial detachment. IRF - intraretinal fluid. SRF - subretinal fluid. EZ - ellipsoid zone. ERM - epiretinal membrane. ORA - outer retinal atrophy. ORT - outer retinal tubulation. SRHM - subretinal hyper-reflective material. IRHM - intraretinal hyper-reflective material      Intravitreal Injection, Pharmacologic Agent - OD - Right Eye       Time Out 08/21/2023. 9:51 AM. Confirmed correct patient, procedure, site, and patient consented.   Anesthesia Topical anesthesia was used. Anesthetic medications included Lidocaine 2%, Proparacaine 0.5%.   Procedure Preparation included 5% betadine to ocular surface, eyelid speculum. A (32g) needle was used.  Injection: 2 mg aflibercept 2 MG/0.05ML   Route: Intravitreal, Site: Right Eye   NDC:  [id number], Lot: [id number], Expiration date: 10/09/2024, Waste: 0 mL   Post-op Post injection exam found visual acuity of at least counting fingers. The patient tolerated the procedure well. There were no complications. The patient received written and verbal post procedure care education.            ASSESSMENT/PLAN:    ICD-10-CM   1. Exudative age-related macular degeneration of right eye with active choroidal neovascularization (HCC)  H35.3211 OCT, Retina - OU - Both Eyes    Intravitreal Injection, Pharmacologic Agent - OD - Right Eye    aflibercept (EYLEA) SOLN 2 mg    2. 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 Essential hypertension  I10     4. Hypertensive retinopathy of both eyes  H35.033     5. Pseudophakia, both eyes  Z96.1      Exudative age related macular degeneration, OD  - previous Dr. Luciana Axe pt here due to insurance  - pt reports history of injections OD w/ [name], but unable to see record of that - here s/p IVA OD #1 (02.07.24), #2 (03.13.24), #3 (04.17.24), #4 (05.31.24) -- IVA resistance  - s/p IVE OD #1 (06.28.24), #2 (07.29.24), #3 (08.26.24), #4 (09.30.24)  - BCVA OD 20/80 from 20/150 - OCT OD: Patchy central ORA with PEDs- slightly improved, central pocket of SRF -- stably improved at 6 wks  - recommend IVE OD #5 today, 11.11.24 w/ f/u up extended to 7 wks - pt wishes to proceed with injection - RBA of procedure discussed, questions answered - IVE informed consent obtained and signed, 06.28.24 (OD) - see procedure note   - f/u in 7 wks -- DFE/OCT, possible injection  2. Age related macular degeneration, non-exudative, left eye  - advanced stage w/ significant central GA  - BCVA CF 3' -- stable   - history of IVA OS w/ [name], last 05.24.21  - OCT OS shows Central ORA / GA with PEDs and ORT  - Recommend amsler grid monitoring  - monitor  3,4. Hypertensive retinopathy  OU - discussed importance of tight BP control - monitor  5. Pseudophakia OU  - s/p CE/IOL (Dr. Nile Riggs)  - IOL in good position, doing well  - monitor  Ophthalmic Meds Ordered this visit:  Meds ordered this encounter  Medications   aflibercept (EYLEA) SOLN 2 mg     Return in about 7 weeks (around 10/09/2023) for f/u Ex. AMD OD , DFE, OCT, Possible, IVE, OD.  There are no Patient Instructions on file for this visit.   This document serves as a record of services personally performed by Karie Chimera, MD, PhD. It was created on their behalf by Glee Arvin. Manson Passey, OA an ophthalmic technician. The creation of this record is the provider's dictation and/or activities during the visit.    Electronically signed by: Glee Arvin. Manson Passey, OA 08/21/23 1:01 PM  This document serves as a record of services personally performed by Karie Chimera, MD, PhD. It was created on their behalf by Charlette Caffey, COT an ophthalmic technician. The creation of this record is the provider's dictation and/or activities during the visit.    Electronically signed by:  Charlette Caffey, COT  08/21/23 1:01 PM  Karie Chimera, M.D., Ph.D. Diseases & Surgery of the Retina and Vitreous [facility]  I have reviewed the above documentation for accuracy and completeness, and I agree with the above. Karie Chimera, M.D., Ph.D. 08/21/23 1:01 PM  Abbreviations: M myopia (nearsighted); A astigmatism; H hyperopia (farsighted); P presbyopia; Mrx spectacle prescription;  CTL contact lenses; OD right eye; OS left eye; OU both eyes  XT exotropia; ET esotropia; PEK punctate epithelial keratitis; PEE punctate epithelial erosions; DES dry eye syndrome; MGD meibomian gland dysfunction; ATs artificial tears; PFAT's preservative free artificial tears; NSC nuclear sclerotic cataract; PSC posterior subcapsular cataract; ERM epi-retinal membrane; PVD posterior vitreous detachment; RD retinal detachment; DM  diabetes mellitus; DR diabetic retinopathy; NPDR non-proliferative diabetic retinopathy; PDR proliferative diabetic retinopathy; CSME clinically significant macular edema; DME diabetic macular edema; dbh dot blot hemorrhages; CWS cotton wool spot; POAG primary open angle glaucoma; C/D cup-to-disc ratio; HVF humphrey visual field; GVF goldmann visual field; OCT optical coherence tomography; IOP intraocular pressure; BRVO Branch retinal vein occlusion; CRVO central retinal vein occlusion; CRAO central retinal artery occlusion; BRAO branch retinal artery occlusion; RT retinal tear; SB scleral buckle; PPV pars plana vitrectomy; VH Vitreous hemorrhage; PRP panretinal laser photocoagulation; IVK intravitreal kenalog; VMT vitreomacular traction; MH Macular hole;  NVD neovascularization of the disc; NVE neovascularization elsewhere; AREDS age related eye disease study; ARMD age related macular degeneration; POAG primary open angle glaucoma; EBMD epithelial/anterior basement membrane dystrophy; ACIOL anterior chamber intraocular lens; IOL intraocular lens; PCIOL posterior chamber intraocular lens; Phaco/IOL phacoemulsification with intraocular lens placement; PRK photorefractive keratectomy; LASIK laser assisted in situ keratomileusis; HTN hypertension; DM diabetes mellitus; COPD chronic obstructive pulmonary disease

## 2023-08-16 ENCOUNTER — Other Ambulatory Visit: Payer: Self-pay | Admitting: Family Medicine

## 2023-08-21 ENCOUNTER — Ambulatory Visit (INDEPENDENT_AMBULATORY_CARE_PROVIDER_SITE_OTHER): Payer: Medicare Other | Admitting: Ophthalmology

## 2023-08-21 ENCOUNTER — Encounter (INDEPENDENT_AMBULATORY_CARE_PROVIDER_SITE_OTHER): Payer: Self-pay | Admitting: Ophthalmology

## 2023-08-21 DIAGNOSIS — Z961 Presence of intraocular lens: Secondary | ICD-10-CM

## 2023-08-21 DIAGNOSIS — H35033 Hypertensive retinopathy, bilateral: Secondary | ICD-10-CM

## 2023-08-21 DIAGNOSIS — H353211 Exudative age-related macular degeneration, right eye, with active choroidal neovascularization: Secondary | ICD-10-CM | POA: Diagnosis not present

## 2023-08-21 DIAGNOSIS — I10 Essential (primary) hypertension: Secondary | ICD-10-CM | POA: Diagnosis not present

## 2023-08-21 DIAGNOSIS — H353124 Nonexudative age-related macular degeneration, left eye, advanced atrophic with subfoveal involvement: Secondary | ICD-10-CM

## 2023-08-21 MED ORDER — AFLIBERCEPT 2MG/0.05ML IZ SOLN FOR KALEIDOSCOPE
2.0000 mg | INTRAVITREAL | Status: AC | PRN
Start: 2023-08-21 — End: 2023-08-21
  Administered 2023-08-21: 2 mg via INTRAVITREAL

## 2023-08-22 ENCOUNTER — Ambulatory Visit: Payer: Medicare Other | Attending: Cardiology

## 2023-08-22 DIAGNOSIS — I4821 Permanent atrial fibrillation: Secondary | ICD-10-CM

## 2023-08-22 DIAGNOSIS — Z7901 Long term (current) use of anticoagulants: Secondary | ICD-10-CM

## 2023-08-22 DIAGNOSIS — I4891 Unspecified atrial fibrillation: Secondary | ICD-10-CM

## 2023-08-22 DIAGNOSIS — Z5181 Encounter for therapeutic drug level monitoring: Secondary | ICD-10-CM

## 2023-08-22 LAB — POCT INR: INR: 2.4 (ref 2.0–3.0)

## 2023-08-22 NOTE — Patient Instructions (Signed)
Description   Continue taking 1 tablet daily except 0.5 tablet on Sunday, Monday, Wednesday and Friday.  Stay consistent with greens (2 times per week)  Recheck INR 6 weeks  Coumadin Clinic [phone]

## 2023-08-24 ENCOUNTER — Other Ambulatory Visit: Payer: Self-pay | Admitting: Cardiology

## 2023-08-24 DIAGNOSIS — Z7901 Long term (current) use of anticoagulants: Secondary | ICD-10-CM | POA: Diagnosis not present

## 2023-08-24 DIAGNOSIS — Z87891 Personal history of nicotine dependence: Secondary | ICD-10-CM | POA: Diagnosis not present

## 2023-08-24 DIAGNOSIS — I712 Thoracic aortic aneurysm, without rupture, unspecified: Secondary | ICD-10-CM | POA: Diagnosis not present

## 2023-08-24 DIAGNOSIS — I4821 Permanent atrial fibrillation: Secondary | ICD-10-CM | POA: Diagnosis not present

## 2023-08-24 DIAGNOSIS — Z952 Presence of prosthetic heart valve: Secondary | ICD-10-CM | POA: Diagnosis not present

## 2023-08-24 DIAGNOSIS — Z9181 History of falling: Secondary | ICD-10-CM | POA: Diagnosis not present

## 2023-08-24 DIAGNOSIS — Z7951 Long term (current) use of inhaled steroids: Secondary | ICD-10-CM | POA: Diagnosis not present

## 2023-08-24 DIAGNOSIS — N400 Enlarged prostate without lower urinary tract symptoms: Secondary | ICD-10-CM | POA: Diagnosis not present

## 2023-08-24 DIAGNOSIS — I4891 Unspecified atrial fibrillation: Secondary | ICD-10-CM

## 2023-08-24 DIAGNOSIS — I080 Rheumatic disorders of both mitral and aortic valves: Secondary | ICD-10-CM | POA: Diagnosis not present

## 2023-08-24 DIAGNOSIS — I10 Essential (primary) hypertension: Secondary | ICD-10-CM | POA: Diagnosis not present

## 2023-08-24 DIAGNOSIS — I951 Orthostatic hypotension: Secondary | ICD-10-CM | POA: Diagnosis not present

## 2023-08-24 DIAGNOSIS — Z85828 Personal history of other malignant neoplasm of skin: Secondary | ICD-10-CM | POA: Diagnosis not present

## 2023-08-24 DIAGNOSIS — Z87440 Personal history of urinary (tract) infections: Secondary | ICD-10-CM | POA: Diagnosis not present

## 2023-08-24 DIAGNOSIS — Z556 Problems related to health literacy: Secondary | ICD-10-CM | POA: Diagnosis not present

## 2023-08-24 NOTE — Telephone Encounter (Signed)
Warfarin 5mg  refill  Afib Last INR  08/22/23 Last OV 06/16/23

## 2023-08-28 DIAGNOSIS — I4821 Permanent atrial fibrillation: Secondary | ICD-10-CM | POA: Diagnosis not present

## 2023-08-28 DIAGNOSIS — I10 Essential (primary) hypertension: Secondary | ICD-10-CM | POA: Diagnosis not present

## 2023-08-28 DIAGNOSIS — Z87891 Personal history of nicotine dependence: Secondary | ICD-10-CM | POA: Diagnosis not present

## 2023-08-28 DIAGNOSIS — I712 Thoracic aortic aneurysm, without rupture, unspecified: Secondary | ICD-10-CM | POA: Diagnosis not present

## 2023-08-28 DIAGNOSIS — Z556 Problems related to health literacy: Secondary | ICD-10-CM | POA: Diagnosis not present

## 2023-08-28 DIAGNOSIS — Z7901 Long term (current) use of anticoagulants: Secondary | ICD-10-CM | POA: Diagnosis not present

## 2023-08-28 DIAGNOSIS — Z87440 Personal history of urinary (tract) infections: Secondary | ICD-10-CM | POA: Diagnosis not present

## 2023-08-28 DIAGNOSIS — Z952 Presence of prosthetic heart valve: Secondary | ICD-10-CM | POA: Diagnosis not present

## 2023-08-28 DIAGNOSIS — Z7951 Long term (current) use of inhaled steroids: Secondary | ICD-10-CM | POA: Diagnosis not present

## 2023-08-28 DIAGNOSIS — Z9181 History of falling: Secondary | ICD-10-CM | POA: Diagnosis not present

## 2023-08-28 DIAGNOSIS — I951 Orthostatic hypotension: Secondary | ICD-10-CM | POA: Diagnosis not present

## 2023-08-28 DIAGNOSIS — N400 Enlarged prostate without lower urinary tract symptoms: Secondary | ICD-10-CM | POA: Diagnosis not present

## 2023-08-28 DIAGNOSIS — Z85828 Personal history of other malignant neoplasm of skin: Secondary | ICD-10-CM | POA: Diagnosis not present

## 2023-08-28 DIAGNOSIS — I080 Rheumatic disorders of both mitral and aortic valves: Secondary | ICD-10-CM | POA: Diagnosis not present

## 2023-09-05 DIAGNOSIS — D492 Neoplasm of unspecified behavior of bone, soft tissue, and skin: Secondary | ICD-10-CM | POA: Diagnosis not present

## 2023-09-05 DIAGNOSIS — C44329 Squamous cell carcinoma of skin of other parts of face: Secondary | ICD-10-CM | POA: Diagnosis not present

## 2023-09-05 DIAGNOSIS — C44311 Basal cell carcinoma of skin of nose: Secondary | ICD-10-CM | POA: Diagnosis not present

## 2023-09-05 DIAGNOSIS — L570 Actinic keratosis: Secondary | ICD-10-CM | POA: Diagnosis not present

## 2023-09-05 DIAGNOSIS — L821 Other seborrheic keratosis: Secondary | ICD-10-CM | POA: Diagnosis not present

## 2023-09-06 DIAGNOSIS — Z87891 Personal history of nicotine dependence: Secondary | ICD-10-CM | POA: Diagnosis not present

## 2023-09-06 DIAGNOSIS — Z85828 Personal history of other malignant neoplasm of skin: Secondary | ICD-10-CM | POA: Diagnosis not present

## 2023-09-06 DIAGNOSIS — Z7951 Long term (current) use of inhaled steroids: Secondary | ICD-10-CM | POA: Diagnosis not present

## 2023-09-06 DIAGNOSIS — Z556 Problems related to health literacy: Secondary | ICD-10-CM | POA: Diagnosis not present

## 2023-09-06 DIAGNOSIS — I10 Essential (primary) hypertension: Secondary | ICD-10-CM | POA: Diagnosis not present

## 2023-09-06 DIAGNOSIS — Z952 Presence of prosthetic heart valve: Secondary | ICD-10-CM | POA: Diagnosis not present

## 2023-09-06 DIAGNOSIS — I4821 Permanent atrial fibrillation: Secondary | ICD-10-CM | POA: Diagnosis not present

## 2023-09-06 DIAGNOSIS — I951 Orthostatic hypotension: Secondary | ICD-10-CM | POA: Diagnosis not present

## 2023-09-06 DIAGNOSIS — I712 Thoracic aortic aneurysm, without rupture, unspecified: Secondary | ICD-10-CM | POA: Diagnosis not present

## 2023-09-06 DIAGNOSIS — Z87440 Personal history of urinary (tract) infections: Secondary | ICD-10-CM | POA: Diagnosis not present

## 2023-09-06 DIAGNOSIS — Z7901 Long term (current) use of anticoagulants: Secondary | ICD-10-CM | POA: Diagnosis not present

## 2023-09-06 DIAGNOSIS — I080 Rheumatic disorders of both mitral and aortic valves: Secondary | ICD-10-CM | POA: Diagnosis not present

## 2023-09-06 DIAGNOSIS — N400 Enlarged prostate without lower urinary tract symptoms: Secondary | ICD-10-CM | POA: Diagnosis not present

## 2023-09-06 DIAGNOSIS — Z9181 History of falling: Secondary | ICD-10-CM | POA: Diagnosis not present

## 2023-09-10 IMAGING — DX DG CHEST 1V PORT
1 series · 1 of 1 positions shown · non-contrast
Comparison: 12/18/2021 and CT chest 12/14/2020.

CLINICAL DATA: Diaphoresis, confusion, weakness.

EXAM:
PORTABLE CHEST 1 VIEW

[chest ap]
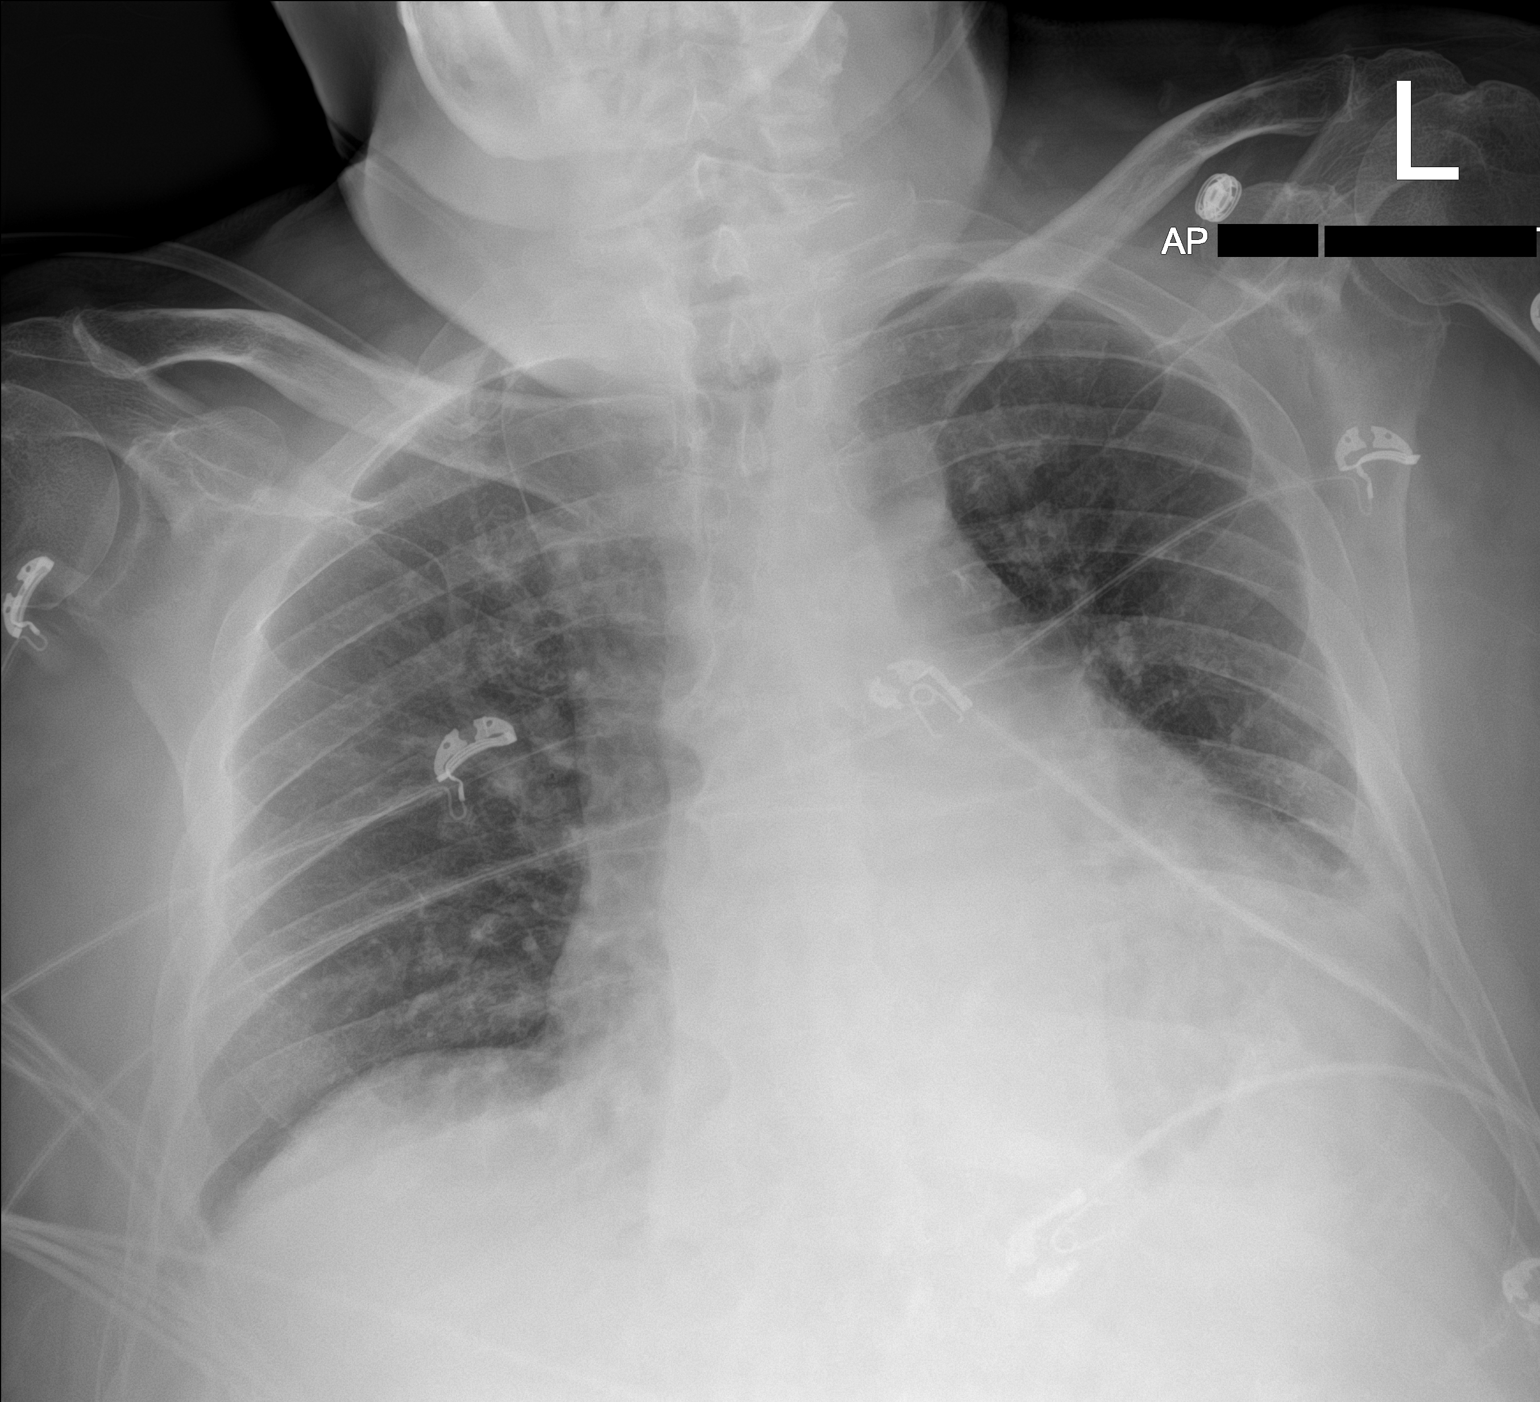

[1 of 1 positions shown; findings below may reference images not displayed]

FINDINGS: Trachea is midline. Heart is enlarged. Lungs are somewhat low in
volume with left basilar subsegmental atelectasis or scarring. There
may be small bilateral pleural effusions.
IMPRESSION: 1. Basilar subsegmental atelectasis or scarring.
2. Possible small bilateral pleural effusions.

## 2023-09-11 DIAGNOSIS — Z87440 Personal history of urinary (tract) infections: Secondary | ICD-10-CM | POA: Diagnosis not present

## 2023-09-11 DIAGNOSIS — Z85828 Personal history of other malignant neoplasm of skin: Secondary | ICD-10-CM | POA: Diagnosis not present

## 2023-09-11 DIAGNOSIS — N400 Enlarged prostate without lower urinary tract symptoms: Secondary | ICD-10-CM | POA: Diagnosis not present

## 2023-09-11 DIAGNOSIS — Z556 Problems related to health literacy: Secondary | ICD-10-CM | POA: Diagnosis not present

## 2023-09-11 DIAGNOSIS — Z952 Presence of prosthetic heart valve: Secondary | ICD-10-CM | POA: Diagnosis not present

## 2023-09-11 DIAGNOSIS — Z9181 History of falling: Secondary | ICD-10-CM | POA: Diagnosis not present

## 2023-09-11 DIAGNOSIS — I951 Orthostatic hypotension: Secondary | ICD-10-CM | POA: Diagnosis not present

## 2023-09-11 DIAGNOSIS — Z7951 Long term (current) use of inhaled steroids: Secondary | ICD-10-CM | POA: Diagnosis not present

## 2023-09-11 DIAGNOSIS — I10 Essential (primary) hypertension: Secondary | ICD-10-CM | POA: Diagnosis not present

## 2023-09-11 DIAGNOSIS — I080 Rheumatic disorders of both mitral and aortic valves: Secondary | ICD-10-CM | POA: Diagnosis not present

## 2023-09-11 DIAGNOSIS — Z7901 Long term (current) use of anticoagulants: Secondary | ICD-10-CM | POA: Diagnosis not present

## 2023-09-11 DIAGNOSIS — I4821 Permanent atrial fibrillation: Secondary | ICD-10-CM | POA: Diagnosis not present

## 2023-09-11 DIAGNOSIS — Z87891 Personal history of nicotine dependence: Secondary | ICD-10-CM | POA: Diagnosis not present

## 2023-09-11 DIAGNOSIS — I712 Thoracic aortic aneurysm, without rupture, unspecified: Secondary | ICD-10-CM | POA: Diagnosis not present

## 2023-09-14 ENCOUNTER — Other Ambulatory Visit: Payer: Self-pay | Admitting: Family Medicine

## 2023-09-14 DIAGNOSIS — J449 Chronic obstructive pulmonary disease, unspecified: Secondary | ICD-10-CM

## 2023-09-18 DIAGNOSIS — I951 Orthostatic hypotension: Secondary | ICD-10-CM | POA: Diagnosis not present

## 2023-09-18 DIAGNOSIS — Z85828 Personal history of other malignant neoplasm of skin: Secondary | ICD-10-CM | POA: Diagnosis not present

## 2023-09-18 DIAGNOSIS — Z7951 Long term (current) use of inhaled steroids: Secondary | ICD-10-CM | POA: Diagnosis not present

## 2023-09-18 DIAGNOSIS — Z9181 History of falling: Secondary | ICD-10-CM | POA: Diagnosis not present

## 2023-09-18 DIAGNOSIS — Z952 Presence of prosthetic heart valve: Secondary | ICD-10-CM | POA: Diagnosis not present

## 2023-09-18 DIAGNOSIS — Z87891 Personal history of nicotine dependence: Secondary | ICD-10-CM | POA: Diagnosis not present

## 2023-09-18 DIAGNOSIS — Z7901 Long term (current) use of anticoagulants: Secondary | ICD-10-CM | POA: Diagnosis not present

## 2023-09-18 DIAGNOSIS — Z556 Problems related to health literacy: Secondary | ICD-10-CM | POA: Diagnosis not present

## 2023-09-18 DIAGNOSIS — I712 Thoracic aortic aneurysm, without rupture, unspecified: Secondary | ICD-10-CM | POA: Diagnosis not present

## 2023-09-18 DIAGNOSIS — Z87440 Personal history of urinary (tract) infections: Secondary | ICD-10-CM | POA: Diagnosis not present

## 2023-09-18 DIAGNOSIS — I4821 Permanent atrial fibrillation: Secondary | ICD-10-CM | POA: Diagnosis not present

## 2023-09-18 DIAGNOSIS — N400 Enlarged prostate without lower urinary tract symptoms: Secondary | ICD-10-CM | POA: Diagnosis not present

## 2023-09-18 DIAGNOSIS — I10 Essential (primary) hypertension: Secondary | ICD-10-CM | POA: Diagnosis not present

## 2023-09-18 DIAGNOSIS — I080 Rheumatic disorders of both mitral and aortic valves: Secondary | ICD-10-CM | POA: Diagnosis not present

## 2023-09-22 ENCOUNTER — Ambulatory Visit (HOSPITAL_COMMUNITY): Payer: Medicare Other | Attending: Cardiology

## 2023-09-22 DIAGNOSIS — Z952 Presence of prosthetic heart valve: Secondary | ICD-10-CM | POA: Insufficient documentation

## 2023-09-22 LAB — ECHOCARDIOGRAM COMPLETE
AV Mean grad: 5 mm[Hg]
AV Peak grad: 8.9 mm[Hg]
Ao pk vel: 1.49 m/s
MV M vel: 5.24 m/s
MV Peak grad: 109.7 mm[Hg]
S' Lateral: 3 cm

## 2023-09-24 NOTE — Progress Notes (Unsigned)
Cardiology Office Note:   Date:  09/26/2023  ID:  Warren Knock., DOB [birth year], MRN [id number] PCP: Melida Quitter, PA  [facility] Providers Cardiologist:  [name], MD {  History of Present Illness:   Warren Vonbergen. is a 88 y.o. male who presents for  88 y.o. male who presents for evaluation of lower extremity swelling.  He has atrial fib and has been on warfarin.  LV function is OK.   There were no significant valvular abnormalities.  He has aortic root enlargement.  He was in the hospital in April 2023 with dizziness and orthostatic hypotension.    He had rhabdo and dehydration.  He had a UTI.  He was found to have severe AS and TAVR.   He has severe MR.   It was decided to manage him medically because of his multiple comorbid issues.     They have palliative care come to the house.  He is actually done pretty well.  He just had an echocardiogram that shows stable TAVR.  His ejection fraction is well-preserved.  He has severe functional MR.  He has aortic root dilatation and severe left atrial enlargement.  He gets around for the most part in a wheelchair when he goes long distances but walks through the house to get himself up.  The patient denies any new symptoms such as chest discomfort, neck or arm discomfort. There has been no new shortness of breath, PND or orthopnea. There have been no reported palpitations, presyncope or syncope.   ROS: As stated in the HPI and negative for all other systems.  Studies Reviewed:    EKG:   EKG Interpretation Date/Time:  Tuesday September 26 2023 15:56:55 EST Ventricular Rate:  60 PR Interval:    QRS Duration:  112 QT Interval:  440 QTC Calculation: 440 R Axis:   58  Text Interpretation: Atrial fibrillation Poor anterior R wave progression When compared with ECG of 26-Sep-2023 15:56, No significant change was found Confirmed by [name] ([id number]) on 09/26/2023 4:31:21 PM    Risk Assessment/Calculations:     CHA2DS2-VASc Score = 3   This indicates a 3.2% annual risk of stroke. The patient's score is based upon: CHF History: 0 HTN History: 1 Diabetes History: 0 Stroke History: 0 Vascular Disease History: 0 Age Score: 2 Gender Score: 0   Physical Exam:   VS:  BP 126/62 (BP Location: Left Arm, Patient Position: Sitting, Cuff Size: Normal)   Pulse (!) 58   Ht 6' (1.829 m)   Wt 220 lb (99.8 kg)   BMI 29.84 kg/m    Wt Readings from Last 3 Encounters:  09/26/23 220 lb (99.8 kg)  06/16/23 205 lb 9.6 oz (93.3 kg)  06/13/23 222 lb (100.7 kg)     GEN: Well nourished, well developed in no acute distress NECK: No JVD; No carotid bruits CARDIAC: Irregular RR, 3 out of 6 holosystolic murmur heard at the apex and radiating slightly to the axilla, no diastolic murmurs, rubs, gallops RESPIRATORY:  Clear to auscultation without rales, wheezing or rhonchi  ABDOMEN: Soft, non-tender, non-distended EXTREMITIES: Mild bilateral leg edema; No deformity   ASSESSMENT AND PLAN:   Severe AS s/p TAVR: This is stable and is up-to-date with follow-up.  No change in therapy.  Severe mitral regurgitation:   We are going to manage this conservatively and he seems to be euvolemic.  No change in therapy.  Permanent afib:   He is in permanent atrial  fibrillation.  He tolerates anticoagulation.  Is up-to-date with blood work.  He has good rate control.  No change in therapy.   HTN: BP is at target.  No change in therapy.    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 No change in therapy.       Follow up with me in one year.  See APP in six months.   Signed, [name], MD

## 2023-09-26 ENCOUNTER — Encounter: Payer: Self-pay | Admitting: Cardiology

## 2023-09-26 ENCOUNTER — Ambulatory Visit: Payer: Medicare Other

## 2023-09-26 ENCOUNTER — Ambulatory Visit: Payer: Medicare Other | Attending: Cardiology | Admitting: Cardiology

## 2023-09-26 VITALS — BP 126/62 | HR 58 | Ht 72.0 in | Wt 220.0 lb

## 2023-09-26 DIAGNOSIS — E785 Hyperlipidemia, unspecified: Secondary | ICD-10-CM

## 2023-09-26 DIAGNOSIS — I4891 Unspecified atrial fibrillation: Secondary | ICD-10-CM | POA: Diagnosis not present

## 2023-09-26 DIAGNOSIS — I340 Nonrheumatic mitral (valve) insufficiency: Secondary | ICD-10-CM

## 2023-09-26 DIAGNOSIS — Z7901 Long term (current) use of anticoagulants: Secondary | ICD-10-CM

## 2023-09-26 DIAGNOSIS — Z5181 Encounter for therapeutic drug level monitoring: Secondary | ICD-10-CM

## 2023-09-26 DIAGNOSIS — I350 Nonrheumatic aortic (valve) stenosis: Secondary | ICD-10-CM | POA: Diagnosis not present

## 2023-09-26 DIAGNOSIS — I4821 Permanent atrial fibrillation: Secondary | ICD-10-CM

## 2023-09-26 DIAGNOSIS — I10 Essential (primary) hypertension: Secondary | ICD-10-CM

## 2023-09-26 LAB — POCT INR: INR: 2.4 (ref 2.0–3.0)

## 2023-09-26 IMAGING — CT CT CHEST W/O CM
1 series · 14 of 34 positions shown, 18 images · non-contrast
Comparison: CT scan of the chest 12/14/2020

CLINICAL DATA: COPD, surveillance imaging



[Series 2: chest w/o 2mm st · axial · non-contrast · 0.76mm/px · z∈[-307,-37]mm · 14 of 159 slices shown, 18 images]
[im 12/159  mediastinal]
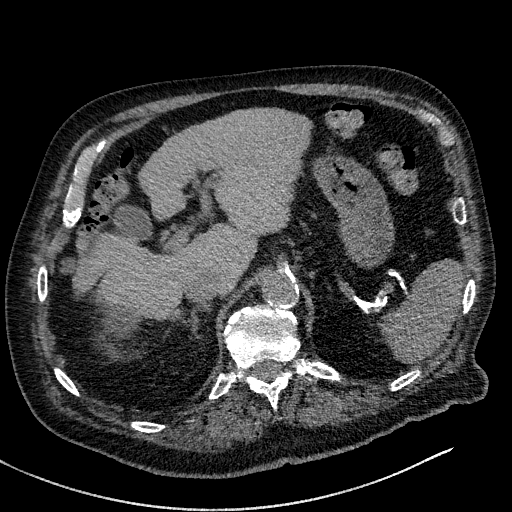
[im 12/159  lung]
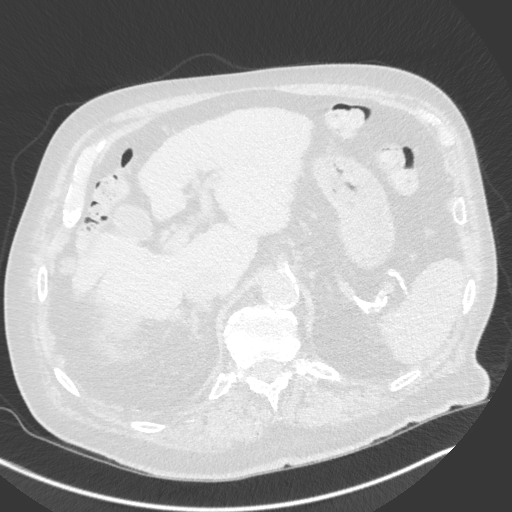
[im 24/159  lung]
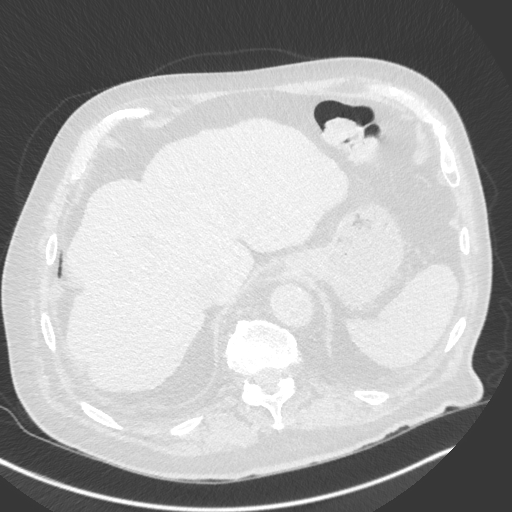
[im 32/159  lung]
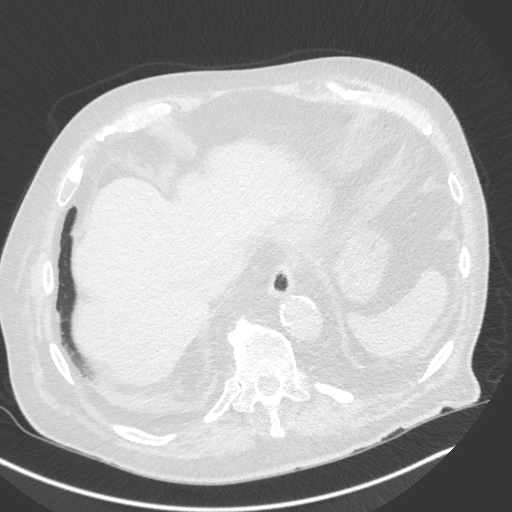
[im 47/159  lung]
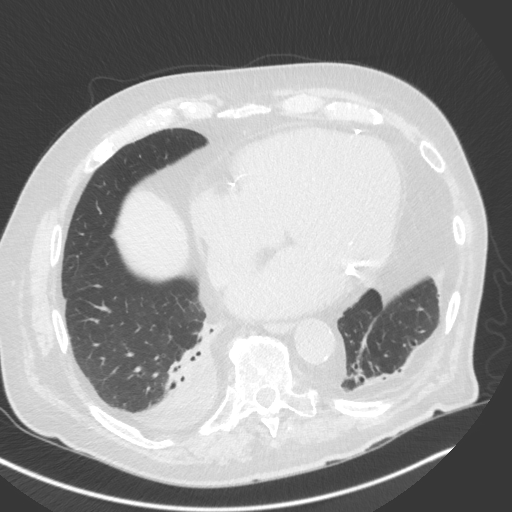
[im 59/159  mediastinal]
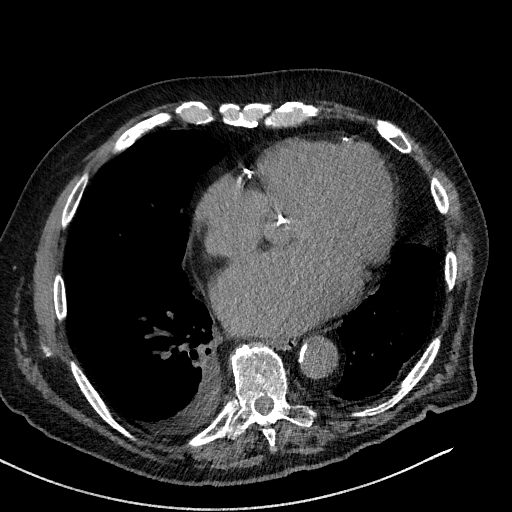
[im 59/159  lung]
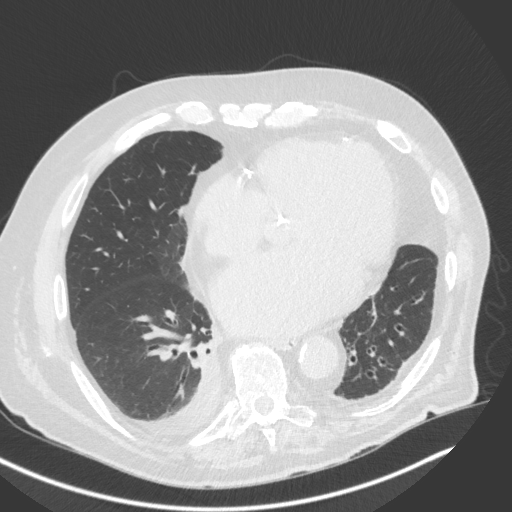
[im 65/159  lung]
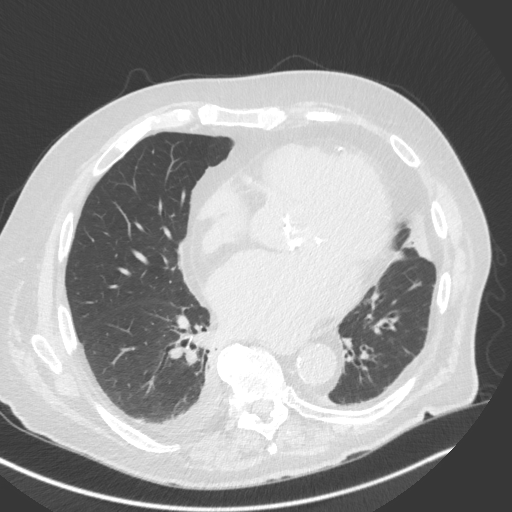
[im 75/159  lung]
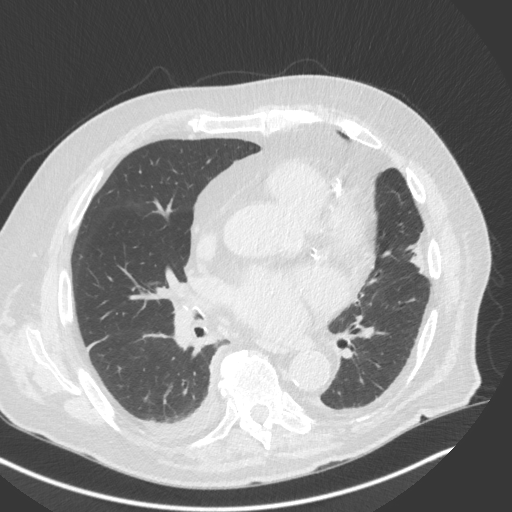
[im 85/159  lung]
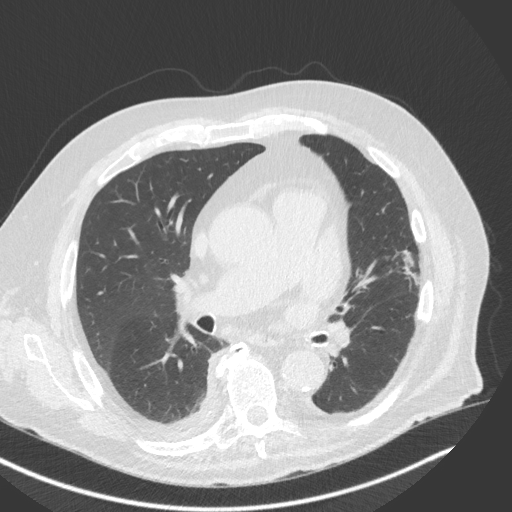
[im 94/159  mediastinal]
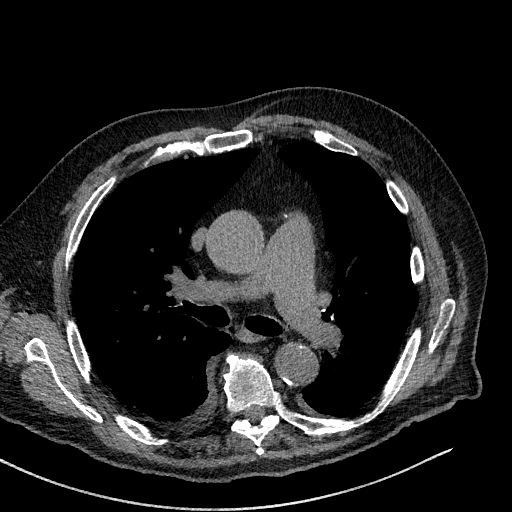
[im 94/159  lung]
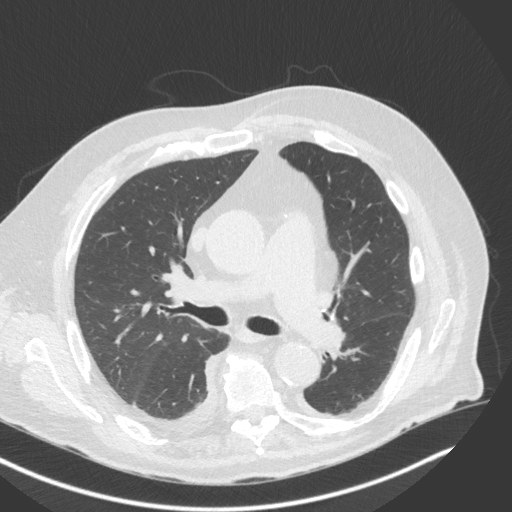
[im 100/159  lung]
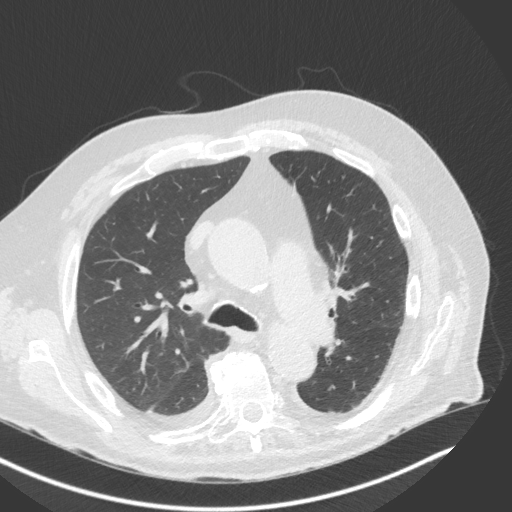
[im 118/159  lung]
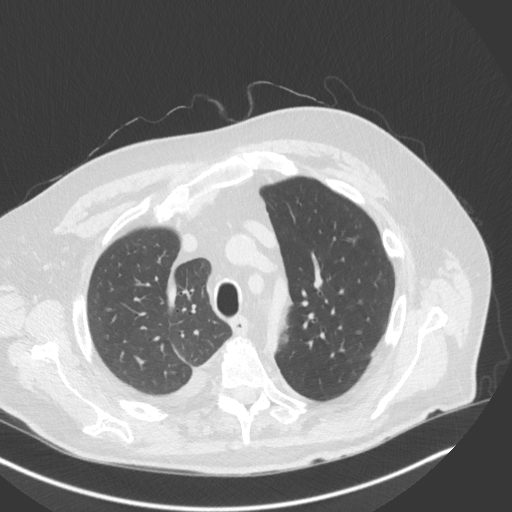
[im 127/159  lung]
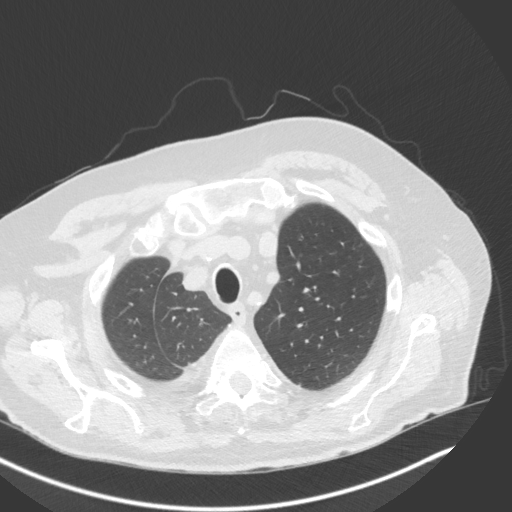
[im 135/159  mediastinal]
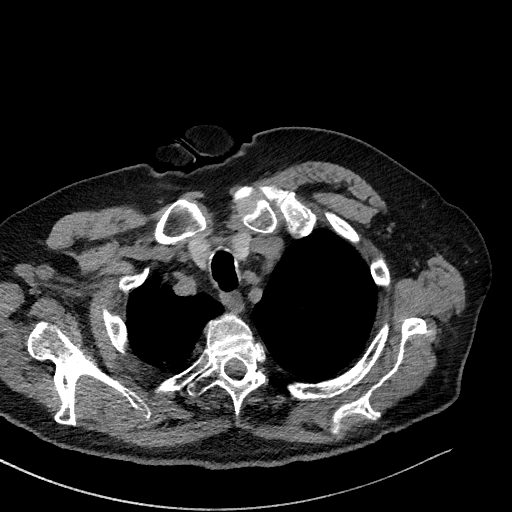
[im 135/159  lung]
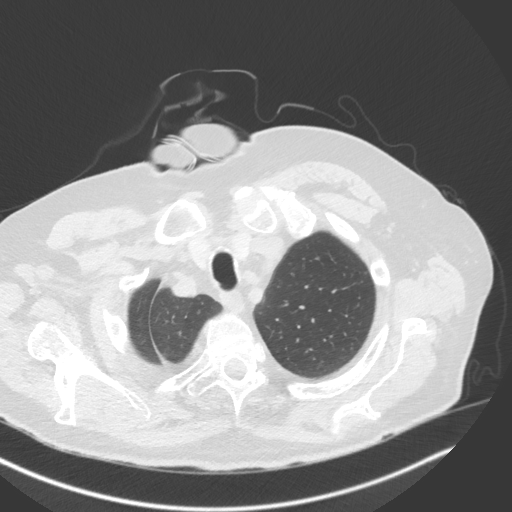
[im 147/159  lung]
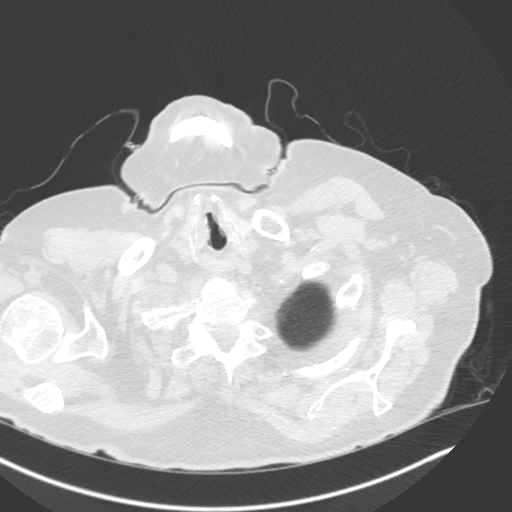

[14 of 34 positions shown; findings below may reference images not displayed]

FINDINGS: Cardiovascular: Limited evaluation in the absence of intravenous
contrast. Two vessel aortic arch. Right brachiocephalic and left
common carotid artery share a common origin. Scattered
atherosclerotic plaque. Mild fusiform aneurysmal dilation of the
ascending thoracic aorta with a maximal diameter of 4.3 cm. The
aortic root is normal in caliber. No effacement of the Ana Luz
junction. Calcified and thickened aortic valve. Extensive
atherosclerotic calcifications along all coronary arteries.
Generalized cardiomegaly. Marked enlargement of the left atrium and
atrial appendage. No pericardial effusion. Main pulmonary artery is
normal in size.

Mediastinum/Nodes: Unremarkable CT appearance of the thyroid gland.
No suspicious mediastinal or hilar adenopathy. No soft tissue
mediastinal mass. The thoracic esophagus is unremarkable.

Lungs/Pleura: Trace right pleural effusion. An azygous fissure is
noted. This is an anatomic variant of no clinical significance.
Bilateral lower lobe atelectasis. Mild diffuse bronchial wall
thickening with peripheral bronchiectasis in the lower lobes and
lateral periphery of the lingula. No suspicious pulmonary mass or
nodule. No pneumothorax.

Upper Abdomen: Grossly nodular liver consistent with hepatic
cirrhosis. No obvious mass given limitations of absent intravenous
contrast. Similar appearance of large simple cyst exophytic from the
upper pole of the right kidney. No acute abnormality.

Musculoskeletal: No acute fracture or aggressive appearing lytic or
blastic osseous lesion.
IMPRESSION: 1. Stable mild aneurysmal dilation of the ascending thoracic aorta
at 4.3 cm. Recommend annual imaging followup by CTA or MRA. This
recommendation follows 0010
ACCF/AHA/AATS/ACR/ASA/SCA/PER-IVAN/ZINNER/SAGITARIJUS/ALIN Guidelines for the
Diagnosis and Management of Patients with Thoracic Aortic Disease.
Circulation. 0010; 121: E266-e369. Aortic aneurysm NOS
(D9F08-693.6); Aortic Atherosclerosis (D9F08-IOC.C).
2. Diffuse mild bronchial wall thickening with mild bronchiectasis
in the inferior bilateral lower lobes and inferolateral periphery of
the lingula. Findings consistent with the clinical history of COPD.
3. No suspicious mass or pulmonary nodule.
4. Cardiomegaly with marked enlargement of the left atrium and left
atrial appendage.
5. Trace right pleural effusion.
6. Hepatic cirrhosis.
7. Right upper pole renal cyst.

## 2023-09-26 NOTE — Progress Notes (Signed)
[facility] - Clinic Note  10/09/2023     CHIEF COMPLAINT Patient presents for Retina Follow Up   HISTORY OF PRESENT ILLNESS: Warren Wagner. is a 88 y.o. male who presents to the clinic today for:   HPI     Retina Follow Up   Patient presents with  Wet AMD.  In right eye.  This started 7 weeks ago.  I, the attending physician,  performed the HPI with the patient and updated documentation appropriately.        Comments   Patient here for 7 weeks retina follow up for exu ARMD OD. Patient states vision about the same. No eye pain.       Last edited by Rennis Chris, MD on 10/09/2023 11:46 AM.     Referring physician: Melida Quitter, PA [address] [place],  [place] [address]  HISTORICAL INFORMATION:   Selected notes from the MEDICAL RECORD NUMBER [name] transferring care due to insurance LEE: 03.27.23 [BCVA 20/40 OD, CF OS] Ocular Hx- ex ARMD OU; last injection was IVA OS on 05.24.21  PMH-    CURRENT MEDICATIONS: No current outpatient medications on file. (Ophthalmic Drugs)   No current facility-administered medications for this visit. (Ophthalmic Drugs)   Current Outpatient Medications (Other)  Medication Sig   acetaminophen (TYLENOL) 500 MG tablet Take 1,000 mg by mouth every 6 (six) hours as needed for moderate pain or headache.   amoxicillin (AMOXIL) 500 MG tablet Take 4 tablets (2,000 mg total) by mouth as directed. 1 hour prior to dental work including cleanings   cetirizine (ZYRTEC) 10 MG tablet Take 1 tablet by mouth once daily   docusate sodium (COLACE) 100 MG capsule Take 100 mg by mouth daily.   finasteride (PROSCAR) 5 MG tablet Take 1 tablet by mouth once daily   Fluticasone-Umeclidin-Vilant (TRELEGY ELLIPTA) 100-62.5-25 MCG/ACT AEPB INHALE 1 PUFF ONCE DAILY   furosemide (LASIX) 40 MG tablet Take 1 tablet (40 mg total) by mouth daily. (Patient taking differently: Take 20-40 mg by mouth See admin instructions.  Taking 20 mg daily except on a day when weight gain is noticed can take 40 mg daily)   memantine (NAMENDA) 10 MG tablet Take 1 tablet by mouth twice daily   Multiple Vitamins-Minerals (MULTIVITAMIN WITH MINERALS) tablet Take 1 tablet by mouth daily.   Multiple Vitamins-Minerals (OCUVITE PO) Take 1 tablet by mouth daily.   OMEGA-3 FATTY ACIDS PO Take 500 mg by mouth daily.   sodium chloride (OCEAN) 0.65 % SOLN nasal spray Place 1 spray into both nostrils daily as needed for congestion.   warfarin (COUMADIN) 5 MG tablet TAKE 1/2 TO 1 (ONE-HALF TO ONE) TABLET BY MOUTH ONCE DAILY AS DIRECTED BY ANTICOAGULATION CLINIC   metoprolol succinate (TOPROL-XL) 50 MG 24 hr tablet Take 0.5 tablets (25 mg total) by mouth daily. Take with or immediately following a meal. May split current pills in half until bottle is gone.   No current facility-administered medications for this visit. (Other)   REVIEW OF SYSTEMS: ROS   Positive for: Cardiovascular, Eyes Negative for: Constitutional, Gastrointestinal, Neurological, Skin, Genitourinary, Musculoskeletal, HENT, Endocrine, Respiratory, Psychiatric, Allergic/Imm, Heme/Lymph Last edited by Laddie Aquas, COA on 10/09/2023  8:57 AM.      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 Clifton James & Dr. Laneta Simmers   Severe aortic stenosis    Umbilical hernia 07/27/2012   Past Surgical History:  Procedure Laterality Date   CARDIOVERSION  10/10/2006   cataracts Bilateral    HERNIA REPAIR  09/10/2012   large umbilical hernia   INSERTION OF MESH  09/10/2012   Procedure: INSERTION OF MESH;  Surgeon: [name], MD;   Location: [facility] OR;  Service: General;  Laterality: N/A;   INTRAOPERATIVE TRANSTHORACIC ECHOCARDIOGRAM N/A 10/18/2022   Procedure: INTRAOPERATIVE TRANSTHORACIC ECHOCARDIOGRAM;  Surgeon: Kathleene Hazel, MD;  Location: [facility] INVASIVE CV LAB;  Service: Open Heart Surgery;  Laterality: N/A;   macular degeneration Bilateral    RIGHT HEART CATH AND CORONARY ANGIOGRAPHY N/A 09/09/2022   Procedure: RIGHT HEART CATH AND CORONARY ANGIOGRAPHY;  Surgeon: Kathleene Hazel, MD;  Location: [facility] INVASIVE CV LAB;  Service: Cardiovascular;  Laterality: N/A;   TONSILLECTOMY     "maybe" (09/10/2012)   TRANSCATHETER AORTIC VALVE REPLACEMENT, TRANSFEMORAL N/A 10/18/2022   Procedure: Transcatheter Aortic Valve Replacement, Transfemoral;  Surgeon: Kathleene Hazel, MD;  Location: [facility] INVASIVE CV LAB;  Service: Open Heart Surgery;  Laterality: N/A;   UMBILICAL HERNIA REPAIR  09/10/2012   Procedure: HERNIA REPAIR UMBILICAL ADULT;  Surgeon: [name], MD;  Location: [facility] OR;  Service: General;  Laterality: N/A;   FAMILY HISTORY Family History  Problem Relation Age of Onset   Heart disease Mother    Stroke Father    SOCIAL HISTORY Social History   Tobacco Use   Smoking status: Former    Current packs/day: 0.00    Average packs/day: 1.5 packs/day for 10.0 years (15.0 ttl pk-yrs)    Types: Cigarettes    Start date: 07/27/1972    Quit date: 07/27/1982    Years since quitting: 41.2   Smokeless tobacco: Never  Vaping Use   Vaping status: Never Used  Substance Use Topics   Drug use: No       OPHTHALMIC EXAM:  Base Eye Exam     Visual Acuity (Snellen - Linear)       Right Left   Dist [place] 20/150 CF at 3'   Dist ph [place] 20/70 NI         Tonometry (Tonopen, 8:55 AM)       Right Left   Pressure 13 16         Pupils       Dark Light Shape React APD   Right 2 1 Round Brisk None   Left 2 1 Round Brisk None         Visual Fields (Counting fingers)       Left Right    Full Full          Extraocular Movement       Right Left    Full, Ortho Full, Ortho         Neuro/Psych     Oriented x3: Yes   Mood/Affect: Normal         Dilation     Both eyes: 1.0% Mydriacyl, 2.5% Phenylephrine @ 8:54 AM           Slit Lamp and Fundus Exam     External Exam       Right Left   External Normal Normal  Slit Lamp Exam       Right Left   Lids/Lashes Dermatochalasis - upper lid, Ptosis Dermatochalasis - upper lid, mild MGD   Conjunctiva/Sclera White and quiet White and quiet   Cornea 1-2+ Punctate epithelial erosions, mild arcus, well healed cataract wound, tear film debris Trace tear film debris, well healed cataract wound, arcus   Anterior Chamber deep and clear deep and clear   Iris Round and dilated round and poorly dilated   Lens PC IOL in good position, 1-2+ Posterior capsular opacification PC IOL in good position, 2-3+ Posterior capsular opacification   Anterior Vitreous mild syneresis Vitreous syneresis         Fundus Exam       Right Left   Posterior Vitreous Posterior vitreous detachment Posterior vitreous detachment, vitreous condensations   Disc 2+Pallor, Sharp rim, Compact 2+Pallor, Sharp rim   C/D Ratio 0.2 0.3   Macula Blunted foveal reflex, refractile drusen, RPE mottling, clumping and atrophy, focal central edema -- stably improved, no frank heme Flat, blunted foveal reflex, refractile drusen, RPE mottling, clumping and atrophy, central GA, no frank heme   Vessels attenuated, Tortuous attenuated, mild tortuosity   Periphery Attached, reticular degeneration, no heme Attached, reticlar degeneration, no heme           Refraction     Wearing Rx       Sphere Cylinder Axis Add   Right -0.75 +0.75 087 +2.75   Left -0.75 +0.75 083 +2.75           IMAGING AND PROCEDURES  Imaging and Procedures for 10/09/2023  OCT, Retina - OU - Both Eyes       Right Eye Quality was good. Scan locations included subfoveal.  Central Foveal Thickness: 221. Progression has improved. Findings include normal foveal contour, no IRF, retinal drusen , subretinal hyper-reflective material, pigment epithelial detachment, subretinal fluid, outer retinal atrophy (Patchy central ORA with PEDs -- slightly improved, central pocket of SRF -- stably improved).   Left Eye Quality was poor. Scan locations included subfoveal. Findings include no IRF, no SRF, abnormal foveal contour, retinal drusen , outer retinal tubulation, pigment epithelial detachment, outer retinal atrophy (Extra grainy image; central ORA / GA with PEDs and ORT).   Notes *Images captured and stored on drive  Diagnosis / Impression:  OD: Patchy central ORA with PEDs -- slightly improved, central pocket of SRF -- stably improved OS: non exudative ARMD - extra grainy image; central ORA / GA with PEDs and ORT  Clinical management:  See below  Abbreviations: NFP - Normal foveal profile. CME - cystoid macular edema. PED - pigment epithelial detachment. IRF - intraretinal fluid. SRF - subretinal fluid. EZ - ellipsoid zone. ERM - epiretinal membrane. ORA - outer retinal atrophy. ORT - outer retinal tubulation. SRHM - subretinal hyper-reflective material. IRHM - intraretinal hyper-reflective material      Intravitreal Injection, Pharmacologic Agent - OD - Right Eye       Time Out 10/09/2023. 9:14 AM. Confirmed correct patient, procedure, site, and patient consented.   Anesthesia Topical anesthesia was used. Anesthetic medications included Lidocaine 2%, Proparacaine 0.5%.   Procedure Preparation included 5% betadine to ocular surface, eyelid speculum. A (32g) needle was used.   Injection: 2 mg aflibercept 2 MG/0.05ML   Route: Intravitreal, Site: Right Eye   NDC: [id number], Lot: [id number], Expiration date: 02/05/2025, Waste: 0 mL   Post-op Post injection exam found visual acuity of at least counting fingers. The patient tolerated the procedure well. There  were no complications. The patient received written and verbal post procedure care education.             ASSESSMENT/PLAN:    ICD-10-CM   1. Exudative age-related macular degeneration of right eye with active choroidal neovascularization (HCC)  H35.3211 OCT, Retina - OU - Both Eyes    Intravitreal Injection, Pharmacologic Agent - OD - Right Eye    aflibercept (EYLEA) SOLN 2 mg    2. 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 Essential hypertension  I10     4. Hypertensive retinopathy of both eyes  H35.033     5. Pseudophakia, both eyes  Z96.1      Exudative age related macular degeneration, OD  - previous Dr. Luciana Axe pt here due to insurance  - pt reports history of injections OD w/ [name], but unable to see record of that - here s/p IVA OD #1 (02.07.24), #2 (03.13.24), #3 (04.17.24), #4 (05.31.24) -- IVA resistance  - s/p IVE OD #1 (06.28.24), #2 (07.29.24), #3 (08.26.24), #4 (09.30.24), #5 (11.11.24)  - BCVA OD 20/70 from 20/80 - OCT OD: Patchy central ORA with PEDs -- slightly improved, central pocket of SRF -- stably improved at 7 wks  - recommend IVE OD #6 today, 12.30.24 w/ f/u up extended to 8 wks - pt wishes to proceed with injection - RBA of procedure discussed, questions answered - IVE informed consent obtained and signed, 06.28.24 (OD) - see procedure note   - f/u in 8 wks -- DFE/OCT, possible injection  2. Age related macular degeneration, non-exudative, left eye  - advanced stage w/ significant central GA  - BCVA CF 3' -- stable   - history of IVA OS w/ [name], last 05.24.21  - OCT OS shows Central ORA / GA with PEDs and ORT  - Recommend amsler grid monitoring  - monitor  3,4. Hypertensive retinopathy OU - discussed importance of tight BP control - monitor  5. Pseudophakia OU  - s/p CE/IOL (Dr. Nile Riggs)  - IOL in good position, doing well  - monitor  Ophthalmic Meds Ordered  this visit:  Meds ordered this encounter  Medications   aflibercept (EYLEA) SOLN 2 mg     Return in about 8 weeks (around 12/04/2023) for f/u exu ARMD OD, DFE, OCT.  There are no Patient Instructions on file for this visit.   This document serves as a record of services personally performed by Karie Chimera, MD, PhD. It was created on their behalf by Glee Arvin. Manson Passey, OA an ophthalmic technician. The creation of this record is the provider's dictation and/or activities during the visit.    Electronically signed by: Glee Arvin. Manson Passey, OA 10/09/23 11:47 AM  Karie Chimera, M.D., Ph.D. Diseases & Surgery of the Retina and Vitreous [facility]  I have reviewed the above documentation for accuracy and completeness, and I agree with the above. Karie Chimera, M.D., Ph.D. 10/09/23 11:47 AM   Abbreviations: M myopia (nearsighted); A astigmatism; H hyperopia (farsighted); P presbyopia; Mrx spectacle prescription;  CTL contact lenses; OD right eye; OS left eye; OU both eyes  XT exotropia; ET esotropia; PEK punctate epithelial keratitis; PEE punctate epithelial erosions; DES dry eye syndrome; MGD meibomian gland dysfunction; ATs artificial tears; PFAT's preservative free artificial tears; NSC nuclear sclerotic cataract; PSC posterior subcapsular cataract; ERM epi-retinal membrane; PVD posterior vitreous detachment; RD retinal detachment; DM diabetes mellitus; DR diabetic  retinopathy; NPDR non-proliferative diabetic retinopathy; PDR proliferative diabetic retinopathy; CSME clinically significant macular edema; DME diabetic macular edema; dbh dot blot hemorrhages; CWS cotton wool spot; POAG primary open angle glaucoma; C/D cup-to-disc ratio; HVF humphrey visual field; GVF goldmann visual field; OCT optical coherence tomography; IOP intraocular pressure; BRVO Branch retinal vein occlusion; CRVO central retinal vein occlusion; CRAO central retinal artery occlusion; BRAO branch retinal  artery occlusion; RT retinal tear; SB scleral buckle; PPV pars plana vitrectomy; VH Vitreous hemorrhage; PRP panretinal laser photocoagulation; IVK intravitreal kenalog; VMT vitreomacular traction; MH Macular hole;  NVD neovascularization of the disc; NVE neovascularization elsewhere; AREDS age related eye disease study; ARMD age related macular degeneration; POAG primary open angle glaucoma; EBMD epithelial/anterior basement membrane dystrophy; ACIOL anterior chamber intraocular lens; IOL intraocular lens; PCIOL posterior chamber intraocular lens; Phaco/IOL phacoemulsification with intraocular lens placement; PRK photorefractive keratectomy; LASIK laser assisted in situ keratomileusis; HTN hypertension; DM diabetes mellitus; COPD chronic obstructive pulmonary disease

## 2023-09-26 NOTE — Patient Instructions (Signed)
Medication Instructions:  No changes.  *If you need a refill on your cardiac medications before your next appointment, please call your pharmacy*  Follow-Up: At [facility], you and your health needs are our priority.  As part of our continuing mission to provide you with exceptional heart care, we have created designated Provider Care Teams.  These Care Teams include your primary Cardiologist (physician) and Advanced Practice Providers (APPs -  Physician Assistants and Nurse Practitioners) who all work together to provide you with the care you need, when you need it.  Your next appointment:   6 month(s)  Provider:   Juanda Crumble, PA-C

## 2023-09-26 NOTE — Patient Instructions (Signed)
Continue taking 1 tablet daily except 0.5 tablet on Sunday, Monday, Wednesday and Friday.  Stay consistent with greens (2 times per week)  Recheck INR 6 weeks  Coumadin Clinic [phone]

## 2023-09-27 DIAGNOSIS — Z9181 History of falling: Secondary | ICD-10-CM | POA: Diagnosis not present

## 2023-09-27 DIAGNOSIS — Z85828 Personal history of other malignant neoplasm of skin: Secondary | ICD-10-CM | POA: Diagnosis not present

## 2023-09-27 DIAGNOSIS — D6869 Other thrombophilia: Secondary | ICD-10-CM | POA: Diagnosis not present

## 2023-09-27 DIAGNOSIS — I951 Orthostatic hypotension: Secondary | ICD-10-CM | POA: Diagnosis not present

## 2023-09-27 DIAGNOSIS — I4821 Permanent atrial fibrillation: Secondary | ICD-10-CM | POA: Diagnosis not present

## 2023-09-27 DIAGNOSIS — Z952 Presence of prosthetic heart valve: Secondary | ICD-10-CM | POA: Diagnosis not present

## 2023-09-27 DIAGNOSIS — N183 Chronic kidney disease, stage 3 unspecified: Secondary | ICD-10-CM | POA: Diagnosis not present

## 2023-09-27 DIAGNOSIS — Z556 Problems related to health literacy: Secondary | ICD-10-CM | POA: Diagnosis not present

## 2023-09-27 DIAGNOSIS — Z7901 Long term (current) use of anticoagulants: Secondary | ICD-10-CM | POA: Diagnosis not present

## 2023-09-27 DIAGNOSIS — Z7951 Long term (current) use of inhaled steroids: Secondary | ICD-10-CM | POA: Diagnosis not present

## 2023-09-27 DIAGNOSIS — I080 Rheumatic disorders of both mitral and aortic valves: Secondary | ICD-10-CM | POA: Diagnosis not present

## 2023-09-27 DIAGNOSIS — N400 Enlarged prostate without lower urinary tract symptoms: Secondary | ICD-10-CM | POA: Diagnosis not present

## 2023-09-27 DIAGNOSIS — Z87440 Personal history of urinary (tract) infections: Secondary | ICD-10-CM | POA: Diagnosis not present

## 2023-09-27 DIAGNOSIS — I10 Essential (primary) hypertension: Secondary | ICD-10-CM | POA: Diagnosis not present

## 2023-09-27 DIAGNOSIS — J449 Chronic obstructive pulmonary disease, unspecified: Secondary | ICD-10-CM | POA: Diagnosis not present

## 2023-09-27 DIAGNOSIS — Z87891 Personal history of nicotine dependence: Secondary | ICD-10-CM | POA: Diagnosis not present

## 2023-09-27 DIAGNOSIS — I712 Thoracic aortic aneurysm, without rupture, unspecified: Secondary | ICD-10-CM | POA: Diagnosis not present

## 2023-10-02 ENCOUNTER — Other Ambulatory Visit: Payer: Self-pay | Admitting: Neurology

## 2023-10-02 DIAGNOSIS — Z952 Presence of prosthetic heart valve: Secondary | ICD-10-CM | POA: Diagnosis not present

## 2023-10-02 DIAGNOSIS — I712 Thoracic aortic aneurysm, without rupture, unspecified: Secondary | ICD-10-CM | POA: Diagnosis not present

## 2023-10-02 DIAGNOSIS — Z7951 Long term (current) use of inhaled steroids: Secondary | ICD-10-CM | POA: Diagnosis not present

## 2023-10-02 DIAGNOSIS — N400 Enlarged prostate without lower urinary tract symptoms: Secondary | ICD-10-CM | POA: Diagnosis not present

## 2023-10-02 DIAGNOSIS — Z87440 Personal history of urinary (tract) infections: Secondary | ICD-10-CM | POA: Diagnosis not present

## 2023-10-02 DIAGNOSIS — I080 Rheumatic disorders of both mitral and aortic valves: Secondary | ICD-10-CM | POA: Diagnosis not present

## 2023-10-02 DIAGNOSIS — Z87891 Personal history of nicotine dependence: Secondary | ICD-10-CM | POA: Diagnosis not present

## 2023-10-02 DIAGNOSIS — Z7901 Long term (current) use of anticoagulants: Secondary | ICD-10-CM | POA: Diagnosis not present

## 2023-10-02 DIAGNOSIS — Z9181 History of falling: Secondary | ICD-10-CM | POA: Diagnosis not present

## 2023-10-02 DIAGNOSIS — Z556 Problems related to health literacy: Secondary | ICD-10-CM | POA: Diagnosis not present

## 2023-10-02 DIAGNOSIS — I951 Orthostatic hypotension: Secondary | ICD-10-CM | POA: Diagnosis not present

## 2023-10-02 DIAGNOSIS — Z85828 Personal history of other malignant neoplasm of skin: Secondary | ICD-10-CM | POA: Diagnosis not present

## 2023-10-02 DIAGNOSIS — I10 Essential (primary) hypertension: Secondary | ICD-10-CM | POA: Diagnosis not present

## 2023-10-02 DIAGNOSIS — I4821 Permanent atrial fibrillation: Secondary | ICD-10-CM | POA: Diagnosis not present

## 2023-10-02 NOTE — Telephone Encounter (Signed)
Rx refilled per last office visit note.

## 2023-10-09 ENCOUNTER — Ambulatory Visit (INDEPENDENT_AMBULATORY_CARE_PROVIDER_SITE_OTHER): Payer: Medicare Other | Admitting: Ophthalmology

## 2023-10-09 ENCOUNTER — Encounter (INDEPENDENT_AMBULATORY_CARE_PROVIDER_SITE_OTHER): Payer: Self-pay | Admitting: Ophthalmology

## 2023-10-09 DIAGNOSIS — H353124 Nonexudative age-related macular degeneration, left eye, advanced atrophic with subfoveal involvement: Secondary | ICD-10-CM | POA: Diagnosis not present

## 2023-10-09 DIAGNOSIS — H353211 Exudative age-related macular degeneration, right eye, with active choroidal neovascularization: Secondary | ICD-10-CM | POA: Diagnosis not present

## 2023-10-09 DIAGNOSIS — I10 Essential (primary) hypertension: Secondary | ICD-10-CM | POA: Diagnosis not present

## 2023-10-09 DIAGNOSIS — H35033 Hypertensive retinopathy, bilateral: Secondary | ICD-10-CM

## 2023-10-09 DIAGNOSIS — Z961 Presence of intraocular lens: Secondary | ICD-10-CM

## 2023-10-09 MED ORDER — AFLIBERCEPT 2MG/0.05ML IZ SOLN FOR KALEIDOSCOPE
2.0000 mg | INTRAVITREAL | Status: AC | PRN
  Administered 2023-10-09: 2 mg via INTRAVITREAL

## 2023-10-10 ENCOUNTER — Other Ambulatory Visit: Payer: Self-pay | Admitting: Family Medicine

## 2023-10-10 DIAGNOSIS — Z952 Presence of prosthetic heart valve: Secondary | ICD-10-CM | POA: Diagnosis not present

## 2023-10-10 DIAGNOSIS — J3089 Other allergic rhinitis: Secondary | ICD-10-CM

## 2023-10-10 DIAGNOSIS — N400 Enlarged prostate without lower urinary tract symptoms: Secondary | ICD-10-CM | POA: Diagnosis not present

## 2023-10-10 DIAGNOSIS — Z87891 Personal history of nicotine dependence: Secondary | ICD-10-CM | POA: Diagnosis not present

## 2023-10-10 DIAGNOSIS — Z7901 Long term (current) use of anticoagulants: Secondary | ICD-10-CM | POA: Diagnosis not present

## 2023-10-10 DIAGNOSIS — Z85828 Personal history of other malignant neoplasm of skin: Secondary | ICD-10-CM | POA: Diagnosis not present

## 2023-10-10 DIAGNOSIS — Z9181 History of falling: Secondary | ICD-10-CM | POA: Diagnosis not present

## 2023-10-10 DIAGNOSIS — Z556 Problems related to health literacy: Secondary | ICD-10-CM | POA: Diagnosis not present

## 2023-10-10 DIAGNOSIS — I712 Thoracic aortic aneurysm, without rupture, unspecified: Secondary | ICD-10-CM | POA: Diagnosis not present

## 2023-10-10 DIAGNOSIS — Z7951 Long term (current) use of inhaled steroids: Secondary | ICD-10-CM | POA: Diagnosis not present

## 2023-10-10 DIAGNOSIS — I4821 Permanent atrial fibrillation: Secondary | ICD-10-CM | POA: Diagnosis not present

## 2023-10-10 DIAGNOSIS — Z87440 Personal history of urinary (tract) infections: Secondary | ICD-10-CM | POA: Diagnosis not present

## 2023-10-10 DIAGNOSIS — I10 Essential (primary) hypertension: Secondary | ICD-10-CM | POA: Diagnosis not present

## 2023-10-10 DIAGNOSIS — I080 Rheumatic disorders of both mitral and aortic valves: Secondary | ICD-10-CM | POA: Diagnosis not present

## 2023-10-10 DIAGNOSIS — I951 Orthostatic hypotension: Secondary | ICD-10-CM | POA: Diagnosis not present

## 2023-10-14 ENCOUNTER — Other Ambulatory Visit: Payer: Self-pay | Admitting: Family Medicine

## 2023-10-14 DIAGNOSIS — J449 Chronic obstructive pulmonary disease, unspecified: Secondary | ICD-10-CM

## 2023-10-16 DIAGNOSIS — Z87891 Personal history of nicotine dependence: Secondary | ICD-10-CM | POA: Diagnosis not present

## 2023-10-16 DIAGNOSIS — Z556 Problems related to health literacy: Secondary | ICD-10-CM | POA: Diagnosis not present

## 2023-10-16 DIAGNOSIS — Z7901 Long term (current) use of anticoagulants: Secondary | ICD-10-CM | POA: Diagnosis not present

## 2023-10-16 DIAGNOSIS — I4821 Permanent atrial fibrillation: Secondary | ICD-10-CM | POA: Diagnosis not present

## 2023-10-16 DIAGNOSIS — I712 Thoracic aortic aneurysm, without rupture, unspecified: Secondary | ICD-10-CM | POA: Diagnosis not present

## 2023-10-16 DIAGNOSIS — I951 Orthostatic hypotension: Secondary | ICD-10-CM | POA: Diagnosis not present

## 2023-10-16 DIAGNOSIS — I10 Essential (primary) hypertension: Secondary | ICD-10-CM | POA: Diagnosis not present

## 2023-10-16 DIAGNOSIS — Z9181 History of falling: Secondary | ICD-10-CM | POA: Diagnosis not present

## 2023-10-16 DIAGNOSIS — Z85828 Personal history of other malignant neoplasm of skin: Secondary | ICD-10-CM | POA: Diagnosis not present

## 2023-10-16 DIAGNOSIS — Z7951 Long term (current) use of inhaled steroids: Secondary | ICD-10-CM | POA: Diagnosis not present

## 2023-10-16 DIAGNOSIS — I080 Rheumatic disorders of both mitral and aortic valves: Secondary | ICD-10-CM | POA: Diagnosis not present

## 2023-10-16 DIAGNOSIS — Z952 Presence of prosthetic heart valve: Secondary | ICD-10-CM | POA: Diagnosis not present

## 2023-10-16 DIAGNOSIS — N400 Enlarged prostate without lower urinary tract symptoms: Secondary | ICD-10-CM | POA: Diagnosis not present

## 2023-10-16 DIAGNOSIS — Z87440 Personal history of urinary (tract) infections: Secondary | ICD-10-CM | POA: Diagnosis not present

## 2023-10-16 NOTE — Telephone Encounter (Signed)
 Verbal orders approved for ongoing physical therapy once a week for 4 weeks

## 2023-10-16 NOTE — Telephone Encounter (Signed)
 Copied from CRM [id number]. Topic: Clinical - Home Health Verbal Orders >> Oct 16, 2023  9:32 AM Alfonso ORN wrote: Caller/Agency: april williams with [facility] Number: [phone] Service Requested: ongoing physical therapy  Frequency: 1 time a week for 4 weeks Any new concerns about the patient? N/a

## 2023-10-24 ENCOUNTER — Encounter: Payer: Self-pay | Admitting: Family Medicine

## 2023-10-24 DIAGNOSIS — R296 Repeated falls: Secondary | ICD-10-CM

## 2023-10-24 DIAGNOSIS — K080 Exfoliation of teeth due to systemic causes: Secondary | ICD-10-CM | POA: Diagnosis not present

## 2023-10-24 DIAGNOSIS — R2689 Other abnormalities of gait and mobility: Secondary | ICD-10-CM

## 2023-10-24 DIAGNOSIS — R42 Dizziness and giddiness: Secondary | ICD-10-CM

## 2023-10-24 DIAGNOSIS — R413 Other amnesia: Secondary | ICD-10-CM

## 2023-10-24 DIAGNOSIS — Z7901 Long term (current) use of anticoagulants: Secondary | ICD-10-CM

## 2023-10-24 DIAGNOSIS — J449 Chronic obstructive pulmonary disease, unspecified: Secondary | ICD-10-CM

## 2023-10-24 DIAGNOSIS — R531 Weakness: Secondary | ICD-10-CM

## 2023-10-24 NOTE — Telephone Encounter (Signed)
 Community message sent to see about getting this ordered for the patient.

## 2023-10-26 DIAGNOSIS — I10 Essential (primary) hypertension: Secondary | ICD-10-CM | POA: Diagnosis not present

## 2023-10-26 DIAGNOSIS — Z87440 Personal history of urinary (tract) infections: Secondary | ICD-10-CM | POA: Diagnosis not present

## 2023-10-26 DIAGNOSIS — Z556 Problems related to health literacy: Secondary | ICD-10-CM | POA: Diagnosis not present

## 2023-10-26 DIAGNOSIS — Z85828 Personal history of other malignant neoplasm of skin: Secondary | ICD-10-CM | POA: Diagnosis not present

## 2023-10-26 DIAGNOSIS — Z952 Presence of prosthetic heart valve: Secondary | ICD-10-CM | POA: Diagnosis not present

## 2023-10-26 DIAGNOSIS — Z87891 Personal history of nicotine dependence: Secondary | ICD-10-CM | POA: Diagnosis not present

## 2023-10-26 DIAGNOSIS — Z9181 History of falling: Secondary | ICD-10-CM | POA: Diagnosis not present

## 2023-10-26 DIAGNOSIS — Z792 Long term (current) use of antibiotics: Secondary | ICD-10-CM | POA: Diagnosis not present

## 2023-10-26 DIAGNOSIS — Z7901 Long term (current) use of anticoagulants: Secondary | ICD-10-CM | POA: Diagnosis not present

## 2023-10-26 DIAGNOSIS — I080 Rheumatic disorders of both mitral and aortic valves: Secondary | ICD-10-CM | POA: Diagnosis not present

## 2023-10-26 DIAGNOSIS — Z7951 Long term (current) use of inhaled steroids: Secondary | ICD-10-CM | POA: Diagnosis not present

## 2023-10-26 DIAGNOSIS — N400 Enlarged prostate without lower urinary tract symptoms: Secondary | ICD-10-CM | POA: Diagnosis not present

## 2023-10-26 DIAGNOSIS — I4821 Permanent atrial fibrillation: Secondary | ICD-10-CM | POA: Diagnosis not present

## 2023-10-26 DIAGNOSIS — I951 Orthostatic hypotension: Secondary | ICD-10-CM | POA: Diagnosis not present

## 2023-10-26 DIAGNOSIS — I712 Thoracic aortic aneurysm, without rupture, unspecified: Secondary | ICD-10-CM | POA: Diagnosis not present

## 2023-10-30 DIAGNOSIS — I712 Thoracic aortic aneurysm, without rupture, unspecified: Secondary | ICD-10-CM | POA: Diagnosis not present

## 2023-10-30 DIAGNOSIS — Z9181 History of falling: Secondary | ICD-10-CM | POA: Diagnosis not present

## 2023-10-30 DIAGNOSIS — I951 Orthostatic hypotension: Secondary | ICD-10-CM | POA: Diagnosis not present

## 2023-10-30 DIAGNOSIS — Z7951 Long term (current) use of inhaled steroids: Secondary | ICD-10-CM | POA: Diagnosis not present

## 2023-10-30 DIAGNOSIS — Z792 Long term (current) use of antibiotics: Secondary | ICD-10-CM | POA: Diagnosis not present

## 2023-10-30 DIAGNOSIS — Z556 Problems related to health literacy: Secondary | ICD-10-CM | POA: Diagnosis not present

## 2023-10-30 DIAGNOSIS — I4821 Permanent atrial fibrillation: Secondary | ICD-10-CM | POA: Diagnosis not present

## 2023-10-30 DIAGNOSIS — I080 Rheumatic disorders of both mitral and aortic valves: Secondary | ICD-10-CM | POA: Diagnosis not present

## 2023-10-30 DIAGNOSIS — Z7901 Long term (current) use of anticoagulants: Secondary | ICD-10-CM | POA: Diagnosis not present

## 2023-10-30 DIAGNOSIS — Z87891 Personal history of nicotine dependence: Secondary | ICD-10-CM | POA: Diagnosis not present

## 2023-10-30 DIAGNOSIS — N400 Enlarged prostate without lower urinary tract symptoms: Secondary | ICD-10-CM | POA: Diagnosis not present

## 2023-10-30 DIAGNOSIS — Z85828 Personal history of other malignant neoplasm of skin: Secondary | ICD-10-CM | POA: Diagnosis not present

## 2023-10-30 DIAGNOSIS — Z952 Presence of prosthetic heart valve: Secondary | ICD-10-CM | POA: Diagnosis not present

## 2023-10-30 DIAGNOSIS — I10 Essential (primary) hypertension: Secondary | ICD-10-CM | POA: Diagnosis not present

## 2023-10-30 DIAGNOSIS — Z87440 Personal history of urinary (tract) infections: Secondary | ICD-10-CM | POA: Diagnosis not present

## 2023-11-02 ENCOUNTER — Ambulatory Visit (INDEPENDENT_AMBULATORY_CARE_PROVIDER_SITE_OTHER): Payer: Medicare Other | Admitting: Family Medicine

## 2023-11-02 ENCOUNTER — Encounter: Payer: Self-pay | Admitting: Family Medicine

## 2023-11-02 VITALS — BP 130/81 | HR 70 | Ht 72.0 in | Wt 222.8 lb

## 2023-11-02 DIAGNOSIS — R296 Repeated falls: Secondary | ICD-10-CM

## 2023-11-02 DIAGNOSIS — R55 Syncope and collapse: Secondary | ICD-10-CM

## 2023-11-02 DIAGNOSIS — R600 Localized edema: Secondary | ICD-10-CM

## 2023-11-02 DIAGNOSIS — J449 Chronic obstructive pulmonary disease, unspecified: Secondary | ICD-10-CM

## 2023-11-02 DIAGNOSIS — R2681 Unsteadiness on feet: Secondary | ICD-10-CM | POA: Diagnosis not present

## 2023-11-02 DIAGNOSIS — R531 Weakness: Secondary | ICD-10-CM | POA: Diagnosis not present

## 2023-11-02 DIAGNOSIS — Z7901 Long term (current) use of anticoagulants: Secondary | ICD-10-CM

## 2023-11-02 DIAGNOSIS — R42 Dizziness and giddiness: Secondary | ICD-10-CM

## 2023-11-02 DIAGNOSIS — R2689 Other abnormalities of gait and mobility: Secondary | ICD-10-CM | POA: Diagnosis not present

## 2023-11-02 DIAGNOSIS — R413 Other amnesia: Secondary | ICD-10-CM

## 2023-11-02 DIAGNOSIS — S9032XA Contusion of left foot, initial encounter: Secondary | ICD-10-CM

## 2023-11-02 DIAGNOSIS — S40812A Abrasion of left upper arm, initial encounter: Secondary | ICD-10-CM

## 2023-11-02 NOTE — Patient Instructions (Signed)
I would recommend doubling the furosemide (Lasix) for the next 3 days to decrease the swelling.  If you are currently taking 20 mg daily, double it to 40 mg daily, or if you are already taking 40 mg daily, double it to 80 mg daily.  If the swelling is not alleviated by that time, you can continue the double dose for an additional 2 days.  Continue with your plan to keep the feet elevated is much as possible.  If after week the swelling has not improved and the bruising has not improved, get in touch with me to let me know!

## 2023-11-02 NOTE — Progress Notes (Addendum)
Established Patient Office Visit  Subjective   Patient ID: Warren Westervelt., male    DOB: 1935-01-29  Age: 89 y.o. MRN: [id number]  Chief Complaint  Patient presents with   Face to Face Evaluation for a Hospital Bed    HPI Warren Wagner. is a 89 y.o. male presenting today to discuss the need for a hospital bed at home.  He has a history of postural dizziness with presyncope, COPD, and atrial fibrillation for which he is on long-term anticoagulants.  More recently, he has experienced memory changes, balance issues, and frequent falls.  His most recent fall was this morning when he rolled out of bed.  This has resulted in skin tears, and given his use of anticoagulants he is at an increased risk of bleeding.  He would benefit from a hospital bed at home in order to prevent him from rolling out of bed and being able to position the bed differently will assist him in sitting up from laying down, and standing up from sitting given his weakness and balance issues.  He also has a skin tear with surrounding bruising today still healing from when he fell several weeks ago.  Denies fever, chills, erythema.  Lastly, his feet have been swollen and there is ecchymosis present on some of his toes.  Outpatient Medications Prior to Visit  Medication Sig   acetaminophen (TYLENOL) 500 MG tablet Take 1,000 mg by mouth every 6 (six) hours as needed for moderate pain or headache.   amoxicillin (AMOXIL) 500 MG tablet Take 4 tablets (2,000 mg total) by mouth as directed. 1 hour prior to dental work including cleanings   cetirizine (ZYRTEC) 10 MG tablet Take 1 tablet by mouth once daily   docusate sodium (COLACE) 100 MG capsule Take 100 mg by mouth daily.   finasteride (PROSCAR) 5 MG tablet Take 1 tablet by mouth once daily   furosemide (LASIX) 40 MG tablet Take 1 tablet (40 mg total) by mouth daily. (Patient taking differently: Take 20-40 mg by mouth See admin instructions. Taking 20 mg daily except on a  day when weight gain is noticed can take 40 mg daily)   memantine (NAMENDA) 10 MG tablet Take 1 tablet by mouth twice daily   Multiple Vitamins-Minerals (MULTIVITAMIN WITH MINERALS) tablet Take 1 tablet by mouth daily.   Multiple Vitamins-Minerals (OCUVITE PO) Take 1 tablet by mouth daily.   OMEGA-3 FATTY ACIDS PO Take 500 mg by mouth daily.   sodium chloride (OCEAN) 0.65 % SOLN nasal spray Place 1 spray into both nostrils daily as needed for congestion.   TRELEGY ELLIPTA 100-62.5-25 MCG/ACT AEPB INHALE 1 PUFF ONCE DAILY   warfarin (COUMADIN) 5 MG tablet TAKE 1/2 TO 1 (ONE-HALF TO ONE) TABLET BY MOUTH ONCE DAILY AS DIRECTED BY ANTICOAGULATION CLINIC   metoprolol succinate (TOPROL-XL) 50 MG 24 hr tablet Take 0.5 tablets (25 mg total) by mouth daily. Take with or immediately following a meal. May split current pills in half until bottle is gone.   No facility-administered medications prior to visit.    ROS Negative unless otherwise noted in HPI   Objective:     BP 130/81   Pulse 70   Ht 6' (1.829 m)   Wt 222 lb 12 oz (101 kg)   SpO2 95%   BMI 30.21 kg/m   Physical Exam Constitutional:      General: He is not in acute distress.    Appearance: Normal appearance.  HENT:  Head: Normocephalic and atraumatic.  Cardiovascular:     Rate and Rhythm: Normal rate and regular rhythm.     Heart sounds: Normal heart sounds. No murmur heard.    No friction rub. No gallop.  Pulmonary:     Effort: Pulmonary effort is normal. No respiratory distress.     Breath sounds: Normal breath sounds. No wheezing, rhonchi or rales.  Skin:    General: Skin is warm and dry.     Findings: Abrasion (3 cm diameter abrasion on flexor surface of right forearm.  Surrounding bruising is now a yellow color.  Nontender, nonerythematous, without warmth.  There is new tissue around the edges of the abrasion, overall healing well) and ecchymosis (Ecchymosis present on right fourth toe and along the MTP joints of 2nd  through 4th toes of left foot.  Positive for 2+ pitting edema both feet distally from the ankle) present.       Neurological:     Mental Status: He is alert and oriented to person, place, and time.  Psychiatric:        Mood and Affect: Mood normal.      Assessment & Plan:  Frequent falls  Unsteady  Weakness  Balance problem  Chronic obstructive pulmonary disease, unspecified COPD type (HCC)  Memory changes  Postural dizziness with presyncope  Long term (current) use of anticoagulants  Abrasion of left upper extremity, initial encounter  Pedal edema  Contusion of left foot, initial encounter  Ordering hospital bed for home use. The patient requires the head of the bed to be elevated more than 30 degrees most of the time due to congestive heart failure, chronic pulmonary disease, or problems with aspiration. The patient requires positioning of the body not feasible with an ordinary bed in order to alleviate pain.  For abrasion of left forearm, healing is going well.  Recommend to continue keeping the area clean and dry and to allow the healing process to continue.  If warmth or erythema develops, contact PCP.  Bruising of feet may be the result of increased swelling that has worsened to some of the capillaries.  For pedal edema and resulting contusions, recommend doubling the furosemide (Lasix) for the next 3 days to decrease the swelling.  If currently taking 20 mg daily, double it to 40 mg daily, or if already taking 40 mg daily, double it to 80 mg daily.  If the swelling is not alleviated by that time, may continue the double dose for an additional 2 days.  Recommend elevating the feet is much as possible and using compression socks.  Patient will contact PCP if swelling and bruising of feet has not improved after 1 week.   Return in about 6 weeks (around 12/11/2023) for follow up (as scheduled).    Melida Quitter, PA

## 2023-11-03 DIAGNOSIS — C44329 Squamous cell carcinoma of skin of other parts of face: Secondary | ICD-10-CM | POA: Diagnosis not present

## 2023-11-06 ENCOUNTER — Telehealth: Payer: Self-pay

## 2023-11-06 DIAGNOSIS — Z85828 Personal history of other malignant neoplasm of skin: Secondary | ICD-10-CM | POA: Diagnosis not present

## 2023-11-06 DIAGNOSIS — Z952 Presence of prosthetic heart valve: Secondary | ICD-10-CM | POA: Diagnosis not present

## 2023-11-06 DIAGNOSIS — I10 Essential (primary) hypertension: Secondary | ICD-10-CM | POA: Diagnosis not present

## 2023-11-06 DIAGNOSIS — Z7951 Long term (current) use of inhaled steroids: Secondary | ICD-10-CM | POA: Diagnosis not present

## 2023-11-06 DIAGNOSIS — Z9181 History of falling: Secondary | ICD-10-CM | POA: Diagnosis not present

## 2023-11-06 DIAGNOSIS — Z7901 Long term (current) use of anticoagulants: Secondary | ICD-10-CM | POA: Diagnosis not present

## 2023-11-06 DIAGNOSIS — N400 Enlarged prostate without lower urinary tract symptoms: Secondary | ICD-10-CM | POA: Diagnosis not present

## 2023-11-06 DIAGNOSIS — I080 Rheumatic disorders of both mitral and aortic valves: Secondary | ICD-10-CM | POA: Diagnosis not present

## 2023-11-06 DIAGNOSIS — I951 Orthostatic hypotension: Secondary | ICD-10-CM | POA: Diagnosis not present

## 2023-11-06 DIAGNOSIS — I4821 Permanent atrial fibrillation: Secondary | ICD-10-CM | POA: Diagnosis not present

## 2023-11-06 DIAGNOSIS — Z556 Problems related to health literacy: Secondary | ICD-10-CM | POA: Diagnosis not present

## 2023-11-06 DIAGNOSIS — Z792 Long term (current) use of antibiotics: Secondary | ICD-10-CM | POA: Diagnosis not present

## 2023-11-06 DIAGNOSIS — I712 Thoracic aortic aneurysm, without rupture, unspecified: Secondary | ICD-10-CM | POA: Diagnosis not present

## 2023-11-06 DIAGNOSIS — Z87440 Personal history of urinary (tract) infections: Secondary | ICD-10-CM | POA: Diagnosis not present

## 2023-11-06 DIAGNOSIS — Z87891 Personal history of nicotine dependence: Secondary | ICD-10-CM | POA: Diagnosis not present

## 2023-11-06 NOTE — Telephone Encounter (Signed)
Returned call to April with [facility] and gave verbal order per PA Saralyn Pilar for physical therapy.

## 2023-11-06 NOTE — Telephone Encounter (Signed)
Absolutely, I am good with that!

## 2023-11-06 NOTE — Telephone Encounter (Signed)
Copied from CRM [id number]. Topic: Clinical - Home Health Verbal Orders >> Nov 06, 2023  9:44 AM Shelah Lewandowsky wrote: Caller/Agency: April with [facility] Callback Number: [phone] you can leave message Service Requested: Physical Therapy Frequency: 1 time a week for 4 weeks Any new concerns about the patient? No

## 2023-11-07 ENCOUNTER — Ambulatory Visit: Payer: Medicare Other | Attending: Cardiology

## 2023-11-07 DIAGNOSIS — Z5181 Encounter for therapeutic drug level monitoring: Secondary | ICD-10-CM | POA: Diagnosis not present

## 2023-11-07 DIAGNOSIS — I4891 Unspecified atrial fibrillation: Secondary | ICD-10-CM | POA: Diagnosis not present

## 2023-11-07 LAB — POCT INR: INR: 2.1 (ref 2.0–3.0)

## 2023-11-07 NOTE — Patient Instructions (Signed)
Description   Continue taking 1 tablet daily except 0.5 tablet on Sunday, Monday, Wednesday and Friday.  Stay consistent with greens (2 times per week)  Recheck INR 6 weeks  Coumadin Clinic [phone]

## 2023-11-10 ENCOUNTER — Other Ambulatory Visit: Payer: Self-pay | Admitting: Family Medicine

## 2023-11-10 DIAGNOSIS — J449 Chronic obstructive pulmonary disease, unspecified: Secondary | ICD-10-CM

## 2023-11-13 DIAGNOSIS — Z87440 Personal history of urinary (tract) infections: Secondary | ICD-10-CM | POA: Diagnosis not present

## 2023-11-13 DIAGNOSIS — Z7901 Long term (current) use of anticoagulants: Secondary | ICD-10-CM | POA: Diagnosis not present

## 2023-11-13 DIAGNOSIS — I080 Rheumatic disorders of both mitral and aortic valves: Secondary | ICD-10-CM | POA: Diagnosis not present

## 2023-11-13 DIAGNOSIS — Z792 Long term (current) use of antibiotics: Secondary | ICD-10-CM | POA: Diagnosis not present

## 2023-11-13 DIAGNOSIS — N400 Enlarged prostate without lower urinary tract symptoms: Secondary | ICD-10-CM | POA: Diagnosis not present

## 2023-11-13 DIAGNOSIS — I951 Orthostatic hypotension: Secondary | ICD-10-CM | POA: Diagnosis not present

## 2023-11-13 DIAGNOSIS — Z87891 Personal history of nicotine dependence: Secondary | ICD-10-CM | POA: Diagnosis not present

## 2023-11-13 DIAGNOSIS — Z952 Presence of prosthetic heart valve: Secondary | ICD-10-CM | POA: Diagnosis not present

## 2023-11-13 DIAGNOSIS — Z7951 Long term (current) use of inhaled steroids: Secondary | ICD-10-CM | POA: Diagnosis not present

## 2023-11-13 DIAGNOSIS — Z9181 History of falling: Secondary | ICD-10-CM | POA: Diagnosis not present

## 2023-11-13 DIAGNOSIS — Z85828 Personal history of other malignant neoplasm of skin: Secondary | ICD-10-CM | POA: Diagnosis not present

## 2023-11-13 DIAGNOSIS — I10 Essential (primary) hypertension: Secondary | ICD-10-CM | POA: Diagnosis not present

## 2023-11-13 DIAGNOSIS — I4821 Permanent atrial fibrillation: Secondary | ICD-10-CM | POA: Diagnosis not present

## 2023-11-13 DIAGNOSIS — Z556 Problems related to health literacy: Secondary | ICD-10-CM | POA: Diagnosis not present

## 2023-11-13 DIAGNOSIS — I712 Thoracic aortic aneurysm, without rupture, unspecified: Secondary | ICD-10-CM | POA: Diagnosis not present

## 2023-11-15 ENCOUNTER — Telehealth: Payer: Self-pay | Admitting: *Deleted

## 2023-11-15 NOTE — Telephone Encounter (Signed)
 LVM to call office to inform them of below.   Looks like they tried calling the patient on 11/10/2023, I have asked the CSR that is working this patient to reach back out.  Thank you.  The patient/family can also call for those updates at [phone].

## 2023-11-16 ENCOUNTER — Encounter: Payer: Self-pay | Admitting: Family Medicine

## 2023-11-22 DIAGNOSIS — I712 Thoracic aortic aneurysm, without rupture, unspecified: Secondary | ICD-10-CM | POA: Diagnosis not present

## 2023-11-22 DIAGNOSIS — Z85828 Personal history of other malignant neoplasm of skin: Secondary | ICD-10-CM | POA: Diagnosis not present

## 2023-11-22 DIAGNOSIS — Z87440 Personal history of urinary (tract) infections: Secondary | ICD-10-CM | POA: Diagnosis not present

## 2023-11-22 DIAGNOSIS — Z952 Presence of prosthetic heart valve: Secondary | ICD-10-CM | POA: Diagnosis not present

## 2023-11-22 DIAGNOSIS — Z792 Long term (current) use of antibiotics: Secondary | ICD-10-CM | POA: Diagnosis not present

## 2023-11-22 DIAGNOSIS — I080 Rheumatic disorders of both mitral and aortic valves: Secondary | ICD-10-CM | POA: Diagnosis not present

## 2023-11-22 DIAGNOSIS — Z9181 History of falling: Secondary | ICD-10-CM | POA: Diagnosis not present

## 2023-11-22 DIAGNOSIS — I4821 Permanent atrial fibrillation: Secondary | ICD-10-CM | POA: Diagnosis not present

## 2023-11-22 DIAGNOSIS — Z556 Problems related to health literacy: Secondary | ICD-10-CM | POA: Diagnosis not present

## 2023-11-22 DIAGNOSIS — I10 Essential (primary) hypertension: Secondary | ICD-10-CM | POA: Diagnosis not present

## 2023-11-22 DIAGNOSIS — I951 Orthostatic hypotension: Secondary | ICD-10-CM | POA: Diagnosis not present

## 2023-11-22 DIAGNOSIS — Z87891 Personal history of nicotine dependence: Secondary | ICD-10-CM | POA: Diagnosis not present

## 2023-11-22 DIAGNOSIS — N400 Enlarged prostate without lower urinary tract symptoms: Secondary | ICD-10-CM | POA: Diagnosis not present

## 2023-11-22 DIAGNOSIS — Z7901 Long term (current) use of anticoagulants: Secondary | ICD-10-CM | POA: Diagnosis not present

## 2023-11-22 DIAGNOSIS — Z7951 Long term (current) use of inhaled steroids: Secondary | ICD-10-CM | POA: Diagnosis not present

## 2023-11-22 NOTE — Progress Notes (Signed)
 [facility] - Clinic Note  12/05/2023     CHIEF COMPLAINT Patient presents for Retina Follow Up   HISTORY OF PRESENT ILLNESS: Warren Jafri. is a 89 y.o. male who presents to the clinic today for:   HPI     Retina Follow Up   Patient presents with  Wet AMD.  In right eye.  This started 8 weeks ago.  I, the attending physician,  performed the HPI with the patient and updated documentation appropriately.        Comments   Patient here for 8 weeks retina follow up for exu ARMD OD. Patient states vision about the same. No eye pain. Not using drops. OD tears. It always tears.      Last edited by Rennis Chris, MD on 12/05/2023  9:10 AM.    Pt states vision is stable  Referring physician: Melida Quitter, PA [address] [place],  [place] [address]  HISTORICAL INFORMATION:   Selected notes from the MEDICAL RECORD NUMBER [name] transferring care due to insurance LEE: 03.27.23 [BCVA 20/40 OD, CF OS] Ocular Hx- ex ARMD OU; last injection was IVA OS on 05.24.21  PMH-    CURRENT MEDICATIONS: No current outpatient medications on file. (Ophthalmic Drugs)   No current facility-administered medications for this visit. (Ophthalmic Drugs)   Current Outpatient Medications (Other)  Medication Sig   acetaminophen (TYLENOL) 500 MG tablet Take 1,000 mg by mouth every 6 (six) hours as needed for moderate pain or headache.   amoxicillin (AMOXIL) 500 MG tablet Take 4 tablets (2,000 mg total) by mouth as directed. 1 hour prior to dental work including cleanings   cetirizine (ZYRTEC) 10 MG tablet Take 1 tablet by mouth once daily   docusate sodium (COLACE) 100 MG capsule Take 100 mg by mouth daily.   finasteride (PROSCAR) 5 MG tablet Take 1 tablet by mouth once daily   furosemide (LASIX) 40 MG tablet Take 1 tablet by mouth once daily   memantine (NAMENDA) 10 MG tablet Take 1 tablet by mouth twice daily   Multiple Vitamins-Minerals (MULTIVITAMIN WITH  MINERALS) tablet Take 1 tablet by mouth daily.   Multiple Vitamins-Minerals (OCUVITE PO) Take 1 tablet by mouth daily.   OMEGA-3 FATTY ACIDS PO Take 500 mg by mouth daily.   sodium chloride (OCEAN) 0.65 % SOLN nasal spray Place 1 spray into both nostrils daily as needed for congestion.   TRELEGY ELLIPTA 100-62.5-25 MCG/ACT AEPB INHALE 1 PUFF ONCE DAILY   warfarin (COUMADIN) 5 MG tablet TAKE 1/2 TO 1 (ONE-HALF TO ONE) TABLET BY MOUTH ONCE DAILY AS DIRECTED BY ANTICOAGULATION CLINIC   metoprolol succinate (TOPROL-XL) 50 MG 24 hr tablet Take 0.5 tablets (25 mg total) by mouth daily. Take with or immediately following a meal. May split current pills in half until bottle is gone.   No current facility-administered medications for this visit. (Other)   REVIEW OF SYSTEMS: ROS   Positive for: Cardiovascular, Eyes Negative for: Constitutional, Gastrointestinal, Neurological, Skin, Genitourinary, Musculoskeletal, HENT, Endocrine, Respiratory, Psychiatric, Allergic/Imm, Heme/Lymph Last edited by Laddie Aquas, COA on 12/05/2023  8:11 AM.     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 Clifton James & Dr. Laneta Simmers   Severe aortic stenosis    Umbilical hernia 07/27/2012   Past Surgical History:  Procedure Laterality Date   CARDIOVERSION  10/10/2006   cataracts Bilateral    HERNIA REPAIR  09/10/2012   large umbilical hernia   INSERTION OF MESH  09/10/2012   Procedure: INSERTION OF MESH;  Surgeon: [name], MD;  Location: [facility] OR;  Service: General;  Laterality: N/A;   INTRAOPERATIVE TRANSTHORACIC ECHOCARDIOGRAM N/A 10/18/2022   Procedure: INTRAOPERATIVE  TRANSTHORACIC ECHOCARDIOGRAM;  Surgeon: Kathleene Hazel, MD;  Location: [facility] INVASIVE CV LAB;  Service: Open Heart Surgery;  Laterality: N/A;   macular degeneration Bilateral    RIGHT HEART CATH AND CORONARY ANGIOGRAPHY N/A 09/09/2022   Procedure: RIGHT HEART CATH AND CORONARY ANGIOGRAPHY;  Surgeon: Kathleene Hazel, MD;  Location: [facility] INVASIVE CV LAB;  Service: Cardiovascular;  Laterality: N/A;   TONSILLECTOMY     "maybe" (09/10/2012)   TRANSCATHETER AORTIC VALVE REPLACEMENT, TRANSFEMORAL N/A 10/18/2022   Procedure: Transcatheter Aortic Valve Replacement, Transfemoral;  Surgeon: Kathleene Hazel, MD;  Location: [facility] INVASIVE CV LAB;  Service: Open Heart Surgery;  Laterality: N/A;   UMBILICAL HERNIA REPAIR  09/10/2012   Procedure: HERNIA REPAIR UMBILICAL ADULT;  Surgeon: [name], MD;  Location: [facility] OR;  Service: General;  Laterality: N/A;   FAMILY HISTORY Family History  Problem Relation Age of Onset   Heart disease Mother    Stroke Father    SOCIAL HISTORY Social History   Tobacco Use   Smoking status: Former    Current packs/day: 0.00    Average packs/day: 1.5 packs/day for 10.0 years (15.0 ttl pk-yrs)    Types: Cigarettes    Start date: 07/27/1972    Quit date: 07/27/1982    Years since quitting: 41.3   Smokeless tobacco: Never  Vaping Use   Vaping status: Never Used  Substance Use Topics   Drug use: No       OPHTHALMIC EXAM:  Base Eye Exam     Visual Acuity (Snellen - Linear)       Right Left   Dist [place] 20/150 CF at 3'   Dist ph [place] 20/70 -2 NI         Tonometry (Tonopen, 8:09 AM)       Right Left   Pressure 14 13         Pupils       Dark Light Shape React APD   Right 2 1 Round Brisk None   Left 2 1 Round Brisk None         Visual Fields (Counting fingers)       Left Right    Full Full         Extraocular Movement       Right Left    Full, Ortho Full, Ortho         Neuro/Psych     Oriented x3: Yes    Mood/Affect: Normal         Dilation     Both eyes: 1.0% Mydriacyl, 2.5% Phenylephrine @ 8:09 AM           Slit Lamp and Fundus Exam     External Exam       Right Left   External Normal Normal         Slit Lamp Exam       Right  Left   Lids/Lashes Dermatochalasis - upper lid, Ptosis Dermatochalasis - upper lid, mild MGD   Conjunctiva/Sclera White and quiet White and quiet   Cornea 1-2+ Punctate epithelial erosions, mild arcus, well healed cataract wound, tear film debris Trace tear film debris, well healed cataract wound, arcus   Anterior Chamber deep and clear deep and clear   Iris Round and dilated round and poorly dilated   Lens PC IOL in good position, 1-2+ Posterior capsular opacification PC IOL in good position, 2-3+ Posterior capsular opacification   Anterior Vitreous mild syneresis Vitreous syneresis         Fundus Exam       Right Left   Posterior Vitreous Posterior vitreous detachment Posterior vitreous detachment, vitreous condensations   Disc 2+Pallor, Sharp rim, Compact 2+Pallor, Sharp rim   C/D Ratio 0.2 0.3   Macula Blunted foveal reflex, refractile drusen, RPE mottling, clumping and atrophy, focal central edema -- stably improved, no frank heme Flat, blunted foveal reflex, refractile drusen, RPE mottling, clumping and atrophy, central GA, no frank heme   Vessels attenuated, Tortuous attenuated, mild tortuosity   Periphery Attached, reticular degeneration, no heme Attached, reticlar degeneration, no heme           IMAGING AND PROCEDURES  Imaging and Procedures for 12/05/2023  OCT, Retina - OU - Both Eyes       Right Eye Quality was good. Scan locations included subfoveal. Central Foveal Thickness: 211. Progression has been stable. Findings include normal foveal contour, no IRF, retinal drusen , subretinal hyper-reflective material, pigment epithelial detachment, subretinal fluid, outer retinal atrophy (Patchy central ORA with PEDs, central  pocket of SRF -- persistent).   Left Eye Quality was poor. Scan locations included subfoveal. Central Foveal Thickness: 288. Progression has been stable. Findings include no IRF, no SRF, abnormal foveal contour, retinal drusen , outer retinal tubulation, pigment epithelial detachment, outer retinal atrophy (central ORA / GA with PEDs and ORT).   Notes *Images captured and stored on drive  Diagnosis / Impression:  OD: Patchy central ORA with PEDs, central pocket of SRF -- persistent OS: non exudative ARMD - central ORA / GA with PEDs and ORT  Clinical management:  See below  Abbreviations: NFP - Normal foveal profile. CME - cystoid macular edema. PED - pigment epithelial detachment. IRF - intraretinal fluid. SRF - subretinal fluid. EZ - ellipsoid zone. ERM - epiretinal membrane. ORA - outer retinal atrophy. ORT - outer retinal tubulation. SRHM - subretinal hyper-reflective material. IRHM - intraretinal hyper-reflective material      Intravitreal Injection, Pharmacologic Agent - OD - Right Eye       Time Out 12/05/2023. 8:54 AM. Confirmed correct patient, procedure, site, and patient consented.   Anesthesia Topical anesthesia was used. Anesthetic medications included Lidocaine 2%, Proparacaine 0.5%.   Procedure Preparation included 5% betadine to ocular surface, eyelid speculum. A (32g) needle was used.   Injection: 2 mg aflibercept 2 MG/0.05ML   Route: Intravitreal, Site: Right Eye   NDC: [id number], Lot: [id number], Expiration date: 11/09/2024, Waste: 0 mL   Post-op Post injection exam found visual acuity of at least counting fingers. The patient tolerated the procedure well. There were no complications. The patient received written and verbal post procedure care education.            ASSESSMENT/PLAN:    ICD-10-CM   1. Exudative age-related macular degeneration of right eye with active choroidal neovascularization (HCC)  H35.3211 OCT, Retina - OU - Both Eyes  Intravitreal Injection, Pharmacologic Agent - OD - Right Eye    aflibercept (EYLEA) SOLN 2 mg    2. 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 Essential hypertension  I10     4. Hypertensive retinopathy of both eyes  H35.033     5. Pseudophakia, both eyes  Z96.1      Exudative age related macular degeneration, OD  - previous Dr. Luciana Axe pt here due to insurance  - pt reports history of injections OD w/ [name], but unable to see record of that - here s/p IVA OD #1 (02.07.24), #2 (03.13.24), #3 (04.17.24), #4 (05.31.24) -- IVA resistance  - s/p IVE OD #1 (06.28.24), #2 (07.29.24), #3 (08.26.24), #4 (09.30.24), #5 (11.11.24), #6 (12.30.24)  - BCVA OD stable at 20/70  - OCT OD: Patchy central ORA with PEDs --, central pocket of SRF -- persistent at 8 wks  - recommend IVE OD #7 today, 02.25.25 w/ f/u up extended to 9 wks  - Good Days unavailable -- pt covering 20% coinsurance for medication - pt wishes to proceed with injection - RBA of procedure discussed, questions answered - IVE informed consent obtained and signed, 06.28.24 (OD) - see procedure note   - f/u in 9 wks -- DFE/OCT, possible injection  2. Age related macular degeneration, non-exudative, left eye  - advanced stage w/ significant central GA  - BCVA CF 3' -- stable   - history of IVA OS w/ [name], last 05.24.21  - OCT OS shows Central ORA / GA with PEDs and ORT  - Recommend amsler grid monitoring  - monitor  3,4. Hypertensive retinopathy OU - discussed importance of tight BP control - monitor  5. Pseudophakia OU  - s/p CE/IOL (Dr. Nile Riggs)  - IOL in good position, doing well  - monitor  Ophthalmic Meds Ordered this visit:  Meds ordered this encounter  Medications   aflibercept (EYLEA) SOLN 2 mg     Return in about 9 weeks (around 02/06/2024) for f/u exu ARMD OD, DFE, OCT, Possible Injxn.  There are no Patient Instructions on file for  this visit.  This document serves as a record of services personally performed by Karie Chimera, MD, PhD. It was created on their behalf by Charlette Caffey, COT an ophthalmic technician. The creation of this record is the provider's dictation and/or activities during the visit.    Electronically signed by:  Charlette Caffey, COT  12/05/23 10:47 PM  This document serves as a record of services personally performed by Karie Chimera, MD, PhD. It was created on their behalf by Glee Arvin. Manson Passey, OA an ophthalmic technician. The creation of this record is the provider's dictation and/or activities during the visit.    Electronically signed by: Glee Arvin. Manson Passey, OA 12/05/23 10:47 PM  Karie Chimera, M.D., Ph.D. Diseases & Surgery of the Retina and Vitreous [facility]  I have reviewed the above documentation for accuracy and completeness, and I agree with the above. Karie Chimera, M.D., Ph.D. 12/05/23 10:52 PM   Abbreviations: M myopia (nearsighted); A astigmatism; H hyperopia (farsighted); P presbyopia; Mrx spectacle prescription;  CTL contact lenses; OD right eye; OS left eye; OU both eyes  XT exotropia; ET esotropia; PEK punctate epithelial keratitis; PEE punctate epithelial erosions; DES dry eye syndrome; MGD meibomian gland dysfunction; ATs artificial tears; PFAT's preservative free artificial tears; NSC nuclear sclerotic cataract; PSC posterior subcapsular  cataract; ERM epi-retinal membrane; PVD posterior vitreous detachment; RD retinal detachment; DM diabetes mellitus; DR diabetic retinopathy; NPDR non-proliferative diabetic retinopathy; PDR proliferative diabetic retinopathy; CSME clinically significant macular edema; DME diabetic macular edema; dbh dot blot hemorrhages; CWS cotton wool spot; POAG primary open angle glaucoma; C/D cup-to-disc ratio; HVF humphrey visual field; GVF goldmann visual field; OCT optical coherence tomography; IOP intraocular pressure;  BRVO Branch retinal vein occlusion; CRVO central retinal vein occlusion; CRAO central retinal artery occlusion; BRAO branch retinal artery occlusion; RT retinal tear; SB scleral buckle; PPV pars plana vitrectomy; VH Vitreous hemorrhage; PRP panretinal laser photocoagulation; IVK intravitreal kenalog; VMT vitreomacular traction; MH Macular hole;  NVD neovascularization of the disc; NVE neovascularization elsewhere; AREDS age related eye disease study; ARMD age related macular degeneration; POAG primary open angle glaucoma; EBMD epithelial/anterior basement membrane dystrophy; ACIOL anterior chamber intraocular lens; IOL intraocular lens; PCIOL posterior chamber intraocular lens; Phaco/IOL phacoemulsification with intraocular lens placement; PRK photorefractive keratectomy; LASIK laser assisted in situ keratomileusis; HTN hypertension; DM diabetes mellitus; COPD chronic obstructive pulmonary disease

## 2023-11-23 ENCOUNTER — Other Ambulatory Visit: Payer: Self-pay

## 2023-11-23 DIAGNOSIS — I10 Essential (primary) hypertension: Secondary | ICD-10-CM

## 2023-11-23 DIAGNOSIS — I4821 Permanent atrial fibrillation: Secondary | ICD-10-CM

## 2023-11-23 DIAGNOSIS — Z136 Encounter for screening for cardiovascular disorders: Secondary | ICD-10-CM

## 2023-11-27 ENCOUNTER — Other Ambulatory Visit: Payer: Self-pay | Admitting: Cardiology

## 2023-11-27 ENCOUNTER — Other Ambulatory Visit: Payer: Self-pay | Admitting: Family Medicine

## 2023-11-27 DIAGNOSIS — Z87891 Personal history of nicotine dependence: Secondary | ICD-10-CM | POA: Diagnosis not present

## 2023-11-27 DIAGNOSIS — Z7951 Long term (current) use of inhaled steroids: Secondary | ICD-10-CM | POA: Diagnosis not present

## 2023-11-27 DIAGNOSIS — Z556 Problems related to health literacy: Secondary | ICD-10-CM | POA: Diagnosis not present

## 2023-11-27 DIAGNOSIS — Z952 Presence of prosthetic heart valve: Secondary | ICD-10-CM | POA: Diagnosis not present

## 2023-11-27 DIAGNOSIS — Z87440 Personal history of urinary (tract) infections: Secondary | ICD-10-CM | POA: Diagnosis not present

## 2023-11-27 DIAGNOSIS — N400 Enlarged prostate without lower urinary tract symptoms: Secondary | ICD-10-CM | POA: Diagnosis not present

## 2023-11-27 DIAGNOSIS — I951 Orthostatic hypotension: Secondary | ICD-10-CM | POA: Diagnosis not present

## 2023-11-27 DIAGNOSIS — Z7901 Long term (current) use of anticoagulants: Secondary | ICD-10-CM | POA: Diagnosis not present

## 2023-11-27 DIAGNOSIS — I080 Rheumatic disorders of both mitral and aortic valves: Secondary | ICD-10-CM | POA: Diagnosis not present

## 2023-11-27 DIAGNOSIS — Z9181 History of falling: Secondary | ICD-10-CM | POA: Diagnosis not present

## 2023-11-27 DIAGNOSIS — I10 Essential (primary) hypertension: Secondary | ICD-10-CM | POA: Diagnosis not present

## 2023-11-27 DIAGNOSIS — I4821 Permanent atrial fibrillation: Secondary | ICD-10-CM | POA: Diagnosis not present

## 2023-11-27 DIAGNOSIS — Z85828 Personal history of other malignant neoplasm of skin: Secondary | ICD-10-CM | POA: Diagnosis not present

## 2023-11-27 DIAGNOSIS — I712 Thoracic aortic aneurysm, without rupture, unspecified: Secondary | ICD-10-CM | POA: Diagnosis not present

## 2023-11-27 DIAGNOSIS — Z792 Long term (current) use of antibiotics: Secondary | ICD-10-CM | POA: Diagnosis not present

## 2023-11-28 ENCOUNTER — Encounter: Payer: Self-pay | Admitting: Family Medicine

## 2023-11-29 DIAGNOSIS — J449 Chronic obstructive pulmonary disease, unspecified: Secondary | ICD-10-CM | POA: Diagnosis not present

## 2023-11-29 DIAGNOSIS — J3089 Other allergic rhinitis: Secondary | ICD-10-CM | POA: Diagnosis not present

## 2023-11-29 DIAGNOSIS — I4821 Permanent atrial fibrillation: Secondary | ICD-10-CM | POA: Diagnosis not present

## 2023-12-04 ENCOUNTER — Other Ambulatory Visit: Payer: Medicare Other

## 2023-12-04 DIAGNOSIS — I10 Essential (primary) hypertension: Secondary | ICD-10-CM | POA: Diagnosis not present

## 2023-12-04 DIAGNOSIS — I4821 Permanent atrial fibrillation: Secondary | ICD-10-CM

## 2023-12-04 DIAGNOSIS — Z136 Encounter for screening for cardiovascular disorders: Secondary | ICD-10-CM

## 2023-12-05 ENCOUNTER — Encounter (INDEPENDENT_AMBULATORY_CARE_PROVIDER_SITE_OTHER): Payer: Self-pay | Admitting: Ophthalmology

## 2023-12-05 ENCOUNTER — Encounter: Payer: Self-pay | Admitting: Family Medicine

## 2023-12-05 ENCOUNTER — Ambulatory Visit (INDEPENDENT_AMBULATORY_CARE_PROVIDER_SITE_OTHER): Payer: Medicare Other | Admitting: Ophthalmology

## 2023-12-05 DIAGNOSIS — H353211 Exudative age-related macular degeneration, right eye, with active choroidal neovascularization: Secondary | ICD-10-CM | POA: Diagnosis not present

## 2023-12-05 DIAGNOSIS — H353124 Nonexudative age-related macular degeneration, left eye, advanced atrophic with subfoveal involvement: Secondary | ICD-10-CM

## 2023-12-05 DIAGNOSIS — Z961 Presence of intraocular lens: Secondary | ICD-10-CM

## 2023-12-05 DIAGNOSIS — I10 Essential (primary) hypertension: Secondary | ICD-10-CM | POA: Diagnosis not present

## 2023-12-05 DIAGNOSIS — H35033 Hypertensive retinopathy, bilateral: Secondary | ICD-10-CM

## 2023-12-05 LAB — CBC WITH DIFFERENTIAL/PLATELET
Basophils Absolute: 0.1 10*3/uL (ref 0.0–0.2)
Basos: 1 %
EOS (ABSOLUTE): 0.2 10*3/uL (ref 0.0–0.4)
Eos: 3 %
Hematocrit: 48.2 % (ref 37.5–51.0)
Hemoglobin: 16.2 g/dL (ref 13.0–17.7)
Immature Grans (Abs): 0 10*3/uL (ref 0.0–0.1)
Immature Granulocytes: 1 %
Lymphocytes Absolute: 1.4 10*3/uL (ref 0.7–3.1)
Lymphs: 18 %
MCH: 30.1 pg (ref 26.6–33.0)
MCHC: 33.6 g/dL (ref 31.5–35.7)
MCV: 89 fL (ref 79–97)
Monocytes Absolute: 0.6 10*3/uL (ref 0.1–0.9)
Monocytes: 8 %
Neutrophils Absolute: 5.5 10*3/uL (ref 1.4–7.0)
Neutrophils: 69 %
Platelets: 153 10*3/uL (ref 150–450)
RBC: 5.39 x10E6/uL (ref 4.14–5.80)
RDW: 12.6 % (ref 11.6–15.4)
WBC: 7.8 10*3/uL (ref 3.4–10.8)

## 2023-12-05 LAB — COMPREHENSIVE METABOLIC PANEL
ALT: 20 [IU]/L (ref 0–44)
AST: 35 [IU]/L (ref 0–40)
Albumin: 4.1 g/dL (ref 3.7–4.7)
Alkaline Phosphatase: 103 [IU]/L (ref 44–121)
BUN/Creatinine Ratio: 16 (ref 10–24)
BUN: 21 mg/dL (ref 8–27)
Bilirubin Total: 0.9 mg/dL (ref 0.0–1.2)
CO2: 25 mmol/L (ref 20–29)
Calcium: 9.4 mg/dL (ref 8.6–10.2)
Chloride: 103 mmol/L (ref 96–106)
Creatinine, Ser: 1.3 mg/dL — ABNORMAL HIGH (ref 0.76–1.27)
Globulin, Total: 2.4 g/dL (ref 1.5–4.5)
Glucose: 81 mg/dL (ref 70–99)
Potassium: 4.2 mmol/L (ref 3.5–5.2)
Sodium: 144 mmol/L (ref 134–144)
Total Protein: 6.5 g/dL (ref 6.0–8.5)
eGFR: 54 mL/min/{1.73_m2} — ABNORMAL LOW (ref >59)

## 2023-12-05 LAB — LIPID PANEL
Chol/HDL Ratio: 3.7 {ratio} (ref 0.0–5.0)
Cholesterol, Total: 216 mg/dL — ABNORMAL HIGH (ref 100–199)
HDL: 59 mg/dL (ref >39)
LDL Chol Calc (NIH): 142 mg/dL — ABNORMAL HIGH (ref 0–99)
Triglycerides: 82 mg/dL (ref 0–149)
VLDL Cholesterol Cal: 15 mg/dL (ref 5–40)

## 2023-12-05 MED ORDER — AFLIBERCEPT 2MG/0.05ML IZ SOLN FOR KALEIDOSCOPE
2.0000 mg | INTRAVITREAL | Status: AC | PRN
Start: 2023-12-05 — End: 2023-12-05
  Administered 2023-12-05: 2 mg via INTRAVITREAL

## 2023-12-06 DIAGNOSIS — I080 Rheumatic disorders of both mitral and aortic valves: Secondary | ICD-10-CM | POA: Diagnosis not present

## 2023-12-06 DIAGNOSIS — Z87440 Personal history of urinary (tract) infections: Secondary | ICD-10-CM | POA: Diagnosis not present

## 2023-12-06 DIAGNOSIS — Z556 Problems related to health literacy: Secondary | ICD-10-CM | POA: Diagnosis not present

## 2023-12-06 DIAGNOSIS — I951 Orthostatic hypotension: Secondary | ICD-10-CM | POA: Diagnosis not present

## 2023-12-06 DIAGNOSIS — Z792 Long term (current) use of antibiotics: Secondary | ICD-10-CM | POA: Diagnosis not present

## 2023-12-06 DIAGNOSIS — N400 Enlarged prostate without lower urinary tract symptoms: Secondary | ICD-10-CM | POA: Diagnosis not present

## 2023-12-06 DIAGNOSIS — Z85828 Personal history of other malignant neoplasm of skin: Secondary | ICD-10-CM | POA: Diagnosis not present

## 2023-12-06 DIAGNOSIS — Z9181 History of falling: Secondary | ICD-10-CM | POA: Diagnosis not present

## 2023-12-06 DIAGNOSIS — I10 Essential (primary) hypertension: Secondary | ICD-10-CM | POA: Diagnosis not present

## 2023-12-06 DIAGNOSIS — Z7951 Long term (current) use of inhaled steroids: Secondary | ICD-10-CM | POA: Diagnosis not present

## 2023-12-06 DIAGNOSIS — I4821 Permanent atrial fibrillation: Secondary | ICD-10-CM | POA: Diagnosis not present

## 2023-12-06 DIAGNOSIS — I712 Thoracic aortic aneurysm, without rupture, unspecified: Secondary | ICD-10-CM | POA: Diagnosis not present

## 2023-12-06 DIAGNOSIS — Z87891 Personal history of nicotine dependence: Secondary | ICD-10-CM | POA: Diagnosis not present

## 2023-12-06 DIAGNOSIS — Z952 Presence of prosthetic heart valve: Secondary | ICD-10-CM | POA: Diagnosis not present

## 2023-12-06 DIAGNOSIS — Z7901 Long term (current) use of anticoagulants: Secondary | ICD-10-CM | POA: Diagnosis not present

## 2023-12-11 ENCOUNTER — Ambulatory Visit (INDEPENDENT_AMBULATORY_CARE_PROVIDER_SITE_OTHER): Payer: Medicare Other | Admitting: Family Medicine

## 2023-12-11 VITALS — BP 131/79 | HR 61 | Ht 72.0 in | Wt 216.8 lb

## 2023-12-11 DIAGNOSIS — E7800 Pure hypercholesterolemia, unspecified: Secondary | ICD-10-CM | POA: Diagnosis not present

## 2023-12-11 DIAGNOSIS — J449 Chronic obstructive pulmonary disease, unspecified: Secondary | ICD-10-CM | POA: Diagnosis not present

## 2023-12-11 DIAGNOSIS — I10 Essential (primary) hypertension: Secondary | ICD-10-CM

## 2023-12-11 MED ORDER — TRELEGY ELLIPTA 100-62.5-25 MCG/ACT IN AEPB: INHALATION_SPRAY | RESPIRATORY_TRACT | 1 refills | Status: DC

## 2023-12-11 MED ORDER — ALBUTEROL SULFATE HFA 108 (90 BASE) MCG/ACT IN AERS: 2.0000 | INHALATION_SPRAY | Freq: Four times a day (QID) | RESPIRATORY_TRACT | 11 refills | Status: AC | PRN

## 2023-12-11 NOTE — Patient Instructions (Addendum)
 Your echocardiogram was stable which is reassuring.  Continue using your Trelegy inhaler daily for maintenance.  I also sent an albuterol inhaler to use as rescue medication.  Continue monitoring your symptoms.  If they do worsen in the next couple of months, please let our office know and we will send a referral to pulmonology.  Otherwise, plan to continue following up with our office as well as cardiology as scheduled.

## 2023-12-11 NOTE — Assessment & Plan Note (Signed)
 Last lipid panel: LDL 142, HDL 59, triglycerides 82.  Fairly stable. Will continue to monitor.

## 2023-12-11 NOTE — Assessment & Plan Note (Addendum)
 Continue Trelegy Ellipta 100-60 2.5-25 mcg/act 1 puff daily.  Add albuterol inhaler as needed for wheezing or shortness of breath.  If there is no improvement and cardiology does not believe that potential heart failure may be contributing, consider referral to pulmonology.  Will continue to monitor.

## 2023-12-11 NOTE — Assessment & Plan Note (Signed)
 Stable.  Continue metoprolol 50 mg daily and ambulatory blood pressure monitoring.  CMP demonstrated creatinine 1.30 and GFR 54, decline in kidney function compared to last check.  Encouraged to maintain adequate hydration status.  Will continue to monitor.

## 2023-12-11 NOTE — Progress Notes (Signed)
 Established Patient Office Visit  Subjective   Patient ID: Warren Quevedo., male    DOB: Feb 23, 1935  Age: 89 y.o. MRN: [id number]  Chief Complaint  Patient presents with   Hypertension   COPD    HPI Warren Tietje. is a 89 y.o. male presenting today for follow up of hypertension, COPD. Hypertension: He is also followed by cardiology.  Pt denies chest pain, SOB, dizziness, edema, syncope, fatigue or heart palpitations. Taking metoprolol succinate 50 mg, reports excellent compliance with treatment. Denies side effects. COPD: Currently using uses Trelegy Ellipta for maintenance, currently does not have script for rescue inhaler. He reports excellent compliance with treatment. He is not having side effects. He endorses fairly constant dyspnea, stating it is difficult to get enough air.  His daughter with him here today states that in their discussions about this at home, he has also mentioned that this is in comparison to "his lung capacity at 52".  Denies cough, wheezing, fatigue, chills, fever, increased sputum, colored sputum, or weight changes.   Outpatient Medications Prior to Visit  Medication Sig   acetaminophen (TYLENOL) 500 MG tablet Take 1,000 mg by mouth every 6 (six) hours as needed for moderate pain or headache.   cetirizine (ZYRTEC) 10 MG tablet Take 1 tablet by mouth once daily   finasteride (PROSCAR) 5 MG tablet Take 1 tablet by mouth once daily   furosemide (LASIX) 40 MG tablet Take 1 tablet by mouth once daily   memantine (NAMENDA) 10 MG tablet Take 1 tablet by mouth twice daily   Multiple Vitamins-Minerals (MULTIVITAMIN WITH MINERALS) tablet Take 1 tablet by mouth daily.   Multiple Vitamins-Minerals (OCUVITE PO) Take 1 tablet by mouth daily.   OMEGA-3 FATTY ACIDS PO Take 500 mg by mouth daily.   sodium chloride (OCEAN) 0.65 % SOLN nasal spray Place 1 spray into both nostrils daily as needed for congestion.   warfarin (COUMADIN) 5 MG tablet TAKE 1/2 TO 1  (ONE-HALF TO ONE) TABLET BY MOUTH ONCE DAILY AS DIRECTED BY ANTICOAGULATION CLINIC   [DISCONTINUED] amoxicillin (AMOXIL) 500 MG tablet Take 4 tablets (2,000 mg total) by mouth as directed. 1 hour prior to dental work including cleanings   [DISCONTINUED] TRELEGY ELLIPTA 100-62.5-25 MCG/ACT AEPB INHALE 1 PUFF ONCE DAILY   metoprolol succinate (TOPROL-XL) 50 MG 24 hr tablet Take 0.5 tablets (25 mg total) by mouth daily. Take with or immediately following a meal. May split current pills in half until bottle is gone.   [DISCONTINUED] docusate sodium (COLACE) 100 MG capsule Take 100 mg by mouth daily. (Patient not taking: Reported on 12/11/2023)   No facility-administered medications prior to visit.    ROS Negative unless otherwise noted in HPI   Objective:     BP 131/79   Pulse 61   Ht 6' (1.829 m)   Wt 216 lb 12 oz (98.3 kg)   SpO2 95%   BMI 29.40 kg/m   Physical Exam Constitutional:      General: He is not in acute distress.    Appearance: Normal appearance.  HENT:     Head: Normocephalic and atraumatic.  Cardiovascular:     Rate and Rhythm: Normal rate and regular rhythm.     Heart sounds: Normal heart sounds. No murmur heard.    No friction rub. No gallop.  Pulmonary:     Effort: Pulmonary effort is normal. No respiratory distress.     Breath sounds: Normal breath sounds. No wheezing, rhonchi or rales.  Musculoskeletal:     Right lower leg: Edema present.     Left lower leg: Edema present.  Skin:    General: Skin is warm and dry.  Neurological:     Mental Status: He is alert and oriented to person, place, and time.  Psychiatric:        Mood and Affect: Mood normal.      Assessment & Plan:  Essential hypertension Assessment & Plan: Stable.  Continue metoprolol 50 mg daily and ambulatory blood pressure monitoring.  CMP demonstrated creatinine 1.30 and GFR 54, decline in kidney function compared to last check.  Encouraged to maintain adequate hydration status.  Will  continue to monitor.   Chronic obstructive pulmonary disease, unspecified COPD type (HCC) Assessment & Plan: Continue Trelegy Ellipta 100-60 2.5-25 mcg/act 1 puff daily.  Add albuterol inhaler as needed for wheezing or shortness of breath.  If there is no improvement and cardiology does not believe that potential heart failure may be contributing, consider referral to pulmonology.  Will continue to monitor.  Orders: -     Trelegy Ellipta; INHALE 1 PUFF ONCE DAILY  Dispense: 60 each; Refill: 1 -     Albuterol Sulfate HFA; Inhale 2 puffs into the lungs every 6 (six) hours as needed for wheezing or shortness of breath.  Dispense: 2 each; Refill: 11  Pure hypercholesterolemia Assessment & Plan: Last lipid panel: LDL 142, HDL 59, triglycerides 82.  Fairly stable. Will continue to monitor.     Return in about 4 months (around 04/11/2024) for follow-up for COPD.    Melida Quitter, PA

## 2023-12-12 DIAGNOSIS — Z9181 History of falling: Secondary | ICD-10-CM | POA: Diagnosis not present

## 2023-12-12 DIAGNOSIS — I951 Orthostatic hypotension: Secondary | ICD-10-CM | POA: Diagnosis not present

## 2023-12-12 DIAGNOSIS — Z87891 Personal history of nicotine dependence: Secondary | ICD-10-CM | POA: Diagnosis not present

## 2023-12-12 DIAGNOSIS — Z87440 Personal history of urinary (tract) infections: Secondary | ICD-10-CM | POA: Diagnosis not present

## 2023-12-12 DIAGNOSIS — I10 Essential (primary) hypertension: Secondary | ICD-10-CM | POA: Diagnosis not present

## 2023-12-12 DIAGNOSIS — Z7951 Long term (current) use of inhaled steroids: Secondary | ICD-10-CM | POA: Diagnosis not present

## 2023-12-12 DIAGNOSIS — I712 Thoracic aortic aneurysm, without rupture, unspecified: Secondary | ICD-10-CM | POA: Diagnosis not present

## 2023-12-12 DIAGNOSIS — N400 Enlarged prostate without lower urinary tract symptoms: Secondary | ICD-10-CM | POA: Diagnosis not present

## 2023-12-12 DIAGNOSIS — I080 Rheumatic disorders of both mitral and aortic valves: Secondary | ICD-10-CM | POA: Diagnosis not present

## 2023-12-12 DIAGNOSIS — Z7901 Long term (current) use of anticoagulants: Secondary | ICD-10-CM | POA: Diagnosis not present

## 2023-12-12 DIAGNOSIS — Z85828 Personal history of other malignant neoplasm of skin: Secondary | ICD-10-CM | POA: Diagnosis not present

## 2023-12-12 DIAGNOSIS — I4821 Permanent atrial fibrillation: Secondary | ICD-10-CM | POA: Diagnosis not present

## 2023-12-12 DIAGNOSIS — Z556 Problems related to health literacy: Secondary | ICD-10-CM | POA: Diagnosis not present

## 2023-12-12 DIAGNOSIS — Z792 Long term (current) use of antibiotics: Secondary | ICD-10-CM | POA: Diagnosis not present

## 2023-12-12 DIAGNOSIS — Z952 Presence of prosthetic heart valve: Secondary | ICD-10-CM | POA: Diagnosis not present

## 2023-12-14 ENCOUNTER — Ambulatory Visit: Payer: Medicare Other

## 2023-12-14 DIAGNOSIS — Z0000 Encounter for general adult medical examination without abnormal findings: Secondary | ICD-10-CM

## 2023-12-14 NOTE — Patient Instructions (Signed)
 Mr. Warren Wagner , Thank you for taking time to come for your Medicare Wellness Visit. I appreciate your ongoing commitment to your health goals. Please review the following plan we discussed and let me know if I can assist you in the future.   Referrals/Orders/Follow-Ups/Clinician Recommendations: none  This is a list of the screening recommended for you and due dates:  Health Maintenance  Topic Date Due   Medicare Annual Wellness Visit  12/13/2024   DTaP/Tdap/Td vaccine (2 - Td or Tdap) 10/08/2031   Pneumonia Vaccine  Completed   Flu Shot  Completed   COVID-19 Vaccine  Completed   Zoster (Shingles) Vaccine  Completed   HPV Vaccine  Aged Out    Advanced directives: (In Chart) A copy of your advanced directives are scanned into your chart should your provider ever need it.  Next Medicare Annual Wellness Visit scheduled for next year: Yes  insert Preventive Care attachment Insert FALL PREVENTION attachment if needed

## 2023-12-14 NOTE — Progress Notes (Signed)
 Subjective:   Warren Wagner. is a 89 y.o. who presents for a Medicare Wellness preventive visit.  Visit Complete: Virtual I connected with  Warren Wagner. on 12/14/23 by a video and audio enabled telemedicine application and verified that I am speaking with the correct person using two identifiers.  Patient Location: Home  Provider Location: Home Office  I discussed the limitations of evaluation and management by telemedicine. The patient expressed understanding and agreed to proceed.  Vital Signs: Because this visit was a virtual/telehealth visit, some criteria may be missing or patient reported. Any vitals not documented were not able to be obtained and vitals that have been documented are patient reported.    AWV Questionnaire: No: Patient Medicare AWV questionnaire was not completed prior to this visit.  Cardiac Risk Factors include: advanced age (>50men, >14 women);hypertension;male gender     Objective:    Today's Vitals   There is no height or weight on file to calculate BMI.     12/14/2023    8:13 AM 11/29/2022    1:39 PM 09/09/2022    8:34 AM 02/03/2022    6:15 AM 02/02/2022    3:48 PM 11/02/2019    4:57 PM 08/23/2014   12:34 PM  Advanced Directives  Does Patient Have a Medical Advance Directive? Yes Yes Yes No No No No  Type of Estate agent of [place];Living will Healthcare Power of [employer] Power of Attorney      Does patient want to make changes to medical advance directive?  No - Patient declined No - Patient declined      Copy of Healthcare Power of Attorney in Chart? Yes - validated most recent copy scanned in chart (See row information) No - copy requested       Would patient like information on creating a medical advance directive?    Yes (Inpatient - patient requests chaplain consult to create a medical advance directive)  No - Patient declined No - patient declined information    Current Medications  (verified) Outpatient Encounter Medications as of 12/14/2023  Medication Sig   acetaminophen (TYLENOL) 500 MG tablet Take 1,000 mg by mouth every 6 (six) hours as needed for moderate pain or headache.   albuterol (VENTOLIN HFA) 108 (90 Base) MCG/ACT inhaler Inhale 2 puffs into the lungs every 6 (six) hours as needed for wheezing or shortness of breath.   cetirizine (ZYRTEC) 10 MG tablet Take 1 tablet by mouth once daily   finasteride (PROSCAR) 5 MG tablet Take 1 tablet by mouth once daily   Fluticasone-Umeclidin-Vilant (TRELEGY ELLIPTA) 100-62.5-25 MCG/ACT AEPB INHALE 1 PUFF ONCE DAILY   furosemide (LASIX) 40 MG tablet Take 1 tablet by mouth once daily   memantine (NAMENDA) 10 MG tablet Take 1 tablet by mouth twice daily   Multiple Vitamins-Minerals (MULTIVITAMIN WITH MINERALS) tablet Take 1 tablet by mouth daily.   Multiple Vitamins-Minerals (OCUVITE PO) Take 1 tablet by mouth daily.   OMEGA-3 FATTY ACIDS PO Take 500 mg by mouth daily.   sodium chloride (OCEAN) 0.65 % SOLN nasal spray Place 1 spray into both nostrils daily as needed for congestion.   warfarin (COUMADIN) 5 MG tablet TAKE 1/2 TO 1 (ONE-HALF TO ONE) TABLET BY MOUTH ONCE DAILY AS DIRECTED BY ANTICOAGULATION CLINIC   metoprolol succinate (TOPROL-XL) 50 MG 24 hr tablet Take 0.5 tablets (25 mg total) by mouth daily. Take with or immediately following a meal. May split current pills in half until bottle  is gone.   No facility-administered encounter medications on file as of 12/14/2023.    Allergies (verified) Patient has no known allergies.   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 Clifton James & Dr. Laneta Simmers   Severe aortic stenosis     Umbilical hernia 07/27/2012   Past Surgical History:  Procedure Laterality Date   CARDIOVERSION  10/10/2006   cataracts Bilateral    HERNIA REPAIR  09/10/2012   large umbilical hernia   INSERTION OF MESH  09/10/2012   Procedure: INSERTION OF MESH;  Surgeon: [name], MD;  Location: [facility];  Service: General;  Laterality: N/A;   INTRAOPERATIVE TRANSTHORACIC ECHOCARDIOGRAM N/A 10/18/2022   Procedure: INTRAOPERATIVE TRANSTHORACIC ECHOCARDIOGRAM;  Surgeon: Kathleene Hazel, MD;  Location: [facility] INVASIVE CV LAB;  Service: Open Heart Surgery;  Laterality: N/A;   macular degeneration Bilateral    RIGHT HEART CATH AND CORONARY ANGIOGRAPHY N/A 09/09/2022   Procedure: RIGHT HEART CATH AND CORONARY ANGIOGRAPHY;  Surgeon: Kathleene Hazel, MD;  Location: [facility] INVASIVE CV LAB;  Service: Cardiovascular;  Laterality: N/A;   TONSILLECTOMY     "maybe" (09/10/2012)   TRANSCATHETER AORTIC VALVE REPLACEMENT, TRANSFEMORAL N/A 10/18/2022   Procedure: Transcatheter Aortic Valve Replacement, Transfemoral;  Surgeon: Kathleene Hazel, MD;  Location: [facility] INVASIVE CV LAB;  Service: Open Heart Surgery;  Laterality: N/A;   UMBILICAL HERNIA REPAIR  09/10/2012   Procedure: HERNIA REPAIR UMBILICAL ADULT;  Surgeon: [name], MD;  Location: [facility] OR;  Service: General;  Laterality: N/A;   Family History  Problem Relation Age of Onset   Heart disease Mother    Stroke Father    Social History   Socioeconomic History   Marital status: Widowed    Spouse name: Not on file   Number of children: 3   Years of education: Not on file   Highest education level: Bachelor's degree (e.g., BA, AB, BS)  Occupational History   Occupation: Naval architect  Tobacco Use   Smoking status: Former    Current packs/day: 0.00    Average packs/day: 1.5 packs/day for 10.0 years (15.0 ttl pk-yrs)    Types: Cigarettes    Start date: 07/27/1972    Quit date: 07/27/1982    Years since quitting: 41.4   Smokeless  tobacco: Never  Vaping Use   Vaping status: Never Used  Substance and Sexual Activity   Alcohol use: Not Currently   Drug use: No   Sexual activity: Not Currently    Birth control/protection: None  Other Topics Concern   Not on file  Social History Narrative   Lives with 2 daughters. Three children.    Social Drivers of Corporate investment banker Strain: Low Risk  (12/14/2023)   Overall Financial Resource Strain (CARDIA)    Difficulty of Paying Living Expenses: Not hard at all  Food Insecurity: No Food Insecurity (12/14/2023)   Hunger Vital Sign    Worried About Running Out of Food in the Last Year: Never true    Ran Out of Food in the Last Year: Never true  Transportation Needs: No Transportation Needs (12/14/2023)   PRAPARE - Transportation    Lack  of Transportation (Medical): No    Lack of Transportation (Non-Medical): No  Physical Activity: Inactive (12/14/2023)   Exercise Vital Sign    Days of Exercise per Week: 0 days    Minutes of Exercise per Session: 0 min  Stress: No Stress Concern Present (12/14/2023)   [employer] of Occupational Health - Occupational Stress Questionnaire    Feeling of Stress : Not at all  Social Connections: Socially Isolated (12/14/2023)   Social Connection and Isolation Panel [NHANES]    Frequency of Communication with Friends and Family: Never    Frequency of Social Gatherings with Friends and Family: More than three times a week    Attends Religious Services: Never    Database administrator or Organizations: No    Attends Banker Meetings: Never    Marital Status: Widowed    Tobacco Counseling Counseling given: Not Answered    Clinical Intake:  Pre-visit preparation completed: Yes  Pain : No/denies pain     Nutritional Risks: None Diabetes: No  How often do you need to have someone help you when you read instructions, pamphlets, or other written materials from your doctor or pharmacy?: 1 - Never  Interpreter  Needed?: No  Information entered by :: NAllen LPN   Activities of Daily Living     12/14/2023    8:05 AM  In your present state of health, do you have any difficulty performing the following activities:  Hearing? 1  Comment has hearing aids  Vision? 1  Comment has macular degeneration  Difficulty concentrating or making decisions? 1  Walking or climbing stairs? 1  Dressing or bathing? 1  Comment needs help with bathing  Doing errands, shopping? 1  Preparing Food and eating ? N  Using the Toilet? N  In the past six months, have you accidently leaked urine? Y  Do you have problems with loss of bowel control? N  Managing your Medications? Y  Managing your Finances? Y  Housekeeping or managing your Housekeeping? Y    Patient Care Team: Melida Quitter, PA as PCP - General (Family Medicine) [name], MD as PCP - Cardiology (Cardiology) Rennis Chris, MD as Consulting Physician (Ophthalmology)  Indicate any recent Medical Services you may have received from other than [facility] providers in the past year (date may be approximate).     Assessment:   This is a routine wellness examination for [place].  Hearing/Vision screen Hearing Screening - Comments:: Has hearing aids that are maintained Vision Screening - Comments:: Regular eye exams, Dr. Chyrl Civatte   Goals Addressed             This Visit's Progress    Patient Stated       12/14/2023, wants to drink more water       Depression Screen     12/14/2023    8:16 AM 12/11/2023   10:05 AM 11/02/2023   10:50 AM 11/29/2022    1:22 PM 04/20/2022    1:47 PM 02/10/2022    9:03 AM 12/29/2021   11:21 AM  PHQ 2/9 Scores  PHQ - 2 Score 0 0 0 0 0 0 2  PHQ- 9 Score 3 0 0 0 0 9 9    Fall Risk     12/14/2023    8:15 AM 12/11/2023   10:05 AM 11/02/2023   10:50 AM 11/29/2022    1:22 PM 04/20/2022    1:46 PM  Fall Risk   Falls in the past year? 1 1  1 1 0  Comment rolled out of bed      Number falls in past yr: 1 1 1  0 0  Injury with  Fall? 1 1 1 1  0  Comment skin tear large skin tear     Risk for fall due to : Impaired mobility;Impaired balance/gait;History of fall(s);Medication side effect History of fall(s) History of fall(s)  History of fall(s);Impaired balance/gait;Impaired mobility  Follow up Falls prevention discussed;Falls evaluation completed Falls evaluation completed Falls evaluation completed;Follow up appointment Falls evaluation completed Falls evaluation completed  Comment   provider informed      MEDICARE RISK AT HOME:  Medicare Risk at Home Any stairs in or around the home?: Yes If so, are there any without handrails?: No Home free of loose throw rugs in walkways, pet beds, electrical cords, etc?: Yes Adequate lighting in your home to reduce risk of falls?: Yes Life alert?: No Use of a cane, walker or w/c?: Yes Grab bars in the bathroom?: Yes Shower chair or bench in shower?: Yes Elevated toilet seat or a handicapped toilet?: No  TIMED UP AND GO:  Was the test performed?  No  Cognitive Function: 6CIT completed      03/16/2022    9:43 AM  Montreal Cognitive Assessment   Visuospatial/ Executive (0/5) 3  Naming (0/3) 3  Attention: Read list of digits (0/2) 1  Attention: Read list of letters (0/1) 1  Attention: Serial 7 subtraction starting at 100 (0/3) 3  Language: Repeat phrase (0/2) 2  Language : Fluency (0/1) 0  Abstraction (0/2) 2  Delayed Recall (0/5) 0  Orientation (0/6) 4  Total 19  Adjusted Score (based on education) 19      12/14/2023    8:18 AM 11/29/2022    1:23 PM  6CIT Screen  What Year? 4 points 0 points  What month? 0 points 0 points  What time? 0 points 0 points  Count back from 20 0 points 0 points  Months in reverse 0 points 0 points  Repeat phrase 0 points 2 points  Total Score 4 points 2 points    Immunizations Immunization History  Administered Date(s) Administered   Influenza, High Dose Seasonal PF 08/08/2023   Influenza-Unspecified 08/11/2022   Moderna  Covid-19 Fall Seasonal Vaccine 33yrs & older 08/10/2022   Moderna Sars-Covid-2 Vaccination 10/21/2019, 11/18/2019, 09/16/2020   Pfizer(Comirnaty)Fall Seasonal Vaccine 12 years and older 08/08/2023   Pneumococcal Conjugate-13 08/01/2018   Pneumococcal Polysaccharide-23 07/31/2019   Tdap 10/07/2021   Zoster Recombinant(Shingrix) 07/29/2021, 10/07/2021    Screening Tests Health Maintenance  Topic Date Due   Medicare Annual Wellness (AWV)  12/13/2024   DTaP/Tdap/Td (2 - Td or Tdap) 10/08/2031   Pneumonia Vaccine 86+ Years old  Completed   INFLUENZA VACCINE  Completed   COVID-19 Vaccine  Completed   Zoster Vaccines- Shingrix  Completed   HPV VACCINES  Aged Out    Health Maintenance  There are no preventive care reminders to display for this patient. Health Maintenance Items Addressed: Up to date  Additional Screening:  Vision Screening: Recommended annual ophthalmology exams for early detection of glaucoma and other disorders of the eye.  Dental Screening: Recommended annual dental exams for proper oral hygiene  Community Resource Referral / Chronic Care Management: CRR required this visit?  No   CCM required this visit?  No     Plan:     I have personally reviewed and noted the following in the patient's chart:   Medical and social history  Use of alcohol, tobacco or illicit drugs  Current medications and supplements including opioid prescriptions. Patient is not currently taking opioid prescriptions. Functional ability and status Nutritional status Physical activity Advanced directives List of other physicians Hospitalizations, surgeries, and ER visits in previous 12 months Vitals Screenings to include cognitive, depression, and falls Referrals and appointments  In addition, I have reviewed and discussed with patient certain preventive protocols, quality metrics, and best practice recommendations. A written personalized care plan for preventive services as well  as general preventive health recommendations were provided to patient.     Barb Merino, LPN   06/10/4781   After Visit Summary: (MyChart) Due to this being a telephonic visit, the after visit summary with patients personalized plan was offered to patient via MyChart   Notes: Nothing significant to report at this time.

## 2023-12-19 ENCOUNTER — Ambulatory Visit: Payer: Medicare Other | Attending: Cardiology

## 2023-12-19 DIAGNOSIS — I4891 Unspecified atrial fibrillation: Secondary | ICD-10-CM

## 2023-12-19 DIAGNOSIS — Z5181 Encounter for therapeutic drug level monitoring: Secondary | ICD-10-CM | POA: Diagnosis not present

## 2023-12-19 LAB — POCT INR: INR: 2.6 (ref 2.0–3.0)

## 2023-12-19 NOTE — Patient Instructions (Signed)
 Description   Continue taking 1 tablet daily except 0.5 tablet on Sunday, Monday, Wednesday and Friday.  Stay consistent with greens (2 times per week)  Recheck INR 6 weeks  [facility] [phone]

## 2023-12-27 DIAGNOSIS — J449 Chronic obstructive pulmonary disease, unspecified: Secondary | ICD-10-CM | POA: Diagnosis not present

## 2023-12-27 DIAGNOSIS — J3089 Other allergic rhinitis: Secondary | ICD-10-CM | POA: Diagnosis not present

## 2023-12-28 ENCOUNTER — Telehealth: Payer: Self-pay | Admitting: *Deleted

## 2023-12-28 ENCOUNTER — Encounter: Payer: Self-pay | Admitting: Cardiology

## 2023-12-28 DIAGNOSIS — L820 Inflamed seborrheic keratosis: Secondary | ICD-10-CM | POA: Diagnosis not present

## 2023-12-28 DIAGNOSIS — S01402A Unspecified open wound of left cheek and temporomandibular area, initial encounter: Secondary | ICD-10-CM | POA: Diagnosis not present

## 2023-12-28 NOTE — Telephone Encounter (Signed)
 Copied from CRM [id number]. Topic: Referral - Request for Referral >> Dec 28, 2023  4:09 PM Antwanette L wrote: Did the patient discuss referral with their provider in the last year? Yes   Appointment offered? No. Patient saw pcp on 3/3  Type of order/referral and detailed reason for visit: pulmonary  Preference of office, provider, location: n/a  If referral order, have you been seen by this specialty before? No   Can we respond through MyChart? No. Contact patient  daughter Marlana Salvage at [phone]

## 2023-12-29 ENCOUNTER — Other Ambulatory Visit: Payer: Self-pay | Admitting: Family Medicine

## 2023-12-29 DIAGNOSIS — J449 Chronic obstructive pulmonary disease, unspecified: Secondary | ICD-10-CM

## 2023-12-29 NOTE — Telephone Encounter (Signed)
 I sent in the referral to pulm

## 2023-12-29 NOTE — Telephone Encounter (Signed)
Pt daughter informed of below.  

## 2024-01-01 ENCOUNTER — Other Ambulatory Visit: Payer: Self-pay | Admitting: Neurology

## 2024-01-03 DIAGNOSIS — L814 Other melanin hyperpigmentation: Secondary | ICD-10-CM | POA: Diagnosis not present

## 2024-01-03 DIAGNOSIS — D225 Melanocytic nevi of trunk: Secondary | ICD-10-CM | POA: Diagnosis not present

## 2024-01-03 DIAGNOSIS — L821 Other seborrheic keratosis: Secondary | ICD-10-CM | POA: Diagnosis not present

## 2024-01-23 NOTE — Progress Notes (Signed)
 [facility] - Clinic Note  02/06/2024     CHIEF COMPLAINT Patient presents for Retina Follow Up   HISTORY OF PRESENT ILLNESS: Warren Dinger. is a 89 y.o. male who presents to the clinic today for:   HPI     Retina Follow Up   Patient presents with  Wet AMD.  In right eye.  This started 14 months ago.  Duration of 8 weeks.  I, the attending physician,  performed the HPI with the patient and updated documentation appropriately.        Comments   Patient presents for 8 wk retina follow up, exu ARMD OD. Pt states no noticeable changes in vision. Pt states he does have FOL's quite often but not sure which eye they come from. Pt denies any new floaters. Pt is not using any Ats. Pt states eyes water a lot of the time and are sometimes matted in the mornings. Pt is still taking Ocuvite every day. Pt has not been wearing glasses because they do not seem to help much.      Last edited by Ronelle Coffee, MD on 02/06/2024 11:59 AM.     Pt states vision is stable  Referring physician: Noreene Bearded, PA [address] [place],  [place] [address]  HISTORICAL INFORMATION:   Selected notes from the MEDICAL RECORD NUMBER [name] transferring care due to insurance LEE: 03.27.23 [BCVA 20/40 OD, CF OS] Ocular Hx- ex ARMD OU; last injection was IVA OS on 05.24.21  PMH-    CURRENT MEDICATIONS: No current outpatient medications on file. (Ophthalmic Drugs)   No current facility-administered medications for this visit. (Ophthalmic Drugs)   Current Outpatient Medications (Other)  Medication Sig   acetaminophen  (TYLENOL ) 500 MG tablet Take 1,000 mg by mouth every 6 (six) hours as needed for moderate pain or headache.   albuterol  (VENTOLIN  HFA) 108 (90 Base) MCG/ACT inhaler Inhale 2 puffs into the lungs every 6 (six) hours as needed for wheezing or shortness of breath.   EQ ALLERGY RELIEF, CETIRIZINE , 10 MG tablet Take 1 tablet by mouth once daily    Fluticasone -Umeclidin-Vilant (TRELEGY ELLIPTA ) 100-62.5-25 MCG/ACT AEPB INHALE 1 PUFF ONCE DAILY   furosemide  (LASIX ) 40 MG tablet Take 1 tablet by mouth once daily   memantine  (NAMENDA ) 10 MG tablet Take 1 tablet (10 mg total) by mouth 2 (two) times daily.   Multiple Vitamins-Minerals (MULTIVITAMIN WITH MINERALS) tablet Take 1 tablet by mouth daily.   Multiple Vitamins-Minerals (OCUVITE PO) Take 1 tablet by mouth daily.   OMEGA-3 FATTY ACIDS PO Take 500 mg by mouth daily.   sodium chloride  (OCEAN) 0.65 % SOLN nasal spray Place 1 spray into both nostrils daily as needed for congestion.   warfarin (COUMADIN ) 5 MG tablet TAKE 1/2 TO 1 (ONE-HALF TO ONE) TABLET BY MOUTH ONCE DAILY AS DIRECTED BY ANTICOAGULATION CLINIC   finasteride  (PROSCAR ) 5 MG tablet Take 1 tablet by mouth once daily   metoprolol  succinate (TOPROL -XL) 50 MG 24 hr tablet Take 0.5 tablets (25 mg total) by mouth daily. Take with or immediately following a meal. May split current pills in half until bottle is gone.   No current facility-administered medications for this visit. (Other)   REVIEW OF SYSTEMS: ROS   Positive for: Cardiovascular, Eyes Negative for: Constitutional, Gastrointestinal, Neurological, Skin, Genitourinary, Musculoskeletal, HENT, Endocrine, Respiratory, Psychiatric, Allergic/Imm, Heme/Lymph Last edited by Carrington Clack, COT on 02/06/2024  8:26 AM.     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 Abel Hoe & Dr. Sherene Dilling   Severe aortic stenosis    Umbilical hernia 07/27/2012   Past Surgical History:  Procedure Laterality Date   CARDIOVERSION  10/10/2006    cataracts Bilateral    HERNIA REPAIR  09/10/2012   large umbilical hernia   INSERTION OF MESH  09/10/2012   Procedure: INSERTION OF MESH;  Surgeon: Cloyce Darby, MD;  Location: [facility] OR;  Service: General;  Laterality: N/A;   INTRAOPERATIVE TRANSTHORACIC ECHOCARDIOGRAM N/A 10/18/2022   Procedure: INTRAOPERATIVE TRANSTHORACIC ECHOCARDIOGRAM;  Surgeon: Odie Benne, MD;  Location: [facility] INVASIVE CV LAB;  Service: Open Heart Surgery;  Laterality: N/A;   macular degeneration Bilateral    RIGHT HEART CATH AND CORONARY ANGIOGRAPHY N/A 09/09/2022   Procedure: RIGHT HEART CATH AND CORONARY ANGIOGRAPHY;  Surgeon: Odie Benne, MD;  Location: [facility] INVASIVE CV LAB;  Service: Cardiovascular;  Laterality: N/A;   TONSILLECTOMY     "maybe" (09/10/2012)   TRANSCATHETER AORTIC VALVE REPLACEMENT, TRANSFEMORAL N/A 10/18/2022   Procedure: Transcatheter Aortic Valve Replacement, Transfemoral;  Surgeon: Odie Benne, MD;  Location: [facility]VASIVE CV LAB;  Service: Open Heart Surgery;  Laterality: N/A;   UMBILICAL HERNIA REPAIR  09/10/2012   Procedure: HERNIA REPAIR UMBILICAL ADULT;  Surgeon: Cloyce Darby, MD;  Location: [facility] OR;  Service: General;  Laterality: N/A;   FAMILY HISTORY Family History  Problem Relation Age of Onset   Heart disease Mother    Stroke Father    Alzheimer's disease Neg Hx    Dementia Neg Hx    SOCIAL HISTORY Social History   Tobacco Use   Smoking status: Former    Current packs/day: 0.00    Average packs/day: 1.5 packs/day for 10.0 years (15.0 ttl pk-yrs)    Types: Cigarettes    Start date: 07/27/1972    Quit date: 07/27/1982    Years since quitting: 41.5   Smokeless tobacco: Never  Vaping Use   Vaping status: Never Used  Substance Use Topics   Alcohol  use: Not Currently   Drug use: No       OPHTHALMIC EXAM:  Base Eye Exam     Visual Acuity (Snellen - Linear)       Right Left   Dist [place] 20/100 -2 CF @2 '   Dist ph [place] 20/70 +2 NI          Tonometry (Tonopen, 8:42 AM)       Right Left   Pressure 12 13         Pupils       Pupils Dark Light Shape React APD   Right PERRL 2 1 Round Brisk None   Left PERRL 2 1 Round Brisk None         Visual Fields       Left Right    Full Full         Extraocular Movement       Right Left    Full, Ortho Full, Ortho         Neuro/Psych     Oriented x3: Yes   Mood/Affect: Normal  Dilation     Both eyes: 1.0% Mydriacyl, 2.5% Phenylephrine  @ 8:44 AM           Slit Lamp and Fundus Exam     External Exam       Right Left   External Normal Normal         Slit Lamp Exam       Right Left   Lids/Lashes Dermatochalasis - upper lid, Ptosis Dermatochalasis - upper lid, mild MGD   Conjunctiva/Sclera White and quiet White and quiet   Cornea 1-2+ Punctate epithelial erosions, mild arcus, well healed cataract wound, tear film debris Trace tear film debris, well healed cataract wound, arcus   Anterior Chamber deep and clear deep and clear   Iris Round and dilated round and poorly dilated   Lens PC IOL in good position, 1-2+ Posterior capsular opacification PC IOL in good position, 2-3+ Posterior capsular opacification   Anterior Vitreous mild syneresis Vitreous syneresis         Fundus Exam       Right Left   Posterior Vitreous Posterior vitreous detachment Posterior vitreous detachment, vitreous condensations   Disc 2+Pallor, Sharp rim, Compact 2+Pallor, Sharp rim   C/D Ratio 0.2 0.3   Macula Blunted foveal reflex, refractile drusen, RPE mottling, clumping and atrophy, focal central edema -- stably improved, no frank heme Flat, blunted foveal reflex, refractile drusen, RPE mottling, clumping and atrophy, central GA, no frank heme   Vessels attenuated, Tortuous attenuated, Tortuous   Periphery Attached, reticular degeneration, no heme Attached, reticlar degeneration, no heme           IMAGING AND PROCEDURES  Imaging and Procedures for  02/06/2024  OCT, Retina - OU - Both Eyes       Right Eye Quality was good. Scan locations included subfoveal. Central Foveal Thickness: 203. Progression has been stable. Findings include normal foveal contour, no IRF, retinal drusen , subretinal hyper-reflective material, pigment epithelial detachment, subretinal fluid, outer retinal atrophy (Patchy central ORA with PEDs, stable improvement in central pockets of SRF).   Left Eye Quality was poor. Scan locations included subfoveal. Central Foveal Thickness: 237. Progression has been stable. Findings include no IRF, no SRF, abnormal foveal contour, retinal drusen , outer retinal tubulation, pigment epithelial detachment, outer retinal atrophy (central ORA / GA with PEDs and ORT).   Notes *Images captured and stored on drive  Diagnosis / Impression:  OD: Patchy central ORA with PEDs, stable improvement in central pockets of SRF OS: non exudative ARMD - central ORA / GA with PEDs and ORT  Clinical management:  See below  Abbreviations: NFP - Normal foveal profile. CME - cystoid macular edema. PED - pigment epithelial detachment. IRF - intraretinal fluid. SRF - subretinal fluid. EZ - ellipsoid zone. ERM - epiretinal membrane. ORA - outer retinal atrophy. ORT - outer retinal tubulation. SRHM - subretinal hyper-reflective material. IRHM - intraretinal hyper-reflective material      Intravitreal Injection, Pharmacologic Agent - OD - Right Eye       Time Out 02/06/2024. 9:31 AM. Confirmed correct patient, procedure, site, and patient consented.   Anesthesia Topical anesthesia was used. Anesthetic medications included Lidocaine  2%, Proparacaine 0.5%.   Procedure Preparation included 5% betadine to ocular surface, eyelid speculum. A (32g) needle was used.   Injection: 2 mg aflibercept  2 MG/0.05ML   Route: Intravitreal, Site: Right Eye   NDC: [id number], Lot: [id number], Expiration date: 02/06/2025, Waste: 0 mL   Post-op Post injection  exam found visual acuity  of at least counting fingers. The patient tolerated the procedure well. There were no complications. The patient received written and verbal post procedure care education.            ASSESSMENT/PLAN:    ICD-10-CM   1. Exudative age-related macular degeneration of right eye with active choroidal neovascularization (HCC)  H35.3211 OCT, Retina - OU - Both Eyes    Intravitreal Injection, Pharmacologic Agent - OD - Right Eye    aflibercept  (EYLEA ) SOLN 2 mg    2. 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 Essential hypertension  I10     4. Hypertensive retinopathy of both eyes  H35.033     5. Pseudophakia, both eyes  Z96.1      Exudative age related macular degeneration, OD  - previous Dr. Seward Dao pt here due to insurance  - pt reports history of injections OD w/ [name], but unable to see record of that - here s/p IVA OD #1 (02.07.24), #2 (03.13.24), #3 (04.17.24), #4 (05.31.24) -- IVA resistance  - s/p IVE OD #1 (06.28.24), #2 (07.29.24), #3 (08.26.24), #4 (09.30.24), #5 (11.11.24), #6 (12.30.24), #7 (02.25.25)  - BCVA OD stable at 20/70  - OCT OD: Patchy central ORA with PEDs, stable improvement in central pockets of SRF at 9 weeks  - recommend IVE OD #8 today, 04.29.25 w/ f/u up extended to 10 wks  - Good Days unavailable -- pt covering 20% coinsurance for medication - pt wishes to proceed with injection - RBA of procedure discussed, questions answered - IVE informed consent obtained and signed, 06.28.24 (OD) - see procedure note   - f/u in 10 wks -- DFE/OCT, possible injection  2. Age related macular degeneration, non-exudative, left eye  - advanced stage w/ significant central GA  - BCVA CF 3' -- stable   - history of IVA OS w/ [name], last 05.24.21  - OCT OS shows Central ORA / GA with PEDs and ORT  - Recommend amsler grid monitoring  - monitor  3,4. Hypertensive  retinopathy OU - discussed importance of tight BP control - monitor  5. Pseudophakia OU  - s/p CE/IOL (Dr. Gennie Kicks)  - IOL in good position, doing well  - monitor  Ophthalmic Meds Ordered this visit:  Meds ordered this encounter  Medications   aflibercept  (EYLEA ) SOLN 2 mg     Return in about 10 weeks (around 04/16/2024) for f/u exu ARMD OD, DFE, OCT, Possible Injxn.  There are no Patient Instructions on file for this visit.  This document serves as a record of services personally performed by Jeanice Millard, MD, PhD. It was created on their behalf by Olene Berne, COT an ophthalmic technician. The creation of this record is the provider's dictation and/or activities during the visit.    Electronically signed by:  Olene Berne, COT  02/06/24 12:12 PM  This document serves as a record of services personally performed by Jeanice Millard, MD, PhD. It was created on their behalf by Morley Arabia. Bevin Bucks, OA an ophthalmic technician. The creation of this record is the provider's dictation and/or activities during the visit.    Electronically signed by: Morley Arabia. Bevin Bucks, OA 02/06/24 12:12 PM  Jeanice Millard, M.D., Ph.D. Diseases & Surgery of the Retina and Vitreous [facility]  I have reviewed the above documentation for accuracy and completeness, and I agree with the above. Astryd Pearcy G. Loghan Subia,  M.D., Ph.D. 02/06/24 12:20 PM   Abbreviations: M myopia (nearsighted); A astigmatism; H hyperopia (farsighted); P presbyopia; Mrx spectacle prescription;  CTL contact lenses; OD right eye; OS left eye; OU both eyes  XT exotropia; ET esotropia; PEK punctate epithelial keratitis; PEE punctate epithelial erosions; DES dry eye syndrome; MGD meibomian gland dysfunction; ATs artificial tears; PFAT's preservative free artificial tears; NSC nuclear sclerotic cataract; PSC posterior subcapsular cataract; ERM epi-retinal membrane; PVD posterior vitreous detachment; RD retinal  detachment; DM diabetes mellitus; DR diabetic retinopathy; NPDR non-proliferative diabetic retinopathy; PDR proliferative diabetic retinopathy; CSME clinically significant macular edema; DME diabetic macular edema; dbh dot blot hemorrhages; CWS cotton wool spot; POAG primary open angle glaucoma; C/D cup-to-disc ratio; HVF humphrey visual field; GVF goldmann visual field; OCT optical coherence tomography; IOP intraocular pressure; BRVO Branch retinal vein occlusion; CRVO central retinal vein occlusion; CRAO central retinal artery occlusion; BRAO branch retinal artery occlusion; RT retinal tear; SB scleral buckle; PPV pars plana vitrectomy; VH Vitreous hemorrhage; PRP panretinal laser photocoagulation; IVK intravitreal kenalog; VMT vitreomacular traction; MH Macular hole;  NVD neovascularization of the disc; NVE neovascularization elsewhere; AREDS age related eye disease study; ARMD age related macular degeneration; POAG primary open angle glaucoma; EBMD epithelial/anterior basement membrane dystrophy; ACIOL anterior chamber intraocular lens; IOL intraocular lens; PCIOL posterior chamber intraocular lens; Phaco/IOL phacoemulsification with intraocular lens placement; PRK photorefractive keratectomy; LASIK laser assisted in situ keratomileusis; HTN hypertension; DM diabetes mellitus; COPD chronic obstructive pulmonary disease

## 2024-01-27 DIAGNOSIS — J3089 Other allergic rhinitis: Secondary | ICD-10-CM | POA: Diagnosis not present

## 2024-01-30 ENCOUNTER — Ambulatory Visit: Attending: Cardiology

## 2024-01-30 ENCOUNTER — Other Ambulatory Visit: Payer: Self-pay | Admitting: Family Medicine

## 2024-01-30 DIAGNOSIS — I4891 Unspecified atrial fibrillation: Secondary | ICD-10-CM

## 2024-01-30 DIAGNOSIS — J3089 Other allergic rhinitis: Secondary | ICD-10-CM

## 2024-01-30 DIAGNOSIS — Z5181 Encounter for therapeutic drug level monitoring: Secondary | ICD-10-CM | POA: Diagnosis not present

## 2024-01-30 LAB — POCT INR: INR: 2.2 (ref 2.0–3.0)

## 2024-01-30 NOTE — Patient Instructions (Signed)
 Description   Continue taking 1 tablet daily except 0.5 tablet on Sunday, Monday, Wednesday and Friday.  Stay consistent with greens (2 times per week)  Recheck INR 6 weeks  [facility] [phone]

## 2024-02-01 ENCOUNTER — Encounter: Payer: Self-pay | Admitting: Neurology

## 2024-02-01 ENCOUNTER — Ambulatory Visit: Payer: Medicare Other | Admitting: Neurology

## 2024-02-01 VITALS — BP 159/92 | HR 76 | Ht 72.0 in | Wt 218.0 lb

## 2024-02-01 DIAGNOSIS — F02B18 Dementia in other diseases classified elsewhere, moderate, with other behavioral disturbance: Secondary | ICD-10-CM | POA: Diagnosis not present

## 2024-02-01 DIAGNOSIS — G301 Alzheimer's disease with late onset: Secondary | ICD-10-CM | POA: Diagnosis not present

## 2024-02-01 MED ORDER — MEMANTINE HCL 10 MG PO TABS: 10.0000 mg | ORAL_TABLET | Freq: Two times a day (BID) | ORAL | 3 refills | Status: AC

## 2024-02-01 NOTE — Patient Instructions (Addendum)
 Continue with Namenda  10 mg twice daily, refill given Continue your other medications Continue to follow with PCP Return as needed.   Alzheimer's Disease Alzheimer's disease is a disease that affects the way your brain works. It affects memory and causes changes in how you think, talk, and act. The disease gets worse over time. Alzheimer's disease is a form of dementia. What are the causes? Alzheimer's disease happens when a protein called beta-amyloid forms deposits in the brain. It's not known what causes these to form. The disease may also be caused by a harmful change in a gene that's passed down, or inherited, from one or both parents. Not everyone who gets the changed gene from their parents will get the disease. What increases the risk? Being older than 87 years of age. Being male. Having any of these conditions: High blood pressure. Diabetes. Heart or blood vessel disease. Smoking. Being very overweight. Having a brain injury or a stroke in the past. Having a family history of dementia. What are the signs or symptoms?  Symptoms may happen in three stages, which often overlap. Early stage In this stage, you can still do things on your own. You may still be able to drive, work, and be social. Symptoms in this stage include: Forgetting small things, like a name, words, or what you did recently. Having a hard time with: Paying attention or learning new things. Talking with people. Doing your usual tasks. Solving problems or doing math. Following instructions. Feeling worried or nervous. Not wanting to be around people. Losing interest in doing things. Moderate stage In this stage, you'll start to need care. Symptoms include: Trouble saying what you're thinking. Memory loss that affects daily life. This can include forgetting: Things that happened recently. If you've taken medicines or eaten. Places you know. You may get lost while walking or driving. To pay  bills. To bathe or use the bathroom. Being confused about where you are or what time it is. Trouble judging distance. Changes in how you feel or act. You may be moody, angry, upset, scared, worried, or suspicious. Not thinking clearly or making good choices. You may have delusions or false beliefs. Hallucinations. This means you see, hear, taste, smell, or feel things that aren't real. Severe stage In the severe stage, you'll need help with your personal care and daily activities. Symptoms include: Memory loss getting worse. Personality changes. Not knowing where you are. Physical problems, like trouble walking, sitting, or swallowing. More trouble talking with others. Not being able to control when you pee (urinate) or poop. More changes in how you act. How is this diagnosed? You may need to see a nervous system specialist, called a neurologist, or a health care provider who focuses on the care of older adults. Alzheimer's disease may be diagnosed based on: Your symptoms and medical history. Your provider will talk with you and your family, friends, and caregivers about your history and symptoms. A physical exam. Tests. These may include: Lab tests, such as tests on your blood or pee. Imaging tests. You may have a CT scan, a PET scan, or an MRI. A lumbar puncture. For this test, a sample of the fluid around the brain and spinal cord is taken and tested. Tests to check your thinking and memory. Genetic testing. This may be done if you get the disease before age 26 or if other family members have had the disease. How is this treated? At this time, there's no cure for Alzheimer's disease. The goals  of treatment are to: Manage symptoms that affect behavior. Make sure you're safe at home. Help manage daily life for you and your caregivers. Treatment may include: Medicines. You may get medicines that can: Slow down how fast the disease gets worse. Help with memory and  behavior. Cognitive therapy. This gives you support, training to help with thinking skills, and memory aids. Counseling or spiritual guidance. These can help you deal with the many feelings you may have, such as fear, anger, or feeling alone. Caregivers. These are people who help you with your daily tasks. Family support groups. These allow your family members to learn about the disease, get emotional support, and find out about resources to help take care of you. Follow these instructions at home:  Medicines Take over-the-counter and prescription medicines only as told by your provider. Use a pill organizer or pill reminder to help you keep track of your medicines. Avoid taking medicines that can affect thinking, such as medicines for pain or sleep. Lifestyle Make healthy choices: Be active as told by your provider. Regular exercise may help with symptoms. Do not smoke, vape, or use products with nicotine or tobacco in them. Do not drink alcohol . Eat a healthy diet. When you feel a lot of stress, do something that helps you relax. Try things like yoga or deep breathing. Spend time with people. Drink enough fluid to keep your pee pale yellow. Make sure you sleep well. These tips can help: Try not to take long naps during the day. Take short naps of 30 minutes or less if needed. Keep your bedroom dark and cool. Do not exercise during the few hours before you go to bed. Avoid caffeine products in the afternoon and evening. General instructions Work with your provider to decide: What things you need help with. What your safety needs are. If you were given a bracelet that tracks where you are and shows that you're a person with memory loss, make sure you wear it at all times. Talk with your provider about whether it's safe for you to drive. Work with your family to make big legal or health decisions. This may include things like advance directives, medical power of attorney, or a living  will. Where to find more information There are two ways to contact the Alzheimer's Association: Call the 24-hour helpline at 978 224 4284. Visit [employer] Contact a health care provider if: Your medicine causes you to have nausea, vomiting, or trouble with eating. You have mood or behavior changes that are getting worse, such as feeling depressed, worried, or nervous. You have hallucinations. You or your family members are worried about your safety. You're hard to wake up. Your memory suddenly gets worse. Get help right away if: You feel like you may hurt yourself or others. You have thoughts about taking your own life. Take one of these steps: Go to your nearest emergency room. Call 911. Call the National Suicide Prevention Lifeline at 4085044229 or 988. Text the Crisis Text Line at 220-331-0410. This information is not intended to replace advice given to you by your health care provider. Make sure you discuss any questions you have with your health care provider. Document Revised: 12/27/2022 Document Reviewed: 12/27/2022 Elsevier Patient Education  2024 [employer].

## 2024-02-01 NOTE — Progress Notes (Signed)
 [facility]  PATIENT: Warren Wagner. DOB: June 11, 1935  REQUESTING CLINICIAN: Noreene Bearded, PA HISTORY FROM: Patient and 2 daughters Warren Wagner and Warren Wagner REASON FOR VISIT: Memory decline   HISTORICAL  CHIEF COMPLAINT:  Chief Complaint  Patient presents with   Follow-up    Pt in 13 with daughter Pt here for memory f/u Pt and daughter states memory same since last office visit     INTERVAL HISTORY 02/01/2024: Warren Wagner presents today for follow-up, last visit was a year ago, since then he has been stable, daughter tells me that he is the same, memory has not been getting worse.  He still seeing bugs crawling on the floor, and in his food.  When on the highway, he feels like the strokes are racing with the car.  Some night, he thinks that people are coming thru the windows and he will call for help. They deny any agitation, any increased irritability, and no new falls.  He is compliant with his medications.   INTERVAL HISTORY 02/01/2023:  Patient presents today for follow-up, last visit was over a year ago.  He states that he has been stable, compliant with Namenda .  In January he did have aortic valve replacement and since then he has been having hallucinations.  He is seeing bugs crawling on the floor, bugs in his food and also seeing people in his house and little children.  He is aware that he is only 1 that sees them and it bothers patient.  Family reported that otherwise he has been stable, has not had any major worsening in term of the memory.   HISTORY OF PRESENT ILLNESS:  This is a 89 year old gentleman past medical history of atrial fibrillation, aortic stenosis, hypertension, insomnia, BPH, and hearing loss who is presenting with daughters Warren Wagner and Warren Wagner with concern of memory.  Patient reported his memory is not what he used to be, he is more forgetful than previously.  Currently he does live with his daughters after selling his house and need help  with most activities of daily living.  Per daughters, patient is more forgetful, he does not talk that much but can get irritable if daughters are trying to have a conversation with him.  He was recently admitted to the hospital for UTI but since discharge his forgetfulness has worsened, sometimes he will forget to change his pull-up or forget to put his pans back on.  He needs reminder to take a shower, He has issue with taking his medications, sometime he will forget.  Daughters feel like they have to teach him the same thing over and over like how to get out of the car.  Family has to repeat himself themselves multiple times.  During conversation he can change topic frequently.  Daughters are helping managing his bill.  They reported agitation at time, that he is easily frustrated.  Denies any hallucinations.  Currently is not driving but he will get confused when going to familiar places.    TBI:   No past history of TBI Stroke:   no past history of stroke Seizures:   no past history of seizures Sleep:   no history of sleep apnea.   Mood:   patient denies anxiety and depression  Functional status: Dependent in most ADLs and IADLs Patient lives with daughter  Cooking: no Cleaning: no Shopping: no Bathing: need help  Toileting: need help  Driving: no, last time was 1 year due to vision loss  Bills: Daughters  Ever left the stove on by accident?: no Forget how to use items around the house?: yes  Getting lost going to familiar places?: yes Forgetting loved ones names?: no  Word finding difficulty? Yes   Sleep: very poor, will sleep most of the day and up at night    OTHER MEDICAL CONDITIONS: Atrial fibrillation, aortic stenosis, hypertension, hearing loss, insomnia, BPH    REVIEW OF SYSTEMS: Full 14 system review of systems performed and negative with exception of: as noted in the HPI   ALLERGIES: No Known Allergies  HOME MEDICATIONS: Outpatient Medications Prior to Visit   Medication Sig Dispense Refill   acetaminophen  (TYLENOL ) 500 MG tablet Take 1,000 mg by mouth every 6 (six) hours as needed for moderate pain or headache.     albuterol  (VENTOLIN  HFA) 108 (90 Base) MCG/ACT inhaler Inhale 2 puffs into the lungs every 6 (six) hours as needed for wheezing or shortness of breath. 2 each 11   EQ ALLERGY RELIEF, CETIRIZINE , 10 MG tablet Take 1 tablet by mouth once daily 90 tablet 0   Fluticasone -Umeclidin-Vilant (TRELEGY ELLIPTA ) 100-62.5-25 MCG/ACT AEPB INHALE 1 PUFF ONCE DAILY 60 each 1   furosemide  (LASIX ) 40 MG tablet Take 1 tablet by mouth once daily 90 tablet 3   Multiple Vitamins-Minerals (MULTIVITAMIN WITH MINERALS) tablet Take 1 tablet by mouth daily.     Multiple Vitamins-Minerals (OCUVITE PO) Take 1 tablet by mouth daily.     OMEGA-3 FATTY ACIDS PO Take 500 mg by mouth daily.     sodium chloride  (OCEAN) 0.65 % SOLN nasal spray Place 1 spray into both nostrils daily as needed for congestion.     warfarin (COUMADIN ) 5 MG tablet TAKE 1/2 TO 1 (ONE-HALF TO ONE) TABLET BY MOUTH ONCE DAILY AS DIRECTED BY ANTICOAGULATION CLINIC 65 tablet 1   memantine  (NAMENDA ) 10 MG tablet Take 1 tablet by mouth twice daily 180 tablet 0   finasteride  (PROSCAR ) 5 MG tablet Take 1 tablet by mouth once daily 90 tablet 0   metoprolol  succinate (TOPROL -XL) 50 MG 24 hr tablet Take 0.5 tablets (25 mg total) by mouth daily. Take with or immediately following a meal. May split current pills in half until bottle is gone. 90 tablet 3   No facility-administered medications prior to visit.    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 Abel Hoe & Dr. Sherene Dilling   Severe aortic  stenosis    Umbilical hernia 07/27/2012    PAST SURGICAL HISTORY: Past Surgical History:  Procedure Laterality Date   CARDIOVERSION  10/10/2006   cataracts Bilateral    HERNIA REPAIR  09/10/2012   large umbilical hernia   INSERTION OF MESH  09/10/2012   Procedure: INSERTION OF MESH;  Surgeon: Cloyce Darby, MD;  Location: [facility] OR;  Service: General;  Laterality: N/A;   INTRAOPERATIVE TRANSTHORACIC ECHOCARDIOGRAM N/A 10/18/2022   Procedure: INTRAOPERATIVE TRANSTHORACIC ECHOCARDIOGRAM;  Surgeon: Odie Benne, MD;  Location: [facility] INVASIVE CV LAB;  Service: Open Heart Surgery;  Laterality: N/A;   macular degeneration Bilateral    RIGHT HEART CATH AND CORONARY ANGIOGRAPHY N/A 09/09/2022   Procedure: RIGHT HEART CATH AND CORONARY ANGIOGRAPHY;  Surgeon: Odie Benne, MD;  Location: [facility] INVASIVE CV LAB;  Service: Cardiovascular;  Laterality: N/A;   TONSILLECTOMY     "maybe" (09/10/2012)   TRANSCATHETER AORTIC VALVE REPLACEMENT, TRANSFEMORAL N/A 10/18/2022   Procedure: Transcatheter Aortic Valve Replacement, Transfemoral;  Surgeon: Odie Benne, MD;  Location: [facility] INVASIVE CV LAB;  Service: Open Heart Surgery;  Laterality: N/A;   UMBILICAL HERNIA REPAIR  09/10/2012   Procedure: HERNIA REPAIR UMBILICAL ADULT;  Surgeon: Cloyce Darby, MD;  Location: [facility] OR;  Service: General;  Laterality: N/A;    FAMILY HISTORY: Family History  Problem Relation Age of Onset   Heart disease Mother    Stroke Father    Alzheimer's disease Neg Hx    Dementia Neg Hx     SOCIAL HISTORY: Social History   Socioeconomic History   Marital status: Widowed    Spouse name: Not on file   Number of children: 3   Years of education: Not on file   Highest education level: Bachelor's degree (e.g., BA, AB, BS)  Occupational History   Occupation: Naval architect  Tobacco Use   Smoking status: Former    Current packs/day: 0.00    Average packs/day: 1.5 packs/day for 10.0 years (15.0 ttl  pk-yrs)    Types: Cigarettes    Start date: 07/27/1972    Quit date: 07/27/1982    Years since quitting: 41.5   Smokeless tobacco: Never  Vaping Use   Vaping status: Never Used  Substance and Sexual Activity   Alcohol  use: Not Currently   Drug use: No   Sexual activity: Not Currently    Birth control/protection: None  Other Topics Concern   Not on file  Social History Narrative   Lives with 2 daughters. Three children.    Retired    Teacher, early years/pre Strain: Low Risk  (12/14/2023)   Overall Financial Resource Strain (CARDIA)    Difficulty of Paying Living Expenses: Not hard at all  Food Insecurity: No Food Insecurity (12/14/2023)   Hunger Vital Sign    Worried About Running Out of Food in the Last Year: Never true    Ran Out of Food in the Last Year: Never true  Transportation Needs: No Transportation Needs (12/14/2023)   PRAPARE - Administrator, Civil Service (Medical): No    Lack of Transportation (Non-Medical): No  Physical Activity: Inactive (12/14/2023)   Exercise Vital Sign    Days of Exercise per Week: 0 days    Minutes of Exercise per Session: 0 min  Stress: No Stress Concern Present (12/14/2023)   [employer] of Occupational Health - Occupational Stress Questionnaire    Feeling of Stress : Not at all  Social Connections: Socially Isolated (12/14/2023)   Social Connection and Isolation Panel [NHANES]    Frequency of Communication with Friends and Family: Never    Frequency of Social Gatherings with Friends and Family: More than three times a week    Attends Religious Services: Never    Database administrator or Organizations: No    Attends Banker Meetings: Never    Marital Status: Widowed  Intimate Partner Violence: Not At Risk (12/14/2023)   Humiliation, Afraid, Rape, and Kick questionnaire    Fear of Current or Ex-Partner: No    Emotionally Abused: No    Physically Abused: No    Sexually Abused: No     PHYSICAL EXAM  GENERAL EXAM/CONSTITUTIONAL: Vitals:  Vitals:   02/01/24 1105  BP: (!) 159/92  Pulse: 76  Weight: 218 lb (98.9 kg)  Height: 6' (1.829 m)    Body mass index is 29.57 kg/m. Wt Readings from Last 3 Encounters:  02/01/24 218 lb (98.9 kg)  12/11/23 216 lb 12 oz (98.3 kg)  11/02/23 222 lb 12 oz (101 kg)   Patient is in no distress; well developed, nourished and groomed; neck is supple  MUSCULOSKELETAL: Gait, strength, tone, movements noted in Neurologic exam below  NEUROLOGIC: MENTAL STATUS:     02/01/2024   11:11 AM  MMSE - Mini Mental State Exam  Orientation to time 4  Orientation to Place 4  Registration 3  Attention/ Calculation 1  Recall 2  Language- name 2 objects 1  Language- repeat 1  Language- follow 3 step command 3  Language- read & follow direction 1  Write a sentence 0  Copy design 0  Total score 20       03/16/2022    9:43 AM  Montreal Cognitive Assessment   Visuospatial/ Executive (0/5) 3  Naming (0/3) 3  Attention: Read list of digits (0/2) 1  Attention: Read list of letters (0/1) 1  Attention: Serial 7 subtraction starting at 100 (0/3) 3  Language: Repeat phrase (0/2) 2  Language : Fluency (0/1) 0  Abstraction (0/2) 2  Delayed Recall (0/5) 0  Orientation (0/6) 4  Total 19  Adjusted Score (based on education) 19    CRANIAL NERVE:  2nd, 3rd, 4th, 6th - visual fields full to confrontation, extraocular muscles intact, no nystagmus 5th - facial sensation symmetric 7th - facial strength symmetric 8th - hearing intact 9th - palate elevates symmetrically, uvula midline 11th - shoulder shrug symmetric 12th - tongue protrusion midline  MOTOR:  normal bulk and tone, full strength in the BUE, BLE  SENSORY:  normal and symmetric to light touch  COORDINATION:  finger-nose-finger  GAIT/STATION:  Walk with a walker    DIAGNOSTIC DATA (LABS, IMAGING, TESTING) - I reviewed patient records, labs, notes, testing and imaging  myself where available.  Lab Results  Component Value Date   WBC 7.8 12/04/2023   HGB 16.2 12/04/2023   HCT 48.2 12/04/2023   MCV 89 12/04/2023   PLT 153 12/04/2023      Component Value Date/Time   NA 144 12/04/2023 0818   K 4.2 12/04/2023 0818   CL 103 12/04/2023 0818   CO2 25 12/04/2023 0818   GLUCOSE 81 12/04/2023 0818   GLUCOSE 115 (H) 10/19/2022 0057   BUN 21 12/04/2023 0818   CREATININE 1.30 (H) 12/04/2023 0818   CALCIUM 9.4 12/04/2023 0818   PROT 6.5 12/04/2023 0818   ALBUMIN  4.1 12/04/2023 0818   AST 35 12/04/2023 0818   ALT 20 12/04/2023 0818   ALKPHOS 103 12/04/2023 0818   BILITOT 0.9 12/04/2023 0818   GFRNONAA >60 10/19/2022 0057   GFRAA 69 12/04/2019 1109   Lab Results  Component Value Date   CHOL 216 (H) 12/04/2023   HDL 59 12/04/2023   LDLCALC 142 (H) 12/04/2023   TRIG 82 12/04/2023   CHOLHDL 3.7 12/04/2023   Lab Results  Component Value Date   HGBA1C 5.6 01/06/2022   Lab Results  Component Value Date   VITAMINB12 911 03/16/2022   Lab Results  Component Value Date   TSH 4.120 06/15/2023    CT Head 02/02/22 1. No acute intracranial abnormalities. 2. Chronic small vessel ischemic disease and brain atrophy. 3. Chronic left maxillary sinus inflammation. 4. Periapical lucency involving a  left upper tooth for which periapical abscess cannot be excluded.   CT Head 11/25/2022 Atrophy, chronic microvascular disease. No acute intracranial abnormality.  MRI Brain 02/16/2023 - Moderate generalized atrophy, slightly progressed since 2008. - No acute findings.   ASSESSMENT AND PLAN  89 y.o. year old male with history of atrial fibrillation, aortic stenosis s/p AVR, heart disease, BPH, hearing loss who is presenting for follow up for his dementia.  Memory has been stable per family, he is compliant with his Namenda  10 mg twice daily.  He still has hallucinations, seeing bugs on the floor, sometimes thinking people are coming through the window but  family reports these are not problematic to patient.  He does need help with showering, he can dress himself.  He does use a walker with ambulation, they deny any recent fall.  Plan will be for patient to continue with Namenda  10 mg twice daily, continue all medications, continue to follow with PCP and return as needed.  This was discussed with patient and family, and they are comfortable with plans.   1. 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   Alzheimer's Disease Alzheimer's disease is a disease that affects the way your brain works. It affects memory and causes changes in how you think, talk, and act. The disease gets worse over time. Alzheimer's disease is a form of dementia. What are the causes? Alzheimer's disease happens when a protein called beta-amyloid forms deposits in the brain. It's not known what causes these to form. The disease may also be caused by a harmful change in a gene that's passed down, or inherited, from one or both parents. Not everyone who gets the changed gene from their parents will get the disease. What increases the risk? Being older than 87 years of age. Being male. Having any of these conditions: High blood pressure. Diabetes. Heart or blood vessel disease. Smoking. Being very overweight. Having a brain injury or a stroke in the past. Having a family history of dementia. What are the signs or symptoms?  Symptoms may happen in three stages, which often overlap. Early stage In this stage, you can still do things on your own. You may still be able to drive, work, and be social. Symptoms in this stage include: Forgetting small things, like a name, words, or what you did recently. Having a hard time with: Paying attention or learning new  things. Talking with people. Doing your usual tasks. Solving problems or doing math. Following instructions. Feeling worried or nervous. Not wanting to be around people. Losing interest in doing things. Moderate stage In this stage, you'll start to need care. Symptoms include: Trouble saying what you're thinking. Memory loss that affects daily life. This can include forgetting: Things that happened recently. If you've taken medicines or eaten. Places you know. You may get lost while walking or driving. To pay bills. To bathe or use the bathroom. Being confused about where you are or what time it is. Trouble judging distance. Changes in how you feel or act. You may be moody, angry, upset, scared, worried, or suspicious. Not thinking clearly or making good choices. You may have delusions or false beliefs. Hallucinations. This means you see, hear, taste, smell, or feel things that aren't real. Severe stage In the severe stage, you'll need help with your  personal care and daily activities. Symptoms include: Memory loss getting worse. Personality changes. Not knowing where you are. Physical problems, like trouble walking, sitting, or swallowing. More trouble talking with others. Not being able to control when you pee (urinate) or poop. More changes in how you act. How is this diagnosed? You may need to see a nervous system specialist, called a neurologist, or a health care provider who focuses on the care of older adults. Alzheimer's disease may be diagnosed based on: Your symptoms and medical history. Your provider will talk with you and your family, friends, and caregivers about your history and symptoms. A physical exam. Tests. These may include: Lab tests, such as tests on your blood or pee. Imaging tests. You may have a CT scan, a PET scan, or an MRI. A lumbar puncture. For this test, a sample of the fluid around the brain and spinal cord is taken and tested. Tests to check  your thinking and memory. Genetic testing. This may be done if you get the disease before age 76 or if other family members have had the disease. How is this treated? At this time, there's no cure for Alzheimer's disease. The goals of treatment are to: Manage symptoms that affect behavior. Make sure you're safe at home. Help manage daily life for you and your caregivers. Treatment may include: Medicines. You may get medicines that can: Slow down how fast the disease gets worse. Help with memory and behavior. Cognitive therapy. This gives you support, training to help with thinking skills, and memory aids. Counseling or spiritual guidance. These can help you deal with the many feelings you may have, such as fear, anger, or feeling alone. Caregivers. These are people who help you with your daily tasks. Family support groups. These allow your family members to learn about the disease, get emotional support, and find out about resources to help take care of you. Follow these instructions at home:  Medicines Take over-the-counter and prescription medicines only as told by your provider. Use a pill organizer or pill reminder to help you keep track of your medicines. Avoid taking medicines that can affect thinking, such as medicines for pain or sleep. Lifestyle Make healthy choices: Be active as told by your provider. Regular exercise may help with symptoms. Do not smoke, vape, or use products with nicotine or tobacco in them. Do not drink alcohol . Eat a healthy diet. When you feel a lot of stress, do something that helps you relax. Try things like yoga or deep breathing. Spend time with people. Drink enough fluid to keep your pee pale yellow. Make sure you sleep well. These tips can help: Try not to take long naps during the day. Take short naps of 30 minutes or less if needed. Keep your bedroom dark and cool. Do not exercise during the few hours before you go to bed. Avoid caffeine  products in the afternoon and evening. General instructions Work with your provider to decide: What things you need help with. What your safety needs are. If you were given a bracelet that tracks where you are and shows that you're a person with memory loss, make sure you wear it at all times. Talk with your provider about whether it's safe for you to drive. Work with your family to make big legal or health decisions. This may include things like advance directives, medical power of attorney, or a living will. Where to find more information There are two ways to contact the Alzheimer's Association: Call  the 24-hour helpline at 531 579 6177. Visit [employer] Contact a health care provider if: Your medicine causes you to have nausea, vomiting, or trouble with eating. You have mood or behavior changes that are getting worse, such as feeling depressed, worried, or nervous. You have hallucinations. You or your family members are worried about your safety. You're hard to wake up. Your memory suddenly gets worse. Get help right away if: You feel like you may hurt yourself or others. You have thoughts about taking your own life. Take one of these steps: Go to your nearest emergency room. Call 911. Call the National Suicide Prevention Lifeline at 786-583-9730 or 988. Text the Crisis Text Line at 402-197-9366. This information is not intended to replace advice given to you by your health care provider. Make sure you discuss any questions you have with your health care provider. Document Revised: 12/27/2022 Document Reviewed: 12/27/2022 Elsevier Patient Education  2024 Elsevier Inc.  No orders of the defined types were placed in this encounter.   Meds ordered this encounter  Medications   memantine  (NAMENDA ) 10 MG tablet    Sig: Take 1 tablet (10 mg total) by mouth 2 (two) times daily.    Dispense:  180 tablet    Refill:  3    Return if symptoms worsen or fail to improve.  I have spent a  total of 45 minutes dedicated to this patient today, preparing to see patient, performing a medically appropriate examination and evaluation, ordering tests and/or medications and procedures, and counseling and educating the patient/family/caregiver; independently interpreting result and communicating results to the family/patient/caregiver; and documenting clinical information in the electronic medical record.   Cassandra Cleveland, MD 02/01/2024, 12:20 PM  [facility] [address] [place], [place] [address] [phone]

## 2024-02-06 ENCOUNTER — Ambulatory Visit (INDEPENDENT_AMBULATORY_CARE_PROVIDER_SITE_OTHER): Payer: Medicare Other | Admitting: Ophthalmology

## 2024-02-06 ENCOUNTER — Encounter (INDEPENDENT_AMBULATORY_CARE_PROVIDER_SITE_OTHER): Payer: Self-pay | Admitting: Ophthalmology

## 2024-02-06 DIAGNOSIS — H35033 Hypertensive retinopathy, bilateral: Secondary | ICD-10-CM | POA: Diagnosis not present

## 2024-02-06 DIAGNOSIS — Z961 Presence of intraocular lens: Secondary | ICD-10-CM

## 2024-02-06 DIAGNOSIS — H353124 Nonexudative age-related macular degeneration, left eye, advanced atrophic with subfoveal involvement: Secondary | ICD-10-CM

## 2024-02-06 DIAGNOSIS — H353211 Exudative age-related macular degeneration, right eye, with active choroidal neovascularization: Secondary | ICD-10-CM

## 2024-02-06 DIAGNOSIS — I10 Essential (primary) hypertension: Secondary | ICD-10-CM

## 2024-02-06 MED ORDER — AFLIBERCEPT 2MG/0.05ML IZ SOLN FOR KALEIDOSCOPE
2.0000 mg | INTRAVITREAL | Status: AC | PRN
  Administered 2024-02-06: 2 mg via INTRAVITREAL

## 2024-02-08 ENCOUNTER — Other Ambulatory Visit: Payer: Self-pay | Admitting: Family Medicine

## 2024-02-08 DIAGNOSIS — J449 Chronic obstructive pulmonary disease, unspecified: Secondary | ICD-10-CM

## 2024-02-19 DIAGNOSIS — K080 Exfoliation of teeth due to systemic causes: Secondary | ICD-10-CM | POA: Diagnosis not present

## 2024-02-26 DIAGNOSIS — J449 Chronic obstructive pulmonary disease, unspecified: Secondary | ICD-10-CM | POA: Diagnosis not present

## 2024-02-26 DIAGNOSIS — J3089 Other allergic rhinitis: Secondary | ICD-10-CM | POA: Diagnosis not present

## 2024-02-29 ENCOUNTER — Encounter: Payer: Self-pay | Admitting: Family Medicine

## 2024-03-12 ENCOUNTER — Ambulatory Visit: Attending: Cardiology

## 2024-03-12 DIAGNOSIS — I4891 Unspecified atrial fibrillation: Secondary | ICD-10-CM | POA: Diagnosis not present

## 2024-03-12 DIAGNOSIS — Z5181 Encounter for therapeutic drug level monitoring: Secondary | ICD-10-CM | POA: Diagnosis not present

## 2024-03-12 LAB — POCT INR: INR: 2.3 (ref 2.0–3.0)

## 2024-03-12 NOTE — Patient Instructions (Signed)
 Description   Continue taking 1 tablet daily except 0.5 tablet on Sunday, Monday, Wednesday and Friday.  Stay consistent with greens (2 times per week)  Recheck INR 6 weeks  [facility] [phone]

## 2024-03-13 DIAGNOSIS — F02B18 Dementia in other diseases classified elsewhere, moderate, with other behavioral disturbance: Secondary | ICD-10-CM | POA: Diagnosis not present

## 2024-03-13 DIAGNOSIS — G301 Alzheimer's disease with late onset: Secondary | ICD-10-CM | POA: Diagnosis not present

## 2024-03-13 DIAGNOSIS — Z9181 History of falling: Secondary | ICD-10-CM | POA: Diagnosis not present

## 2024-03-13 DIAGNOSIS — N1831 Chronic kidney disease, stage 3a: Secondary | ICD-10-CM | POA: Diagnosis not present

## 2024-03-19 ENCOUNTER — Encounter: Payer: Self-pay | Admitting: Pulmonary Disease

## 2024-03-19 ENCOUNTER — Ambulatory Visit: Admitting: Pulmonary Disease

## 2024-03-19 VITALS — BP 177/90 | HR 77 | Ht 72.0 in | Wt 213.0 lb

## 2024-03-19 DIAGNOSIS — J42 Unspecified chronic bronchitis: Secondary | ICD-10-CM

## 2024-03-19 NOTE — Patient Instructions (Addendum)
 VISIT SUMMARY:  Today, you were seen for shortness of breath, which you experience mainly during physical activities like walking and going up hills. You also have a persistent dry cough. We discussed your history of COPD, aortic stenosis, and recent TAVR procedure. Your current medications and overall health were reviewed.  YOUR PLAN:  -CHRONIC BRONCHITIS WITH BRONCHIECTASIS: This condition involves inflammation and widening of the airways, which can cause breathing difficulties and a persistent cough. You should continue using your Trelegy inhaler daily. We will also schedule pulmonary function tests within the next month or two to better understand your lung function and confirm the diagnosis.  INSTRUCTIONS:  Please continue using your Trelegy inhaler daily. We will schedule pulmonary function tests within the next month or two to assess your lung function. Follow up with us  after the tests are completed to discuss the results and any further steps.

## 2024-03-19 NOTE — Progress Notes (Signed)
 Warren Wagner    [id number]    [birth year]  Primary Care Physician:Edstrom, Tarri Farm, PA  Referring Physician: Laneta Pintos, MD [address] [place],  [place] [address]  Chief complaint: Evaluation for dyspnea, COPD  HPI: 89 y.o. 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  Discussed the use of AI scribe software for clinical note transcription with the patient, who gave verbal consent to proceed.  History of Present Illness Warren Wagner. is an 89 year old male with COPD and aortic stenosis status post TAVR, severe MR, macular degeneration of the eye, Alzheimer's, hypertension, hyperlipidemia who presents with shortness of breath. He is accompanied by his daughter.  He experiences shortness of breath primarily during exertion, such as walking and going up hills, but not at rest. He has a persistent non-productive cough. No congestion or wheezing. He uses Trelegy daily, which he has been on for several years, and has a rescue inhaler that he has not used.  He has been diagnosed with COPD, although pulmonary function tests have not been conducted to confirm this diagnosis.  He has significant cardiac history, including aortic stenosis and severe mitral regurgitation, with a TAVR procedure performed in January 2024. He reports no improvement in breathing post-procedure. His activity level is low, primarily watching TV and sleeping, with minimal physical activity.  He also has a history of macular degeneration and a recent diagnosis of Alzheimer's disease. He experiences nasal drip and 'goopy' eyes. No known allergies or acid reflux.   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 No h/o chemo/XRT/amiodarone/macrodantin/MTX  No exposure to silica or other organic allergens  Smoking history: 10-pack-year smoker.  Quit in his 63s Travel history: Originally from [place] .  He has not traveled recently except for a trip to [place]  in March. Relevant family history: No relevant family history of lung disease  Outpatient Encounter Medications as of 03/19/2024  Medication Sig   acetaminophen  (TYLENOL ) 500 MG tablet Take 1,000 mg by mouth every 6 (six) hours as needed for moderate pain or headache.   albuterol  (VENTOLIN  HFA) 108 (90 Base) MCG/ACT inhaler Inhale 2 puffs into the lungs every 6 (six) hours as needed for wheezing or shortness of breath.   EQ ALLERGY RELIEF, CETIRIZINE , 10 MG tablet Take 1 tablet by mouth once daily   Fluticasone -Umeclidin-Vilant (TRELEGY ELLIPTA ) 100-62.5-25 MCG/ACT AEPB INHALE 1 PUFF INTO THE LUNGS  ONCE DAILY   furosemide  (LASIX ) 40 MG tablet Take 1 tablet by mouth once daily   memantine  (NAMENDA ) 10 MG tablet Take 1 tablet (10 mg total) by mouth 2 (two) times daily.   Multiple Vitamins-Minerals (MULTIVITAMIN WITH MINERALS) tablet Take 1 tablet by mouth daily.   Multiple Vitamins-Minerals (OCUVITE PO) Take 1 tablet by mouth daily.   OMEGA-3 FATTY ACIDS PO Take 500 mg by mouth daily.   sodium chloride  (OCEAN) 0.65 % SOLN nasal spray Place 1 spray into both nostrils daily as needed for congestion.   warfarin (COUMADIN ) 5 MG tablet TAKE 1/2 TO 1 (ONE-HALF TO ONE) TABLET BY MOUTH ONCE DAILY AS DIRECTED  BY ANTICOAGULATION CLINIC   finasteride  (PROSCAR ) 5 MG tablet Take 1 tablet by mouth once daily   metoprolol  succinate (TOPROL -XL) 50 MG 24 hr tablet Take 0.5 tablets (25 mg total) by mouth daily. Take with or immediately following a meal. May split current pills in half until bottle is  gone.   No facility-administered encounter medications on file as of 03/19/2024.   Physical Exam: Blood pressure (!) 177/90, pulse 77, height 6' (1.829 m), weight 213 lb (96.6 kg), SpO2 97%. Gen:      No acute distress HEENT:  EOMI, sclera anicteric Neck:     No masses; no thyromegaly Lungs:    Clear to auscultation bilaterally; normal respiratory effort CV:         Regular rate and rhythm; no murmurs Abd:      + bowel sounds; soft, non-tender; no palpable masses, no distension Ext:    No edema; adequate peripheral perfusion Skin:      Warm and dry; no rash Neuro: alert and oriented x 3 Psych: normal mood and affect  Data Reviewed: Imaging: CT chest 02/18/2022-stable dilatation of ascending aorta, mild bronchial wall thickening with mild bronchiectasis in the lower lobes, cardiomegaly, trace right pleural effusion, hepatic cirrhosis.  I reviewed images personally.  PFTs:  Labs: CBC 12/04/2023-WBC 7.8, eos 3%, absolute eosinophil count 234  Assessment & Plan Chronic bronchitis with bronchiectasis Characterized by bronchial inflammation and bronchiectasis noted on a CT scan from a year ago. Presents with exertional dyspnea and a persistent non-productive cough. Differential diagnosis includes COPD, pending confirmation via pulmonary function tests (PFTs). Dyspnea may also be influenced by severe cardiac issues. Current management with Trelegy inhaler is beneficial. - Continue Trelegy inhaler daily - Order PFT within the next month or two to assess lung function and confirm diagnosis  Severe aortic stenosis with TAVR Severe mitral regurgitation Managed with TAVR procedure in January 2024. No significant improvement in respiratory symptoms post-procedure, indicating other contributing factors. Contributing to overall cardiac issues. Managed conservatively with medical management.   Recommendations: Continue Trelegy inhaler Schedule PFTs  Phyllis Breeze MD [facility] 03/19/2024, 8:56 AM  CC: Laneta Pintos, MD

## 2024-03-24 NOTE — Progress Notes (Signed)
  Cardiology Office Note:   Date:  03/26/2024  ID:  Warren Wagner., DOB 05/16/1935, MRN [id number] PCP: Warren Joesph LABOR, PA  [facility] Providers Cardiologist:  Lynwood Schilling, MD {  History of Present Illness:   Warren Wagner. is a 89 y.o. male who presents for  89 y.o. male who presents for evaluation of lower extremity swelling.  He has atrial fib and has been on warfarin.  LV function is OK.   There were no significant valvular abnormalities.  He has aortic root enlargement.  He was in the hospital in April 2023 with dizziness and orthostatic hypotension.    He had rhabdo and dehydration.  He had a UTI.  He was found to have severe AS and TAVR.   He has severe MR.   It was decided to manage him medically because of his multiple comorbid issues.  The patient has had no new cardiovascular symptoms.  He has chronic dyspnea and is actually scheduled to get pulmonary function testing.  However, his daughter who is with him today says he does not appear to struggle for breath.  He is very deconditioned and walks very slowly with a walker.  He is not having however any resting shortness of breath, PND or orthopnea.  When he was in the hospital a few years ago they went home with home O2 but did not eventually have to use it.  He has been taken off of this.  ROS: As stated in the HPI and negative for all other systems.  Studies Reviewed:    EKG:     NA  Risk Assessment/Calculations:              Physical Exam:   VS:  BP 134/78   Pulse 64   Ht 6' (1.829 m)   Wt 215 lb (97.5 kg)   SpO2 95%   BMI 29.16 kg/m    Wt Readings from Last 3 Encounters:  03/26/24 215 lb (97.5 kg)  03/19/24 213 lb (96.6 kg)  02/01/24 218 lb (98.9 kg)     GEN: Well nourished, well developed in no acute distress NECK: No JVD; No carotid bruits CARDIAC: Irregular RR, 3 out of 6 apical systolic murmur early peaking radiating slightly at the aortic upper tract, no diastolic murmurs, rubs,  gallops RESPIRATORY:  Clear to auscultation without rales, wheezing or rhonchi  ABDOMEN: Soft, non-tender, non-distended EXTREMITIES:  No edema; No deformity   ASSESSMENT AND PLAN:   Severe AS s/p TAVR:   This was stable in Dec 2024.  No change in therapy.  Severe mitral regurgitation:   Because of his comorbidities we are going to manage this medically.  I had this conversation with the patient and his daughter and they both agree.  No change in therapy.  Permanent afib:   He tolerates anticoagulation.  He has good rate control.  He does not really feel his fibrillation.  No change in therapy.   HTN: BP is at target.  No change in therapy.  TAA: He would not be a candidate for repair either percutaneously or otherwise and so no further measuring.  SOB: Today we did measure his oxygen  as he was walking out of the office. His sats stayed in the low to mid 90s.     Follow up with me in one year.   Signed, Lynwood Schilling, MD

## 2024-03-26 ENCOUNTER — Encounter: Payer: Self-pay | Admitting: Cardiology

## 2024-03-26 ENCOUNTER — Ambulatory Visit: Payer: Medicare Other | Attending: Cardiology | Admitting: Cardiology

## 2024-03-26 VITALS — BP 134/78 | HR 64 | Ht 72.0 in | Wt 215.0 lb

## 2024-03-26 DIAGNOSIS — Z952 Presence of prosthetic heart valve: Secondary | ICD-10-CM | POA: Diagnosis not present

## 2024-03-26 DIAGNOSIS — I4821 Permanent atrial fibrillation: Secondary | ICD-10-CM

## 2024-03-26 DIAGNOSIS — I10 Essential (primary) hypertension: Secondary | ICD-10-CM | POA: Diagnosis not present

## 2024-03-26 DIAGNOSIS — I340 Nonrheumatic mitral (valve) insufficiency: Secondary | ICD-10-CM

## 2024-03-26 MED ORDER — METOPROLOL SUCCINATE ER 50 MG PO TB24: 25.0000 mg | ORAL_TABLET | Freq: Every day | ORAL | 3 refills | Status: AC

## 2024-03-26 NOTE — Patient Instructions (Signed)

## 2024-03-28 DIAGNOSIS — J3089 Other allergic rhinitis: Secondary | ICD-10-CM | POA: Diagnosis not present

## 2024-03-28 DIAGNOSIS — J449 Chronic obstructive pulmonary disease, unspecified: Secondary | ICD-10-CM | POA: Diagnosis not present

## 2024-04-09 DIAGNOSIS — I10 Essential (primary) hypertension: Secondary | ICD-10-CM | POA: Diagnosis not present

## 2024-04-09 DIAGNOSIS — I4821 Permanent atrial fibrillation: Secondary | ICD-10-CM | POA: Diagnosis not present

## 2024-04-09 DIAGNOSIS — I872 Venous insufficiency (chronic) (peripheral): Secondary | ICD-10-CM | POA: Diagnosis not present

## 2024-04-09 DIAGNOSIS — Z9181 History of falling: Secondary | ICD-10-CM | POA: Diagnosis not present

## 2024-04-15 NOTE — Progress Notes (Signed)
 [facility] - Clinic Note  04/16/2024     CHIEF COMPLAINT Patient presents for Retina Follow Up   HISTORY OF PRESENT ILLNESS: Warren Wagner. is a 89 y.o. male who presents to the clinic today for:   HPI     Retina Follow Up   Patient presents with  Wet AMD.  In both eyes.  This started 10 weeks ago.  Duration of 10 weeks.  Since onset it is stable.  I, the attending physician,  performed the HPI with the patient and updated documentation appropriately.        Comments   10 week retina follow up ARMD and I'VE OD pt is reporting no vision changes noticed he denies any flashes or floaters       Last edited by Valdemar Rogue, MD on 04/16/2024 12:26 PM.    Pt states vision is stable  Referring physician: Wallace Joesph LABOR, PA [address] [place],  [place] [address]  HISTORICAL INFORMATION:   Selected notes from the MEDICAL RECORD NUMBER [name] transferring care due to insurance LEE: 03.27.23 [BCVA 20/40 OD, CF OS] Ocular Hx- ex ARMD OU; last injection was IVA OS on 05.24.21  PMH-    CURRENT MEDICATIONS: No current outpatient medications on file. (Ophthalmic Drugs)   No current facility-administered medications for this visit. (Ophthalmic Drugs)   Current Outpatient Medications (Other)  Medication Sig   acetaminophen  (TYLENOL ) 500 MG tablet Take 1,000 mg by mouth every 6 (six) hours as needed for moderate pain or headache.   albuterol  (VENTOLIN  HFA) 108 (90 Base) MCG/ACT inhaler Inhale 2 puffs into the lungs every 6 (six) hours as needed for wheezing or shortness of breath.   EQ ALLERGY RELIEF, CETIRIZINE , 10 MG tablet Take 1 tablet by mouth once daily   finasteride  (PROSCAR ) 5 MG tablet Take 1 tablet by mouth once daily   Fluticasone -Umeclidin-Vilant (TRELEGY ELLIPTA ) 100-62.5-25 MCG/ACT AEPB INHALE 1 PUFF INTO THE LUNGS  ONCE DAILY   furosemide  (LASIX ) 40 MG tablet Take 1 tablet by mouth once daily   memantine  (NAMENDA ) 10 MG tablet Take 1  tablet (10 mg total) by mouth 2 (two) times daily.   metoprolol  succinate (TOPROL -XL) 50 MG 24 hr tablet Take 0.5 tablets (25 mg total) by mouth daily. Take with or immediately following a meal. May split current pills in half until bottle is gone.   Multiple Vitamins-Minerals (MULTIVITAMIN WITH MINERALS) tablet Take 1 tablet by mouth daily.   Multiple Vitamins-Minerals (OCUVITE PO) Take 1 tablet by mouth daily.   OMEGA-3 FATTY ACIDS PO Take 500 mg by mouth daily.   sodium chloride  (OCEAN) 0.65 % SOLN nasal spray Place 1 spray into both nostrils daily as needed for congestion.   warfarin (COUMADIN ) 5 MG tablet TAKE 1/2 TO 1 (ONE-HALF TO ONE) TABLET BY MOUTH ONCE DAILY AS DIRECTED BY ANTICOAGULATION CLINIC   No current facility-administered medications for this visit. (Other)   REVIEW OF SYSTEMS: ROS   Positive for: Cardiovascular, Eyes Negative for: Constitutional, Gastrointestinal, Neurological, Skin, Genitourinary, Musculoskeletal, HENT, Endocrine, Respiratory, Psychiatric, Allergic/Imm, Heme/Lymph Last edited by Resa Delon LELON, COT on 04/16/2024  8:58 AM.      ALLERGIES No Known Allergies  PAST MEDICAL HISTORY Past Medical History:  Diagnosis Date   Atrial fibrillation, persistent (HCC)    COPD (chronic obstructive pulmonary disease) (HCC)    Dementia (HCC)    Hearing loss    Hypertension    Prostate enlargement    S/P TAVR (  transcatheter aortic valve replacement) 10/18/2022   s/p TAVR with a 29 mm Edwards S3UR via the TF approach by Dr. Verlin & Dr. Lucas   Severe aortic stenosis    Umbilical hernia 07/27/2012   Past Surgical History:  Procedure Laterality Date   CARDIOVERSION  10/10/2006   cataracts Bilateral    HERNIA REPAIR  09/10/2012   large umbilical hernia   INSERTION OF MESH  09/10/2012   Procedure: INSERTION OF MESH;  Surgeon: Dann FORBES Hummer, MD;  Location: [facility] OR;  Service: General;  Laterality: N/A;   INTRAOPERATIVE TRANSTHORACIC ECHOCARDIOGRAM N/A  10/18/2022   Procedure: INTRAOPERATIVE TRANSTHORACIC ECHOCARDIOGRAM;  Surgeon: Verlin Lonni BIRCH, MD;  Location: [facility] INVASIVE CV LAB;  Service: Open Heart Surgery;  Laterality: N/A;   macular degeneration Bilateral    RIGHT HEART CATH AND CORONARY ANGIOGRAPHY N/A 09/09/2022   Procedure: RIGHT HEART CATH AND CORONARY ANGIOGRAPHY;  Surgeon: Verlin Lonni BIRCH, MD;  Location: [facility] INVASIVE CV LAB;  Service: Cardiovascular;  Laterality: N/A;   TONSILLECTOMY     maybe (09/10/2012)   TRANSCATHETER AORTIC VALVE REPLACEMENT, TRANSFEMORAL N/A 10/18/2022   Procedure: Transcatheter Aortic Valve Replacement, Transfemoral;  Surgeon: Verlin Lonni BIRCH, MD;  Location: [facility] INVASIVE CV LAB;  Service: Open Heart Surgery;  Laterality: N/A;   UMBILICAL HERNIA REPAIR  09/10/2012   Procedure: HERNIA REPAIR UMBILICAL ADULT;  Surgeon: Dann FORBES Hummer, MD;  Location: [facility] OR;  Service: General;  Laterality: N/A;   FAMILY HISTORY Family History  Problem Relation Age of Onset   Heart disease Mother    Stroke Father    Alzheimer's disease Neg Hx    Dementia Neg Hx    SOCIAL HISTORY Social History   Tobacco Use   Smoking status: Former    Current packs/day: 0.00    Average packs/day: 1.5 packs/day for 10.0 years (15.0 ttl pk-yrs)    Types: Cigarettes    Start date: 07/27/1972    Quit date: 07/27/1982    Years since quitting: 41.7   Smokeless tobacco: Never  Vaping Use   Vaping status: Never Used  Substance Use Topics   Alcohol  use: Not Currently   Drug use: No       OPHTHALMIC EXAM:  Base Eye Exam     Visual Acuity (Snellen - Linear)       Right Left   Dist [place] 20/200 CF at 3'   Dist ph [place] NI NI         Tonometry (Tonopen, 9:03 AM)       Right Left   Pressure 12 13         Pupils       Pupils Dark Light Shape React APD   Right PERRL 2 1 Round Brisk None   Left PERRL 2 1 Round Brisk None         Visual Fields       Left Right    Full Full         Extraocular  Movement       Right Left    Full, Ortho Full, Ortho         Neuro/Psych     Oriented x3: Yes   Mood/Affect: Normal         Dilation     Both eyes: 2.5% Phenylephrine  @ 9:03 AM           Slit Lamp and Fundus Exam     External Exam       Right Left   External  Normal Normal         Slit Lamp Exam       Right Left   Lids/Lashes Dermatochalasis - upper lid, Ptosis Dermatochalasis - upper lid, mild MGD   Conjunctiva/Sclera White and quiet White and quiet   Cornea 1-2+ Punctate epithelial erosions, mild arcus, well healed cataract wound, tear film debris Trace tear film debris, well healed cataract wound, arcus   Anterior Chamber deep and clear deep and clear   Iris Round and dilated round and poorly dilated   Lens PC IOL in good position, 1-2+ Posterior capsular opacification PC IOL in good position, 2-3+ Posterior capsular opacification   Anterior Vitreous mild syneresis Vitreous syneresis         Fundus Exam       Right Left   Posterior Vitreous Posterior vitreous detachment Posterior vitreous detachment, vitreous condensations   Disc 2+Pallor, Sharp rim, Compact 2+Pallor, Sharp rim   C/D Ratio 0.2 0.3   Macula Blunted foveal reflex, refractile drusen, RPE mottling, clumping and atrophy, focal central edema -- stably improved, no frank heme Flat, blunted foveal reflex, refractile drusen, RPE mottling, clumping and atrophy, central GA, no frank heme   Vessels attenuated, Tortuous attenuated, Tortuous   Periphery Attached, reticular degeneration, no heme Attached, reticlar degeneration, no heme           IMAGING AND PROCEDURES  Imaging and Procedures for 04/16/2024  OCT, Retina - OU - Both Eyes       Right Eye Quality was good. Scan locations included subfoveal. Central Foveal Thickness: 196. Progression has been stable. Findings include normal foveal contour, no IRF, retinal drusen , subretinal hyper-reflective material, pigment epithelial detachment,  subretinal fluid, outer retinal atrophy (Patchy central ORA with PEDs, stable improvement in central pockets of SRF).   Left Eye Quality was poor. Scan locations included subfoveal. Central Foveal Thickness: 279. Progression has been stable. Findings include no IRF, no SRF, abnormal foveal contour, retinal drusen , outer retinal tubulation, pigment epithelial detachment, outer retinal atrophy (central ORA / GA with PEDs and ORT).   Notes *Images captured and stored on drive  Diagnosis / Impression:  OD: Patchy central ORA with PEDs, stable improvement in central pockets of SRF OS: non exudative ARMD - central ORA / GA with PEDs and ORT  Clinical management:  See below  Abbreviations: NFP - Normal foveal profile. CME - cystoid macular edema. PED - pigment epithelial detachment. IRF - intraretinal fluid. SRF - subretinal fluid. EZ - ellipsoid zone. ERM - epiretinal membrane. ORA - outer retinal atrophy. ORT - outer retinal tubulation. SRHM - subretinal hyper-reflective material. IRHM - intraretinal hyper-reflective material      Intravitreal Injection, Pharmacologic Agent - OD - Right Eye       Time Out 04/16/2024. 10:07 AM. Confirmed correct patient, procedure, site, and patient consented.   Anesthesia Topical anesthesia was used. Anesthetic medications included Lidocaine  2%, Proparacaine 0.5%.   Procedure Preparation included 5% betadine to ocular surface, eyelid speculum. A (32g) needle was used.   Injection: 2 mg aflibercept  2 MG/0.05ML   Route: Intravitreal, Site: Right Eye   NDC: [id number], Lot: [id number], Expiration date: 07/09/2025, Waste: 0 mL   Post-op Post injection exam found visual acuity of at least counting fingers. The patient tolerated the procedure well. There were no complications. The patient received written and verbal post procedure care education.             ASSESSMENT/PLAN:    ICD-10-CM   1. Exudative age-related  macular degeneration of right  eye with active choroidal neovascularization (HCC)  H35.3211 OCT, Retina - OU - Both Eyes    Intravitreal Injection, Pharmacologic Agent - OD - Right Eye    aflibercept  (EYLEA ) SOLN 2 mg    2. 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 Essential hypertension  I10     4. Hypertensive retinopathy of both eyes  H35.033     5. Pseudophakia, both eyes  Z96.1       Exudative age related macular degeneration, OD  - previous Dr. Elner pt here due to insurance  - pt reports history of injections OD w/ [name], but unable to see record of that - here s/p IVA OD #1 (02.07.24), #2 (03.13.24), #3 (04.17.24), #4 (05.31.24) -- IVA resistance  - s/p IVE OD #1 (06.28.24), #2 (07.29.24), #3 (08.26.24), #4 (09.30.24), #5 (11.11.24), #6 (12.30.24), #7 (02.25.25), #8 (04.29.25)  - BCVA OD 20/200 from 20/70  - OCT OD: Patchy central ORA with PEDs, stable improvement in central pockets of SRF at 10 weeks  - recommend IVE OD #9 today, 07.08.25 w/ f/u up in 10 wks  - Good Days unavailable -- pt covering 20% coinsurance for medication - pt wishes to proceed with injection - RBA of procedure discussed, questions answered - IVE informed consent obtained and signed, 06.28.24 (OD) - see procedure note   - f/u in 10 wks -- DFE/OCT, possible injection  2. Age related macular degeneration, non-exudative, left eye  - advanced stage w/ significant central GA  - BCVA CF 3' -- stable   - history of IVA OS w/ [name], last 05.24.21  - OCT OS shows Central ORA / GA with PEDs and ORT  - Recommend amsler grid monitoring  - monitor  3,4. Hypertensive retinopathy OU - discussed importance of tight BP control - monitor  5. Pseudophakia OU  - s/p CE/IOL (Dr. Roz)  - IOL in good position, doing well  - monitor  Ophthalmic Meds Ordered this visit:  Meds ordered this encounter  Medications   aflibercept  (EYLEA ) SOLN 2 mg     Return  in about 10 weeks (around 06/25/2024) for exu ARMD OD, DFE, OCT.  There are no Patient Instructions on file for this visit.  This document serves as a record of services personally performed by Redell JUDITHANN Hans, MD, PhD. It was created on their behalf by Auston Muzzy, COMT. The creation of this record is the provider's dictation and/or activities during the visit.  Electronically signed by: Auston Muzzy, COMT 04/16/24 12:27 PM  Redell JUDITHANN Hans, M.D., Ph.D. Diseases & Surgery of the Retina and Vitreous [facility]  I have reviewed the above documentation for accuracy and completeness, and I agree with the above. Redell JUDITHANN Hans, M.D., Ph.D. 04/16/24 12:28 PM   Abbreviations: M myopia (nearsighted); A astigmatism; H hyperopia (farsighted); P presbyopia; Mrx spectacle prescription;  CTL contact lenses; OD right eye; OS left eye; OU both eyes  XT exotropia; ET esotropia; PEK punctate epithelial keratitis; PEE punctate epithelial erosions; DES dry eye syndrome; MGD meibomian gland dysfunction; ATs artificial tears; PFAT's preservative free artificial tears; NSC nuclear sclerotic cataract; PSC posterior subcapsular cataract; ERM epi-retinal membrane; PVD posterior vitreous detachment; RD retinal detachment; DM diabetes mellitus; DR diabetic retinopathy; NPDR non-proliferative diabetic retinopathy; PDR proliferative diabetic retinopathy; CSME clinically significant macular edema; DME diabetic macular edema; dbh dot blot hemorrhages; CWS cotton wool spot; POAG  primary open angle glaucoma; C/D cup-to-disc ratio; HVF humphrey visual field; GVF goldmann visual field; OCT optical coherence tomography; IOP intraocular pressure; BRVO Branch retinal vein occlusion; CRVO central retinal vein occlusion; CRAO central retinal artery occlusion; BRAO branch retinal artery occlusion; RT retinal tear; SB scleral buckle; PPV pars plana vitrectomy; VH Vitreous hemorrhage; PRP panretinal laser  photocoagulation; IVK intravitreal kenalog; VMT vitreomacular traction; MH Macular hole;  NVD neovascularization of the disc; NVE neovascularization elsewhere; AREDS age related eye disease study; ARMD age related macular degeneration; POAG primary open angle glaucoma; EBMD epithelial/anterior basement membrane dystrophy; ACIOL anterior chamber intraocular lens; IOL intraocular lens; PCIOL posterior chamber intraocular lens; Phaco/IOL phacoemulsification with intraocular lens placement; PRK photorefractive keratectomy; LASIK laser assisted in situ keratomileusis; HTN hypertension; DM diabetes mellitus; COPD chronic obstructive pulmonary disease

## 2024-04-16 ENCOUNTER — Ambulatory Visit (INDEPENDENT_AMBULATORY_CARE_PROVIDER_SITE_OTHER): Admitting: Ophthalmology

## 2024-04-16 ENCOUNTER — Encounter (INDEPENDENT_AMBULATORY_CARE_PROVIDER_SITE_OTHER): Payer: Self-pay | Admitting: Ophthalmology

## 2024-04-16 DIAGNOSIS — H353124 Nonexudative age-related macular degeneration, left eye, advanced atrophic with subfoveal involvement: Secondary | ICD-10-CM | POA: Diagnosis not present

## 2024-04-16 DIAGNOSIS — H35033 Hypertensive retinopathy, bilateral: Secondary | ICD-10-CM

## 2024-04-16 DIAGNOSIS — I10 Essential (primary) hypertension: Secondary | ICD-10-CM | POA: Diagnosis not present

## 2024-04-16 DIAGNOSIS — H353211 Exudative age-related macular degeneration, right eye, with active choroidal neovascularization: Secondary | ICD-10-CM

## 2024-04-16 DIAGNOSIS — Z961 Presence of intraocular lens: Secondary | ICD-10-CM

## 2024-04-16 MED ORDER — AFLIBERCEPT 2MG/0.05ML IZ SOLN FOR KALEIDOSCOPE
2.0000 mg | INTRAVITREAL | Status: AC | PRN
  Administered 2024-04-16: 2 mg via INTRAVITREAL

## 2024-04-23 ENCOUNTER — Ambulatory Visit: Attending: Cardiology

## 2024-04-23 DIAGNOSIS — Z5181 Encounter for therapeutic drug level monitoring: Secondary | ICD-10-CM | POA: Diagnosis not present

## 2024-04-23 DIAGNOSIS — I4891 Unspecified atrial fibrillation: Secondary | ICD-10-CM

## 2024-04-23 LAB — POCT INR: INR: 2.3 (ref 2.0–3.0)

## 2024-04-23 NOTE — Patient Instructions (Signed)
 Description   Continue taking 1 tablet daily except 0.5 tablet on Sunday, Monday, Wednesday and Friday.  Stay consistent with greens (2 times per week)  Recheck INR 6 weeks  [facility] [phone]

## 2024-04-23 NOTE — Progress Notes (Signed)
Please see anticoagulation encounter.

## 2024-04-27 DIAGNOSIS — J449 Chronic obstructive pulmonary disease, unspecified: Secondary | ICD-10-CM | POA: Diagnosis not present

## 2024-04-27 DIAGNOSIS — J3089 Other allergic rhinitis: Secondary | ICD-10-CM | POA: Diagnosis not present

## 2024-04-30 ENCOUNTER — Ambulatory Visit

## 2024-04-30 DIAGNOSIS — J42 Unspecified chronic bronchitis: Secondary | ICD-10-CM | POA: Diagnosis not present

## 2024-04-30 LAB — PULMONARY FUNCTION TEST
DL/VA % pred: 120 %
DL/VA: 4.55 ml/min/mmHg/L
DLCO unc % pred: 96 %
DLCO unc: 23.98 ml/min/mmHg
FEF 25-75 Post: 1.59 L/s
FEF 25-75 Pre: 2.2 L/s
FEF2575-%Change-Post: -27 %
FEF2575-%Pred-Post: 87 %
FEF2575-%Pred-Pre: 121 %
FEV1-%Change-Post: -5 %
FEV1-%Pred-Post: 79 %
FEV1-%Pred-Pre: 84 %
FEV1-Post: 2.26 L
FEV1-Pre: 2.39 L
FEV1FVC-%Change-Post: 10 %
FEV1FVC-%Pred-Pre: 112 %
FEV6-%Change-Post: -14 %
FEV6-%Pred-Post: 69 %
FEV6-%Pred-Pre: 80 %
FEV6-Post: 2.61 L
FEV6-Pre: 3.04 L
FEV6FVC-%Change-Post: 0 %
FEV6FVC-%Pred-Post: 107 %
FEV6FVC-%Pred-Pre: 107 %
FVC-%Change-Post: -14 %
FVC-%Pred-Post: 64 %
FVC-%Pred-Pre: 74 %
FVC-Post: 2.61 L
FVC-Pre: 3.04 L
Post FEV1/FVC ratio: 87 %
Post FEV6/FVC ratio: 100 %
Pre FEV1/FVC ratio: 79 %
Pre FEV6/FVC Ratio: 100 %
RV % pred: 99 %
RV: 2.89 L
TLC % pred: 79 %
TLC: 5.92 L

## 2024-04-30 NOTE — Progress Notes (Signed)
 Full pft performed today.

## 2024-04-30 NOTE — Patient Instructions (Signed)
 Full pft performed today.

## 2024-05-06 DIAGNOSIS — L239 Allergic contact dermatitis, unspecified cause: Secondary | ICD-10-CM | POA: Diagnosis not present

## 2024-05-06 DIAGNOSIS — L570 Actinic keratosis: Secondary | ICD-10-CM | POA: Diagnosis not present

## 2024-05-06 DIAGNOSIS — L821 Other seborrheic keratosis: Secondary | ICD-10-CM | POA: Diagnosis not present

## 2024-05-06 DIAGNOSIS — D225 Melanocytic nevi of trunk: Secondary | ICD-10-CM | POA: Diagnosis not present

## 2024-05-06 DIAGNOSIS — L814 Other melanin hyperpigmentation: Secondary | ICD-10-CM | POA: Diagnosis not present

## 2024-05-07 ENCOUNTER — Other Ambulatory Visit: Payer: Self-pay

## 2024-05-07 DIAGNOSIS — J3089 Other allergic rhinitis: Secondary | ICD-10-CM

## 2024-05-07 MED ORDER — CETIRIZINE HCL 10 MG PO TABS: 10.0000 mg | ORAL_TABLET | Freq: Every day | ORAL | 1 refills | Status: AC

## 2024-05-08 ENCOUNTER — Ambulatory Visit: Payer: Self-pay | Admitting: Pulmonary Disease

## 2024-05-15 ENCOUNTER — Other Ambulatory Visit: Payer: Self-pay | Admitting: Cardiology

## 2024-05-15 DIAGNOSIS — I4891 Unspecified atrial fibrillation: Secondary | ICD-10-CM

## 2024-05-15 NOTE — Telephone Encounter (Signed)
 Prescription refill request received for warfarin Lov: 03/26/24 (Hochrein)  Next INR check: 06/04/24 Warfarin tablet strength: 5mg   Appropriate dose. Refill sent.

## 2024-05-24 DIAGNOSIS — C44311 Basal cell carcinoma of skin of nose: Secondary | ICD-10-CM | POA: Diagnosis not present

## 2024-05-27 ENCOUNTER — Telehealth: Payer: Self-pay | Admitting: *Deleted

## 2024-05-27 NOTE — Telephone Encounter (Signed)
 Daughter called and stated the patient started doxy 100mg  bid on 05/24/24. Advised this will interact with warfarin and will need to have INR checked soon. Scheduled an appt tomorrow in [place] and advised to have a leafy vegetable today.

## 2024-05-28 ENCOUNTER — Ambulatory Visit: Attending: Cardiology

## 2024-05-28 DIAGNOSIS — J449 Chronic obstructive pulmonary disease, unspecified: Secondary | ICD-10-CM | POA: Diagnosis not present

## 2024-05-28 DIAGNOSIS — J3089 Other allergic rhinitis: Secondary | ICD-10-CM | POA: Diagnosis not present

## 2024-05-28 DIAGNOSIS — I4891 Unspecified atrial fibrillation: Secondary | ICD-10-CM

## 2024-05-28 DIAGNOSIS — Z5181 Encounter for therapeutic drug level monitoring: Secondary | ICD-10-CM

## 2024-05-28 LAB — POCT INR: INR: 3 (ref 2.0–3.0)

## 2024-05-28 NOTE — Progress Notes (Signed)
 INR 3.0; Please see anticoagulation encounter

## 2024-05-28 NOTE — Patient Instructions (Signed)
 Description   Only take 1/2 tablets today and then continue taking 1 tablet daily except 0.5 tablet on Sunday, Monday, Wednesday and Friday.  Stay consistent with greens (2 times per week)  Recheck INR 1 week - ON DOXY  Coumadin  Clinic [phone]

## 2024-06-04 ENCOUNTER — Ambulatory Visit: Attending: Cardiology

## 2024-06-04 DIAGNOSIS — I4891 Unspecified atrial fibrillation: Secondary | ICD-10-CM

## 2024-06-04 DIAGNOSIS — Z5181 Encounter for therapeutic drug level monitoring: Secondary | ICD-10-CM

## 2024-06-04 DIAGNOSIS — I4821 Permanent atrial fibrillation: Secondary | ICD-10-CM

## 2024-06-04 DIAGNOSIS — Z7901 Long term (current) use of anticoagulants: Secondary | ICD-10-CM

## 2024-06-04 LAB — POCT INR: INR: 3.5 — AB (ref 2.0–3.0)

## 2024-06-04 NOTE — Patient Instructions (Signed)
 Hold Wednesday and Thursday then continue taking 1 tablet daily except 0.5 tablet on Sunday, Monday, Wednesday and Friday. INR in 3 weeks Stay consistent with greens (2 times per week)   Coumadin  Clinic [phone]

## 2024-06-11 NOTE — Progress Notes (Signed)
 [facility] - Clinic Note  06/25/2024     CHIEF COMPLAINT Patient presents for Retina Follow Up   HISTORY OF PRESENT ILLNESS: Warren Wagner. is a 89 y.o. male who presents to the clinic today for:   HPI     Retina Follow Up   Patient presents with  Wet AMD.  In both eyes.  This started 10 weeks ago.  Duration of 10 weeks.  Since onset it is stable.  I, the attending physician,  performed the HPI with the patient and updated documentation appropriately.        Comments   10 week retina follow up ARMD PD and I'VE OD  pt is reporting no vision changes noticed pt states he has had some flashes unsure what eye       Last edited by Valdemar Rogue, MD on 07/02/2024  6:13 PM.     Pt states VA is stable. Has had some skin surgical procedures on ears and nose.   Referring physician: Wallace Joesph LABOR, PA [address] [place],  [place] [address]  HISTORICAL INFORMATION:   Selected notes from the MEDICAL RECORD NUMBER [name] transferring care due to insurance LEE: 03.27.23 [BCVA 20/40 OD, CF OS] Ocular Hx- ex ARMD OU; last injection was IVA OS on 05.24.21  PMH-    CURRENT MEDICATIONS: No current outpatient medications on file. (Ophthalmic Drugs)   No current facility-administered medications for this visit. (Ophthalmic Drugs)   Current Outpatient Medications (Other)  Medication Sig   acetaminophen  (TYLENOL ) 500 MG tablet Take 1,000 mg by mouth every 6 (six) hours as needed for moderate pain or headache.   albuterol  (VENTOLIN  HFA) 108 (90 Base) MCG/ACT inhaler Inhale 2 puffs into the lungs every 6 (six) hours as needed for wheezing or shortness of breath.   cetirizine  (EQ ALLERGY RELIEF, CETIRIZINE ,) 10 MG tablet Take 1 tablet (10 mg total) by mouth daily.   Fluticasone -Umeclidin-Vilant (TRELEGY ELLIPTA ) 100-62.5-25 MCG/ACT AEPB INHALE 1 PUFF INTO THE LUNGS  ONCE DAILY   furosemide  (LASIX ) 40 MG tablet Take 1 tablet by mouth once daily   memantine   (NAMENDA ) 10 MG tablet Take 1 tablet (10 mg total) by mouth 2 (two) times daily.   metoprolol  succinate (TOPROL -XL) 50 MG 24 hr tablet Take 0.5 tablets (25 mg total) by mouth daily. Take with or immediately following a meal. May split current pills in half until bottle is gone.   Multiple Vitamins-Minerals (MULTIVITAMIN WITH MINERALS) tablet Take 1 tablet by mouth daily.   Multiple Vitamins-Minerals (OCUVITE PO) Take 1 tablet by mouth daily.   OMEGA-3 FATTY ACIDS PO Take 500 mg by mouth daily.   sodium chloride  (OCEAN) 0.65 % SOLN nasal spray Place 1 spray into both nostrils daily as needed for congestion.   warfarin (COUMADIN ) 5 MG tablet TAKE 1/2 TO 1 (ONE-HALF TO ONE) TABLET BY MOUTH ONCE DAILY AS DIRECTED BY ANTICOAGULATION CLINIC   finasteride  (PROSCAR ) 5 MG tablet Take 1 tablet by mouth once daily   No current facility-administered medications for this visit. (Other)   REVIEW OF SYSTEMS: ROS   Positive for: Cardiovascular, Eyes Negative for: Constitutional, Gastrointestinal, Neurological, Skin, Genitourinary, Musculoskeletal, HENT, Endocrine, Respiratory, Psychiatric, Allergic/Imm, Heme/Lymph Last edited by Resa Delon LELON, COT on 06/25/2024  8:38 AM.       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 Verlin & Dr. Lucas   Severe aortic stenosis    Umbilical hernia 07/27/2012   Past Surgical History:  Procedure Laterality Date   CARDIOVERSION  10/10/2006   cataracts Bilateral    HERNIA REPAIR  09/10/2012   large umbilical hernia   INSERTION OF MESH  09/10/2012   Procedure: INSERTION OF MESH;  Surgeon: Dann FORBES Hummer, MD;   Location: [facility] OR;  Service: General;  Laterality: N/A;   INTRAOPERATIVE TRANSTHORACIC ECHOCARDIOGRAM N/A 10/18/2022   Procedure: INTRAOPERATIVE TRANSTHORACIC ECHOCARDIOGRAM;  Surgeon: Verlin Lonni BIRCH, MD;  Location: [facility] INVASIVE CV LAB;  Service: Open Heart Surgery;  Laterality: N/A;   macular degeneration Bilateral    RIGHT HEART CATH AND CORONARY ANGIOGRAPHY N/A 09/09/2022   Procedure: RIGHT HEART CATH AND CORONARY ANGIOGRAPHY;  Surgeon: Verlin Lonni BIRCH, MD;  Location: [facility] INVASIVE CV LAB;  Service: Cardiovascular;  Laterality: N/A;   TONSILLECTOMY     maybe (09/10/2012)   TRANSCATHETER AORTIC VALVE REPLACEMENT, TRANSFEMORAL N/A 10/18/2022   Procedure: Transcatheter Aortic Valve Replacement, Transfemoral;  Surgeon: Verlin Lonni BIRCH, MD;  Location: [facility] INVASIVE CV LAB;  Service: Open Heart Surgery;  Laterality: N/A;   UMBILICAL HERNIA REPAIR  09/10/2012   Procedure: HERNIA REPAIR UMBILICAL ADULT;  Surgeon: Dann FORBES Hummer, MD;  Location: [facility] OR;  Service: General;  Laterality: N/A;   FAMILY HISTORY Family History  Problem Relation Age of Onset   Heart disease Mother    Stroke Father    Alzheimer's disease Neg Hx    Dementia Neg Hx    SOCIAL HISTORY Social History   Tobacco Use   Smoking status: Former    Current packs/day: 0.00    Average packs/day: 1.5 packs/day for 10.0 years (15.0 ttl pk-yrs)    Types: Cigarettes    Start date: 07/27/1972    Quit date: 07/27/1982    Years since quitting: 41.9   Smokeless tobacco: Never  Vaping Use   Vaping status: Never Used  Substance Use Topics   Alcohol  use: Not Currently   Drug use: No       OPHTHALMIC EXAM:  Base Eye Exam     Visual Acuity (Snellen - Linear)       Right Left   Dist [place] 20/150 -3 CF at 3'   Dist ph [place] 20/80 -2          Tonometry (Tonopen, 8:45 AM)       Right Left   Pressure 14 15         Pupils       Pupils Dark Light Shape React APD   Right PERRL 2 1 Round Sluggish None   Left  PERRL 2 1 Round Sluggish None         Visual Fields       Left Right    Full Full         Extraocular Movement       Right Left    Full, Ortho Full, Ortho         Neuro/Psych     Oriented x3: Yes   Mood/Affect: Normal         Dilation     Both eyes: 2.5% Phenylephrine  @ 8:45 AM  Slit Lamp and Fundus Exam     External Exam       Right Left   External Normal Normal         Slit Lamp Exam       Right Left   Lids/Lashes Dermatochalasis - upper lid, Ptosis Dermatochalasis - upper lid, mild MGD   Conjunctiva/Sclera White and quiet White and quiet   Cornea 1-2+ Punctate epithelial erosions, mild arcus, well healed cataract wound, tear film debris Trace tear film debris, well healed cataract wound, arcus   Anterior Chamber deep and clear deep and clear   Iris Round and dilated round and poorly dilated   Lens PC IOL in good position, 1-2+ Posterior capsular opacification PC IOL in good position, 2-3+ Posterior capsular opacification   Anterior Vitreous mild syneresis Vitreous syneresis         Fundus Exam       Right Left   Posterior Vitreous Posterior vitreous detachment Posterior vitreous detachment, vitreous condensations   Disc 2+Pallor, Sharp rim, Compact 2+Pallor, Sharp rim   C/D Ratio 0.2 0.3   Macula Blunted foveal reflex, refractile drusen, RPE mottling, clumping and atrophy, focal central edema -- stably improved, no frank heme Flat, blunted foveal reflex, refractile drusen, RPE mottling, clumping and atrophy, central GA, no frank heme   Vessels attenuated, Tortuous attenuated, Tortuous   Periphery Attached, reticular degeneration, no heme Attached, reticlar degeneration, no heme           IMAGING AND PROCEDURES  Imaging and Procedures for 06/25/2024  OCT, Retina - OU - Both Eyes       Right Eye Quality was good. Scan locations included subfoveal. Central Foveal Thickness: 204. Progression has been stable. Findings include  normal foveal contour, no IRF, retinal drusen , subretinal hyper-reflective material, pigment epithelial detachment, subretinal fluid, outer retinal atrophy (Patchy central ORA with PEDs, stable improvement in central pockets of SRF).   Left Eye Quality was poor. Scan locations included subfoveal. Central Foveal Thickness: 244. Progression has been stable. Findings include no IRF, no SRF, abnormal foveal contour, retinal drusen , outer retinal tubulation, pigment epithelial detachment, outer retinal atrophy (central ORA / GA with PEDs and ORT).   Notes *Images captured and stored on drive  Diagnosis / Impression:  OD: Patchy central ORA with PEDs, stable improvement in central pockets of SRF OS: non exudative ARMD - central ORA / GA with PEDs and ORT  Clinical management:  See below  Abbreviations: NFP - Normal foveal profile. CME - cystoid macular edema. PED - pigment epithelial detachment. IRF - intraretinal fluid. SRF - subretinal fluid. EZ - ellipsoid zone. ERM - epiretinal membrane. ORA - outer retinal atrophy. ORT - outer retinal tubulation. SRHM - subretinal hyper-reflective material. IRHM - intraretinal hyper-reflective material      Intravitreal Injection, Pharmacologic Agent - OD - Right Eye       Time Out 06/25/2024. 9:24 AM. Confirmed correct patient, procedure, site, and patient consented.   Anesthesia Topical anesthesia was used. Anesthetic medications included Lidocaine  2%, Proparacaine 0.5%.   Procedure Preparation included 5% betadine to ocular surface, eyelid speculum. A (32g) needle was used.   Injection: 2 mg aflibercept  2 MG/0.05ML   Route: Intravitreal, Site: Right Eye   NDC: [id number], Lot: [id number], Expiration date: 08/09/2025, Waste: 0 mL   Post-op Post injection exam found visual acuity of at least counting fingers. The patient tolerated the procedure well. There were no complications. The patient received written and verbal post procedure care  education.      POCT INR      Component Value Flag Ref Range Units Status   INR 2.1      2.0 - 3.0  Final   POC INR                            ASSESSMENT/PLAN:    ICD-10-CM   1. Exudative age-related macular degeneration of right eye with active choroidal neovascularization (HCC)  H35.3211 OCT, Retina - OU - Both Eyes    Intravitreal Injection, Pharmacologic Agent - OD - Right Eye    aflibercept  (EYLEA ) SOLN 2 mg    2. 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 Essential hypertension  I10     4. Hypertensive retinopathy of both eyes  H35.033     5. Pseudophakia, both eyes  Z96.1       Exudative age related macular degeneration, OD  - previous Dr. Elner pt here due to insurance  - pt reports history of injections OD w/ [name], but unable to see record of that - here s/p IVA OD #1 (02.07.24), #2 (03.13.24), #3 (04.17.24), #4 (05.31.24) -- IVA resistance  - s/p IVE OD #1 (06.28.24), #2 (07.29.24), #3 (08.26.24), #4 (09.30.24), #5 (11.11.24), #6 (12.30.24), #7 (02.25.25), #8 (04.29.25), #9 (07.08.25)  - BCVA OD 20/80 from 20/200 - OCT OD: Patchy central ORA with PEDs, stable improvement in central pockets of SRF at 10 weeks  - recommend IVE OD #10 today, 09.16.25 w/ f/u up in 10 wks  - Good Days unavailable -- pt covering 20% coinsurance for medication - pt wishes to proceed with injection - RBA of procedure discussed, questions answered - IVE informed consent obtained and signed, 06.28.24 (OD) - see procedure note   - f/u in 10 wks -- DFE/OCT, possible injection  2. Age related macular degeneration, non-exudative, left eye  - advanced stage w/ significant central GA  - BCVA CF 3' -- stable   - history of IVA OS w/ [name], last 05.24.21  - OCT OS shows Central ORA / GA with PEDs and ORT  - Recommend amsler grid monitoring  - monitor  3,4. Hypertensive retinopathy OU - discussed  importance of tight BP control - monitor  5. Pseudophakia OU  - s/p CE/IOL (Dr. Roz)  - IOL in good position, doing well  - monitor  Ophthalmic Meds Ordered this visit:  Meds ordered this encounter  Medications   aflibercept  (EYLEA ) SOLN 2 mg     Return in about 10 weeks (around 09/03/2024) for exu ARMD OD, DFE, OCT, Possible Injxn.  There are no Patient Instructions on file for this visit.  This document serves as a record of services personally performed by Redell JUDITHANN Hans, MD, PhD. It was created on their behalf by Wanda GEANNIE Keens, COT an ophthalmic technician. The creation of this record is the provider's dictation and/or activities during the visit.    Electronically signed by:  Wanda GEANNIE Keens, COT  07/02/24 6:15 PM  This document serves as a record of services personally performed by Redell JUDITHANN Hans, MD, PhD. It was created on their behalf by Almetta Pesa, an ophthalmic technician. The creation of this record is the provider's dictation and/or activities during the visit.    Electronically signed by: Almetta Pesa, OA, 07/02/24  6:15 PM  Redell JUDITHANN Hans, M.D., Ph.D. Diseases & Surgery of the  Retina and Vitreous [facility]   I have reviewed the above documentation for accuracy and completeness, and I agree with the above. Redell JUDITHANN Hans, M.D., Ph.D. 07/02/24 6:16 PM   Abbreviations: M myopia (nearsighted); A astigmatism; H hyperopia (farsighted); P presbyopia; Mrx spectacle prescription;  CTL contact lenses; OD right eye; OS left eye; OU both eyes  XT exotropia; ET esotropia; PEK punctate epithelial keratitis; PEE punctate epithelial erosions; DES dry eye syndrome; MGD meibomian gland dysfunction; ATs artificial tears; PFAT's preservative free artificial tears; NSC nuclear sclerotic cataract; PSC posterior subcapsular cataract; ERM epi-retinal membrane; PVD posterior vitreous detachment; RD retinal detachment; DM diabetes mellitus; DR  diabetic retinopathy; NPDR non-proliferative diabetic retinopathy; PDR proliferative diabetic retinopathy; CSME clinically significant macular edema; DME diabetic macular edema; dbh dot blot hemorrhages; CWS cotton wool spot; POAG primary open angle glaucoma; C/D cup-to-disc ratio; HVF humphrey visual field; GVF goldmann visual field; OCT optical coherence tomography; IOP intraocular pressure; BRVO Branch retinal vein occlusion; CRVO central retinal vein occlusion; CRAO central retinal artery occlusion; BRAO branch retinal artery occlusion; RT retinal tear; SB scleral buckle; PPV pars plana vitrectomy; VH Vitreous hemorrhage; PRP panretinal laser photocoagulation; IVK intravitreal kenalog; VMT vitreomacular traction; MH Macular hole;  NVD neovascularization of the disc; NVE neovascularization elsewhere; AREDS age related eye disease study; ARMD age related macular degeneration; POAG primary open angle glaucoma; EBMD epithelial/anterior basement membrane dystrophy; ACIOL anterior chamber intraocular lens; IOL intraocular lens; PCIOL posterior chamber intraocular lens; Phaco/IOL phacoemulsification with intraocular lens placement; PRK photorefractive keratectomy; LASIK laser assisted in situ keratomileusis; HTN hypertension; DM diabetes mellitus; COPD chronic obstructive pulmonary disease

## 2024-06-25 ENCOUNTER — Encounter (INDEPENDENT_AMBULATORY_CARE_PROVIDER_SITE_OTHER): Payer: Self-pay | Admitting: Ophthalmology

## 2024-06-25 ENCOUNTER — Encounter

## 2024-06-25 ENCOUNTER — Ambulatory Visit (INDEPENDENT_AMBULATORY_CARE_PROVIDER_SITE_OTHER): Admitting: Ophthalmology

## 2024-06-25 ENCOUNTER — Ambulatory Visit: Attending: Cardiology

## 2024-06-25 DIAGNOSIS — Z961 Presence of intraocular lens: Secondary | ICD-10-CM

## 2024-06-25 DIAGNOSIS — I10 Essential (primary) hypertension: Secondary | ICD-10-CM

## 2024-06-25 DIAGNOSIS — Z7901 Long term (current) use of anticoagulants: Secondary | ICD-10-CM

## 2024-06-25 DIAGNOSIS — H35033 Hypertensive retinopathy, bilateral: Secondary | ICD-10-CM | POA: Diagnosis not present

## 2024-06-25 DIAGNOSIS — I4891 Unspecified atrial fibrillation: Secondary | ICD-10-CM | POA: Diagnosis not present

## 2024-06-25 DIAGNOSIS — H353211 Exudative age-related macular degeneration, right eye, with active choroidal neovascularization: Secondary | ICD-10-CM

## 2024-06-25 DIAGNOSIS — I4821 Permanent atrial fibrillation: Secondary | ICD-10-CM

## 2024-06-25 DIAGNOSIS — Z5181 Encounter for therapeutic drug level monitoring: Secondary | ICD-10-CM | POA: Diagnosis not present

## 2024-06-25 DIAGNOSIS — H353124 Nonexudative age-related macular degeneration, left eye, advanced atrophic with subfoveal involvement: Secondary | ICD-10-CM | POA: Diagnosis not present

## 2024-06-25 LAB — POCT INR: INR: 2.1 (ref 2.0–3.0)

## 2024-06-25 NOTE — Patient Instructions (Signed)
 continue taking 1 tablet daily except 0.5 tablet on Sunday, Monday, Wednesday and Friday. INR in 5 weeks Stay consistent with greens (2 times per week)   Coumadin  Clinic [phone]

## 2024-06-26 DIAGNOSIS — K080 Exfoliation of teeth due to systemic causes: Secondary | ICD-10-CM | POA: Diagnosis not present

## 2024-06-28 DIAGNOSIS — J3089 Other allergic rhinitis: Secondary | ICD-10-CM | POA: Diagnosis not present

## 2024-07-01 ENCOUNTER — Ambulatory Visit: Admitting: Pulmonary Disease

## 2024-07-01 ENCOUNTER — Encounter: Payer: Self-pay | Admitting: Pulmonary Disease

## 2024-07-01 VITALS — BP 136/84 | HR 55 | Ht 72.0 in | Wt 213.0 lb

## 2024-07-01 DIAGNOSIS — J42 Unspecified chronic bronchitis: Secondary | ICD-10-CM | POA: Diagnosis not present

## 2024-07-01 DIAGNOSIS — I340 Nonrheumatic mitral (valve) insufficiency: Secondary | ICD-10-CM

## 2024-07-01 DIAGNOSIS — K7460 Unspecified cirrhosis of liver: Secondary | ICD-10-CM

## 2024-07-01 DIAGNOSIS — Z952 Presence of prosthetic heart valve: Secondary | ICD-10-CM

## 2024-07-01 DIAGNOSIS — I7121 Aneurysm of the ascending aorta, without rupture: Secondary | ICD-10-CM | POA: Diagnosis not present

## 2024-07-01 DIAGNOSIS — J479 Bronchiectasis, uncomplicated: Secondary | ICD-10-CM | POA: Diagnosis not present

## 2024-07-01 NOTE — Progress Notes (Signed)
 Quientin Jent    [id number]    [birth year]  Primary Care Physician:Edstrom, Joesph LABOR, PA  Referring Physician: Wallace Joesph LABOR, PA [address] [place],  [place] [address]  Chief complaint: Follow up for dyspnea  HPI: 89 y.o. 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  Discussed the use of AI scribe software for clinical note transcription with the patient, who gave verbal consent to proceed.  History of Present Illness Yared Barefoot. is an 89 year old male with COPD and aortic stenosis status post TAVR, severe MR, macular degeneration of the eye, Alzheimer's, hypertension, hyperlipidemia who presents with shortness of breath. He is accompanied by his daughter.  He experiences shortness of breath primarily during exertion, such as walking and going up hills, but not at rest. He has a persistent non-productive cough. No congestion or wheezing. He uses Trelegy daily, which he has been on for several years, and has a rescue inhaler that he has not used.  He has been diagnosed with COPD, although pulmonary function tests have not been conducted to confirm this diagnosis.  He has significant cardiac history, including aortic stenosis and severe mitral regurgitation, with a TAVR procedure performed in January 2024. He reports no improvement in breathing post-procedure. His activity level is low, primarily watching TV and sleeping, with minimal physical activity.  He also has a history of macular degeneration and a recent diagnosis of Alzheimer's disease. He experiences nasal drip and 'goopy' eyes. No known allergies or acid reflux.   Interim history Wojciech Willetts. is an 89 year old male with  bronchiectasis and bronchitis who presents with cough and shortness of breath. He is accompanied by his daughter, Lauraine.  Cough and dyspnea - Persistent cough and shortness of breath - Requires frequent rest and use of a walker for ambulation - Shortness of breath is worsened by leg weakness - Activity is significantly limited, especially after recent skin surgeries - Uses daily Trelegy and as-needed albuterol  with minimal symptomatic relief  Functional limitation - Exercise tolerance is poor, with further reduction following recent skin surgeries - Leg weakness contributes to decreased mobility and increased dyspnea  Recent surgical procedures - Underwent Mohs procedure for skin cancer on the nose - Cartilage grafting from the ear and skin grafting from the neck performed - Postoperative recovery has further limited ability to exercise   Relevant pulmonary history Pets: No pets 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 No h/o chemo/XRT/amiodarone/macrodantin/MTX  No exposure to silica or other organic allergens  Smoking history: 10-pack-year smoker.  Quit in his 64s Travel history: Originally from [place] .  He has not traveled recently except for a trip to [place]  in March. Relevant family history: No relevant family history of lung disease  Outpatient Encounter Medications as of 07/01/2024  Medication Sig   acetaminophen  (TYLENOL ) 500 MG tablet Take 1,000 mg by mouth every 6 (six) hours as needed for moderate pain or headache.   albuterol  (VENTOLIN  HFA) 108 (90 Base) MCG/ACT inhaler Inhale 2 puffs into the lungs every 6 (six) hours as needed for wheezing or shortness  of breath.   cetirizine  (EQ ALLERGY RELIEF, CETIRIZINE ,) 10 MG tablet Take 1 tablet (10 mg total) by mouth daily.   Fluticasone -Umeclidin-Vilant  (TRELEGY ELLIPTA ) 100-62.5-25 MCG/ACT AEPB INHALE 1 PUFF INTO THE LUNGS  ONCE DAILY   furosemide  (LASIX ) 40 MG tablet Take 1 tablet by mouth once daily   memantine  (NAMENDA ) 10 MG tablet Take 1 tablet (10 mg total) by mouth 2 (two) times daily.   metoprolol  succinate (TOPROL -XL) 50 MG 24 hr tablet Take 0.5 tablets (25 mg total) by mouth daily. Take with or immediately following a meal. May split current pills in half until bottle is gone.   Multiple Vitamins-Minerals (MULTIVITAMIN WITH MINERALS) tablet Take 1 tablet by mouth daily.   Multiple Vitamins-Minerals (OCUVITE PO) Take 1 tablet by mouth daily.   OMEGA-3 FATTY ACIDS PO Take 500 mg by mouth daily.   sodium chloride  (OCEAN) 0.65 % SOLN nasal spray Place 1 spray into both nostrils daily as needed for congestion.   warfarin (COUMADIN ) 5 MG tablet TAKE 1/2 TO 1 (ONE-HALF TO ONE) TABLET BY MOUTH ONCE DAILY AS DIRECTED BY ANTICOAGULATION CLINIC   finasteride  (PROSCAR ) 5 MG tablet Take 1 tablet by mouth once daily   No facility-administered encounter medications on file as of 07/01/2024.   Vitals:   07/01/24 0914  BP: 136/84  Pulse: (!) 55  SpO2: 98%   Physical Exam GEN: No acute distress CV: Regular rate and rhythm no murmurs LUNGS: Clear to auscultation bilaterally normal respiratory effort SKIN JOINTS: Warm and dry no rash   Data Reviewed: Imaging: CT chest 02/18/2022-stable dilatation of ascending aorta, mild bronchial wall thickening with mild bronchiectasis in the lower lobes, cardiomegaly, trace right pleural effusion, hepatic cirrhosis.  I reviewed images personally.  PFTs: 04/30/2024 FVC 2.61 [64%], FEV1 2.26 [79%], F/F87, TLC 5.92 [79%], DLCO 23.98 [96%] Normal test  Labs: CBC 12/04/2023-WBC 7.8, eos 3%, absolute eosinophil count 234  Assessment & Plan Bronchiectasis and chronic bronchitis with shortness of breath Mild bronchiectasis and bronchial wall thickening, likely due to past infections and age-related changes.  Lung function tests are normal, indicating good lung capacity. Shortness of breath is likely multifactorial, related to cardiac issues, weakness, and deconditioning. Current use of Trelegy inhaler may not be necessary as it does not provide significant benefit. Albuterol  is used as needed but not frequently required. - Discontinue Trelegy inhaler for a few weeks to assess impact on symptoms. - Continue albuterol  as needed for shortness of breath. - Encourage exercise post-surgery recovery, potentially involving a supervised rehabilitation program.  Weakness and deconditioning Weakness and deconditioning contribute to shortness of breath and instability, necessitating the use of a walker. Recent surgeries have limited his ability to exercise, but rehabilitation is recommended once recovery is sufficient. - Consider referral to a rehabilitation program for supervised exercise once surgical recovery is complete. - Encourage light exercise at home, such as using a stationary bike, to improve conditioning.  Ascending aortic aneurysm Presence of an ascending aortic aneurysm noted on CT scan. Current size does not necessitate surgical intervention. Echocardiograms are being conducted for surveillance. - Continue annual echocardiograms to monitor the size of the ascending aortic aneurysm.  Severe aortic stenosis with TAVR Severe mitral regurgitation Managed with TAVR procedure in January 2024. Contributing to overall bearthing issues. Managed conservatively with medical management.  Liver cirrhosis Liver cirrhosis possibly related to past alcohol  use or heart-related pressure effects. Liver function tests earlier this year were normal, indicating well-managed liver function. - Monitor liver function tests periodically to  assess liver health.  Recommendations: Trial of Trelegy inhaler Encourage exercise  Lonna Coder MD [facility] 07/01/2024, 9:23 AM  CC: Wallace Joesph LABOR, PA

## 2024-07-01 NOTE — Patient Instructions (Signed)
  VISIT SUMMARY: During your visit, we discussed your persistent cough, shortness of breath, and overall functional limitations. We also reviewed your recent surgeries and their impact on your mobility and exercise tolerance. Additionally, we addressed your bronchiectasis, chronic bronchitis, and other health concerns including your aortic aneurysm and liver cirrhosis.  YOUR PLAN: BRONCHIECTASIS AND CHRONIC BRONCHITIS WITH SHORTNESS OF BREATH: You have mild bronchiectasis and bronchial wall thickening, likely due to past infections and age-related changes. Your lung function tests are normal, but your shortness of breath is likely due to multiple factors including cardiac issues, weakness, and deconditioning. -Stop using the Trelegy inhaler for a few weeks to see if there is any change in your symptoms. -Continue using albuterol  as needed for shortness of breath. -Encourage light exercise and consider a supervised rehabilitation program once you have recovered from your recent surgeries.  WEAKNESS AND DECONDITIONING: Your weakness and deconditioning are contributing to your shortness of breath and instability, which is why you need to use a walker. Your recent surgeries have further limited your ability to exercise. -Consider joining a rehabilitation program for supervised exercise once you have fully recovered from your surgeries. -Encourage light exercise at home, such as using a stationary bike, to help improve your conditioning.  ASCENDING AORTIC ANEURYSM: You have an ascending aortic aneurysm that is being monitored. Its current size does not require surgery. -Continue with your annual echocardiograms to monitor the size of the aneurysm.  LIVER CIRRHOSIS: You have liver cirrhosis, possibly related to past alcohol  use or heart-related pressure effects. Your liver function tests earlier this year were normal. -We will continue to monitor your liver function tests periodically to ensure your liver  health remains stable.

## 2024-07-02 ENCOUNTER — Encounter (INDEPENDENT_AMBULATORY_CARE_PROVIDER_SITE_OTHER): Payer: Self-pay | Admitting: Ophthalmology

## 2024-07-02 MED ORDER — AFLIBERCEPT 2MG/0.05ML IZ SOLN FOR KALEIDOSCOPE
2.0000 mg | INTRAVITREAL | Status: AC | PRN
  Administered 2024-07-02: 2 mg via INTRAVITREAL

## 2024-07-24 ENCOUNTER — Other Ambulatory Visit: Payer: Self-pay | Admitting: Cardiology

## 2024-07-24 DIAGNOSIS — I4891 Unspecified atrial fibrillation: Secondary | ICD-10-CM

## 2024-07-24 NOTE — Telephone Encounter (Signed)
 Refill request for warfarin:  Last INR was 2.1 on 06/25/24 Next INR due 07/30/24 LOV was 03/26/24  Refill approved.

## 2024-07-30 ENCOUNTER — Ambulatory Visit: Attending: Cardiology

## 2024-07-30 DIAGNOSIS — Z5181 Encounter for therapeutic drug level monitoring: Secondary | ICD-10-CM

## 2024-07-30 DIAGNOSIS — I4891 Unspecified atrial fibrillation: Secondary | ICD-10-CM

## 2024-07-30 DIAGNOSIS — I4821 Permanent atrial fibrillation: Secondary | ICD-10-CM

## 2024-07-30 DIAGNOSIS — Z7901 Long term (current) use of anticoagulants: Secondary | ICD-10-CM

## 2024-07-30 LAB — POCT INR: INR: 2.9 (ref 2.0–3.0)

## 2024-07-30 NOTE — Patient Instructions (Signed)
 continue taking 1 tablet daily except 0.5 tablet on Sunday, Monday, Wednesday and Friday. INR in 6 weeks Stay consistent with greens (2 times per week)   Coumadin  Clinic [phone]

## 2024-08-06 DIAGNOSIS — I4891 Unspecified atrial fibrillation: Secondary | ICD-10-CM | POA: Diagnosis not present

## 2024-08-09 DIAGNOSIS — J42 Unspecified chronic bronchitis: Secondary | ICD-10-CM | POA: Diagnosis not present

## 2024-08-09 DIAGNOSIS — I4821 Permanent atrial fibrillation: Secondary | ICD-10-CM | POA: Diagnosis not present

## 2024-08-09 DIAGNOSIS — G301 Alzheimer's disease with late onset: Secondary | ICD-10-CM | POA: Diagnosis not present

## 2024-08-09 DIAGNOSIS — R54 Age-related physical debility: Secondary | ICD-10-CM | POA: Diagnosis not present

## 2024-08-20 NOTE — Progress Notes (Signed)
 [facility] - Clinic Note  09/03/2024     CHIEF COMPLAINT Patient presents for Retina Follow Up   HISTORY OF PRESENT ILLNESS: Warren Veras. is a 89 y.o. male who presents to the clinic today for:   HPI     Retina Follow Up   Patient presents with  Wet AMD.  In both eyes.  This started 4.5 years ago.  Severity is moderate.  Duration of 10 weeks.  Since onset it is stable.  I, the attending physician,  performed the HPI with the patient and updated documentation appropriately.        Comments   Pt denies any changes or concerns with vision.      Last edited by Warren Rogue, MD on 09/03/2024 12:30 PM.     Patient feels the vision is about the same.   Referring physician: Wallace Wagner LABOR, PA [address] [place],  [place] [address]  HISTORICAL INFORMATION:   Selected notes from the MEDICAL RECORD NUMBER [name] transferring care due to insurance LEE: 03.27.23 [BCVA 20/40 OD, CF OS] Ocular Hx- ex ARMD OU; last injection was IVA OS on 05.24.21  PMH-    CURRENT MEDICATIONS: No current outpatient medications on file. (Ophthalmic Drugs)   No current facility-administered medications for this visit. (Ophthalmic Drugs)   Current Outpatient Medications (Other)  Medication Sig   acetaminophen  (TYLENOL ) 500 MG tablet Take 1,000 mg by mouth every 6 (six) hours as needed for moderate pain or headache.   albuterol  (VENTOLIN  HFA) 108 (90 Base) MCG/ACT inhaler Inhale 2 puffs into the lungs every 6 (six) hours as needed for wheezing or shortness of breath.   cetirizine  (EQ ALLERGY RELIEF, CETIRIZINE ,) 10 MG tablet Take 1 tablet (10 mg total) by mouth daily.   finasteride  (PROSCAR ) 5 MG tablet Take 1 tablet by mouth once daily   Fluticasone -Umeclidin-Vilant (TRELEGY ELLIPTA ) 100-62.5-25 MCG/ACT AEPB INHALE 1 PUFF INTO THE LUNGS  ONCE DAILY   furosemide  (LASIX ) 40 MG tablet Take 1 tablet by mouth once daily   memantine  (NAMENDA ) 10 MG tablet Take 1  tablet (10 mg total) by mouth 2 (two) times daily.   metoprolol  succinate (TOPROL -XL) 50 MG 24 hr tablet Take 0.5 tablets (25 mg total) by mouth daily. Take with or immediately following a meal. May split current pills in half until bottle is gone.   Multiple Vitamins-Minerals (MULTIVITAMIN WITH MINERALS) tablet Take 1 tablet by mouth daily.   Multiple Vitamins-Minerals (OCUVITE PO) Take 1 tablet by mouth daily.   OMEGA-3 FATTY ACIDS PO Take 500 mg by mouth daily.   sodium chloride  (OCEAN) 0.65 % SOLN nasal spray Place 1 spray into both nostrils daily as needed for congestion.   warfarin (COUMADIN ) 5 MG tablet TAKE 1/2 TO 1 (ONE-HALF TO ONE) TABLET BY MOUTH ONCE DAILY AS DIRECTED BY  ANTICOAGULATION  CLINIC.   No current facility-administered medications for this visit. (Other)   REVIEW OF SYSTEMS: ROS   Positive for: Cardiovascular, Eyes Negative for: Constitutional, Gastrointestinal, Neurological, Skin, Genitourinary, Musculoskeletal, HENT, Endocrine, Respiratory, Psychiatric, Allergic/Imm, Heme/Lymph Last edited by Warren Wagner, COT on 09/03/2024  8:57 AM.        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 Verlin & Dr. Lucas   Severe aortic stenosis    Umbilical hernia 07/27/2012   Past Surgical History:  Procedure Laterality Date   CARDIOVERSION  10/10/2006   cataracts Bilateral    HERNIA REPAIR  09/10/2012   large umbilical hernia   INSERTION OF MESH  09/10/2012   Procedure: INSERTION OF MESH;  Surgeon: Warren FORBES Hummer, MD;  Location[facility] OR;  Service: General;  Laterality: N/A;   INTRAOPERATIVE TRANSTHORACIC ECHOCARDIOGRAM  N/A 10/18/2022   Procedure: INTRAOPERATIVE TRANSTHORACIC ECHOCARDIOGRAM;  Surgeon: Warren Lonni BIRCH, MD;  Location: [facility] INVASIVE CV LAB;  Service: Open Heart Surgery;  Laterality: N/A;   macular degeneration Bilateral    RIGHT HEART CATH AND CORONARY ANGIOGRAPHY N/A 09/09/2022   Procedure: RIGHT HEART CATH AND CORONARY ANGIOGRAPHY;  Surgeon: Warren Lonni BIRCH, MD;  Location: [facility] INVASIVE CV LAB;  Service: Cardiovascular;  Laterality: N/A;   TONSILLECTOMY     maybe (09/10/2012)   TRANSCATHETER AORTIC VALVE REPLACEMENT, TRANSFEMORAL N/A 10/18/2022   Procedure: Transcatheter Aortic Valve Replacement, Transfemoral;  Surgeon: Warren Lonni BIRCH, MD;  Location: [facility] INVASIVE CV LAB;  Service: Open Heart Surgery;  Laterality: N/A;   UMBILICAL HERNIA REPAIR  09/10/2012   Procedure: HERNIA REPAIR UMBILICAL ADULT;  Surgeon: Warren FORBES Hummer, MD;  Location: [facility] OR;  Service: General;  Laterality: N/A;   FAMILY HISTORY Family History  Problem Relation Age of Onset   Heart disease Mother    Stroke Father    Alzheimer's disease Neg Hx    Dementia Neg Hx    SOCIAL HISTORY Social History   Tobacco Use   Smoking status: Former    Current packs/day: 0.00    Average packs/day: 1.5 packs/day for 10.0 years (15.0 ttl pk-yrs)    Types: Cigarettes    Start date: 07/27/1972    Quit date: 07/27/1982    Years since quitting: 42.1   Smokeless tobacco: Never  Vaping Use   Vaping status: Never Used  Substance Use Topics   Alcohol  use: Not Currently   Drug use: No       OPHTHALMIC EXAM:  Base Eye Exam     Visual Acuity (Snellen - Linear)       Right Left   Dist [place] 20/150 +1 CF@2 '   Dist ph [place] 20/80 -1 NI         Tonometry (Tonopen, 9:03 AM)       Right Left   Pressure 12 12         Pupils       Pupils Dark Light Shape React APD   Right PERRL 2 1 Round Minimal None   Left PERRL 2 1 Round Minimal None         Visual Fields       Left Right    Full Full          Extraocular Movement       Right Left    Full, Ortho Full, Ortho         Neuro/Psych     Oriented x3: Yes   Mood/Affect: Normal         Dilation     Both eyes: 1.0% Mydriacyl, 2.5% Phenylephrine  @ 9:03 AM           Slit Lamp and Fundus Exam     External Exam       Right Left  External Normal Normal         Slit Lamp Exam       Right Left   Lids/Lashes Dermatochalasis - upper lid, Ptosis Dermatochalasis - upper lid, mild MGD   Conjunctiva/Sclera White and quiet White and quiet   Cornea 1-2+ Punctate epithelial erosions, mild arcus, well healed cataract wound, tear film debris Trace tear film debris, well healed cataract wound, arcus   Anterior Chamber deep and clear deep and clear   Iris Round and dilated round and poorly dilated   Lens PC IOL in good position, 1-2+ Posterior capsular opacification PC IOL in good position, 2-3+ Posterior capsular opacification   Anterior Vitreous mild syneresis Vitreous syneresis         Fundus Exam       Right Left   Posterior Vitreous Posterior vitreous detachment Posterior vitreous detachment, vitreous condensations   Disc 2+Pallor, Sharp rim, Compact 2+Pallor, Sharp rim   C/D Ratio 0.2 0.3   Macula Blunted foveal reflex, refractile drusen, RPE mottling, clumping and atrophy, focal central edema -- stably improved, no frank heme or fluid Flat, blunted foveal reflex, refractile drusen, RPE mottling, clumping and atrophy, central GA, no frank heme   Vessels attenuated, Tortuous attenuated, Tortuous   Periphery Attached, reticular degeneration, no heme Attached, reticlar degeneration, no heme           IMAGING AND PROCEDURES  Imaging and Procedures for 09/03/2024  OCT, Retina - OU - Both Eyes       Right Eye Quality was good. Scan locations included subfoveal. Central Foveal Thickness: 208. Progression has been stable. Findings include normal foveal contour, no IRF, retinal drusen , subretinal hyper-reflective  material, pigment epithelial detachment, subretinal fluid, outer retinal atrophy (Patchy central ORA with PEDs, persistent pockets of central SRF--slightly improved).   Left Eye Quality was poor. Scan locations included subfoveal. Central Foveal Thickness: 233. Progression has been stable. Findings include no IRF, no SRF, abnormal foveal contour, retinal drusen , outer retinal tubulation, pigment epithelial detachment, outer retinal atrophy (central ORA / GA with PEDs and ORT--no fluid).   Notes *Images captured and stored on drive  Diagnosis / Impression:  OD: Patchy central ORA with PEDs, persistent pockets of central SRF--slightly improved OS: non exudative ARMD - central ORA / GA with PEDs and ORT--no fluid  Clinical management:  See below  Abbreviations: NFP - Normal foveal profile. CME - cystoid macular edema. PED - pigment epithelial detachment. IRF - intraretinal fluid. SRF - subretinal fluid. EZ - ellipsoid zone. ERM - epiretinal membrane. ORA - outer retinal atrophy. ORT - outer retinal tubulation. SRHM - subretinal hyper-reflective material. IRHM - intraretinal hyper-reflective material      Intravitreal Injection, Pharmacologic Agent - OD - Right Eye       Time Out 09/03/2024. 9:06 AM. Confirmed correct patient, procedure, site, and patient consented.   Anesthesia Topical anesthesia was used. Anesthetic medications included Lidocaine  2%, Proparacaine 0.5%.   Procedure Preparation included 5% betadine to ocular surface, eyelid speculum. A (32g) needle was used.   Injection: 2 mg aflibercept  2 MG/0.05ML   Route: Intravitreal, Site: Right Eye   NDC: [id number], Lot: [id number], Expiration date: 09/08/2025, Waste: 0 mL   Post-op Post injection exam found visual acuity of at least counting fingers. The patient tolerated the procedure well. There were no complications. The patient received written and verbal post procedure care education.                ASSESSMENT/PLAN:  ICD-10-CM   1. Exudative age-related macular degeneration of right eye with active choroidal neovascularization (HCC)  H35.3211 OCT, Retina - OU - Both Eyes    Intravitreal Injection, Pharmacologic Agent - OD - Right Eye    aflibercept  (EYLEA ) SOLN 2 mg    2. 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 Essential hypertension  I10     4. Hypertensive retinopathy of both eyes  H35.033     5. Pseudophakia, both eyes  Z96.1      Exudative age related macular degeneration, OD  - previous Dr. Elner pt here due to insurance - pt reports history of injections OD w/ [name], but unable to see record of that - here s/p IVA OD #1 (02.07.24), #2 (03.13.24), #3 (04.17.24), #4 (05.31.24)  -- IVA resistance ================== - s/p IVE OD #1 (06.28.24), #2 (07.29.24), #3 (08.26.24), #4 (09.30.24), #5 (11.11.24), #6 (12.30.24), #7 (02.25.25), #8 (04.29.25), #9 (07.08.25), #10 (09.16.25)  - BCVA OD 20/80 - stable - OCT OD: Patchy central ORA with PEDs, persistent pockets of central SRF--slightly improved at 10 weeks  - recommend IVE OD #11 today, 11.25.25 w/ f/u up in 10 wks - Good Days unavailable -- pt covering 20% coinsurance for medication - pt wishes to proceed with injection - RBA of procedure discussed, questions answered - IVE informed consent obtained and signed, 11.25.25 (OD) - see procedure note   - f/u in 10 wks -- DFE/OCT, possible injection  2. Age related macular degeneration, non-exudative, left eye  - advanced stage w/ significant central GA  - BCVA CF 3' -- stable   - history of IVA OS w/ [name], last 05.24.21  - OCT OS shows Central ORA / GA with PEDs and ORT  - Recommend amsler grid monitoring  - monitor  3,4. Hypertensive retinopathy OU - discussed importance of tight BP control - monitor  5. Pseudophakia OU  - s/p CE/IOL (Dr. Roz)  - IOL in good position, doing  well  - monitor  Ophthalmic Meds Ordered this visit:  Meds ordered this encounter  Medications   aflibercept  (EYLEA ) SOLN 2 mg     Return in about 10 weeks (around 11/12/2024) for f/u, Ex. AMD, DFE, OCT, Possible, IVE, OD.  There are no Patient Instructions on file for this visit.  This document serves as a record of services personally performed by Redell JUDITHANN Hans, MD, PhD. It was created on their behalf by Wanda GEANNIE Keens, COT an ophthalmic technician. The creation of this record is the provider's dictation and/or activities during the visit.    Electronically signed by:  Wanda GEANNIE Keens, COT  09/03/24 1:46 PM  Redell JUDITHANN Hans, M.D., Ph.D. Diseases & Surgery of the Retina and Vitreous [facility]  I have reviewed the above documentation for accuracy and completeness, and I agree with the above. Redell JUDITHANN Hans, M.D., Ph.D. 09/03/24 1:46 PM   Abbreviations: M myopia (nearsighted); A astigmatism; H hyperopia (farsighted); P presbyopia; Mrx spectacle prescription;  CTL contact lenses; OD right eye; OS left eye; OU both eyes  XT exotropia; ET esotropia; PEK punctate epithelial keratitis; PEE punctate epithelial erosions; DES dry eye syndrome; MGD meibomian gland dysfunction; ATs artificial tears; PFAT's preservative free artificial tears; NSC nuclear sclerotic cataract; PSC posterior subcapsular cataract; ERM epi-retinal membrane; PVD posterior vitreous detachment; RD retinal detachment; DM diabetes mellitus; DR diabetic retinopathy; NPDR non-proliferative diabetic retinopathy; PDR proliferative diabetic retinopathy; CSME  clinically significant macular edema; DME diabetic macular edema; dbh dot blot hemorrhages; CWS cotton wool spot; POAG primary open angle glaucoma; C/D cup-to-disc ratio; HVF humphrey visual field; GVF goldmann visual field; OCT optical coherence tomography; IOP intraocular pressure; BRVO Branch retinal vein occlusion; CRVO central retinal vein  occlusion; CRAO central retinal artery occlusion; BRAO branch retinal artery occlusion; RT retinal tear; SB scleral buckle; PPV pars plana vitrectomy; VH Vitreous hemorrhage; PRP panretinal laser photocoagulation; IVK intravitreal kenalog; VMT vitreomacular traction; MH Macular hole;  NVD neovascularization of the disc; NVE neovascularization elsewhere; AREDS age related eye disease study; ARMD age related macular degeneration; POAG primary open angle glaucoma; EBMD epithelial/anterior basement membrane dystrophy; ACIOL anterior chamber intraocular lens; IOL intraocular lens; PCIOL posterior chamber intraocular lens; Phaco/IOL phacoemulsification with intraocular lens placement; PRK photorefractive keratectomy; LASIK laser assisted in situ keratomileusis; HTN hypertension; DM diabetes mellitus; COPD chronic obstructive pulmonary disease

## 2024-09-03 ENCOUNTER — Ambulatory Visit (INDEPENDENT_AMBULATORY_CARE_PROVIDER_SITE_OTHER): Admitting: Ophthalmology

## 2024-09-03 ENCOUNTER — Telehealth: Payer: Self-pay | Admitting: *Deleted

## 2024-09-03 ENCOUNTER — Encounter (INDEPENDENT_AMBULATORY_CARE_PROVIDER_SITE_OTHER): Payer: Self-pay | Admitting: Ophthalmology

## 2024-09-03 DIAGNOSIS — H353124 Nonexudative age-related macular degeneration, left eye, advanced atrophic with subfoveal involvement: Secondary | ICD-10-CM | POA: Diagnosis not present

## 2024-09-03 DIAGNOSIS — Z961 Presence of intraocular lens: Secondary | ICD-10-CM

## 2024-09-03 DIAGNOSIS — H353211 Exudative age-related macular degeneration, right eye, with active choroidal neovascularization: Secondary | ICD-10-CM

## 2024-09-03 DIAGNOSIS — H35033 Hypertensive retinopathy, bilateral: Secondary | ICD-10-CM

## 2024-09-03 DIAGNOSIS — I10 Essential (primary) hypertension: Secondary | ICD-10-CM

## 2024-09-03 MED ORDER — AFLIBERCEPT 2MG/0.05ML IZ SOLN FOR KALEIDOSCOPE
2.0000 mg | INTRAVITREAL | Status: AC | PRN
  Administered 2024-09-03: 2 mg via INTRAVITREAL

## 2024-09-03 NOTE — Telephone Encounter (Signed)
 Patients daughter called and stated she needed to cancel the appointment for the patient next week in [place]. Asked if she would like to reschedule the following week and she states he now has home health for a few weeks and they have told them they will check the warfarin level. She did not have the name of the company. Advised to keep the Anticoagulation Clinic updated.  Added him to follow up book on appointment date to ensure we follow up in the event she does not keep us  updated.

## 2024-09-09 DIAGNOSIS — I4891 Unspecified atrial fibrillation: Secondary | ICD-10-CM | POA: Diagnosis not present

## 2024-09-09 DIAGNOSIS — L218 Other seborrheic dermatitis: Secondary | ICD-10-CM | POA: Diagnosis not present

## 2024-09-09 DIAGNOSIS — L570 Actinic keratosis: Secondary | ICD-10-CM | POA: Diagnosis not present

## 2024-09-10 ENCOUNTER — Ambulatory Visit

## 2024-09-17 ENCOUNTER — Ambulatory Visit: Attending: Cardiology

## 2024-09-17 DIAGNOSIS — I4821 Permanent atrial fibrillation: Secondary | ICD-10-CM | POA: Diagnosis not present

## 2024-09-17 DIAGNOSIS — Z5181 Encounter for therapeutic drug level monitoring: Secondary | ICD-10-CM | POA: Diagnosis not present

## 2024-09-17 DIAGNOSIS — Z7901 Long term (current) use of anticoagulants: Secondary | ICD-10-CM

## 2024-09-17 DIAGNOSIS — I4891 Unspecified atrial fibrillation: Secondary | ICD-10-CM | POA: Diagnosis not present

## 2024-09-17 LAB — POCT INR: INR: 3.3 — AB (ref 2.0–3.0)

## 2024-09-17 NOTE — Patient Instructions (Signed)
 continue taking 1 tablet daily except 0.5 tablet on Sunday, Monday, Wednesday and Friday. INR in 4 weeks. Eat greens tonight. Stay consistent with greens (2 times per week)   Coumadin  Clinic [phone]

## 2024-10-15 ENCOUNTER — Ambulatory Visit: Attending: Cardiology

## 2024-10-15 DIAGNOSIS — I4891 Unspecified atrial fibrillation: Secondary | ICD-10-CM

## 2024-10-15 DIAGNOSIS — Z7901 Long term (current) use of anticoagulants: Secondary | ICD-10-CM

## 2024-10-15 DIAGNOSIS — I4821 Permanent atrial fibrillation: Secondary | ICD-10-CM | POA: Diagnosis not present

## 2024-10-15 DIAGNOSIS — Z5181 Encounter for therapeutic drug level monitoring: Secondary | ICD-10-CM

## 2024-10-15 LAB — POCT INR: INR: 4.5 — AB (ref 2.0–3.0)

## 2024-10-15 NOTE — Patient Instructions (Signed)
 Hold Wednesday and Thursday then continue taking 1 tablet daily except 0.5 tablet on Sunday, Monday, Wednesday and Friday. INR in 3 weeks Stay consistent with greens (2 times per week)   Coumadin  Clinic [phone]

## 2024-10-28 ENCOUNTER — Encounter: Payer: Self-pay | Admitting: Cardiology

## 2024-10-28 ENCOUNTER — Telehealth: Payer: Self-pay | Admitting: Cardiology

## 2024-10-28 NOTE — Telephone Encounter (Signed)
 Pt's daughter would like to discuss his recent lab results. Pt would like a c/b regarding this matter, please advise.

## 2024-10-28 NOTE — Telephone Encounter (Signed)
 Spoke with patient daughter, per DPR, regarding blood work that was drawn by PCP on Friday. Pt had a few abnormal high labs and wanted suggestions from cardiology team. Myles Genin to review these results with Warren, NP who ordered them and obtain a plan from there. Will send to Dr Lavona for awareness for now, unless there are further recommendations. Verbalizes understanding of plan.

## 2024-10-29 NOTE — Progress Notes (Shared)
 " [facility] - Clinic Note  11/12/2024     CHIEF COMPLAINT Patient presents for No chief complaint on file.   HISTORY OF PRESENT ILLNESS: Warren Wagner. is a 89 y.o. male who presents to the clinic today for:     Patient feels the vision is about the same.   Referring physician: Wallace Joesph LABOR, PA [address] [place],  [place] [address]  HISTORICAL INFORMATION:   Selected notes from the MEDICAL RECORD NUMBER [name] transferring care due to insurance LEE: 03.27.23 [BCVA 20/40 OD, CF OS] Ocular Hx- ex ARMD OU; last injection was IVA OS on 05.24.21  PMH-    CURRENT MEDICATIONS: No current outpatient medications on file. (Ophthalmic Drugs)   No current facility-administered medications for this visit. (Ophthalmic Drugs)   Current Outpatient Medications (Other)  Medication Sig   acetaminophen  (TYLENOL ) 500 MG tablet Take 1,000 mg by mouth every 6 (six) hours as needed for moderate pain or headache.   albuterol  (VENTOLIN  HFA) 108 (90 Base) MCG/ACT inhaler Inhale 2 puffs into the lungs every 6 (six) hours as needed for wheezing or shortness of breath.   cetirizine  (EQ ALLERGY RELIEF, CETIRIZINE ,) 10 MG tablet Take 1 tablet (10 mg total) by mouth daily.   finasteride  (PROSCAR ) 5 MG tablet Take 1 tablet by mouth once daily   Fluticasone -Umeclidin-Vilant (TRELEGY ELLIPTA ) 100-62.5-25 MCG/ACT AEPB INHALE 1 PUFF INTO THE LUNGS  ONCE DAILY   furosemide  (LASIX ) 40 MG tablet Take 1 tablet by mouth once daily   memantine  (NAMENDA ) 10 MG tablet Take 1 tablet (10 mg total) by mouth 2 (two) times daily.   metoprolol  succinate (TOPROL -XL) 50 MG 24 hr tablet Take 0.5 tablets (25 mg total) by mouth daily. Take with or immediately following a meal. May split current pills in half until bottle is gone.   Multiple Vitamins-Minerals (MULTIVITAMIN WITH MINERALS) tablet Take 1 tablet by mouth daily.   Multiple Vitamins-Minerals (OCUVITE PO) Take 1 tablet by mouth daily.    OMEGA-3 FATTY ACIDS PO Take 500 mg by mouth daily.   sodium chloride  (OCEAN) 0.65 % SOLN nasal spray Place 1 spray into both nostrils daily as needed for congestion.   warfarin (COUMADIN ) 5 MG tablet TAKE 1/2 TO 1 (ONE-HALF TO ONE) TABLET BY MOUTH ONCE DAILY AS DIRECTED BY  ANTICOAGULATION  CLINIC.   No current facility-administered medications for this visit. 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 Verlin & Dr. Lucas   Severe aortic stenosis    Umbilical hernia 07/27/2012   Past Surgical History:  Procedure Laterality Date   CARDIOVERSION  10/10/2006   cataracts Bilateral    HERNIA REPAIR  09/10/2012   large umbilical hernia   INSERTION OF MESH  09/10/2012   Procedure: INSERTION OF MESH;  Surgeon: Dann FORBES Hummer, MD;  Location: [facility] OR;  Service: General;  Laterality: N/A;   INTRAOPERATIVE TRANSTHORACIC ECHOCARDIOGRAM N/A 10/18/2022   Procedure: INTRAOPERATIVE TRANSTHORACIC ECHOCARDIOGRAM;  Surgeon: Verlin Lonni BIRCH, MD;  Location: [facility] INVASIVE CV LAB;  Service: Open  Heart Surgery;  Laterality: N/A;   macular degeneration Bilateral    RIGHT HEART CATH AND CORONARY ANGIOGRAPHY N/A 09/09/2022   Procedure: RIGHT HEART CATH AND CORONARY ANGIOGRAPHY;  Surgeon: Verlin Lonni BIRCH, MD;  Location: [facility]VAS[facility] CV LAB;  Service: Cardiovascular;  Laterality: N/A;   TONSILLECTOMY     maybe (09/10/2012)   TRANSCATHETER AORTIC VALVE REPLACEMENT, TRANSFEMORAL N/A 10/18/2022   Procedure: Transcatheter Aortic Valve Replacement, Transfemoral;  Surgeon: Verlin Lonni BIRCH, MD;  Location: [facility] LAB;  Service: Open Heart Surgery;  Laterality: N/A;   UMBILICAL HERNIA REPAIR  09/10/2012   Procedure: HERNIA REPAIR UMBILICAL ADULT;  Surgeon: Dann FORBES Hummer, MD;  Location: [facility] OR;  Service: General;  Laterality: N/A;   FAMILY HISTORY Family History  Problem Relation Age of Onset   Heart disease Mother    Stroke Father    Alzheimer's disease Neg Hx    Dementia Neg Hx    SOCIAL HISTORY Social History   Tobacco Use   Smoking status: Former    Current packs/day: 0.00    Average packs/day: 1.5 packs/day for 10.0 years (15.0 ttl pk-yrs)    Types: Cigarettes    Start date: 07/27/1972    Quit date: 07/27/1982    Years since quitting: 42.2   Smokeless tobacco: Never  Vaping Use   Vaping status: Never Used  Substance Use Topics   Alcohol  use: Not Currently   Drug use: No       OPHTHALMIC EXAM:  Not recorded    IMAGING AND PROCEDURES  Imaging and Procedures for 11/12/2024             ASSESSMENT/PLAN:  No diagnosis found.  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 Elner pt here due to insurance - pt reports history of injections OD w/ [name], but unable to see record of that - here s/p IVA OD #1 (02.07.24), #2 (03.13.24), #3 (04.17.24), #4 (05.31.24)  -- IVA resistance ================== - s/p IVE OD #1 (06.28.24), #2 (07.29.24), #3 (08.26.24), #4 (09.30.24), #5 (11.11.24), #6 (12.30.24), #7 (02.25.25), #8 (04.29.25), #9 (07.08.25), #10 (09.16.25), #11 (11.25.25)  - BCVA OD 20/80 - stable - OCT OD: Patchy central ORA with PEDs, persistent pockets of central SRF--slightly improved at 10 weeks  - recommend IVE OD #12 today, 02.03.26 w/ f/u up in 10 wks - Good Days unavailable -- pt covering 20% coinsurance for medication - pt wishes to proceed with injection - RBA of procedure discussed, questions answered - IVE informed consent obtained and signed, 11.25.25 (OD) - see procedure note   - f/u in 10 wks -- DFE/OCT, possible  injection  2. Age related macular degeneration, non-exudative, left eye  - advanced stage w/ significant central GA  - BCVA CF 3' -- stable   - history of IVA OS w/ [name], last 05.24.21  - OCT OS shows Central ORA / GA with PEDs and ORT  - Recommend amsler grid monitoring  - monitor  3,4. Hypertensive retinopathy OU - discussed importance of tight BP control - monitor  5. Pseudophakia OU  - s/p CE/IOL (Dr. Roz)  - IOL in good position, doing well  - monitor  Ophthalmic Meds Ordered this visit:  No orders of the defined types were placed in this encounter.    No follow-ups on file.  There are no Patient Instructions on file for this visit.  This document serves as a record of services personally performed by Redell JUDITHANN Hans, MD, PhD. It was  created on their behalf by Wanda GEANNIE Keens, COT an ophthalmic technician. The creation of this record is the provider's dictation and/or activities during the visit.    Electronically signed by:  Wanda GEANNIE Keens, COT  10/29/24 7:23 AM  Redell JUDITHANN Hans, M.D., Ph.D. Diseases & Surgery of the Retina and Vitreous [facility]    Abbreviations: M myopia (nearsighted); A astigmatism; H hyperopia (farsighted); P presbyopia; Mrx spectacle prescription;  CTL contact lenses; OD right eye; OS left eye; OU both eyes  XT exotropia; ET esotropia; PEK punctate epithelial keratitis; PEE punctate epithelial erosions; DES dry eye syndrome; MGD meibomian gland dysfunction; ATs artificial tears; PFAT's preservative free artificial tears; NSC nuclear sclerotic cataract; PSC posterior subcapsular cataract; ERM epi-retinal membrane; PVD posterior vitreous detachment; RD retinal detachment; DM diabetes mellitus; DR diabetic retinopathy; NPDR non-proliferative diabetic retinopathy; PDR proliferative diabetic retinopathy; CSME clinically significant macular edema; DME diabetic macular edema; dbh dot blot hemorrhages; CWS cotton wool  spot; POAG primary open angle glaucoma; C/D cup-to-disc ratio; HVF humphrey visual field; GVF goldmann visual field; OCT optical coherence tomography; IOP intraocular pressure; BRVO Branch retinal vein occlusion; CRVO central retinal vein occlusion; CRAO central retinal artery occlusion; BRAO branch retinal artery occlusion; RT retinal tear; SB scleral buckle; PPV pars plana vitrectomy; VH Vitreous hemorrhage; PRP panretinal laser photocoagulation; IVK intravitreal kenalog; VMT vitreomacular traction; MH Macular hole;  NVD neovascularization of the disc; NVE neovascularization elsewhere; AREDS age related eye disease study; ARMD age related macular degeneration; POAG primary open angle glaucoma; EBMD epithelial/anterior basement membrane dystrophy; ACIOL anterior chamber intraocular lens; IOL intraocular lens; PCIOL posterior chamber intraocular lens; Phaco/IOL phacoemulsification with intraocular lens placement; PRK photorefractive keratectomy; LASIK laser assisted in situ keratomileusis; HTN hypertension; DM diabetes mellitus; COPD chronic obstructive pulmonary disease "

## 2024-11-01 NOTE — Telephone Encounter (Signed)
 Left voice message for daughter per DPR regarding Dr. Denver suggestions. Daughter told to call if she has any further questions.

## 2024-11-04 ENCOUNTER — Encounter: Payer: Self-pay | Admitting: Pharmacist

## 2024-11-05 ENCOUNTER — Ambulatory Visit

## 2024-11-06 ENCOUNTER — Ambulatory Visit
Admission: RE | Admit: 2024-11-06 | Discharge: 2024-11-06 | Disposition: A | Discharge status: Home / Self Care | Source: Ambulatory Visit

## 2024-11-06 VITALS — BP 145/76 | HR 55 | Temp 97.7°F | Resp 18 | Wt 213.0 lb

## 2024-11-06 DIAGNOSIS — H7291 Unspecified perforation of tympanic membrane, right ear: Secondary | ICD-10-CM

## 2024-11-06 NOTE — ED Provider Notes (Signed)
 " [facility]    CSN: [id number] Arrival date & time: 11/06/24  1042      History   Chief Complaint Chief Complaint  Patient presents with   Ear Drainage    right ear has been bleeding - Entered by patient    HPI Warren Wagner. is a 89 y.o. male.   Pt presents today due to 2 days of sudden onset of bloody drainage from right ear. Pt presents with daughter and denies that anything was put in his ear besides his hearing aids. Pt denies reduced hearing from baseline in right ear. Daughter states that she noticed blood on pillow when patient woke up.   The history is provided by the patient.  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 Verlin & Dr. 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   Insomnia 12/29/2021   Benign prostatic hyperplasia 12/29/2021   Advanced nonexudative age-related macular degeneration of left eye with subfoveal involvement 07/06/2021   Long term (current) use of anticoagulants 02/19/2021   Essential hypertension 01/11/2021   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) 03/02/2020   Advanced nonexudative age-related macular  degeneration of right eye without subfoveal involvement 03/02/2020   Posterior vitreous detachment of both eyes 03/02/2020   Lymphedema 11/22/2019   Venous stasis of lower extremity 11/13/2019   Atrial fibrillation, permanent (HCC) 07/27/2012    Past Surgical History:  Procedure Laterality Date   CARDIOVERSION  10/10/2006   cataracts Bilateral    HERNIA REPAIR  09/10/2012   large umbilical hernia   INSERTION OF MESH  09/10/2012   Procedure: INSERTION OF MESH;  Surgeon: Dann FORBES Hummer, MD;  Location: [facility] OR;  Service: General;  Laterality: N/A;   INTRAOPERATIVE TRANSTHORACIC ECHOCARDIOGRAM N/A 10/18/2022   Procedure: INTRAOPERATIVE TRANSTHORACIC ECHOCARDIOGRAM;  Surgeon: Verlin Lonni BIRCH, MD;  Location: [facility];  Service: Open Heart Surgery;  Laterality: N/A;   macular degeneration Bilateral    RIGHT HEART CATH AND CORONARY ANGIOGRAPHY N/A 09/09/2022   Procedure: RIGHT HEART CATH AND CORONARY ANGIOGRAPHY;  Surgeon: Verlin Lonni BIRCH, MD;  Location: [facility];  Service: Cardiovascular;  Laterality: N/A;   TONSILLECTOMY     maybe (09/10/2012)   TRANSCATHETER AORTIC VALVE REPLACEMENT, TRANSFEMORAL N/A 10/18/2022   Procedure: Transcatheter Aortic Valve Replacement, Transfemoral;  Surgeon: Verlin Lonni BIRCH, MD;  Location: [facility];  Service: Open Heart Surgery;  Laterality: N/A;   UMBILICAL HERNIA REPAIR  09/10/2012   Procedure: HERNIA REPAIR UMBILICAL ADULT;  Surgeon: Dann FORBES Hummer, MD;  Location: [facility] OR;  Service: General;  Laterality: N/A;       Home Medications    Prior to  Admission medications  Medication Sig Start Date End Date Taking? Authorizing Provider  amoxicillin  (AMOXIL ) 500 MG tablet SMARTSIG:4 Tablet(s) By Mouth 06/27/24  Yes [provider]  doxycycline (VIBRAMYCIN) 100 MG capsule Take 100 mg by mouth 2 (two) times daily. 05/30/24  Yes [provider]  traMADol  (ULTRAM ) 50 MG tablet Take 50 mg by mouth 2  (two) times daily as needed. 05/25/24  Yes [provider]  acetaminophen  (TYLENOL ) 500 MG tablet Take 1,000 mg by mouth every 6 (six) hours as needed for moderate pain or headache.    [provider]  albuterol  (VENTOLIN  HFA) 108 (90 Base) MCG/ACT inhaler Inhale 2 puffs into the lungs every 6 (six) hours as needed for wheezing or shortness of breath. 12/11/23   Wallace Joesph LABOR, PA  cetirizine  (EQ ALLERGY RELIEF, CETIRIZINE ,) 10 MG tablet Take 1 tablet (10 mg total) by mouth daily. 05/07/24   Gayle Saddie FALCON, PA-C  finasteride  (PROSCAR ) 5 MG tablet Take 1 tablet by mouth once daily 11/27/23   Wallace Joesph A, PA  Fluticasone -Umeclidin-Vilant (TRELEGY ELLIPTA ) 100-62.5-25 MCG/ACT AEPB INHALE 1 PUFF INTO THE LUNGS  ONCE DAILY 02/09/24   Chandra Toribio POUR, MD  furosemide  (LASIX ) 40 MG tablet Take 1 tablet by mouth once daily 11/28/23   Lavona Agent, MD  memantine  (NAMENDA ) 10 MG tablet Take 1 tablet (10 mg total) by mouth 2 (two) times daily. 02/01/24   Camara, Amadou, MD  metoprolol  succinate (TOPROL -XL) 50 MG 24 hr tablet Take 0.5 tablets (25 mg total) by mouth daily. Take with or immediately following a meal. May split current pills in half until bottle is gone. 03/26/24   Lavona Agent, MD  Multiple Vitamins-Minerals (MULTIVITAMIN WITH MINERALS) tablet Take 1 tablet by mouth daily.    [provider]  Multiple Vitamins-Minerals (OCUVITE PO) Take 1 tablet by mouth daily.    [provider]  OMEGA-3 FATTY ACIDS PO Take 500 mg by mouth daily.    [provider]  sodium chloride  (OCEAN) 0.65 % SOLN nasal spray Place 1 spray into both nostrils daily as needed for congestion.    [provider]  warfarin (COUMADIN ) 5 MG tablet TAKE 1/2 TO 1 (ONE-HALF TO ONE) TABLET BY MOUTH ONCE DAILY AS DIRECTED BY  ANTICOAGULATION  CLINIC. 07/24/24   Lavona Agent, MD    Family History Family History  Problem Relation Age of Onset   Heart disease Mother    Stroke  Father    Alzheimer's disease Neg Hx    Dementia Neg Hx     Social History Social History[1]   Allergies   Patient has no known allergies.   Review of Systems Review of Systems   Physical Exam Triage Vital Signs ED Triage Vitals [11/06/24 1057]  Encounter Vitals Group     BP (!) 145/76     Girls Systolic BP Percentile      Girls Diastolic BP Percentile      Boys Systolic BP Percentile      Boys Diastolic BP Percentile      Pulse Rate (!) 55     Resp 18     Temp 97.7 F (36.5 C)     Temp Source Oral     SpO2 96 %     Weight 212 lb 15.4 oz (96.6 kg)     Height      Head Circumference      Peak Flow      Pain Score 0     Pain Loc  Pain Education      Exclude from Growth Chart    No data found.  Updated Vital Signs BP (!) 145/76 (BP Location: Right Arm)   Pulse (!) 55   Temp 97.7 F (36.5 C) (Oral)   Resp 18   Wt 212 lb 15.4 oz (96.6 kg)   SpO2 96%   BMI 28.88 kg/m   Visual Acuity Right Eye Distance:   Left Eye Distance:   Bilateral Distance:    Right Eye Near:   Left Eye Near:    Bilateral Near:     Physical Exam Vitals and nursing note reviewed.  Constitutional:      General: He is not in acute distress.    Appearance: Normal appearance. He is not ill-appearing, toxic-appearing or diaphoretic.  HENT:     Right Ear: Drainage present. Tympanic membrane is perforated.     Ears:     Comments: Blood drainage, no TM appreciated of right ear Eyes:     General: No scleral icterus. Cardiovascular:     Rate and Rhythm: Normal rate and regular rhythm.     Heart sounds: Normal heart sounds.  Pulmonary:     Effort: Pulmonary effort is normal. No respiratory distress.     Breath sounds: Normal breath sounds. No wheezing or rhonchi.  Skin:    General: Skin is warm.  Neurological:     Mental Status: He is alert and oriented to person, place, and time.  Psychiatric:        Mood and Affect: Mood normal.        Behavior: Behavior normal.       UC Treatments / Results  Labs (all labs ordered are listed, but only abnormal results are displayed) Labs Reviewed - No data to display  EKG   Radiology No results found.  Procedures Procedures (including critical care time)  Medications Ordered in UC Medications - No data to display  Initial Impression / Assessment and Plan / UC Course  I have reviewed the triage vital signs and the nursing notes.  Pertinent labs & imaging results that were available during my care of the patient were reviewed by me and considered in my medical decision making (see chart for details).     Final Clinical Impressions(s) / UC Diagnoses   Final diagnoses:  Ruptured tympanic membrane, right     Discharge Instructions      Right tympanic membrane appears to be ruptured. Ruptured eardrums usually heal on their own. Keep ear clean and dry. Please follow-up with ENT.    ED Prescriptions   None    PDMP not reviewed this encounter.    [1]  Social History Tobacco Use   Smoking status: Former    Current packs/day: 0.00    Average packs/day: 1.5 packs/day for 10.0 years (15.0 ttl pk-yrs)    Types: Cigarettes    Start date: 07/27/1972    Quit date: 07/27/1982    Years since quitting: 42.3   Smokeless tobacco: Never  Vaping Use   Vaping status: Never Used  Substance Use Topics   Alcohol  use: Not Currently   Drug use: No     Andra Corean BROCKS, PA-C 11/06/24 1122  "

## 2024-11-06 NOTE — Discharge Instructions (Signed)
 Right tympanic membrane appears to be ruptured. Ruptured eardrums usually heal on their own. Keep ear clean and dry. Please follow-up with ENT.

## 2024-11-06 NOTE — ED Triage Notes (Addendum)
 Pt presents with daughter Arby, c/o ear drainage x 2 days. Pt states,  He is bleeding from his right ear. It started Sunday. He does wear hearing aids so he may have slept on it wrong. He has not been c/o any pain.

## 2024-11-07 ENCOUNTER — Other Ambulatory Visit: Payer: Self-pay | Admitting: Cardiology

## 2024-11-12 ENCOUNTER — Encounter (INDEPENDENT_AMBULATORY_CARE_PROVIDER_SITE_OTHER): Admitting: Ophthalmology

## 2024-11-12 ENCOUNTER — Ambulatory Visit

## 2024-11-12 DIAGNOSIS — I10 Essential (primary) hypertension: Secondary | ICD-10-CM

## 2024-11-12 DIAGNOSIS — I4891 Unspecified atrial fibrillation: Secondary | ICD-10-CM

## 2024-11-12 DIAGNOSIS — Z961 Presence of intraocular lens: Secondary | ICD-10-CM

## 2024-11-12 DIAGNOSIS — Z5181 Encounter for therapeutic drug level monitoring: Secondary | ICD-10-CM | POA: Diagnosis not present

## 2024-11-12 DIAGNOSIS — H353211 Exudative age-related macular degeneration, right eye, with active choroidal neovascularization: Secondary | ICD-10-CM

## 2024-11-12 DIAGNOSIS — H353124 Nonexudative age-related macular degeneration, left eye, advanced atrophic with subfoveal involvement: Secondary | ICD-10-CM

## 2024-11-12 DIAGNOSIS — I4821 Permanent atrial fibrillation: Secondary | ICD-10-CM | POA: Diagnosis not present

## 2024-11-12 DIAGNOSIS — H35033 Hypertensive retinopathy, bilateral: Secondary | ICD-10-CM

## 2024-11-12 DIAGNOSIS — Z7901 Long term (current) use of anticoagulants: Secondary | ICD-10-CM

## 2024-11-12 LAB — POCT INR: INR: 3.4 — AB (ref 2.0–3.0)

## 2024-11-14 ENCOUNTER — Other Ambulatory Visit (INDEPENDENT_AMBULATORY_CARE_PROVIDER_SITE_OTHER): Payer: Self-pay

## 2024-12-03 ENCOUNTER — Ambulatory Visit

## 2024-12-10 ENCOUNTER — Encounter (INDEPENDENT_AMBULATORY_CARE_PROVIDER_SITE_OTHER): Admitting: Ophthalmology

## 2024-12-26 ENCOUNTER — Ambulatory Visit

## 2024-12-30 ENCOUNTER — Ambulatory Visit: Admitting: Pulmonary Disease

## 2025-03-27 ENCOUNTER — Ambulatory Visit: Admitting: Cardiology
